# Patient Record
Sex: Female | Born: 1980 | Race: Black or African American | Hispanic: No | Smoking: Never smoker
Health system: Southern US, Community
[De-identification: ages and names within clinical notes are randomized; demographics above are authoritative.]

## PROBLEM LIST (undated history)

## (undated) DIAGNOSIS — Z801 Family history of malignant neoplasm of trachea, bronchus and lung: Secondary | ICD-10-CM

## (undated) DIAGNOSIS — M7989 Other specified soft tissue disorders: Secondary | ICD-10-CM

## (undated) DIAGNOSIS — J45909 Unspecified asthma, uncomplicated: Secondary | ICD-10-CM

## (undated) DIAGNOSIS — T783XXA Angioneurotic edema, initial encounter: Secondary | ICD-10-CM

## (undated) DIAGNOSIS — F419 Anxiety disorder, unspecified: Secondary | ICD-10-CM

## (undated) DIAGNOSIS — G43909 Migraine, unspecified, not intractable, without status migrainosus: Secondary | ICD-10-CM

## (undated) DIAGNOSIS — F329 Major depressive disorder, single episode, unspecified: Secondary | ICD-10-CM

## (undated) DIAGNOSIS — N649 Disorder of breast, unspecified: Secondary | ICD-10-CM

## (undated) DIAGNOSIS — Z803 Family history of malignant neoplasm of breast: Secondary | ICD-10-CM

## (undated) DIAGNOSIS — E739 Lactose intolerance, unspecified: Secondary | ICD-10-CM

## (undated) DIAGNOSIS — Z87448 Personal history of other diseases of urinary system: Secondary | ICD-10-CM

## (undated) DIAGNOSIS — T7840XA Allergy, unspecified, initial encounter: Secondary | ICD-10-CM

## (undated) DIAGNOSIS — M2550 Pain in unspecified joint: Secondary | ICD-10-CM

## (undated) DIAGNOSIS — N19 Unspecified kidney failure: Secondary | ICD-10-CM

## (undated) DIAGNOSIS — L509 Urticaria, unspecified: Secondary | ICD-10-CM

## (undated) DIAGNOSIS — I8393 Asymptomatic varicose veins of bilateral lower extremities: Secondary | ICD-10-CM

## (undated) DIAGNOSIS — K589 Irritable bowel syndrome without diarrhea: Secondary | ICD-10-CM

## (undated) DIAGNOSIS — E8881 Metabolic syndrome: Secondary | ICD-10-CM

## (undated) DIAGNOSIS — K219 Gastro-esophageal reflux disease without esophagitis: Secondary | ICD-10-CM

## (undated) DIAGNOSIS — M170 Bilateral primary osteoarthritis of knee: Secondary | ICD-10-CM

## (undated) DIAGNOSIS — E282 Polycystic ovarian syndrome: Secondary | ICD-10-CM

## (undated) DIAGNOSIS — E88819 Insulin resistance, unspecified: Secondary | ICD-10-CM

## (undated) DIAGNOSIS — Z8051 Family history of malignant neoplasm of kidney: Secondary | ICD-10-CM

## (undated) DIAGNOSIS — F32A Depression, unspecified: Secondary | ICD-10-CM

## (undated) DIAGNOSIS — Z8042 Family history of malignant neoplasm of prostate: Secondary | ICD-10-CM

## (undated) DIAGNOSIS — C50919 Malignant neoplasm of unspecified site of unspecified female breast: Secondary | ICD-10-CM

## (undated) HISTORY — DX: Insulin resistance, unspecified: E88.819

## (undated) HISTORY — DX: Metabolic syndrome: E88.81

## (undated) HISTORY — PX: COSMETIC SURGERY: SHX468

## (undated) HISTORY — DX: Family history of malignant neoplasm of breast: Z80.3

## (undated) HISTORY — DX: Allergy, unspecified, initial encounter: T78.40XA

## (undated) HISTORY — DX: Unspecified asthma, uncomplicated: J45.909

## (undated) HISTORY — DX: Asymptomatic varicose veins of bilateral lower extremities: I83.93

## (undated) HISTORY — PX: ENDOMETRIAL ABLATION: SHX621

## (undated) HISTORY — DX: Polycystic ovarian syndrome: E28.2

## (undated) HISTORY — DX: Family history of malignant neoplasm of kidney: Z80.51

## (undated) HISTORY — DX: Migraine, unspecified, not intractable, without status migrainosus: G43.909

## (undated) HISTORY — DX: Unspecified kidney failure: N19

## (undated) HISTORY — DX: Lactose intolerance, unspecified: E73.9

## (undated) HISTORY — DX: Malignant neoplasm of unspecified site of unspecified female breast: C50.919

## (undated) HISTORY — DX: Bilateral primary osteoarthritis of knee: M17.0

## (undated) HISTORY — DX: Pain in unspecified joint: M25.50

## (undated) HISTORY — PX: PLANTAR FASCIA SURGERY: SHX746

## (undated) HISTORY — DX: Family history of malignant neoplasm of trachea, bronchus and lung: Z80.1

## (undated) HISTORY — DX: Angioneurotic edema, initial encounter: T78.3XXA

## (undated) HISTORY — DX: Urticaria, unspecified: L50.9

## (undated) HISTORY — DX: Family history of malignant neoplasm of prostate: Z80.42

## (undated) HISTORY — DX: Other specified soft tissue disorders: M79.89

## (undated) HISTORY — PX: OTHER SURGICAL HISTORY: SHX169

## (undated) HISTORY — DX: Disorder of breast, unspecified: N64.9

## (undated) HISTORY — DX: Irritable bowel syndrome, unspecified: K58.9

## (undated) HISTORY — PX: REDUCTION MAMMAPLASTY: SUR839

---
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
## 2007-04-24 HISTORY — PX: BREAST LUMPECTOMY: SHX2

## 2007-05-28 ENCOUNTER — Encounter: Admission: RE | Admit: 2007-05-28 | Discharge: 2007-05-28 | Payer: Self-pay | Admitting: Unknown Physician Specialty

## 2007-06-04 ENCOUNTER — Ambulatory Visit: Payer: Self-pay | Admitting: Oncology

## 2007-06-05 LAB — CBC WITH DIFFERENTIAL/PLATELET
BASO%: 0.5 % (ref 0.0–2.0)
EOS%: 3.3 % (ref 0.0–7.0)
HCT: 39 % (ref 34.8–46.6)
MCH: 27.5 pg (ref 26.0–34.0)
MCHC: 33.8 g/dL (ref 32.0–36.0)
NEUT%: 47.2 % (ref 39.6–76.8)
RDW: 12.9 % (ref 11.3–14.5)
lymph#: 2.4 10*3/uL (ref 0.9–3.3)

## 2007-06-05 LAB — COMPREHENSIVE METABOLIC PANEL
ALT: 18 U/L (ref 0–35)
AST: 18 U/L (ref 0–37)
Calcium: 9.6 mg/dL (ref 8.4–10.5)
Chloride: 106 mEq/L (ref 96–112)
Creatinine, Ser: 1.03 mg/dL (ref 0.40–1.20)

## 2007-06-05 LAB — CANCER ANTIGEN 27.29: CA 27.29: 19 U/mL (ref 0–39)

## 2007-06-06 LAB — VITAMIN D 25 HYDROXY (VIT D DEFICIENCY, FRACTURES): Vit D, 25-Hydroxy: 19 ng/mL — ABNORMAL LOW (ref 30–89)

## 2007-06-17 ENCOUNTER — Ambulatory Visit (HOSPITAL_BASED_OUTPATIENT_CLINIC_OR_DEPARTMENT_OTHER): Admission: RE | Admit: 2007-06-17 | Discharge: 2007-06-17 | Payer: Self-pay | Admitting: General Surgery

## 2007-06-17 ENCOUNTER — Encounter (INDEPENDENT_AMBULATORY_CARE_PROVIDER_SITE_OTHER): Payer: Self-pay | Admitting: General Surgery

## 2007-06-22 ENCOUNTER — Ambulatory Visit: Payer: Self-pay | Admitting: Oncology

## 2007-07-25 ENCOUNTER — Ambulatory Visit: Admission: RE | Admit: 2007-07-25 | Discharge: 2007-10-23 | Payer: Self-pay | Admitting: Radiation Oncology

## 2007-09-17 ENCOUNTER — Ambulatory Visit: Payer: Self-pay | Admitting: Oncology

## 2007-10-27 ENCOUNTER — Ambulatory Visit: Payer: Self-pay | Admitting: Oncology

## 2007-10-27 LAB — LACTATE DEHYDROGENASE: LDH: 165 U/L (ref 94–250)

## 2007-10-27 LAB — CBC WITH DIFFERENTIAL/PLATELET
BASO%: 0.7 % (ref 0.0–2.0)
EOS%: 3.3 % (ref 0.0–7.0)
HCT: 38.3 % (ref 34.8–46.6)
MCH: 27.7 pg (ref 26.0–34.0)
MCHC: 34.7 g/dL (ref 32.0–36.0)
MONO#: 0.4 10*3/uL (ref 0.1–0.9)
NEUT%: 53.9 % (ref 39.6–76.8)
RBC: 4.81 10*6/uL (ref 3.70–5.32)
RDW: 12.6 % (ref 11.3–14.5)
WBC: 4 10*3/uL (ref 3.9–10.0)
lymph#: 1.3 10*3/uL (ref 0.9–3.3)

## 2007-10-27 LAB — COMPREHENSIVE METABOLIC PANEL
BUN: 12 mg/dL (ref 6–23)
CO2: 23 mEq/L (ref 19–32)
Calcium: 9 mg/dL (ref 8.4–10.5)
Chloride: 104 mEq/L (ref 96–112)
Creatinine, Ser: 0.97 mg/dL (ref 0.40–1.20)
Glucose, Bld: 91 mg/dL (ref 70–99)

## 2007-11-04 LAB — ESTRADIOL, ULTRA SENS

## 2007-11-25 LAB — LACTATE DEHYDROGENASE: LDH: 148 U/L (ref 94–250)

## 2007-11-25 LAB — CBC WITH DIFFERENTIAL/PLATELET
BASO%: 0.1 % (ref 0.0–2.0)
Eosinophils Absolute: 0.1 10*3/uL (ref 0.0–0.5)
MCHC: 34.4 g/dL (ref 32.0–36.0)
MONO#: 0.4 10*3/uL (ref 0.1–0.9)
NEUT#: 1.9 10*3/uL (ref 1.5–6.5)
Platelets: 336 10*3/uL (ref 145–400)
RBC: 4.64 10*6/uL (ref 3.70–5.32)
WBC: 3.6 10*3/uL — ABNORMAL LOW (ref 3.9–10.0)
lymph#: 1.3 10*3/uL (ref 0.9–3.3)

## 2007-11-25 LAB — FOLLICLE STIMULATING HORMONE: FSH: 5.3 m[IU]/mL

## 2007-11-25 LAB — COMPREHENSIVE METABOLIC PANEL
ALT: 21 U/L (ref 0–35)
BUN: 13 mg/dL (ref 6–23)
CO2: 25 mEq/L (ref 19–32)
Calcium: 9.2 mg/dL (ref 8.4–10.5)
Chloride: 104 mEq/L (ref 96–112)
Creatinine, Ser: 1.01 mg/dL (ref 0.40–1.20)
Total Bilirubin: 1 mg/dL (ref 0.3–1.2)

## 2007-12-06 LAB — ESTRADIOL, ULTRA SENS

## 2007-12-23 ENCOUNTER — Ambulatory Visit: Payer: Self-pay | Admitting: Oncology

## 2008-01-26 LAB — CBC WITH DIFFERENTIAL/PLATELET
BASO%: 0.3 % (ref 0.0–2.0)
Basophils Absolute: 0 10*3/uL (ref 0.0–0.1)
EOS%: 3.4 % (ref 0.0–7.0)
HCT: 36.7 % (ref 34.8–46.6)
HGB: 12.7 g/dL (ref 11.6–15.9)
LYMPH%: 32.6 % (ref 14.0–48.0)
MCH: 27.8 pg (ref 26.0–34.0)
MCHC: 34.7 g/dL (ref 32.0–36.0)
NEUT%: 51.8 % (ref 39.6–76.8)
Platelets: 324 10*3/uL (ref 145–400)

## 2008-01-26 LAB — COMPREHENSIVE METABOLIC PANEL
ALT: 18 U/L (ref 0–35)
AST: 17 U/L (ref 0–37)
BUN: 15 mg/dL (ref 6–23)
CO2: 24 mEq/L (ref 19–32)
Calcium: 9.3 mg/dL (ref 8.4–10.5)
Chloride: 102 mEq/L (ref 96–112)
Creatinine, Ser: 1.03 mg/dL (ref 0.40–1.20)
Total Bilirubin: 0.7 mg/dL (ref 0.3–1.2)

## 2008-01-26 LAB — FOLLICLE STIMULATING HORMONE: FSH: 3 m[IU]/mL

## 2008-01-27 ENCOUNTER — Ambulatory Visit: Payer: Self-pay | Admitting: Vascular Surgery

## 2008-01-27 ENCOUNTER — Encounter: Payer: Self-pay | Admitting: Oncology

## 2008-01-27 ENCOUNTER — Ambulatory Visit: Admission: RE | Admit: 2008-01-27 | Discharge: 2008-01-27 | Payer: Self-pay | Admitting: Oncology

## 2008-02-09 LAB — ESTRADIOL, ULTRA SENS

## 2008-02-19 ENCOUNTER — Ambulatory Visit: Payer: Self-pay | Admitting: Oncology

## 2008-02-24 LAB — CBC WITH DIFFERENTIAL/PLATELET
Basophils Absolute: 0 10*3/uL (ref 0.0–0.1)
Eosinophils Absolute: 0.1 10*3/uL (ref 0.0–0.5)
HCT: 37.9 % (ref 34.8–46.6)
HGB: 12.9 g/dL (ref 11.6–15.9)
LYMPH%: 31 % (ref 14.0–48.0)
MONO#: 0.4 10*3/uL (ref 0.1–0.9)
NEUT#: 2.5 10*3/uL (ref 1.5–6.5)
NEUT%: 57.6 % (ref 39.6–76.8)
Platelets: 339 10*3/uL (ref 145–400)
WBC: 4.4 10*3/uL (ref 3.9–10.0)

## 2008-02-24 LAB — COMPREHENSIVE METABOLIC PANEL
CO2: 25 mEq/L (ref 19–32)
Calcium: 9.2 mg/dL (ref 8.4–10.5)
Creatinine, Ser: 1.01 mg/dL (ref 0.40–1.20)
Glucose, Bld: 88 mg/dL (ref 70–99)
Total Bilirubin: 0.7 mg/dL (ref 0.3–1.2)

## 2008-02-24 LAB — CANCER ANTIGEN 27.29: CA 27.29: 19 U/mL (ref 0–39)

## 2008-03-16 LAB — CBC WITH DIFFERENTIAL/PLATELET
BASO%: 0.4 % (ref 0.0–2.0)
Basophils Absolute: 0 10*3/uL (ref 0.0–0.1)
HCT: 37.7 % (ref 34.8–46.6)
HGB: 13 g/dL (ref 11.6–15.9)
MCHC: 34.5 g/dL (ref 32.0–36.0)
MONO#: 0.5 10*3/uL (ref 0.1–0.9)
NEUT%: 53.6 % (ref 39.6–76.8)
RDW: 13.8 % (ref 11.3–14.5)
WBC: 4.4 10*3/uL (ref 3.9–10.0)
lymph#: 1.4 10*3/uL (ref 0.9–3.3)

## 2008-03-16 LAB — COMPREHENSIVE METABOLIC PANEL
ALT: 18 U/L (ref 0–35)
AST: 18 U/L (ref 0–37)
Albumin: 4.3 g/dL (ref 3.5–5.2)
CO2: 24 mEq/L (ref 19–32)
Calcium: 9.3 mg/dL (ref 8.4–10.5)
Chloride: 103 mEq/L (ref 96–112)
Creatinine, Ser: 0.96 mg/dL (ref 0.40–1.20)
Potassium: 4.4 mEq/L (ref 3.5–5.3)
Total Protein: 7.6 g/dL (ref 6.0–8.3)

## 2008-04-07 ENCOUNTER — Ambulatory Visit: Payer: Self-pay | Admitting: Oncology

## 2008-06-10 ENCOUNTER — Ambulatory Visit: Payer: Self-pay | Admitting: Oncology

## 2008-06-14 ENCOUNTER — Encounter: Admission: RE | Admit: 2008-06-14 | Discharge: 2008-06-14 | Payer: Self-pay | Admitting: Unknown Physician Specialty

## 2008-12-06 ENCOUNTER — Ambulatory Visit: Payer: Self-pay | Admitting: Oncology

## 2008-12-06 LAB — CBC WITH DIFFERENTIAL/PLATELET
BASO%: 0.4 % (ref 0.0–2.0)
Basophils Absolute: 0 10*3/uL (ref 0.0–0.1)
EOS%: 3 % (ref 0.0–7.0)
HCT: 34.2 % — ABNORMAL LOW (ref 34.8–46.6)
HGB: 11.7 g/dL (ref 11.6–15.9)
LYMPH%: 31 % (ref 14.0–49.7)
MCH: 27.5 pg (ref 25.1–34.0)
MCHC: 34.2 g/dL (ref 31.5–36.0)
MCV: 80.5 fL (ref 79.5–101.0)
NEUT%: 52.1 % (ref 38.4–76.8)
Platelets: 345 10*3/uL (ref 145–400)

## 2008-12-07 LAB — CANCER ANTIGEN 27.29: CA 27.29: 20 U/mL (ref 0–39)

## 2008-12-07 LAB — COMPREHENSIVE METABOLIC PANEL
ALT: 17 U/L (ref 0–35)
AST: 24 U/L (ref 0–37)
BUN: 13 mg/dL (ref 6–23)
Calcium: 9.1 mg/dL (ref 8.4–10.5)
Chloride: 106 mEq/L (ref 96–112)
Creatinine, Ser: 0.99 mg/dL (ref 0.40–1.20)
Total Bilirubin: 0.8 mg/dL (ref 0.3–1.2)

## 2008-12-07 LAB — LUTEINIZING HORMONE: LH: 12.8 m[IU]/mL

## 2008-12-07 LAB — VITAMIN D 25 HYDROXY (VIT D DEFICIENCY, FRACTURES): Vit D, 25-Hydroxy: 20 ng/mL — ABNORMAL LOW (ref 30–89)

## 2008-12-15 LAB — ESTRADIOL, ULTRA SENS

## 2009-02-02 ENCOUNTER — Ambulatory Visit: Payer: Self-pay | Admitting: Oncology

## 2009-03-03 ENCOUNTER — Ambulatory Visit: Payer: Self-pay | Admitting: Oncology

## 2009-04-05 ENCOUNTER — Ambulatory Visit: Payer: Self-pay | Admitting: Oncology

## 2009-05-02 ENCOUNTER — Ambulatory Visit: Payer: Self-pay | Admitting: Oncology

## 2009-06-02 ENCOUNTER — Ambulatory Visit: Payer: Self-pay | Admitting: Oncology

## 2009-06-06 LAB — COMPREHENSIVE METABOLIC PANEL
AST: 23 U/L (ref 0–37)
Albumin: 3.6 g/dL (ref 3.5–5.2)
Alkaline Phosphatase: 46 U/L (ref 39–117)
Potassium: 3.5 mEq/L (ref 3.5–5.3)
Sodium: 141 mEq/L (ref 135–145)
Total Protein: 7.5 g/dL (ref 6.0–8.3)

## 2009-06-06 LAB — CBC WITH DIFFERENTIAL/PLATELET
BASO%: 0.4 % (ref 0.0–2.0)
EOS%: 2.5 % (ref 0.0–7.0)
MCH: 26.9 pg (ref 25.1–34.0)
MCHC: 33.4 g/dL (ref 31.5–36.0)
RBC: 4.69 10*6/uL (ref 3.70–5.45)
RDW: 13.6 % (ref 11.2–14.5)
lymph#: 1.8 10*3/uL (ref 0.9–3.3)

## 2009-06-06 LAB — VITAMIN D 25 HYDROXY (VIT D DEFICIENCY, FRACTURES): Vit D, 25-Hydroxy: 23 ng/mL — ABNORMAL LOW (ref 30–89)

## 2009-06-11 LAB — ESTRADIOL, ULTRA SENS

## 2009-06-24 ENCOUNTER — Encounter: Admission: RE | Admit: 2009-06-24 | Discharge: 2009-06-24 | Payer: Self-pay | Admitting: Oncology

## 2009-06-30 ENCOUNTER — Ambulatory Visit: Payer: Self-pay | Admitting: Oncology

## 2009-07-04 ENCOUNTER — Ambulatory Visit (HOSPITAL_COMMUNITY): Admission: RE | Admit: 2009-07-04 | Discharge: 2009-07-04 | Payer: Self-pay | Admitting: Oncology

## 2009-08-02 ENCOUNTER — Ambulatory Visit: Payer: Self-pay | Admitting: Oncology

## 2009-09-01 ENCOUNTER — Ambulatory Visit: Payer: Self-pay | Admitting: Oncology

## 2009-10-11 ENCOUNTER — Ambulatory Visit: Payer: Self-pay | Admitting: Oncology

## 2009-11-28 ENCOUNTER — Ambulatory Visit: Payer: Self-pay | Admitting: Oncology

## 2009-11-30 LAB — COMPREHENSIVE METABOLIC PANEL
AST: 31 U/L (ref 0–37)
BUN: 20 mg/dL (ref 6–23)
CO2: 30 mEq/L (ref 19–32)
Calcium: 9.4 mg/dL (ref 8.4–10.5)
Chloride: 105 mEq/L (ref 96–112)
Creatinine, Ser: 1.21 mg/dL — ABNORMAL HIGH (ref 0.40–1.20)

## 2009-11-30 LAB — LACTATE DEHYDROGENASE: LDH: 148 U/L (ref 94–250)

## 2009-11-30 LAB — CBC WITH DIFFERENTIAL/PLATELET
Basophils Absolute: 0 10*3/uL (ref 0.0–0.1)
EOS%: 1.9 % (ref 0.0–7.0)
HCT: 37.9 % (ref 34.8–46.6)
HGB: 12.8 g/dL (ref 11.6–15.9)
MCH: 27.9 pg (ref 25.1–34.0)
MCV: 82.9 fL (ref 79.5–101.0)
NEUT%: 46.7 % (ref 38.4–76.8)
lymph#: 1.9 10*3/uL (ref 0.9–3.3)

## 2009-12-01 LAB — VITAMIN D 25 HYDROXY (VIT D DEFICIENCY, FRACTURES): Vit D, 25-Hydroxy: 43 ng/mL (ref 30–89)

## 2009-12-10 LAB — ESTRADIOL, ULTRA SENS: Estradiol, Ultra Sensitive: 25 pg/mL

## 2010-01-03 ENCOUNTER — Ambulatory Visit: Payer: Self-pay | Admitting: Oncology

## 2010-02-03 ENCOUNTER — Encounter: Admission: RE | Admit: 2010-02-03 | Discharge: 2010-02-03 | Payer: Self-pay | Admitting: Oncology

## 2010-03-01 ENCOUNTER — Ambulatory Visit: Payer: Self-pay | Admitting: Oncology

## 2010-03-10 LAB — CBC WITH DIFFERENTIAL/PLATELET
Eosinophils Absolute: 0.1 10*3/uL (ref 0.0–0.5)
HCT: 36.4 % (ref 34.8–46.6)
LYMPH%: 42.9 % (ref 14.0–49.7)
MONO#: 0.3 10*3/uL (ref 0.1–0.9)
NEUT#: 1.5 10*3/uL (ref 1.5–6.5)
Platelets: 351 10*3/uL (ref 145–400)
RBC: 4.42 10*6/uL (ref 3.70–5.45)
WBC: 3.4 10*3/uL — ABNORMAL LOW (ref 3.9–10.3)
lymph#: 1.5 10*3/uL (ref 0.9–3.3)

## 2010-03-10 LAB — COMPREHENSIVE METABOLIC PANEL
ALT: 16 U/L (ref 0–35)
AST: 19 U/L (ref 0–37)
Albumin: 3.6 g/dL (ref 3.5–5.2)
CO2: 28 mEq/L (ref 19–32)
Calcium: 9.4 mg/dL (ref 8.4–10.5)
Chloride: 106 mEq/L (ref 96–112)
Creatinine, Ser: 1.06 mg/dL (ref 0.40–1.20)
Potassium: 4.2 mEq/L (ref 3.5–5.3)
Total Protein: 7.4 g/dL (ref 6.0–8.3)

## 2010-03-10 LAB — LACTATE DEHYDROGENASE: LDH: 137 U/L (ref 94–250)

## 2010-03-11 LAB — FOLLICLE STIMULATING HORMONE: FSH: 4.7 m[IU]/mL

## 2010-05-14 ENCOUNTER — Encounter: Payer: Self-pay | Admitting: Unknown Physician Specialty

## 2010-06-28 ENCOUNTER — Other Ambulatory Visit: Payer: Self-pay | Admitting: Oncology

## 2010-06-28 DIAGNOSIS — Z9889 Other specified postprocedural states: Secondary | ICD-10-CM

## 2010-07-12 ENCOUNTER — Ambulatory Visit (INDEPENDENT_AMBULATORY_CARE_PROVIDER_SITE_OTHER): Payer: 59 | Admitting: Psychology

## 2010-07-12 DIAGNOSIS — F988 Other specified behavioral and emotional disorders with onset usually occurring in childhood and adolescence: Secondary | ICD-10-CM

## 2010-08-02 ENCOUNTER — Encounter (HOSPITAL_COMMUNITY): Payer: 59 | Admitting: Psychology

## 2010-09-05 NOTE — Op Note (Signed)
NAME:  Adriana Moon, Adriana Moon          ACCOUNT NO.:  [id number]   MEDICAL RECORD NO.:  [id number]          PATIENT TYPE:  AMB   LOCATION:  DSC                          FACILITY:  [facility]   PHYSICIAN:  Lennie Muckle, MD      DATE OF BIRTH:  [birth year]   DATE OF PROCEDURE:  06/17/2007  DATE OF DISCHARGE:                               OPERATIVE REPORT   PREOPERATIVE DIAGNOSIS:  Right breast cancer.   POSTOPERATIVE DIAGNOSIS:  Right breast cancer.   PROCEDURE:  Right breast lumpectomy with sentinel node dissection.   SURGEON:  Lennie Muckle, M.D.   ASSISTANT:  None.   ANESTHESIA:  General endotracheal anesthesia.   INDICATIONS FOR PROCEDURE:  Adriana Moon is a 30 year old female who  self-detected a right breast mass.  A core biopsy revealed a right  breast cancer.  It was discussed with the patient to perform a  lumpectomy as well as a sentinel lymph node for final pathology, to  coordinate her treatment regimen.  An informed consent was obtained  prior to the procedure.   DESCRIPTION OF PROCEDURE:  Adriana Moon was identified in the  preoperative holding suite.  Her operative site was marked.  She was  also injected with a radio tracer in the holding area.  She was then  taken to the operating room and placed in a supine position.  After the  administration of general endotracheal anesthesia, her right breast and  axilla were prepped and draped in the usual sterile fashion.  A time out  in the procedure indicating the patient and the procedure was performed.  Using 3 mL of methylene blue, I injected just beneath the area outlined,  to the vicinity of the mass.  The area was massaged for approximately  five minutes.  Using the probe in the area of the axilla was marked.  The skin was incised with a #15 blade.  The subcutaneous tissue was  divided with electrocautery.  Several lymph nodes were able to be  identified using the methylene blue and the radioactive material.  Approximately eight nodes were dissected.  The #1 node was noted to be  1977, was blue and hot.  The lymph node #2 was 212 and was also blue.  Lymph node #3 was 58 and lymph node #4 was 1607 and was also hot and  blue.  Lymph node #5 was 317, lymph node #6 was 775, lymph node was  #287, lymph node #8 was 165.  The axilla count was less than 150 on the  endo device.  The axilla was then irrigated.  No evidence of bleeding.  This was closed in an interrupted fashion using #3-0 Vicryl sutures.  The skin was closed with #4-0 Monocryl.  Steri-Strips were placed as a  final dressing.   I then turned my attention to the right breast mass.  This was easily  palpated in the lateral aspect of her breast.  Marking the incision  directly over this vicinity, I placed a lateral incision.  The  subcutaneous tissues were divided with electrocautery.  I then  encompassed the mass in the lumpectomy using  electrocautery to maintain  hemostasis.  I marked the anterior portion with a double short suture.  A single was marked superior and lateral with a long suture.  Once the  specimen was turned loose from the breast, it was passed off the  operative field.  The breast was then irrigated.  The bleeding was  controlled with electrocautery.  After irrigation and further inspecting  the breast tissue, there was no evidence of bleeding.  It was then  closed in an interrupted fashion using #3-0 Vicryl suture.  The skin was  closed with #4-0 Monocryl.  Steri-Strips were placed and a final  dressing.  Approximately 30 mL of 0.25% Marcaine were anesthetized into  the axilla and the breast.   The patient was then extubated and transported to the post-anesthesia  care unit in stable condition.   DISPOSITION:  She will come to see me in approximately two or three  weeks.  I will call with her pathology prior to that.      Lennie Muckle, MD  Electronically Signed     ALA/MEDQ  D:  06/17/2007  T:  06/17/2007   Job:  [id number]

## 2011-01-15 LAB — COMPREHENSIVE METABOLIC PANEL
AST: 27
Albumin: 3.9
Calcium: 9
Creatinine, Ser: 0.96
GFR calc Af Amer: >60

## 2011-01-15 LAB — URINALYSIS, ROUTINE W REFLEX MICROSCOPIC
Bilirubin Urine: NEGATIVE
Glucose, UA: NEGATIVE
Ketones, ur: NEGATIVE
Leukocytes, UA: NEGATIVE
pH: 6

## 2011-01-15 LAB — CBC
Hemoglobin: 13.2
MCHC: 34.1
RBC: 4.71
WBC: 5.5

## 2011-01-15 LAB — DIFFERENTIAL
Eosinophils Relative: 1
Lymphocytes Relative: 29
Lymphs Abs: 1.6
Monocytes Absolute: 0.5
Monocytes Relative: 10

## 2011-01-15 LAB — URINE MICROSCOPIC-ADD ON

## 2011-09-05 ENCOUNTER — Other Ambulatory Visit: Payer: Self-pay | Admitting: Oncology

## 2011-09-05 DIAGNOSIS — Z9889 Other specified postprocedural states: Secondary | ICD-10-CM

## 2011-09-05 DIAGNOSIS — Z853 Personal history of malignant neoplasm of breast: Secondary | ICD-10-CM

## 2011-12-07 ENCOUNTER — Other Ambulatory Visit: Payer: Self-pay | Admitting: Oncology

## 2011-12-07 ENCOUNTER — Ambulatory Visit
Admission: RE | Admit: 2011-12-07 | Discharge: 2011-12-07 | Disposition: A | Discharge status: Home / Self Care | Payer: 59 | Source: Ambulatory Visit | Attending: Oncology | Admitting: Oncology

## 2011-12-07 DIAGNOSIS — Z9889 Other specified postprocedural states: Secondary | ICD-10-CM

## 2011-12-07 DIAGNOSIS — Z853 Personal history of malignant neoplasm of breast: Secondary | ICD-10-CM

## 2012-09-16 ENCOUNTER — Other Ambulatory Visit: Payer: Self-pay | Admitting: Unknown Physician Specialty

## 2012-09-16 DIAGNOSIS — Z853 Personal history of malignant neoplasm of breast: Secondary | ICD-10-CM

## 2012-11-27 ENCOUNTER — Encounter: Payer: Self-pay | Admitting: Gastroenterology

## 2012-12-24 ENCOUNTER — Ambulatory Visit: Payer: 59 | Admitting: Gastroenterology

## 2013-01-14 ENCOUNTER — Ambulatory Visit
Admission: RE | Admit: 2013-01-14 | Discharge: 2013-01-14 | Disposition: A | Discharge status: Home / Self Care | Payer: 59 | Source: Ambulatory Visit | Attending: Unknown Physician Specialty | Admitting: Unknown Physician Specialty

## 2013-01-14 DIAGNOSIS — Z853 Personal history of malignant neoplasm of breast: Secondary | ICD-10-CM

## 2013-02-05 ENCOUNTER — Telehealth: Payer: Self-pay | Admitting: Gastroenterology

## 2013-02-05 ENCOUNTER — Ambulatory Visit: Payer: 59 | Admitting: Gastroenterology

## 2013-02-05 NOTE — Telephone Encounter (Signed)
Pt was a no show

## 2014-06-10 ENCOUNTER — Other Ambulatory Visit: Payer: Self-pay

## 2014-06-10 DIAGNOSIS — Z1231 Encounter for screening mammogram for malignant neoplasm of breast: Secondary | ICD-10-CM

## 2014-06-24 ENCOUNTER — Other Ambulatory Visit: Payer: Self-pay

## 2014-06-24 ENCOUNTER — Ambulatory Visit
Admission: RE | Admit: 2014-06-24 | Discharge: 2014-06-24 | Disposition: A | Discharge status: Home / Self Care | Payer: BLUE CROSS/BLUE SHIELD | Source: Ambulatory Visit

## 2014-06-24 DIAGNOSIS — Z9889 Other specified postprocedural states: Secondary | ICD-10-CM

## 2014-06-24 DIAGNOSIS — Z1231 Encounter for screening mammogram for malignant neoplasm of breast: Secondary | ICD-10-CM

## 2014-09-10 ENCOUNTER — Other Ambulatory Visit: Payer: Self-pay | Admitting: Adult Health

## 2014-09-28 ENCOUNTER — Other Ambulatory Visit: Payer: Self-pay | Admitting: Adult Health

## 2014-10-20 ENCOUNTER — Other Ambulatory Visit: Payer: Self-pay | Admitting: Adult Health

## 2014-10-22 ENCOUNTER — Ambulatory Visit (INDEPENDENT_AMBULATORY_CARE_PROVIDER_SITE_OTHER): Payer: BLUE CROSS/BLUE SHIELD | Admitting: Obstetrics & Gynecology

## 2014-10-22 ENCOUNTER — Other Ambulatory Visit (HOSPITAL_COMMUNITY)
Admission: RE | Admit: 2014-10-22 | Discharge: 2014-10-22 | Disposition: A | Discharge status: Home / Self Care | Payer: BLUE CROSS/BLUE SHIELD | Source: Ambulatory Visit | Attending: Obstetrics & Gynecology | Admitting: Obstetrics & Gynecology

## 2014-10-22 ENCOUNTER — Encounter: Payer: Self-pay | Admitting: Obstetrics & Gynecology

## 2014-10-22 VITALS — BP 108/60 | HR 72 | Ht 60.0 in | Wt 270.4 lb

## 2014-10-22 DIAGNOSIS — Z01419 Encounter for gynecological examination (general) (routine) without abnormal findings: Secondary | ICD-10-CM

## 2014-10-22 DIAGNOSIS — Z1151 Encounter for screening for human papillomavirus (HPV): Secondary | ICD-10-CM | POA: Insufficient documentation

## 2014-10-22 NOTE — Progress Notes (Signed)
Patient ID: Adriana Moon, female   DOB: 15-Jan-1981, 34 y.o.   MRN: [id number] Subjective:     Adriana Moon is a 34 y.o. female here for a routine exam.  Patient's last menstrual period was 09/26/2014. No obstetric history on file. Birth Control Method:  Ablation otherwise negative Menstrual Calendar(currently): regular  Current complaints: none.   Current acute medical issues:  Breast cancer 2011   Recent Gynecologic History Patient's last menstrual period was 09/26/2014. Last Pap: 2015,  normal Last mammogram: 2016,  normal  Past Medical History  Diagnosis Date  . Arthritis of both knees   breast cancer  Past Surgical History  Procedure Laterality Date  . Cesarean section    . Rt lumpectomy      OB History    No data available      History   Social History  . Marital Status: Legally Separated    Spouse Name: N/A  . Number of Children: N/A  . Years of Education: N/A   Social History Main Topics  . 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 Smokeless tobacco: Not on file  . Alcohol Use: Not on file  . Drug Use: Not on file  . Sexual Activity: Not Currently   Other Topics Concern  . None   Social History Narrative  . None    Family History  Problem Relation Age of Onset  . Diabetes Father   . Hypertension Father      Current outpatient prescriptions:  .  cetirizine (ZYRTEC) 10 MG tablet, Take 10 mg by mouth daily., Disp: , Rfl:  .  pantoprazole (PROTONIX) 20 MG tablet, Take 20 mg by mouth daily., Disp: , Rfl:   Review of Systems  Review of Systems  Constitutional: Negative for fever, chills, weight loss, malaise/fatigue and diaphoresis.  HENT: Negative for hearing loss, ear pain, nosebleeds, congestion, sore throat, neck pain, tinnitus and ear discharge.   Eyes: Negative for blurred vision, double vision, photophobia, pain, discharge and redness.  Respiratory: Negative for cough, hemoptysis, sputum production, shortness of breath, wheezing and  stridor.   Cardiovascular: Negative for chest pain, palpitations, orthopnea, claudication, leg swelling and PND.  Gastrointestinal: negative for abdominal pain. Negative for heartburn, nausea, vomiting, diarrhea, constipation, blood in stool and melena.  Genitourinary: Negative for dysuria, urgency, frequency, hematuria and flank pain.  Musculoskeletal: Negative for myalgias, back pain, joint pain and falls.  Skin: Negative for itching and rash.  Neurological: Negative for dizziness, tingling, tremors, sensory change, speech change, focal weakness, seizures, loss of consciousness, weakness and headaches.  Endo/Heme/Allergies: Negative for environmental allergies and polydipsia. Does not bruise/bleed easily.  Psychiatric/Behavioral: Negative for depression, suicidal ideas, hallucinations, memory loss and substance abuse. The patient is not nervous/anxious and does not have insomnia.        Objective:  Blood pressure 108/60, pulse 72, height 5' (1.524 m), weight 270 lb 6.4 oz (122.653 kg), last menstrual period 09/26/2014.   Physical Exam  Vitals reviewed. Constitutional: She is oriented to person, place, and time. She appears well-developed and well-nourished.  HENT:  Head: Normocephalic and atraumatic.        Right Ear: External ear normal.  Left Ear: External ear normal.  Nose: Nose normal.  Mouth/Throat: Oropharynx is clear and moist.  Eyes: Conjunctivae and EOM are normal. Pupils are equal, round, and reactive to light. Right eye exhibits no discharge. Left eye exhibits no discharge. No scleral icterus.  Neck: Normal range of motion. Neck supple. No tracheal deviation present. No  thyromegaly present.  Cardiovascular: Normal rate, regular rhythm, normal heart sounds and intact distal pulses.  Exam reveals no gallop and no friction rub.   No murmur heard. Respiratory: Effort normal and breath sounds normal. No respiratory distress. She has no wheezes. She has no rales. She exhibits no  tenderness.  GI: Soft. Bowel sounds are normal. She exhibits no distension and no mass. There is no tenderness. There is no rebound and no guarding.  Genitourinary:  Breasts no masses skin changes or nipple changes bilaterally      Vulva is normal without lesions Vagina is pink moist without discharge Cervix normal in appearance and pap is done Uterus is normal size shape and contour Adnexa is negative with normal sized ovaries   Musculoskeletal: Normal range of motion. She exhibits no edema and no tenderness.  Neurological: She is alert and oriented to person, place, and time. She has normal reflexes. She displays normal reflexes. No cranial nerve deficit. She exhibits normal muscle tone. Coordination normal.  Skin: Skin is warm and dry. No rash noted. No erythema. No pallor.  Psychiatric: She has a normal mood and affect. Her behavior is normal. Judgment and thought content normal.       Assessment:    Healthy female exam.   history of breast cancer Plan:    Follow up in: 1 year.

## 2014-10-26 LAB — CYTOLOGY - PAP

## 2015-01-11 ENCOUNTER — Ambulatory Visit (INDEPENDENT_AMBULATORY_CARE_PROVIDER_SITE_OTHER): Payer: BLUE CROSS/BLUE SHIELD | Admitting: Advanced Practice Midwife

## 2015-01-11 ENCOUNTER — Encounter: Payer: Self-pay | Admitting: Advanced Practice Midwife

## 2015-01-11 VITALS — BP 112/70 | Ht 60.0 in | Wt 272.0 lb

## 2015-01-11 DIAGNOSIS — N898 Other specified noninflammatory disorders of vagina: Secondary | ICD-10-CM | POA: Diagnosis not present

## 2015-01-12 NOTE — Progress Notes (Signed)
[facility] Visit  Patient name: Adriana Moon MRN [id number]  Date of birth: 1980-08-14  CC & HPI:  Adriana Moon is a 34 y.o. African American female presenting today for c/o increased vaginal discharge for 2 days.  States that in the am "it looks like sour cream if I spread the lips".  No odor, itch, or irritaion.  Has douched a few times using apple cider vinegar.   Pertinent History Reviewed:  Medical & Surgical Hx:   Past Medical History  Diagnosis Date  . Arthritis of both knees   . Migraines   . Breast cancer    Past Surgical History  Procedure Laterality Date  . Cesarean section    . Rt lumpectomy     Family History  Problem Relation Age of Onset  . Diabetes Father   . Hypertension Father   . Gout Father     Current outpatient prescriptions:  .  cetirizine (ZYRTEC) 10 MG tablet, Take 10 mg by mouth daily., Disp: , Rfl:  .  pantoprazole (PROTONIX) 20 MG tablet, Take 20 mg by mouth daily., Disp: , Rfl:  Social History: Reviewed -  reports that she has never smoked. She has never used smokeless tobacco.  Review of Systems:   Constitutional: Negative for fever and chills Eyes: Negative for visual disturbances Respiratory: Negative for shortness of breath, dyspnea Cardiovascular: Negative for chest pain or palpitations  Gastrointestinal: Negative for vomiting, diarrhea and constipation; no abdominal pain Genitourinary: Negative for dysuria and urgency, vaginal irritation or itching Musculoskeletal: Negative for back pain, joint pain, myalgias  Neurological: Negative for dizziness and headaches    Objective Findings:  Vitals: BP 112/70 mmHg  Ht 5' (1.524 m)  Wt 272 lb (123.378 kg)  BMI 53.12 kg/m2  LMP 12/29/2014  Physical Examination:  General appearance - alert, well appearing, and in no distress Mental status - alert, oriented to person, place, and time Pelvic - Vulva normal. SSE: vaginal sidewalls normal, no erythema.  Cervix non  friable.  Scant amount of normal appearing vaginal discharge.  No odor.  Wet prep completely negative. No lactobaclli.  Musculoskeletal - no joint tenderness, deformity or swelling, full range of motion without pain Extremities - no pedal edema noted Heart:  Regular rate and rhythm Chest:  Normal respiratory effort  No results found for this or any previous visit (from the past 24 hour(s)).       Assessment & Plan:  A:   Non infectious discharge P:  RepHresh vaginal gel for 1 week.  Quit douching.   F/U prn problems    CRESENZO-DISHMAN,FRANCES CNM 01/12/2015 11:39 AM

## 2015-01-25 ENCOUNTER — Ambulatory Visit (HOSPITAL_BASED_OUTPATIENT_CLINIC_OR_DEPARTMENT_OTHER): Payer: BLUE CROSS/BLUE SHIELD | Admitting: Anesthesiology

## 2015-01-25 ENCOUNTER — Encounter (HOSPITAL_BASED_OUTPATIENT_CLINIC_OR_DEPARTMENT_OTHER): Payer: Self-pay

## 2015-01-25 ENCOUNTER — Ambulatory Visit (HOSPITAL_BASED_OUTPATIENT_CLINIC_OR_DEPARTMENT_OTHER)
Admission: RE | Admit: 2015-01-25 | Discharge: 2015-01-25 | Disposition: A | Discharge status: Home / Self Care | Payer: BLUE CROSS/BLUE SHIELD | Source: Ambulatory Visit | Attending: Plastic Surgery | Admitting: Plastic Surgery

## 2015-01-25 ENCOUNTER — Encounter (HOSPITAL_BASED_OUTPATIENT_CLINIC_OR_DEPARTMENT_OTHER)
Admission: RE | Disposition: A | Discharge status: Home / Self Care | Payer: Self-pay | Source: Ambulatory Visit | Attending: Plastic Surgery

## 2015-01-25 DIAGNOSIS — Z853 Personal history of malignant neoplasm of breast: Secondary | ICD-10-CM | POA: Diagnosis not present

## 2015-01-25 DIAGNOSIS — N62 Hypertrophy of breast: Secondary | ICD-10-CM | POA: Diagnosis not present

## 2015-01-25 HISTORY — PX: BREAST REDUCTION SURGERY: SHX8

## 2015-01-25 LAB — POCT HEMOGLOBIN-HEMACUE: HEMOGLOBIN: 15.2 g/dL — AB (ref 12.0–15.0)

## 2015-01-25 SURGERY — MAMMOPLASTY, REDUCTION
Anesthesia: General | Site: Breast | Laterality: Bilateral

## 2015-01-25 MED ORDER — ONDANSETRON HCL 4 MG/2ML IJ SOLN
INTRAMUSCULAR | Status: DC | PRN
  Administered 2015-01-25 (×2): 4 mg via INTRAVENOUS

## 2015-01-25 MED ORDER — LIDOCAINE-EPINEPHRINE 1 %-1:100000 IJ SOLN
INTRAMUSCULAR | Status: AC
  Filled 2015-01-25: qty 2

## 2015-01-25 MED ORDER — SUCCINYLCHOLINE CHLORIDE 20 MG/ML IJ SOLN
INTRAMUSCULAR | Status: DC | PRN
  Administered 2015-01-25: 100 mg via INTRAVENOUS

## 2015-01-25 MED ORDER — FENTANYL CITRATE (PF) 100 MCG/2ML IJ SOLN
INTRAMUSCULAR | Status: AC
  Filled 2015-01-25: qty 4

## 2015-01-25 MED ORDER — 0.9 % SODIUM CHLORIDE (POUR BTL) OPTIME
TOPICAL | Status: DC | PRN
  Administered 2015-01-25: 1800 mL

## 2015-01-25 MED ORDER — HYDROMORPHONE HCL 1 MG/ML IJ SOLN
INTRAMUSCULAR | Status: AC
  Filled 2015-01-25: qty 1

## 2015-01-25 MED ORDER — MIDAZOLAM HCL 2 MG/2ML IJ SOLN
1.0000 mg | INTRAMUSCULAR | Status: DC | PRN
Start: 2015-01-25 — End: 2015-01-25
  Administered 2015-01-25: 2 mg via INTRAVENOUS

## 2015-01-25 MED ORDER — PHENYLEPHRINE 40 MCG/ML (10ML) SYRINGE FOR IV PUSH (FOR BLOOD PRESSURE SUPPORT)
PREFILLED_SYRINGE | INTRAVENOUS | Status: AC
  Filled 2015-01-25: qty 10

## 2015-01-25 MED ORDER — DEXAMETHASONE SODIUM PHOSPHATE 10 MG/ML IJ SOLN
INTRAMUSCULAR | Status: AC
  Filled 2015-01-25: qty 1

## 2015-01-25 MED ORDER — BUPIVACAINE LIPOSOME 1.3 % IJ SUSP
INTRAMUSCULAR | Status: AC
Start: 2015-01-25 — End: 2015-01-25
  Filled 2015-01-25: qty 20

## 2015-01-25 MED ORDER — PHENYLEPHRINE HCL 10 MG/ML IJ SOLN
INTRAMUSCULAR | Status: DC | PRN
  Administered 2015-01-25: 40 ug via INTRAVENOUS

## 2015-01-25 MED ORDER — PROMETHAZINE HCL 25 MG/ML IJ SOLN
INTRAMUSCULAR | Status: AC
  Filled 2015-01-25: qty 1

## 2015-01-25 MED ORDER — HYDROMORPHONE HCL 1 MG/ML IJ SOLN
0.2500 mg | INTRAMUSCULAR | Status: DC | PRN
  Administered 2015-01-25 (×3): 0.5 mg via INTRAVENOUS

## 2015-01-25 MED ORDER — ONDANSETRON HCL 4 MG/2ML IJ SOLN
INTRAMUSCULAR | Status: AC
  Filled 2015-01-25: qty 4

## 2015-01-25 MED ORDER — ONDANSETRON HCL 4 MG/2ML IJ SOLN
INTRAMUSCULAR | Status: AC
  Filled 2015-01-25: qty 2

## 2015-01-25 MED ORDER — MIDAZOLAM HCL 2 MG/2ML IJ SOLN
INTRAMUSCULAR | Status: AC
  Filled 2015-01-25: qty 4

## 2015-01-25 MED ORDER — GLYCOPYRROLATE 0.2 MG/ML IJ SOLN
INTRAMUSCULAR | Status: AC
Start: 2015-01-25 — End: 2015-01-25
  Filled 2015-01-25: qty 1

## 2015-01-25 MED ORDER — SUCCINYLCHOLINE CHLORIDE 20 MG/ML IJ SOLN
INTRAMUSCULAR | Status: AC
Start: 2015-01-25 — End: 2015-01-25
  Filled 2015-01-25: qty 1

## 2015-01-25 MED ORDER — CEFAZOLIN SODIUM-DEXTROSE 2-3 GM-% IV SOLR
INTRAVENOUS | Status: AC
  Filled 2015-01-25: qty 50

## 2015-01-25 MED ORDER — PROMETHAZINE HCL 25 MG/ML IJ SOLN
6.2500 mg | INTRAMUSCULAR | Status: DC | PRN
  Administered 2015-01-25: 6.25 mg via INTRAVENOUS

## 2015-01-25 MED ORDER — PROPOFOL 10 MG/ML IV BOLUS
INTRAVENOUS | Status: DC | PRN
  Administered 2015-01-25: 200 mg via INTRAVENOUS

## 2015-01-25 MED ORDER — CEFAZOLIN SODIUM 1-5 GM-% IV SOLN: 1.0000 g | Freq: Once | INTRAVENOUS | Status: DC

## 2015-01-25 MED ORDER — OXYCODONE HCL 5 MG/5ML PO SOLN: 5.0000 mg | Freq: Once | ORAL | Status: DC | PRN

## 2015-01-25 MED ORDER — SODIUM CHLORIDE 0.9 % IV SOLN
INTRAVENOUS | Status: DC | PRN
  Administered 2015-01-25: 100 mL

## 2015-01-25 MED ORDER — LACTATED RINGERS IV SOLN
INTRAVENOUS | Status: DC
  Administered 2015-01-25 (×3): via INTRAVENOUS

## 2015-01-25 MED ORDER — FENTANYL CITRATE (PF) 100 MCG/2ML IJ SOLN
50.0000 ug | INTRAMUSCULAR | Status: AC | PRN
  Administered 2015-01-25 (×12): 25 ug via INTRAVENOUS
  Administered 2015-01-25: 100 ug via INTRAVENOUS

## 2015-01-25 MED ORDER — BACITRACIN ZINC 500 UNIT/GM EX OINT
TOPICAL_OINTMENT | CUTANEOUS | Status: AC
  Filled 2015-01-25: qty 28.35

## 2015-01-25 MED ORDER — DEXAMETHASONE SODIUM PHOSPHATE 4 MG/ML IJ SOLN
INTRAMUSCULAR | Status: DC | PRN
  Administered 2015-01-25: 10 mg via INTRAVENOUS

## 2015-01-25 MED ORDER — BACITRACIN ZINC 500 UNIT/GM EX OINT
TOPICAL_OINTMENT | CUTANEOUS | Status: DC | PRN
  Administered 2015-01-25: 1 via TOPICAL

## 2015-01-25 MED ORDER — LIDOCAINE-EPINEPHRINE 1 %-1:100000 IJ SOLN
INTRAMUSCULAR | Status: DC | PRN
  Administered 2015-01-25: 40 mL

## 2015-01-25 MED ORDER — ARTIFICIAL TEARS OP OINT
TOPICAL_OINTMENT | OPHTHALMIC | Status: AC
  Filled 2015-01-25: qty 3.5

## 2015-01-25 MED ORDER — PROPOFOL 10 MG/ML IV BOLUS
INTRAVENOUS | Status: AC
  Filled 2015-01-25: qty 20

## 2015-01-25 MED ORDER — SCOPOLAMINE 1 MG/3DAYS TD PT72: 1.0000 | MEDICATED_PATCH | Freq: Once | TRANSDERMAL | Status: DC | PRN

## 2015-01-25 MED ORDER — OXYCODONE HCL 5 MG PO TABS: 5.0000 mg | ORAL_TABLET | Freq: Once | ORAL | Status: DC | PRN

## 2015-01-25 MED ORDER — LIDOCAINE HCL (CARDIAC) 20 MG/ML IV SOLN
INTRAVENOUS | Status: AC
Start: 2015-01-25 — End: 2015-01-25
  Filled 2015-01-25: qty 5

## 2015-01-25 MED ORDER — GLYCOPYRROLATE 0.2 MG/ML IJ SOLN
0.2000 mg | Freq: Once | INTRAMUSCULAR | Status: AC | PRN
  Administered 2015-01-25: 0.2 mg via INTRAVENOUS

## 2015-01-25 MED ORDER — LIDOCAINE HCL (CARDIAC) 10 MG/ML IV SOLN
INTRAVENOUS | Status: DC | PRN
  Administered 2015-01-25: 100 mg via INTRAVENOUS

## 2015-01-25 MED ORDER — CEFAZOLIN SODIUM-DEXTROSE 2-3 GM-% IV SOLR
INTRAVENOUS | Status: DC | PRN
  Administered 2015-01-25: 2 g via INTRAVENOUS

## 2015-01-25 SURGICAL SUPPLY — 62 items
BAG DECANTER FOR FLEXI CONT (MISCELLANEOUS) ×2 IMPLANT
BENZOIN TINCTURE PRP APPL 2/3 (GAUZE/BANDAGES/DRESSINGS) ×4 IMPLANT
BLADE KNIFE PERSONA 10 (BLADE) ×8 IMPLANT
BLADE KNIFE PERSONA 15 (BLADE) ×6 IMPLANT
BNDG GAUZE ELAST 4 BULKY (GAUZE/BANDAGES/DRESSINGS) ×4 IMPLANT
CANISTER SUCT 1200ML W/VALVE (MISCELLANEOUS) ×4 IMPLANT
CAP BOUFFANT 24 BLUE NURSES (PROTECTIVE WEAR) ×2 IMPLANT
COVER BACK TABLE 60X90IN (DRAPES) ×2 IMPLANT
COVER MAYO STAND STRL (DRAPES) ×2 IMPLANT
DECANTER SPIKE VIAL GLASS SM (MISCELLANEOUS) ×4 IMPLANT
DRAIN CHANNEL 10F 3/8 F FF (DRAIN) ×4 IMPLANT
DRAPE LAPAROSCOPIC ABDOMINAL (DRAPES) IMPLANT
DRAPE U-SHAPE 76X120 STRL (DRAPES) IMPLANT
DRSG EMULSION OIL 3X3 NADH (GAUZE/BANDAGES/DRESSINGS) ×4 IMPLANT
DRSG PAD ABDOMINAL 8X10 ST (GAUZE/BANDAGES/DRESSINGS) ×4 IMPLANT
ELECT REM PT RETURN 9FT ADLT (ELECTROSURGICAL) ×2
ELECTRODE REM PT RTRN 9FT ADLT (ELECTROSURGICAL) ×1 IMPLANT
EVACUATOR SILICONE 100CC (DRAIN) ×4 IMPLANT
FILTER 7/8 IN (FILTER) ×2 IMPLANT
GAUZE SPONGE 4X4 12PLY STRL (GAUZE/BANDAGES/DRESSINGS) ×4 IMPLANT
GLOVE BIO SURGEON STRL SZ7 (GLOVE) ×2 IMPLANT
GLOVE BIOGEL PI IND STRL 7.0 (GLOVE) ×3 IMPLANT
GLOVE BIOGEL PI INDICATOR 7.0 (GLOVE) ×3
GLOVE ECLIPSE 6.5 STRL STRAW (GLOVE) ×8 IMPLANT
GLOVE EXAM NITRILE LRG STRL (GLOVE) ×2 IMPLANT
GOWN STRL REUS W/ TWL LRG LVL3 (GOWN DISPOSABLE) ×2 IMPLANT
GOWN STRL REUS W/TWL LRG LVL3 (GOWN DISPOSABLE) ×2
IV NS 250ML (IV SOLUTION) ×1
IV NS 250ML BAXH (IV SOLUTION) ×1 IMPLANT
NDL SAFETY ECLIPSE 18X1.5 (NEEDLE) ×1 IMPLANT
NEEDLE HYPO 18GX1.5 SHARP (NEEDLE) ×1
NEEDLE HYPO 25X1 1.5 SAFETY (NEEDLE) ×6 IMPLANT
NEEDLE SPNL 18GX3.5 QUINCKE PK (NEEDLE) ×2 IMPLANT
NS IRRIG 1000ML POUR BTL (IV SOLUTION) ×4 IMPLANT
PACK BASIN DAY SURGERY FS (CUSTOM PROCEDURE TRAY) ×2 IMPLANT
PACK UNIVERSAL I (CUSTOM PROCEDURE TRAY) ×2 IMPLANT
PIN SAFETY STERILE (MISCELLANEOUS) ×2 IMPLANT
SCRUB PCMX 4 OZ (MISCELLANEOUS) ×2 IMPLANT
SLEEVE SCD COMPRESS KNEE MED (MISCELLANEOUS) ×2 IMPLANT
SPECIMEN JAR MEDIUM (MISCELLANEOUS) ×4 IMPLANT
SPECIMEN JAR X LARGE (MISCELLANEOUS) IMPLANT
SPONGE LAP 18X18 X RAY DECT (DISPOSABLE) ×8 IMPLANT
STAPLER VISISTAT 35W (STAPLE) ×4 IMPLANT
STRIP CLOSURE SKIN 1/2X4 (GAUZE/BANDAGES/DRESSINGS) ×8 IMPLANT
SUT ETHILON 3 0 PS 1 (SUTURE) ×2 IMPLANT
SUT MNCRL AB 3-0 PS2 18 (SUTURE) ×12 IMPLANT
SUT MNCRL AB 4-0 PS2 18 (SUTURE) ×4 IMPLANT
SUT MON AB 5-0 PS2 18 (SUTURE) ×4 IMPLANT
SUT PROLENE 2 0 CT2 30 (SUTURE) ×2 IMPLANT
SUT PROLENE 3 0 PS 1 (SUTURE) ×4 IMPLANT
SUT QUILL PDO 2-0 (SUTURE) ×4 IMPLANT
SYR 20CC LL (SYRINGE) ×2 IMPLANT
SYR BULB IRRIGATION 50ML (SYRINGE) ×4 IMPLANT
SYR CONTROL 10ML LL (SYRINGE) ×6 IMPLANT
TOWEL OR 17X24 6PK STRL BLUE (TOWEL DISPOSABLE) ×8 IMPLANT
TOWEL OR NON WOVEN STRL DISP B (DISPOSABLE) IMPLANT
TRAY DSU PREP LF (CUSTOM PROCEDURE TRAY) ×2 IMPLANT
TRAY FOLEY CATH SILVER 16FR (SET/KITS/TRAYS/PACK) ×2 IMPLANT
TUBE CONNECTING 20X1/4 (TUBING) ×2 IMPLANT
UNDERPAD 30X30 (UNDERPADS AND DIAPERS) ×4 IMPLANT
VAC PENCILS W/TUBING CLEAR (MISCELLANEOUS) ×2 IMPLANT
YANKAUER SUCT BULB TIP NO VENT (SUCTIONS) ×2 IMPLANT

## 2015-01-25 NOTE — Discharge Instructions (Signed)
1. No lifting greater than 5 lbs with arms for 4 weeks. 2. Empty, strip, record and reactivate JP drains 3 times a day. 3. Percocet 5/325 mg tabs 1-2 tabs po q 4-6 hours prn pain- prescription given in office. 4. Duricef 1 tab po bid- prescription given in office. 5. Sterapred dose pack as directed- prescription given in office. 6. Follow-up appointment ina few days in office.         JP Drain [employer] this sheet to all of your post-operative appointments while you have your drains.  Please measure your drains by CC's or ML's.  Make sure you drain and measure your JP Drains 2 or 3 times per day.  At the end of each day, add up totals for the left side and add up totals for the right side.    ( 9 am )     ( 3 pm )        ( 9 pm )                Date L  R  L  R  L  R  Total L/R                                                                                                                                                                                           JP Drain Totals  Bring this sheet to all of your post-operative appointments while you have your drains.  Please measure your drains by CC's or ML's.  Make sure you drain and measure your JP Drains 2 or 3 times per day.  At the end of each day, add up totals for the left side and add up totals for the right side.    ( 9 am )     ( 3 pm )        ( 9 pm )                Date L  R  L  R  L  R  Total L/R  Post Anesthesia Home Care Instructions  Activity: Get plenty of rest for the remainder of the day. A responsible adult should stay with you for 24 hours following the procedure.  For the next 24 hours, DO NOT: -Drive a car -Paediatric nurse -Drink alcoholic beverages -Take any medication unless  instructed by your physician -Make any legal decisions or sign important papers.  Meals: Start with liquid foods such as gelatin or soup. Progress to regular foods as tolerated. Avoid greasy, spicy, heavy foods. If nausea and/or vomiting occur, drink only clear liquids until the nausea and/or vomiting subsides. Call your physician if vomiting continues.  Special Instructions/Symptoms: Your throat may feel dry or sore from the anesthesia or the breathing tube placed in your throat during surgery. If this causes discomfort, gargle with warm salt water. The discomfort should disappear within 24 hours.  If you had a scopolamine patch placed behind your ear for the management of post- operative nausea and/or vomiting:  1. The medication in the patch is effective for 72 hours, after which it should be removed.  Wrap patch in a tissue and discard in the trash. Wash hands thoroughly with soap and water. 2. You may remove the patch earlier than 72 hours if you experience unpleasant side effects which may include dry mouth, dizziness or visual disturbances. 3. Avoid touching the patch. Wash your hands with soap and water after contact with the patch.    Information for Discharge Teaching: EXPAREL (bupivacaine liposome injectable suspension)   Your surgeon gave you EXPAREL(bupivacaine) in your surgical incision to help control your pain after surgery.   EXPAREL is a local anesthetic that provides pain relief by numbing the tissue around the surgical site.  EXPAREL is designed to release pain medication over time and can control pain for up to 72 hours.  Depending on how you respond to EXPAREL, you may require less pain medication during your recovery.  Possible side effects:  Temporary loss of sensation or ability to move in the area where bupivacaine was injected.  Nausea, vomiting, constipation  Rarely, numbness and tingling in your mouth or lips, lightheadedness, or anxiety may  occur.  Call your doctor right away if you think you may be experiencing any of these sensations, or if you have other questions regarding possible side effects.  Follow all other discharge instructions given to you by your surgeon or nurse. Eat a healthy diet and drink plenty of water or other fluids.  If you return to the hospital for any reason within 96 hours following the administration of EXPAREL, please inform your health care providers.

## 2015-01-25 NOTE — Brief Op Note (Signed)
01/25/2015  2:49 PM  PATIENT:  Adriana Moon  34 y.o. female  PRE-OPERATIVE DIAGNOSIS:1)  Bilateral Macromastia 2) H/O Right Breast Cancer  POST-OPERATIVE DIAGNOSIS: Bilateral Macromastia 2) H/O Right Breast Cancer  PROCEDURE:  Procedure(s): MAMMARY REDUCTION  (BREAST) (Bilateral)  SURGEON:  Surgeon(s) and Role:    * [name], MD - Primary  ANESTHESIA:   general  EBL:  Total I/O In: 2000 [I.V.:2000] Out: 1125 [Urine:875; Blood:250]  BLOOD ADMINISTERED:none  DRAINS: (63F) Jackson-Pratt drain(s) with closed bulb suction in the Bilateral breasts   LOCAL MEDICATIONS USED:  1.3% Exparel (total 266 mgs.)  SPECIMEN:  Source of Specimen:  Bilatera; Breasts  DISPOSITION OF SPECIMEN:  PATHOLOGY  COUNTS:  YES  DICTATION: 00000  PLAN OF CARE: Discharge to home after PACU  PATIENT DISPOSITION:  PACU - hemodynamically stable.   Delay start of Pharmacological VTE agent (>24hrs) due to surgical blood loss or risk of bleeding: not applicable

## 2015-01-25 NOTE — Anesthesia Postprocedure Evaluation (Signed)
  Anesthesia Post-op Note  Patient: Adriana Moon  Procedure(s) Performed: Procedure(s): MAMMARY REDUCTION  (BREAST) (Bilateral)  Patient Location: PACU  Anesthesia Type: General   Level of Consciousness: awake, alert  and oriented  Airway and Oxygen Therapy: Patient Spontanous Breathing  Post-op Pain: mild  Post-op Assessment: Post-op Vital signs reviewed  Post-op Vital Signs: Reviewed  Last Vitals:  Filed Vitals:   01/25/15 1615  BP: 122/72  Pulse: 68  Temp:   Resp: 23    Complications: No apparent anesthesia complications

## 2015-01-25 NOTE — Anesthesia Procedure Notes (Signed)
Procedure Name: Intubation Date/Time: 01/25/2015 9:48 AM Performed by: Lyndee Leo Pre-anesthesia Checklist: Patient identified, Emergency Drugs available, Suction available and Patient being monitored Patient Re-evaluated:Patient Re-evaluated prior to inductionOxygen Delivery Method: Circle System Utilized Preoxygenation: Pre-oxygenation with 100% oxygen Intubation Type: IV induction Ventilation: Mask ventilation without difficulty Laryngoscope Size: Mac and 3 Grade View: Grade II Tube type: Oral Tube size: 7.0 mm Number of attempts: 1 Airway Equipment and Method: Stylet and Oral airway Placement Confirmation: ETT inserted through vocal cords under direct vision,  positive ETCO2 and breath sounds checked- equal and bilateral Secured at: 22 cm Tube secured with: Tape Dental Injury: Teeth and Oropharynx as per pre-operative assessment

## 2015-01-25 NOTE — H&P (Signed)
  H&P faxed to surgical center.  -History and Physical Reviewed  -Patient has been re-examined  -No change in the plan of care  Adriana Moon    

## 2015-01-25 NOTE — Transfer of Care (Signed)
Immediate Anesthesia Transfer of Care Note  Patient: Adriana Moon  Procedure(s) Performed: Procedure(s): MAMMARY REDUCTION  (BREAST) (Bilateral)  Patient Location: PACU  Anesthesia Type:General  Level of Consciousness: awake, sedated and patient cooperative  Airway & Oxygen Therapy: Patient Spontanous Breathing and Patient connected to face mask oxygen  Post-op Assessment: Report given to RN and Post -op Vital signs reviewed and stable  Post vital signs: Reviewed and stable  Last Vitals:  Filed Vitals:   01/25/15 0805  BP: 114/62  Pulse: 67  Temp: 36.6 C  Resp: 20    Complications: No apparent anesthesia complications

## 2015-01-25 NOTE — Anesthesia Preprocedure Evaluation (Signed)
Anesthesia Evaluation  Patient identified by MRN, date of birth, ID band Patient awake    Reviewed: Allergy & Precautions, H&P , NPO status , Patient's Chart, lab work & pertinent test results  History of Anesthesia Complications Negative for: history of anesthetic complications  Airway Mallampati: II  TM Distance: >3 FB Neck ROM: full    Dental no notable dental hx.    Pulmonary neg pulmonary ROS,    Pulmonary exam normal breath sounds clear to auscultation       Cardiovascular negative cardio ROS Normal cardiovascular exam Rhythm:regular Rate:Normal     Neuro/Psych negative neurological ROS     GI/Hepatic negative GI ROS, Neg liver ROS,   Endo/Other  Morbid obesity  Renal/GU negative Renal ROS     Musculoskeletal   Abdominal   Peds  Hematology negative hematology ROS (+)   Anesthesia Other Findings   Reproductive/Obstetrics negative OB ROS                             Anesthesia Physical Anesthesia Plan  ASA: III  Anesthesia Plan: General   Post-op Pain Management:    Induction: Intravenous  Airway Management Planned: Oral ETT  Additional Equipment:   Intra-op Plan:   Post-operative Plan: Extubation in OR  Informed Consent: I have reviewed the patients History and Physical, chart, labs and discussed the procedure including the risks, benefits and alternatives for the proposed anesthesia with the patient or authorized representative who has indicated his/her understanding and acceptance.   Dental Advisory Given  Plan Discussed with: Anesthesiologist, CRNA and Surgeon  Anesthesia Plan Comments:         Anesthesia Quick Evaluation

## 2015-01-26 ENCOUNTER — Encounter (HOSPITAL_BASED_OUTPATIENT_CLINIC_OR_DEPARTMENT_OTHER): Payer: Self-pay | Admitting: Plastic Surgery

## 2015-01-27 NOTE — Op Note (Signed)
NAMEMARLEAH, BEEVER NO.:  [id number]  MEDICAL RECORD NO.:  [id number]  LOCATION:                               FACILITY:  [facility]  PHYSICIAN:  Hetty Blend, M.D.DATE OF BIRTH:  1981-01-22  DATE OF PROCEDURE:  01/25/2015 DATE OF DISCHARGE:  01/25/2015                              OPERATIVE REPORT   PREOPERATIVE DIAGNOSES: 1. Bilateral macromastia. 2. History of right breast cancer.  POSTOPERATIVE DIAGNOSIS: 1. Bilateral macromastia. 2. History of right breast cancer.  PROCEDURE:  Bilateral reduction mammoplasties.  SURGEON:  Hetty Blend, M.D.  ANESTHESIA:  General.  COMPLICATIONS:  None.  INDICATIONS FOR THE PROCEDURE:  The patient is a 34 year old African American female, who has bilateral macromastia that is clinically symptomatic.  Additionally, she also has been diagnosed with right breast cancer and in the past has undergone a lumpectomy followed by radiation therapy.  There are some changes to that breast due to the radiation therapy and it is smaller compared to the left.  I reviewed very carefully with the patient that postoperatively, the right breast may continue to develop more of a drawn or contracted appearance due to the history of radiation therapy.  All of the risks and benefits in general with the reduction were discussed as well in detail.  She has decided that she would like to proceed with the bilateral reduction mammoplasties.  She understands that her results over time may change in the right breast due to the history of radiation therapy.  She accepts these risks.  Due to the history of breast cancer in the right breast as well and the resulting size discrepancy in her breast, I will not need to takeoff as much tissue in the right breast, compared to the left.  We therefore proceeded with the bilateral reduction mammoplasties.  DESCRIPTION OF PROCEDURE:  The patient was marked in the preop holding area in the pattern  of Wise for the future bilateral reduction mammoplasties as well.  She was then taken back to the OR and laid on the table in supine position.  After adequate general anesthesia was obtained, the patient's chest was prepped with Techni-Care and draped in sterile fashion.  The bases of the breasts had been infiltrated with 1% lidocaine with epinephrine.  After adequate hemostasis and anesthesia taken effect, the procedure was begun.  Both of the breast reductions were performed in the following similar manner.  The nipple-areolar complex was marked with a 45 mm nipple marker.  The skin was then de-epithelialized around the nipple-areolar complex down to the inframammary crease in the inferior pedicle pattern. It should be noted that on the right breast, however, there were some significant radiation changes in the skin between the nipple-areolar complex and the inframammary crease, so this was very carefully done and as much of this change was excised as possible.  Next the medial, superior, and lateral skin flaps were elevated down to the chest wall. Excess fat and glandular tissue removed from the inferior pedicle.  The nipple-areolar complex was examined and found to be pink and viable. The wound was then irrigated with saline irrigation.  Meticulous hemostasis was obtained with the Bovie electrocautery.  The inferior pedicle was centralized using  3-0 Prolene suture.  A #10 JP flat fully fluted drain was placed into the wound.  The skin flaps brought together at the inverted T junction with a 2-0 Prolene suture.  The incisions were stapled for temporary closure.  The breasts were then compared and found to have good shape and symmetry.  The incisions were then closed from the medial aspect of JP drain to the medial aspect of the [facility] incision by first placing a few 3-0 Monocryl sutures in the dermal layer to tack it together and then both the dermal and cuticular layer were closed in a  single layer using a 2-0 Quill PDO Barbed suture.  Lateral to the JP drain, the incision was closed using 3-0 Monocryl in the dermal layer, followed by 3-0 Monocryl running intracuticular stitch on the skin.  The patient was then placed in the upright position.  The future location of the nipple-areolar complexes was marked on both breast mounds using the 45 mm nipple marker.  She was then placed back in the recumbent position.  Both of the nipple-areolar complexes were brought out onto the breast mounds in the following similar manner.  The skin was excised as marked and removed full thickness into the subcutaneous tissues.  The nipple- areolar complex was examined, found to be pink and viable, and then brought out through this aperture, and sewn in place.  Using 4-0 Monocryl in the dermal layer, followed by a 5-0 Monocryl running intracuticular stitch on the skin.  The vertical limb of the Wise pattern had been closed in the dermal layer using 3-0 Monocryl suture. The 5-0 Monocryl sutures were used in continuity with closure of the nipple-areolar complex to close the cuticular layer up the vertical limb as well.  The JP drains were sewn in place using 3-0 nylon suture. Prior to closure, the breasts wound, the pectoralis major muscle and chest wall musculature along with the breast soft tissue had been infiltrated to 1.3% Exparel and now the inframammary crease incision area of the soft tissues were also injected for a total of 266 mg. of Exparel to provide postoperative pain control for the patient.  The incisions were dressed with benzoin and Steri-Strips, and the nipples additionally with bacitracin ointment and Adaptic.  4x4s were placed over the incisions and ABD pads in the axillary areas.  There are no complications.  The patient tolerated the procedure well.  The final needle and sponge counts were reported to be correct at the end of the case.  The patient was then  extubated and taken to recovery room in and stable condition.  She was also recovered without complications.  Both the patient and her mother were given proper postoperative wound care instructions including care of the JP drains.  She was then discharged home in the care of her mother stable condition.  Follow up will be with a few days in the office.          ______________________________ Hetty Blend, M.D.     MC/MEDQ  D:  01/26/2015  T:  01/26/2015  Job:  [id number]

## 2015-02-14 ENCOUNTER — Telehealth: Payer: Self-pay | Admitting: *Deleted

## 2015-02-14 NOTE — Telephone Encounter (Signed)
Pt returned my call and I confirmed 02/25/15 appt w/ her.  Placed a note for an intake form to be given to the pt at time of check in.

## 2015-02-14 NOTE — Telephone Encounter (Signed)
Pt left a message requesting an appt w/ Dr. Lindi Adie.  Called and left a message for her to return my call so I can schedule her.

## 2015-02-22 ENCOUNTER — Telehealth: Payer: Self-pay | Admitting: Obstetrics & Gynecology

## 2015-02-23 ENCOUNTER — Telehealth: Payer: Self-pay | Admitting: Obstetrics & Gynecology

## 2015-02-23 MED ORDER — PANTOPRAZOLE SODIUM 20 MG PO TBEC: 20.0000 mg | DELAYED_RELEASE_TABLET | Freq: Every day | ORAL | Status: DC

## 2015-02-23 NOTE — Telephone Encounter (Signed)
Pt states does not have a PCP right now was requesting for Dr.Eure to give Rx for Protonix.

## 2015-02-25 ENCOUNTER — Telehealth: Payer: Self-pay | Admitting: Hematology and Oncology

## 2015-02-25 ENCOUNTER — Encounter: Payer: Self-pay | Admitting: Hematology and Oncology

## 2015-02-25 ENCOUNTER — Ambulatory Visit (HOSPITAL_BASED_OUTPATIENT_CLINIC_OR_DEPARTMENT_OTHER): Payer: BLUE CROSS/BLUE SHIELD | Admitting: Hematology and Oncology

## 2015-02-25 VITALS — BP 114/76 | HR 70 | Temp 98.7°F | Resp 18 | Ht 60.0 in | Wt 270.7 lb

## 2015-02-25 DIAGNOSIS — Z79810 Long term (current) use of selective estrogen receptor modulators (SERMs): Secondary | ICD-10-CM | POA: Diagnosis not present

## 2015-02-25 DIAGNOSIS — Z170 Estrogen receptor positive status [ER+]: Secondary | ICD-10-CM

## 2015-02-25 DIAGNOSIS — C50511 Malignant neoplasm of lower-outer quadrant of right female breast: Secondary | ICD-10-CM | POA: Diagnosis not present

## 2015-02-25 DIAGNOSIS — C50512 Malignant neoplasm of lower-outer quadrant of left female breast: Secondary | ICD-10-CM

## 2015-02-25 MED ORDER — TAMOXIFEN CITRATE 20 MG PO TABS: 20.0000 mg | ORAL_TABLET | Freq: Every day | ORAL | Status: DC

## 2015-02-25 NOTE — Progress Notes (Signed)
[facility] CONSULT NOTE  Patient Care Team: No Pcp Per Patient as PCP - General (General Practice)  CHIEF COMPLAINTS/PURPOSE OF CONSULTATION:  Newly diagnosed right breast atypical ductal hyperplasia  HISTORY OF PRESENTING ILLNESS:  Adriana Moon 34 y.o. female is here because of recent diagnosis of right breast atypical ductal hyperplasia. 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 This was in 2009. She underwent lumpectomy followed by radiation followed by tamoxifen and Zoladex for 2 years. She felt very foggy in the head and discontinued the treatment. She has not been followed by oncology since then. Recently she went to plastic surgery and underwent bilateral breast reduction procedure. The pathology on the right breast showed atypical ductal hyperplasia. She was sent to [name] for discussion regarding risks reduction treatments. She has recovered very well from surgery and appears to be doing very well.  I reviewed her records extensively and collaborated the history with the patient.  SUMMARY OF ONCOLOGIC HISTORY:   Breast cancer of lower-outer quadrant of right female breast ([place])   06/17/2007 Surgery right lumpectomy: Invasive ductal carcinoma 1.8 cm with abundant extracellular mucin 1.8 cm, grade 2, lymphovascular invasion present, margins negative, 7 lymph nodes negative, T1 cN0 stage IA   07/15/2007 - 08/27/2007 Radiation Therapy adjuvant radiation therapy   09/25/2007 - 09/24/2009 Anti-estrogen oral therapy Tamoxifen with Zoladex for 2 years patient stopped it because she felt foggy in the head   01/25/2015 Surgery breast reduction surgery: Right breast atypical ductal hyperplasia microscopic focus left breast benign   02/25/2015 -  Anti-estrogen oral therapy tamoxifen 20 mg daily restarted    MEDICAL HISTORY:  Past Medical History  Diagnosis Date  . Arthritis of both knees   .  Migraines   . Breast cancer [facility])     SURGICAL HISTORY: Past Surgical History  Procedure Laterality Date  . Cesarean section    . Rt lumpectomy    . Breast reduction surgery Bilateral 01/25/2015    Procedure: MAMMARY REDUCTION  (BREAST);  Surgeon: [name], MD;  Location: [facility];  Service: Plastics;  Laterality: Bilateral;    SOCIAL HISTORY: Social History   Social History  . Marital Status: Legally Separated    Spouse Name: N/A  . Number of Children: N/A  . Years of Education: N/A   Occupational History  . Not on file.   Social History Main Topics  . Smoking status: Never Smoker   . Smokeless tobacco: Never Used  . Alcohol Use: 0.0 oz/week    0 Standard drinks or equivalent per week     Comment: occ.   . Drug Use: No  . Sexual Activity: Yes    Birth Control/ Protection: None   Other Topics Concern  . Not on file   Social History Narrative    FAMILY HISTORY: Family History  Problem Relation Age of Onset  . Diabetes Father   . Hypertension Father   . Gout Father     ALLERGIES:  is allergic to nsaids and shellfish allergy.  MEDICATIONS:  Current Outpatient Prescriptions  Medication Sig Dispense Refill  . cetirizine (ZYRTEC) 10 MG tablet Take 10 mg by mouth daily.    . pantoprazole (PROTONIX) 20 MG tablet Take 1 tablet (20 mg total) by mouth daily. 30 tablet 11  . tamoxifen (NOLVADEX) 20 MG tablet Take 1 tablet (20 mg total) by mouth daily. 90 tablet 3   No  current facility-administered medications for this visit.    REVIEW OF SYSTEMS:   Constitutional: Denies fevers, chills or abnormal night sweats Eyes: Denies blurriness of vision, double vision or watery eyes Ears, nose, mouth, throat, and face: Denies mucositis or sore throat Respiratory: Denies cough, dyspnea or wheezes Cardiovascular: Denies palpitation, chest discomfort or lower extremity swelling Gastrointestinal:  Denies nausea, heartburn or change in bowel  habits Skin: Denies abnormal skin rashes Lymphatics: Denies new lymphadenopathy or easy bruising Neurological:Denies numbness, tingling or new weaknesses Behavioral/Psych: Mood is stable, no new changes  Breast: recovering well from recent surgery. All other systems were reviewed with the patient and are negative.  PHYSICAL EXAMINATION: ECOG PERFORMANCE STATUS: 1 - Symptomatic but completely ambulatory  Filed Vitals:   02/25/15 1119  BP: 114/76  Pulse: 70  Temp: 98.7 F (37.1 C)  Resp: 18   Filed Weights   02/25/15 1119  Weight: 270 lb 11.2 oz (122.789 kg)    GENERAL:alert, no distress and comfortable SKIN: skin color, texture, turgor are normal, no rashes or significant lesions EYES: normal, conjunctiva are pink and non-injected, sclera clear OROPHARYNX:no exudate, no erythema and lips, buccal mucosa, and tongue normal  NECK: supple, thyroid normal size, non-tender, without nodularity LYMPH:  no palpable lymphadenopathy in the cervical, axillary or inguinal LUNGS: clear to auscultation and percussion with normal breathing effort HEART: regular rate & rhythm and no murmurs and no lower extremity edema ABDOMEN:abdomen soft, non-tender and normal bowel sounds Musculoskeletal:no cyanosis of digits and no clubbing  PSYCH: alert & oriented x 3 with fluent speech NEURO: no focal motor/sensory deficits  LABORATORY DATA:  I have reviewed the data as listed Lab Results  Component Value Date   WBC 3.4* 03/10/2010   HGB 15.2* 01/25/2015   HCT 36.4 03/10/2010   MCV 82.5 03/10/2010   PLT 351 03/10/2010   Lab Results  Component Value Date   NA 141 03/10/2010   K 4.2 03/10/2010   CL 106 03/10/2010   CO2 28 03/10/2010   ASSESSMENT AND PLAN:  Breast cancer of lower-outer quadrant of right female breast ([place]) History of left breast cancer diagnosed in 2009 treated with lumpectomy followed by radiation in 2 years of antiestrogen therapy with tamoxifen plus Zoladex, 1.8 cm  invasive ductal carcinoma, 0/8 lymph nodes negative, margins negative, ER/PR positive HER-2 negative, T1 cN0 stage IA  Reduction mammoplasty 01/25/2015 right breast atypical ductal hyperplasia Pathology review: I discussed the pathology report in great detail with her describing that atypical ductal hyperplasia is a proliferation of the lining of the duct and it signifies a risk factor for breast cancer. She understands that it is not a precancerous lesion.  Recommendation: I discussed the pros and cons of this reduction therapy with tamoxifen. Because of her prior history of breast cancer, she elected to restart tamoxifen today. Our plan treatment duration is 5 years.  Tamoxifen counseling:We discussed the risks and benefits of tamoxifen. These include but not limited to insomnia, hot flashes, mood changes, vaginal dryness, and weight gain. Although rare, serious side effects including endometrial cancer, risk of blood clots were also discussed. We strongly believe that the benefits far outweigh the risks. Patient understands these risks and consented to starting treatment. Planned treatment duration is 5 years.  Return to clinic in 3 months for follow-up and toxicity check     All questions were answered. The patient knows to call the clinic with any problems, questions or concerns.    Rulon Eisenmenger, MD 1:05 PM

## 2015-02-25 NOTE — Addendum Note (Signed)
Addended by: Prentiss Bells on: 02/25/2015 06:11 PM   Modules accepted: Medications

## 2015-02-25 NOTE — Assessment & Plan Note (Signed)
History of left breast cancer diagnosed in 2009 treated with lumpectomy followed by radiation in 2 years of antiestrogen therapy with tamoxifen plus Zoladex, 1.8 cm invasive ductal carcinoma, 0/8 lymph nodes negative, margins negative, ER/PR positive HER-2 negative, T1 cN0 stage IA  Reduction mammoplasty 01/25/2015 right breast atypical ductal hyperplasia Pathology review: I discussed the pathology report in great detail with her describing that atypical ductal hyperplasia is a proliferation of the lining of the duct and it signifies a risk factor for breast cancer. She understands that it is not a precancerous lesion.  Recommendation: I discussed the pros and cons of this reduction therapy with tamoxifen. Because of her prior history of breast cancer, she elected to restart tamoxifen today. Our plan treatment duration is 5 years.  Tamoxifen counseling:We discussed the risks and benefits of tamoxifen. These include but not limited to insomnia, hot flashes, mood changes, vaginal dryness, and weight gain. Although rare, serious side effects including endometrial cancer, risk of blood clots were also discussed. We strongly believe that the benefits far outweigh the risks. Patient understands these risks and consented to starting treatment. Planned treatment duration is 5 years.  Return to clinic in 3 months for follow-up and toxicity check

## 2015-02-25 NOTE — Telephone Encounter (Signed)
lvm for pt regarding to  Feb 2017 appt.... °

## 2015-03-03 ENCOUNTER — Other Ambulatory Visit: Payer: Self-pay

## 2015-05-06 ENCOUNTER — Other Ambulatory Visit: Payer: Self-pay | Admitting: Plastic Surgery

## 2015-05-06 DIAGNOSIS — N6310 Unspecified lump in the right breast, unspecified quadrant: Secondary | ICD-10-CM

## 2015-05-06 DIAGNOSIS — Z9889 Other specified postprocedural states: Secondary | ICD-10-CM

## 2015-05-06 DIAGNOSIS — Z853 Personal history of malignant neoplasm of breast: Secondary | ICD-10-CM

## 2015-05-19 ENCOUNTER — Ambulatory Visit (INDEPENDENT_AMBULATORY_CARE_PROVIDER_SITE_OTHER): Payer: BLUE CROSS/BLUE SHIELD | Admitting: Pediatrics

## 2015-05-19 ENCOUNTER — Encounter: Payer: Self-pay | Admitting: Pediatrics

## 2015-05-19 VITALS — BP 102/70 | HR 62 | Temp 97.7°F | Ht 60.0 in | Wt 260.8 lb

## 2015-05-19 DIAGNOSIS — Z6841 Body Mass Index (BMI) 40.0 and over, adult: Secondary | ICD-10-CM

## 2015-05-19 DIAGNOSIS — Z23 Encounter for immunization: Secondary | ICD-10-CM

## 2015-05-19 DIAGNOSIS — N189 Chronic kidney disease, unspecified: Secondary | ICD-10-CM | POA: Diagnosis not present

## 2015-05-19 LAB — POCT URINALYSIS DIPSTICK
Bilirubin, UA: NEGATIVE
Glucose, UA: NEGATIVE
Leukocytes, UA: NEGATIVE
NITRITE UA: NEGATIVE
PH UA: 5
Protein, UA: NEGATIVE
Spec Grav, UA: 1.02
UROBILINOGEN UA: NEGATIVE

## 2015-05-19 NOTE — Progress Notes (Signed)
Subjective:    Patient ID: Adriana Moon, female    DOB: 1981-04-14, 35 y.o.   MRN: [id number]  CC: New Patient (Initial Visit)   HPI: Adriana Moon is a 35 y.o. female presenting for New Patient (Initial Visit)  Says Cr was up to 1.8 recently Checked for work Rest of labs were normal Had work physical doen, will bring in labs for review, doesn't remember any other abnormal labs No h/o kidney problems in pt or family Is interested in losing weight Was seen recently by urology scheduled by pt for concern for rising Cr. Pt says her renal u/s was normal Pt with h/o breast cancer  Depression screen [facility] 2/9 05/19/2015  Decreased Interest 0  Down, Depressed, Hopeless 0  PHQ - 2 Score 0     ROS: All systems negative other than what is in HPI   Past Medical History  Diagnosis Date  . Arthritis of both knees   . Migraines   . Breast cancer [facility])    Social History   Social History  . Marital Status: Legally Separated    Spouse Name: N/A  . Number of Children: N/A  . Years of Education: N/A   Occupational History  . Not on file.   Social History Main Topics  . Smoking status: Never Smoker   . Smokeless tobacco: Never Used  . Alcohol Use: 0.0 oz/week    0 Standard drinks or equivalent per week     Comment: occ.   . Drug Use: No  . Sexual Activity: Yes    Birth Control/ Protection: None   Other Topics Concern  . Not on file   Social History Narrative   Family History  Problem Relation Age of Onset  . Diabetes Father   . Hypertension Father   . Gout Father   . Cancer Paternal Grandmother      Current Outpatient Prescriptions  Medication Sig Dispense Refill  . cetirizine (ZYRTEC) 10 MG tablet Take 10 mg by mouth daily.    . pantoprazole (PROTONIX) 20 MG tablet Take 1 tablet (20 mg total) by mouth daily. 30 tablet 11  . tamoxifen (NOLVADEX) 20 MG tablet Take 1 tablet (20 mg total) by mouth daily. 90 tablet 3   No current  facility-administered medications for this visit.       Objective:    BP 102/70 mmHg  Pulse 62  Temp(Src) 97.7 F (36.5 C) (Oral)  Ht 5' (1.524 m)  Wt 260 lb 12.8 oz (118.298 kg)  BMI 50.93 kg/m2  Wt Readings from Last 3 Encounters:  05/19/15 260 lb 12.8 oz (118.298 kg)  02/25/15 270 lb 11.2 oz (122.789 kg)  01/25/15 278 lb (126.1 kg)     Gen: NAD, alert, cooperative with exam, NCAT EYES: EOMI, no scleral injection or icterus ENT:  TMs pearly gray b/l, OP without erythema LYMPH: no cervical LAD CV: NRRR, normal S1/S2, no murmur, distal pulses 2+ b/l Resp: CTABL, no wheezes, normal WOB Abd: +BS, soft, NTND. no guarding or organomegaly Ext: No edema, warm Neuro: Alert and oriented, strength equal b/l UE and LE, coordination grossly normal MSK: normal muscle bulk     Assessment & Plan:    Treana was seen today for multiple problem f/u.  Diagnoses and all orders for this visit:  Chronic kidney disease, unspecified stage Will review labs when pt brings them back in. Will repeat in 4 weeks. UA today to look for protein. -     POCT  urinalysis dipstick  Encounter for immunization -     Flu Vaccine QUAD 36+ mos IM  BMI 50.0-59.9, adult (HCC) Discussed lifestyle changes, diet changes, increased physical acitvities.  H/o breast cancer Gets mammograms regularly, on tamoxifen   Follow up plan: 4 weeks  [name], MD [facility] 05/19/2015, 4:39 PM

## 2015-05-22 DIAGNOSIS — N189 Chronic kidney disease, unspecified: Secondary | ICD-10-CM | POA: Insufficient documentation

## 2015-05-22 DIAGNOSIS — Z6841 Body Mass Index (BMI) 40.0 and over, adult: Secondary | ICD-10-CM | POA: Insufficient documentation

## 2015-05-26 NOTE — Assessment & Plan Note (Deleted)
History of left breast cancer diagnosed in 2009 treated with lumpectomy followed by radiation in 2 years of antiestrogen therapy with tamoxifen plus Zoladex, 1.8 cm invasive ductal carcinoma, 0/8 lymph nodes negative, margins negative, ER/PR positive HER-2 negative, T1 cN0 stage IA  Reduction mammoplasty 01/25/2015 right breast atypical ductal hyperplasia Current treatment: tamoxifen 20 mg daily 02/25/15  Tamoxifen Toxicities:   RTC in 6 months

## 2015-05-27 ENCOUNTER — Other Ambulatory Visit: Payer: Self-pay

## 2015-05-27 ENCOUNTER — Ambulatory Visit: Payer: BLUE CROSS/BLUE SHIELD | Admitting: Hematology and Oncology

## 2015-05-30 ENCOUNTER — Ambulatory Visit
Admission: RE | Admit: 2015-05-30 | Discharge: 2015-05-30 | Disposition: A | Discharge status: Home / Self Care | Payer: BLUE CROSS/BLUE SHIELD | Source: Ambulatory Visit | Attending: Plastic Surgery | Admitting: Plastic Surgery

## 2015-05-30 ENCOUNTER — Ambulatory Visit: Payer: Self-pay | Admitting: Pharmacist

## 2015-05-30 DIAGNOSIS — N6310 Unspecified lump in the right breast, unspecified quadrant: Secondary | ICD-10-CM

## 2015-05-30 DIAGNOSIS — Z853 Personal history of malignant neoplasm of breast: Secondary | ICD-10-CM

## 2015-05-30 DIAGNOSIS — Z9889 Other specified postprocedural states: Secondary | ICD-10-CM

## 2015-05-31 ENCOUNTER — Telehealth: Payer: Self-pay | Admitting: Hematology and Oncology

## 2015-05-31 NOTE — Telephone Encounter (Signed)
Left message for patient and mailed schedule.

## 2015-06-08 NOTE — Assessment & Plan Note (Deleted)
History of left breast cancer diagnosed in 2009 treated with lumpectomy followed by radiation in 2 years of antiestrogen therapy with tamoxifen plus Zoladex, 1.8 cm invasive ductal carcinoma, 0/8 lymph nodes negative, margins negative, ER/PR positive HER-2 negative, T1 cN0 stage IA  Reduction mammoplasty 01/25/2015 right breast atypical ductal hyperplasia Current treatment: tamoxifen 20 mg daily 02/25/15  Tamoxifen Toxicities:   RTC in 6 months

## 2015-06-09 ENCOUNTER — Ambulatory Visit: Payer: BLUE CROSS/BLUE SHIELD | Admitting: Hematology and Oncology

## 2015-08-10 ENCOUNTER — Ambulatory Visit: Payer: BLUE CROSS/BLUE SHIELD | Admitting: Obstetrics and Gynecology

## 2015-08-11 ENCOUNTER — Encounter: Payer: Self-pay | Admitting: Obstetrics and Gynecology

## 2015-08-11 ENCOUNTER — Ambulatory Visit (INDEPENDENT_AMBULATORY_CARE_PROVIDER_SITE_OTHER): Payer: BLUE CROSS/BLUE SHIELD | Admitting: Obstetrics and Gynecology

## 2015-08-11 VITALS — BP 124/70 | Ht 60.0 in | Wt 242.0 lb

## 2015-08-11 DIAGNOSIS — N898 Other specified noninflammatory disorders of vagina: Secondary | ICD-10-CM | POA: Diagnosis not present

## 2015-08-11 NOTE — Progress Notes (Signed)
[facility] Visit  _0 @            Patient name: Adriana Moon MRN [id number]  Date of birth: 1980/12/13  CC & HPI:  Adriana Moon is a 35 y.o. female presenting today for vaginal discharge. She states she has not had sexual activity since November 2016, so she doubts any STD's. She reports she has been losing weight by undergoing a ketogenic diet several months ago, causing her to lose 30 lbs.  Patient reports she is taking tamoxifen for breast cancer. She was tested for BRCA-1 but was negative. She notes a history of PCOS but does not complain of any associated symptoms at this time.   ROS:  Review of Systems  Genitourinary:       Positive for vaginal discharge.   All other systems reviewed and are negative.  Pertinent History Reviewed:   Reviewed: Significant for Cesarean section, endometrial ablation, breast cancer Medical         Past Medical History  Diagnosis Date  . Arthritis of both knees   . Migraines   . Breast cancer [facility])                               Surgical Hx:    Past Surgical History  Procedure Laterality Date  . Cesarean section    . Rt lumpectomy    . Breast reduction surgery Bilateral 01/25/2015    Procedure: MAMMARY REDUCTION  (BREAST);  Surgeon: [name], MD;  Location: [facility];  Service: Plastics;  Laterality: Bilateral;  . Endometrial ablation     Medications: Reviewed & Updated - see associated section                       Current outpatient prescriptions:  .  cetirizine (ZYRTEC) 10 MG tablet, Take 10 mg by mouth daily., Disp: , Rfl:  .  pantoprazole (PROTONIX) 20 MG tablet, Take 1 tablet (20 mg total) by mouth daily., Disp: 30 tablet, Rfl: 11 .  tamoxifen (NOLVADEX) 20 MG tablet, Take 1 tablet (20 mg total) by mouth daily., Disp: 90 tablet, Rfl: 3   Social History: Reviewed -  reports that she has never smoked. She has never used smokeless tobacco.  Objective Findings:  Vitals: Blood  pressure 124/70, height 5' (1.524 m), weight 242 lb (109.77 kg).  Physical Examination: General appearance - alert, well appearing, and in no distress, oriented to person, place, and time and overweight Mental status - alert, oriented to person, place, and time, normal mood, behavior, speech, dress, motor activity, and thought processes, affect appropriate to mood Abdomen - exam limited by body habitus Pelvic -  VULVA: normal appearing vulva with no masses, tenderness or lesions,  VAGINA: normal appearing vagina with normal color and discharge, no lesions,  CERVIX: normal appearing cervix without discharge or lesions,  UTERUS: uterus is normal size, shape, consistency and nontender,  ADNEXA: normal adnexa in size, nontender and no masses  KOH Wet Prep Normal epithelial; negative for trichomonas, yeast, clue cells, or white cells  Assessment & Plan:   A:  1. Normal vaginal secretions. Wet prep negative for yeast.  P:  1. Follow up prn. Consider using RePHresh.   By signing my name below, I, Stephania Fragmin, attest that this documentation has been prepared under the direction and in the presence of Jonnie Kind, MD. Electronically Signed: Vinnie Level  Marin Comment, ED Scribe. 08/11/2015. 4:30 PM.  I personally performed the services described in this documentation, which was SCRIBED in my presence. The recorded information has been reviewed and considered accurate. It has been edited as necessary during review. Jonnie Kind, MD

## 2015-08-11 NOTE — Addendum Note (Signed)
Addended by: Farley Ly on: 08/11/2015 04:36 PM   Modules accepted: Orders

## 2015-08-13 LAB — GC/CHLAMYDIA PROBE AMP
Chlamydia trachomatis, NAA: NEGATIVE
NEISSERIA GONORRHOEAE BY PCR: NEGATIVE

## 2015-09-14 ENCOUNTER — Ambulatory Visit: Payer: BLUE CROSS/BLUE SHIELD | Admitting: Family Medicine

## 2015-09-15 DIAGNOSIS — I83893 Varicose veins of bilateral lower extremities with other complications: Secondary | ICD-10-CM | POA: Diagnosis not present

## 2015-09-15 DIAGNOSIS — I83813 Varicose veins of bilateral lower extremities with pain: Secondary | ICD-10-CM | POA: Diagnosis not present

## 2015-09-20 DIAGNOSIS — J3501 Chronic tonsillitis: Secondary | ICD-10-CM | POA: Diagnosis not present

## 2015-09-20 DIAGNOSIS — R0982 Postnasal drip: Secondary | ICD-10-CM | POA: Diagnosis not present

## 2015-09-20 DIAGNOSIS — J342 Deviated nasal septum: Secondary | ICD-10-CM | POA: Diagnosis not present

## 2015-09-20 DIAGNOSIS — J310 Chronic rhinitis: Secondary | ICD-10-CM | POA: Diagnosis not present

## 2015-10-14 ENCOUNTER — Encounter: Payer: Self-pay | Admitting: Genetic Counselor

## 2015-12-16 ENCOUNTER — Other Ambulatory Visit: Payer: Self-pay | Admitting: Obstetrics & Gynecology

## 2015-12-16 DIAGNOSIS — Z1231 Encounter for screening mammogram for malignant neoplasm of breast: Secondary | ICD-10-CM

## 2015-12-19 ENCOUNTER — Ambulatory Visit (INDEPENDENT_AMBULATORY_CARE_PROVIDER_SITE_OTHER): Payer: BLUE CROSS/BLUE SHIELD | Admitting: Family Medicine

## 2015-12-19 VITALS — BP 117/78 | HR 81 | Temp 97.1°F | Ht 60.0 in | Wt 243.8 lb

## 2015-12-19 DIAGNOSIS — R1032 Left lower quadrant pain: Secondary | ICD-10-CM

## 2015-12-19 DIAGNOSIS — R3915 Urgency of urination: Secondary | ICD-10-CM | POA: Diagnosis not present

## 2015-12-19 LAB — URINALYSIS, COMPLETE
Bilirubin, UA: NEGATIVE
GLUCOSE, UA: NEGATIVE
Ketones, UA: NEGATIVE
LEUKOCYTES UA: NEGATIVE
Nitrite, UA: NEGATIVE
PH UA: 5.5 (ref 5.0–7.5)
PROTEIN UA: NEGATIVE
Specific Gravity, UA: 1.02 (ref 1.005–1.030)
Urobilinogen, Ur: 0.2 mg/dL (ref 0.2–1.0)

## 2015-12-19 LAB — MICROSCOPIC EXAMINATION

## 2015-12-19 MED ORDER — CIPROFLOXACIN HCL 500 MG PO TABS: 500.0000 mg | ORAL_TABLET | Freq: Two times a day (BID) | ORAL | 0 refills | Status: DC

## 2015-12-19 MED ORDER — FLUCONAZOLE 150 MG PO TABS: 150.0000 mg | ORAL_TABLET | Freq: Once | ORAL | 0 refills | Status: AC

## 2015-12-19 NOTE — Progress Notes (Addendum)
Subjective:  Patient ID: Adriana Moon, female    DOB: [birth year]  Age: 35 y.o. MRN: [id number]  CC: Urinary Urgency (urgency and pressure, some nausea) and Gastroesophageal Reflux   HPI Adriana Moon presents for suprapubic pressure just left of midline without burning with urination or frequency for one day. Worsening. Denies fever . No flank pain. No nausea, vomiting. Has hx of PCOS    History Adriana Moon has a past medical history of Arthritis of both knees; Breast cancer ([place]); and Migraines.   She has a past surgical history that includes Cesarean section; RT lumpectomy; Breast reduction surgery (Bilateral, 01/25/2015); and Endometrial ablation.   Her family history includes Cancer in her paternal grandmother; Diabetes in her father; Gout in her father; Hypertension in her father.She reports that she has never smoked. She has never used smokeless tobacco. She reports that she drinks alcohol. She reports that she does not use drugs.    ROS Review of Systems  Constitutional: Negative for chills, diaphoresis and fever.  HENT: Negative for congestion.   Eyes: Negative for visual disturbance.  Respiratory: Negative for cough and shortness of breath.   Cardiovascular: Negative for chest pain and palpitations.  Gastrointestinal: Negative for constipation, diarrhea and nausea.  Genitourinary: Positive for dysuria, frequency and urgency. Negative for decreased urine volume, flank pain, hematuria, menstrual problem and pelvic pain.  Musculoskeletal: Negative for arthralgias and joint swelling.  Skin: Negative for rash.  Neurological: Negative for dizziness and numbness.    Objective:  BP 117/78 (BP Location: Left Arm, Patient Position: Sitting, Cuff Size: Normal)   Pulse 81   Temp 97.1 F (36.2 C) (Oral)   Ht 5' (1.524 m)   Wt 243 lb 12.8 oz (110.6 kg)   SpO2 99%   BMI 47.61 kg/m   BP Readings from Last 3 Encounters:  12/19/15 117/78  08/11/15 124/70  05/19/15  102/70    Wt Readings from Last 3 Encounters:  12/19/15 243 lb 12.8 oz (110.6 kg)  08/11/15 242 lb (109.8 kg)  05/19/15 260 lb 12.8 oz (118.3 kg)     Physical Exam  Constitutional: She is oriented to person, place, and time. She appears well-developed and well-nourished.  HENT:  Head: Normocephalic and atraumatic.  Cardiovascular: Normal rate and regular rhythm.   No murmur heard. Pulmonary/Chest: Effort normal and breath sounds normal.  Abdominal: Soft. Bowel sounds are normal. She exhibits no mass. There is no tenderness. There is no rebound and no guarding.  Musculoskeletal: She exhibits no tenderness.  Neurological: She is alert and oriented to person, place, and time.  Skin: Skin is warm and dry.  Psychiatric: She has a normal mood and affect. Her behavior is normal.     Lab Results  Component Value Date   WBC 3.4 (L) 03/10/2010   HGB 15.2 (H) 01/25/2015   HCT 36.4 03/10/2010   PLT 351 03/10/2010   GLUCOSE 61 (L) 03/10/2010   ALT 16 03/10/2010   AST 19 03/10/2010   NA 141 03/10/2010   K 4.2 03/10/2010   CL 106 03/10/2010   CREATININE 1.06 03/10/2010   BUN 11 03/10/2010   CO2 28 03/10/2010       Assessment & Plan:   Zeriah was seen today for urinary urgency and gastroesophageal reflux.  Diagnoses and all orders for this visit:  Urinary urgency -     Urinalysis, Complete  Suprapubic pain, acute, left -     US Pelvis Complete; Future  Other orders -  ciprofloxacin (CIPRO) 500 MG tablet; Take 1 tablet (500 mg total) by mouth 2 (two) times daily. -     fluconazole (DIFLUCAN) 150 MG tablet; Take 1 tablet (150 mg total) by mouth once. At onset of symptoms. Repeat at end of treatment     I am having Ms. Palau start on ciprofloxacin and fluconazole. I am also having her maintain her cetirizine, pantoprazole, and tamoxifen.  Meds ordered this encounter  Medications  . ciprofloxacin (CIPRO) 500 MG tablet    Sig: Take 1 tablet (500 mg total) by  mouth 2 (two) times daily.    Dispense:  14 tablet    Refill:  0  . fluconazole (DIFLUCAN) 150 MG tablet    Sig: Take 1 tablet (150 mg total) by mouth once. At onset of symptoms. Repeat at end of treatment    Dispense:  2 tablet    Refill:  0     Follow-up: Return if symptoms worsen or fail to improve.  Claretta Fraise, M.D.

## 2015-12-19 NOTE — Addendum Note (Signed)
Addended by: Claretta Fraise on: 12/19/2015 04:47 PM   Modules accepted: Orders

## 2015-12-23 ENCOUNTER — Other Ambulatory Visit: Payer: Self-pay | Admitting: Family Medicine

## 2015-12-23 ENCOUNTER — Telehealth: Payer: Self-pay | Admitting: Obstetrics and Gynecology

## 2015-12-23 ENCOUNTER — Telehealth: Payer: Self-pay | Admitting: Family Medicine

## 2015-12-23 DIAGNOSIS — N6320 Unspecified lump in the left breast, unspecified quadrant: Secondary | ICD-10-CM

## 2015-12-23 DIAGNOSIS — R1030 Lower abdominal pain, unspecified: Secondary | ICD-10-CM

## 2015-12-23 MED ORDER — PANTOPRAZOLE SODIUM 40 MG PO TBEC: 40.0000 mg | DELAYED_RELEASE_TABLET | Freq: Every day | ORAL | 5 refills | Status: DC

## 2015-12-23 NOTE — Telephone Encounter (Signed)
Order placed for mm diagnostic and [name] of breast per Dr. Glo Herring. Pt informed can contact GI imaging and schedule appt.

## 2015-12-23 NOTE — Telephone Encounter (Signed)
Please advise and route to Pool B 

## 2015-12-23 NOTE — Telephone Encounter (Signed)
Patient aware.

## 2015-12-23 NOTE — Telephone Encounter (Signed)
The requested med has been sent to the pharmacy. I doubled the dose of the pantoprazole for her Please let the patient know. Thanks, WS

## 2015-12-29 ENCOUNTER — Ambulatory Visit (HOSPITAL_COMMUNITY): Admission: RE | Admit: 2015-12-29 | Payer: BLUE CROSS/BLUE SHIELD | Source: Ambulatory Visit

## 2016-01-20 ENCOUNTER — Other Ambulatory Visit: Payer: BLUE CROSS/BLUE SHIELD | Admitting: Obstetrics and Gynecology

## 2016-02-10 ENCOUNTER — Other Ambulatory Visit: Payer: BLUE CROSS/BLUE SHIELD

## 2016-02-17 ENCOUNTER — Other Ambulatory Visit: Payer: BLUE CROSS/BLUE SHIELD

## 2016-03-01 ENCOUNTER — Other Ambulatory Visit: Payer: Self-pay | Admitting: Obstetrics & Gynecology

## 2016-03-09 ENCOUNTER — Ambulatory Visit
Admission: RE | Admit: 2016-03-09 | Discharge: 2016-03-09 | Disposition: A | Discharge status: Home / Self Care | Payer: BLUE CROSS/BLUE SHIELD | Source: Ambulatory Visit | Attending: Obstetrics and Gynecology | Admitting: Obstetrics and Gynecology

## 2016-03-09 DIAGNOSIS — N6320 Unspecified lump in the left breast, unspecified quadrant: Secondary | ICD-10-CM

## 2016-04-04 ENCOUNTER — Ambulatory Visit (INDEPENDENT_AMBULATORY_CARE_PROVIDER_SITE_OTHER): Payer: BLUE CROSS/BLUE SHIELD | Admitting: Obstetrics and Gynecology

## 2016-04-04 ENCOUNTER — Encounter: Payer: Self-pay | Admitting: Obstetrics and Gynecology

## 2016-04-04 ENCOUNTER — Other Ambulatory Visit (HOSPITAL_COMMUNITY)
Admission: RE | Admit: 2016-04-04 | Discharge: 2016-04-04 | Disposition: A | Discharge status: Home / Self Care | Payer: BLUE CROSS/BLUE SHIELD | Source: Ambulatory Visit | Attending: Obstetrics and Gynecology | Admitting: Obstetrics and Gynecology

## 2016-04-04 VITALS — BP 110/80 | HR 76 | Ht 60.0 in | Wt 246.0 lb

## 2016-04-04 DIAGNOSIS — Z01419 Encounter for gynecological examination (general) (routine) without abnormal findings: Secondary | ICD-10-CM | POA: Diagnosis not present

## 2016-04-04 DIAGNOSIS — Z1151 Encounter for screening for human papillomavirus (HPV): Secondary | ICD-10-CM | POA: Insufficient documentation

## 2016-04-04 NOTE — Progress Notes (Signed)
  Assessment:  Annual Gyn Exam S/p breast CA, lumpectomy, breast reduction  S/p endometrial ablation  Plan:  1. pap smear done, next pap due in 3 years 2. return annually or prn 3    Annual mammogram due to history of breast CA s/p lumpectomy  Subjective:  Adriana Moon is a 35 y.o. female No obstetric history on file. who presents for annual exam. No LMP recorded. The patient has no complaints today.   The following portions of the patient's history were reviewed and updated as appropriate: allergies, current medications, past family history, past medical history, past social history, past surgical history and problem list. Past Medical History:  Diagnosis Date  . Arthritis of both knees   . Breast cancer ([place])   . Migraines     Past Surgical History:  Procedure Laterality Date  . BREAST REDUCTION SURGERY Bilateral 01/25/2015   Procedure: MAMMARY REDUCTION  (BREAST);  Surgeon: [name], MD;  Location: [facility];  Service: Plastics;  Laterality: Bilateral;  . CESAREAN SECTION    . ENDOMETRIAL ABLATION    . RT lumpectomy       Current Outpatient Prescriptions:  .  cetirizine (ZYRTEC) 10 MG tablet, Take 10 mg by mouth daily., Disp: , Rfl:  .  tamoxifen (NOLVADEX) 20 MG tablet, Take 1 tablet (20 mg total) by mouth daily., Disp: 90 tablet, Rfl: 3  Review of Systems Constitutional: negative Gastrointestinal: negative Genitourinary: negative   Objective:  BP 110/80   Pulse 76   Ht 5' (1.524 m)   Wt 246 lb (111.6 kg)   BMI 48.04 kg/m    BMI: Body mass index is 48.04 kg/m.  General Appearance: Alert, appropriate appearance for age. No acute distress HEENT: Grossly normal Neck / Thyroid:  Cardiovascular: RRR; normal S1, S2, no murmur Lungs: CTA bilaterally Back: No CVAT Breast Exam: s/p reduction. Left breast well healed surgical scars. Mobile tissues, size C. 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 No discernable masses except for generalized firmness from radiation changes. Gastrointestinal: Soft, non-tender, no masses or organomegaly Pelvic Exam: Vulva appears normal. Elongate vagina. Small cervix difficult to see. Bimanual exam reveals normal uterus and adnexa. Rectovaginal: not indicated Lymphatic Exam: Non-palpable nodes in neck, clavicular, axillary, or inguinal regions  Skin: no rash or abnormalities Neurologic: Normal gait and speech, no tremor  Psychiatric: Alert and oriented, appropriate affect.  Urinalysis:Not done  Mallory Shirk. MD Pgr [phone] 2:06 PM    By signing my name below, I, Sonum Patel, attest that this documentation has been prepared under the direction and in the presence of Jonnie Kind, MD. Electronically Signed: Sonum Patel, Education administrator. 04/04/16. 2:06 PM.  I personally performed the services described in this documentation, which was SCRIBED in my presence. The recorded information has been reviewed and considered accurate. It has been edited as necessary during review. Jonnie Kind, MD

## 2016-04-05 DIAGNOSIS — M7741 Metatarsalgia, right foot: Secondary | ICD-10-CM | POA: Diagnosis not present

## 2016-04-05 DIAGNOSIS — B351 Tinea unguium: Secondary | ICD-10-CM | POA: Diagnosis not present

## 2016-04-06 ENCOUNTER — Telehealth: Payer: Self-pay | Admitting: Pediatrics

## 2016-04-06 LAB — CYTOLOGY - PAP
DIAGNOSIS: NEGATIVE
HPV (WINDOPATH): NOT DETECTED

## 2016-04-06 NOTE — Telephone Encounter (Signed)
Scheduled pt for CPE and pt states she may get one at that apt

## 2016-04-20 ENCOUNTER — Encounter: Payer: Self-pay | Admitting: Pediatrics

## 2016-04-20 ENCOUNTER — Ambulatory Visit (INDEPENDENT_AMBULATORY_CARE_PROVIDER_SITE_OTHER): Payer: BLUE CROSS/BLUE SHIELD | Admitting: Pediatrics

## 2016-04-20 VITALS — BP 112/74 | HR 80 | Temp 98.2°F | Ht 60.0 in | Wt 248.6 lb

## 2016-04-20 DIAGNOSIS — K219 Gastro-esophageal reflux disease without esophagitis: Secondary | ICD-10-CM

## 2016-04-20 DIAGNOSIS — B379 Candidiasis, unspecified: Secondary | ICD-10-CM

## 2016-04-20 DIAGNOSIS — Z6841 Body Mass Index (BMI) 40.0 and over, adult: Secondary | ICD-10-CM

## 2016-04-20 DIAGNOSIS — H65111 Acute and subacute allergic otitis media (mucoid) (sanguinous) (serous), right ear: Secondary | ICD-10-CM | POA: Diagnosis not present

## 2016-04-20 DIAGNOSIS — Z5181 Encounter for therapeutic drug level monitoring: Secondary | ICD-10-CM

## 2016-04-20 DIAGNOSIS — Z23 Encounter for immunization: Secondary | ICD-10-CM

## 2016-04-20 DIAGNOSIS — R1310 Dysphagia, unspecified: Secondary | ICD-10-CM | POA: Diagnosis not present

## 2016-04-20 DIAGNOSIS — R319 Hematuria, unspecified: Secondary | ICD-10-CM | POA: Diagnosis not present

## 2016-04-20 DIAGNOSIS — Z0000 Encounter for general adult medical examination without abnormal findings: Secondary | ICD-10-CM | POA: Diagnosis not present

## 2016-04-20 LAB — URINALYSIS, COMPLETE
Bilirubin, UA: NEGATIVE
Glucose, UA: NEGATIVE
Ketones, UA: NEGATIVE
LEUKOCYTES UA: NEGATIVE
Nitrite, UA: NEGATIVE
PH UA: 6 (ref 5.0–7.5)
PROTEIN UA: NEGATIVE
SPEC GRAV UA: 1.01 (ref 1.005–1.030)
Urobilinogen, Ur: 0.2 mg/dL (ref 0.2–1.0)

## 2016-04-20 LAB — MICROSCOPIC EXAMINATION: Renal Epithel, UA: NONE SEEN /hpf

## 2016-04-20 LAB — BAYER DCA HB A1C WAIVED: HB A1C (BAYER DCA - WAIVED): 5.5 % (ref <7.0)

## 2016-04-20 LAB — PREGNANCY, URINE: Preg Test, Ur: NEGATIVE

## 2016-04-20 MED ORDER — AMOXICILLIN 500 MG PO CAPS: 500.0000 mg | ORAL_CAPSULE | Freq: Two times a day (BID) | ORAL | 0 refills | Status: DC

## 2016-04-20 MED ORDER — FLUCONAZOLE 150 MG PO TABS: 150.0000 mg | ORAL_TABLET | ORAL | 0 refills | Status: DC | PRN

## 2016-04-20 MED ORDER — PHENTERMINE HCL 37.5 MG PO TABS: 37.5000 mg | ORAL_TABLET | Freq: Every day | ORAL | 0 refills | Status: DC

## 2016-04-20 MED ORDER — FAMOTIDINE 20 MG PO TABS: 20.0000 mg | ORAL_TABLET | Freq: Two times a day (BID) | ORAL | 1 refills | Status: DC

## 2016-04-20 NOTE — Progress Notes (Signed)
Subjective:   Patient ID: Adriana Moon, female    DOB: 13-May-1980, 35 y.o.   MRN: [id number] CC: Annual Exam  HPI: [name] is a 35 y.o. female presenting for Annual Exam  Screening mammo done last month Pap smear done earlier this month, normal  Has intermittent episodes of peri-umbilical pain, will last for 2-3 days, stuck in bed Walking makes pain much worse Feels nauseous, no vomiting, loss of appetite Ongoing for past two years, has gotten more intense last times Pelvic u/s ordered with last episode, pain improved by the time [name] scheduled so she didn't go H/o PCOS, has never had regular periods  Ongoing trouble with swallowing, ongoing several months Feels like food gets caught in esophagus, doesn't get to stomach, sometimes she regurgitates it Doesn't matter if liquids alone or food Doesn't happen all the time No purposeful weight loss  Aunt with kidney problems, also has DM2  No burning with urination No change in color of urination, has darker urine in the morning, still yellow, never tea colored or pink  On phentermine for about 6 mo several years ago Not drinking any sodas now Avoiding carbohydrates Wants to restart phentermine for a few months to jump start weight loss, feels like she has plateaued Not currently sexually active  Relevant past medical, surgical, family and social history reviewed. Allergies and medications reviewed and updated. History  Smoking Status  . Never Smoker  Smokeless Tobacco  . Never Used   ROS: All systems negative other than in HPI  Objective:    BP 112/74   Pulse 80   Temp 98.2 F (36.8 C) (Oral)   Ht 5' (1.524 m)   Wt 248 lb 9.6 oz (112.8 kg)   BMI 48.55 kg/m   Wt Readings from Last 3 Encounters:  04/20/16 248 lb 9.6 oz (112.8 kg)  04/04/16 246 lb (111.6 kg)  12/19/15 243 lb 12.8 oz (110.6 kg)    Gen: NAD, alert, cooperative with exam, NCAT EYES: EOMI, no conjunctival injection, or no  icterus ENT:  R TM erythematous, L TM nl, OP without erythema LYMPH: no cervical LAD CV: NRRR, normal S1/S2, no murmur, distal pulses 2+ b/l Resp: CTABL, no wheezes, normal WOB Abd: +BS, soft, obese, NTND. no guarding or organomegaly Ext: No edema, warm Neuro: Alert and oriented, strength equal b/l UE and LE, coordination grossly normal MSK: normal muscle bulk  Assessment & Plan:  Camey was seen today for annual exam and follow up and evaluation of multiple med problems  Diagnoses and all orders for this visit:  Encounter for preventive health examination -     CMP14+EGFR -     CBC with Differential/Platelet -     Lipid panel  Acute mucoid otitis media of right ear -     amoxicillin (AMOXIL) 500 MG capsule; Take 1 capsule (500 mg total) by mouth 2 (two) times daily.  Severe obesity (BMI >= 40) (HCC) Discussed lifestyle changes Will do short trial of phentermine, start with 1 Rx fo #30 tabs, start half a tab daily, if weight plateaus can increase to full tab RTC for refills 4 weeks Decrease sugar intake Eat three meals daily -     Bayer DCA Hb A1c Waived -     phentermine (ADIPEX-P) 37.5 MG tablet; Take 1 tablet (37.5 mg total) by mouth daily before breakfast.  Dysphagia, unspecified type No weight loss Food getting stuck regularly with swallowing -     Ambulatory referral to Gastroenterology  Hematuria, unspecified type Trace on last UA, 0-2 RBCs today -     Urinalysis, Complete -     Urine culture -     Microscopic Examination  Encounter for medication monitoring negative -     Pregnancy, urine  Encounter for immunization Flu shot today  Yeast infection Gets infn with abx, starting amox for AOM -     fluconazole (DIFLUCAN) 150 MG tablet; Take 1 tablet (150 mg total) by mouth every three (3) days as needed.  Gastroesophageal reflux disease, esophagitis presence not specified Taking tums at home as neede,d not helping enough  Trial below Was on pantoprazole  and stopped it because she was worried it was affecting kidneys -     famotidine (PEPCID) 20 MG tablet; Take 1 tablet (20 mg total) by mouth 2 (two) times daily.   Follow up plan: Return in about 4 weeks (around 05/18/2016). [name], MD [place]

## 2016-04-20 NOTE — Patient Instructions (Signed)
Start half tab of phentermine daily Can increase to full tab if weight plateaus

## 2016-04-21 LAB — CMP14+EGFR
ALBUMIN: 3.9 g/dL (ref 3.5–5.5)
ALT: 19 IU/L (ref 0–32)
AST: 25 IU/L (ref 0–40)
Albumin/Globulin Ratio: 1.1 — ABNORMAL LOW (ref 1.2–2.2)
Alkaline Phosphatase: 36 IU/L — ABNORMAL LOW (ref 39–117)
BUN / CREAT RATIO: 21 (ref 9–23)
BUN: 22 mg/dL — AB (ref 6–20)
Bilirubin Total: 0.3 mg/dL (ref 0.0–1.2)
CALCIUM: 9 mg/dL (ref 8.7–10.2)
CO2: 21 mmol/L (ref 18–29)
CREATININE: 1.04 mg/dL — AB (ref 0.57–1.00)
Chloride: 102 mmol/L (ref 96–106)
GFR calc Af Amer: 81 mL/min/{1.73_m2} (ref >59)
GFR, EST NON AFRICAN AMERICAN: 70 mL/min/{1.73_m2} (ref >59)
Globulin, Total: 3.4 g/dL (ref 1.5–4.5)
Glucose: 79 mg/dL (ref 65–99)
Potassium: 4.2 mmol/L (ref 3.5–5.2)
SODIUM: 139 mmol/L (ref 134–144)
TOTAL PROTEIN: 7.3 g/dL (ref 6.0–8.5)

## 2016-04-21 LAB — URINE CULTURE

## 2016-04-21 LAB — LIPID PANEL
CHOL/HDL RATIO: 2 ratio (ref 0.0–4.4)
Cholesterol, Total: 131 mg/dL (ref 100–199)
HDL: 66 mg/dL (ref >39)
LDL CALC: 53 mg/dL (ref 0–99)
Triglycerides: 62 mg/dL (ref 0–149)
VLDL Cholesterol Cal: 12 mg/dL (ref 5–40)

## 2016-04-21 LAB — CBC WITH DIFFERENTIAL/PLATELET
BASOS: 0 %
Basophils Absolute: 0 10*3/uL (ref 0.0–0.2)
EOS (ABSOLUTE): 0.1 10*3/uL (ref 0.0–0.4)
EOS: 2 %
HEMATOCRIT: 37.8 % (ref 34.0–46.6)
HEMOGLOBIN: 12.1 g/dL (ref 11.1–15.9)
IMMATURE GRANULOCYTES: 0 %
Immature Grans (Abs): 0 10*3/uL (ref 0.0–0.1)
LYMPHS ABS: 2.8 10*3/uL (ref 0.7–3.1)
Lymphs: 41 %
MCH: 27.6 pg (ref 26.6–33.0)
MCHC: 32 g/dL (ref 31.5–35.7)
MCV: 86 fL (ref 79–97)
MONOCYTES: 8 %
MONOS ABS: 0.5 10*3/uL (ref 0.1–0.9)
Neutrophils Absolute: 3.3 10*3/uL (ref 1.4–7.0)
Neutrophils: 49 %
Platelets: 307 10*3/uL (ref 150–379)
RBC: 4.39 x10E6/uL (ref 3.77–5.28)
RDW: 13.5 % (ref 12.3–15.4)
WBC: 6.8 10*3/uL (ref 3.4–10.8)

## 2016-04-26 ENCOUNTER — Telehealth: Payer: Self-pay | Admitting: Pediatrics

## 2016-04-26 NOTE — Progress Notes (Signed)
Patient aware.

## 2016-04-30 ENCOUNTER — Encounter: Payer: Self-pay | Admitting: Internal Medicine

## 2016-05-18 ENCOUNTER — Telehealth: Payer: Self-pay | Admitting: Gastroenterology

## 2016-05-18 ENCOUNTER — Encounter: Payer: Self-pay | Admitting: Gastroenterology

## 2016-05-18 ENCOUNTER — Ambulatory Visit: Payer: BLUE CROSS/BLUE SHIELD | Admitting: Gastroenterology

## 2016-05-18 NOTE — Telephone Encounter (Signed)
PATIENT WAS A NO SHOW AND LETTER SENT  °

## 2016-05-21 ENCOUNTER — Encounter: Payer: Self-pay | Admitting: Obstetrics and Gynecology

## 2016-05-21 ENCOUNTER — Ambulatory Visit (INDEPENDENT_AMBULATORY_CARE_PROVIDER_SITE_OTHER): Payer: BLUE CROSS/BLUE SHIELD | Admitting: Obstetrics and Gynecology

## 2016-05-21 ENCOUNTER — Encounter (INDEPENDENT_AMBULATORY_CARE_PROVIDER_SITE_OTHER): Payer: Self-pay

## 2016-05-21 VITALS — BP 124/82 | HR 76 | Wt 236.6 lb

## 2016-05-21 DIAGNOSIS — B373 Candidiasis of vulva and vagina: Secondary | ICD-10-CM

## 2016-05-21 DIAGNOSIS — B3731 Acute candidiasis of vulva and vagina: Secondary | ICD-10-CM

## 2016-05-21 NOTE — Progress Notes (Signed)
   [facility] Visit  05/21/16          Patient name: Adriana Moon MRN [id number]  Date of birth: 01-May-1980  CC & HPI:  Adriana Moon is a 36 y.o. female presenting today for vulvar irritation that began 24 hours ago. She was recently taking antibiotics and has a prescription for Diflucan but wanted to be evaluated before taking that medication.   ROS:  ROS +vulvar irritation  Pertinent History Reviewed:   Reviewed: Significant for  Medical         Past Medical History:  Diagnosis Date  . Arthritis of both knees   . Breast cancer ([place])   . Migraines                               Surgical Hx:    Past Surgical History:  Procedure Laterality Date  . BREAST REDUCTION SURGERY Bilateral 01/25/2015   Procedure: MAMMARY REDUCTION  (BREAST);  Surgeon: [name], MD;  Location: [facility];  Service: Plastics;  Laterality: Bilateral;  . CESAREAN SECTION    . ENDOMETRIAL ABLATION    . RT lumpectomy     Medications: Reviewed & Updated - see associated section                       Current Outpatient Prescriptions:  .  cetirizine (ZYRTEC) 10 MG tablet, Take 10 mg by mouth daily., Disp: , Rfl:  .  phentermine (ADIPEX-P) 37.5 MG tablet, Take 1 tablet (37.5 mg total) by mouth daily before breakfast., Disp: 30 tablet, Rfl: 0 .  tamoxifen (NOLVADEX) 20 MG tablet, Take 1 tablet (20 mg total) by mouth daily., Disp: 90 tablet, Rfl: 3 .  amoxicillin (AMOXIL) 500 MG capsule, Take 1 capsule (500 mg total) by mouth 2 (two) times daily. (Patient not taking: Reported on 05/21/2016), Disp: 14 capsule, Rfl: 0 .  famotidine (PEPCID) 20 MG tablet, Take 1 tablet (20 mg total) by mouth 2 (two) times daily. (Patient not taking: Reported on 05/21/2016), Disp: 60 tablet, Rfl: 1 .  fluconazole (DIFLUCAN) 150 MG tablet, Take 1 tablet (150 mg total) by mouth every three (3) days as needed. (Patient not taking: Reported on 05/21/2016), Disp: 3 tablet, Rfl: 0   Social  History: Reviewed -  reports that she has never smoked. She has never used smokeless tobacco.  Objective Findings:  Vitals: Blood pressure 124/82, pulse 76, weight 236 lb 9.6 oz (107.3 kg), last menstrual period 04/13/2016.  Physical Examination: Pelvic -  VULVA: normal appearing vulva with no masses, tenderness or lesions,  VAGINA: normal appearing vagina with normal color, no lesions, moderate white discharge CERVIX: normal appearing cervix without discharge or lesions   KOH and wet prep negative   Assessment & Plan:   A:  1. Vulvar irriatiaton  P:  1. Take prescribed Diflucan

## 2016-05-21 NOTE — Patient Instructions (Signed)
moni Vaginal Yeast infection, Adult Vaginal yeast infection is a condition that causes soreness, swelling, and redness (inflammation) of the vagina. It also causes vaginal discharge. This is a common condition. Some women get this infection frequently. What are the causes? This condition is caused by a change in the normal balance of the yeast (candida) and bacteria that live in the vagina. This change causes an overgrowth of yeast, which causes the inflammation. What increases the risk? This condition is more likely to develop in:  Women who take antibiotic medicines.  Women who have diabetes.  Women who take birth control pills.  Women who are pregnant.  Women who douche often.  Women who have a weak defense (immune) system.  Women who have been taking steroid medicines for a long time.  Women who frequently wear tight clothing. What are the signs or symptoms? Symptoms of this condition include:  White, thick vaginal discharge.  Swelling, itching, redness, and irritation of the vagina. The lips of the vagina (vulva) may be affected as well.  Pain or a burning feeling while urinating.  Pain during sex. How is this diagnosed? This condition is diagnosed with a medical history and physical exam. This will include a pelvic exam. Your health care provider will examine a sample of your vaginal discharge under a microscope. Your health care provider may send this sample for testing to confirm the diagnosis. How is this treated? This condition is treated with medicine. Medicines may be over-the-counter or prescription. You may be told to use one or more of the following:  Medicine that is taken orally.  Medicine that is applied as a cream.  Medicine that is inserted directly into the vagina (suppository). Follow these instructions at home:  Take or apply over-the-counter and prescription medicines only as told by your health care provider.  Do not have sex until your health  care provider has approved. Tell your sex partner that you have a yeast infection. That person should go to his or her health care provider if he or she develops symptoms.  Do not wear tight clothes, such as pantyhose or tight pants.  Avoid using tampons until your health care provider approves.  Eat more yogurt. This may help to keep your yeast infection from returning.  Try taking a sitz bath to help with discomfort. This is a warm water bath that is taken while you are sitting down. The water should only come up to your hips and should cover your buttocks. Do this 3-4 times per day or as told by your health care provider.  Do not douche.  Wear breathable, cotton underwear.  If you have diabetes, keep your blood sugar levels under control. Contact a health care provider if:  You have a fever.  Your symptoms go away and then return.  Your symptoms do not get better with treatment.  Your symptoms get worse.  You have new symptoms.  You develop blisters in or around your vagina.  You have blood coming from your vagina and it is not your menstrual period.  You develop pain in your abdomen. This information is not intended to replace advice given to you by your health care provider. Make sure you discuss any questions you have with your health care provider. Document Released: 01/17/2005 Document Revised: 09/21/2015 Document Reviewed: 10/11/2014 Elsevier Interactive Patient Education  2017 [employer].

## 2016-06-14 ENCOUNTER — Encounter: Payer: Self-pay | Admitting: Pediatrics

## 2016-06-14 ENCOUNTER — Ambulatory Visit (INDEPENDENT_AMBULATORY_CARE_PROVIDER_SITE_OTHER): Payer: BLUE CROSS/BLUE SHIELD | Admitting: Pediatrics

## 2016-06-14 ENCOUNTER — Other Ambulatory Visit: Payer: Self-pay | Admitting: Hematology and Oncology

## 2016-06-14 VITALS — BP 106/69 | HR 88 | Temp 97.9°F | Ht 60.0 in | Wt 240.8 lb

## 2016-06-14 DIAGNOSIS — C50512 Malignant neoplasm of lower-outer quadrant of left female breast: Secondary | ICD-10-CM

## 2016-06-14 DIAGNOSIS — K219 Gastro-esophageal reflux disease without esophagitis: Secondary | ICD-10-CM | POA: Diagnosis not present

## 2016-06-14 DIAGNOSIS — Z6841 Body Mass Index (BMI) 40.0 and over, adult: Secondary | ICD-10-CM | POA: Diagnosis not present

## 2016-06-14 DIAGNOSIS — M79606 Pain in leg, unspecified: Secondary | ICD-10-CM | POA: Diagnosis not present

## 2016-06-14 MED ORDER — CYCLOBENZAPRINE HCL 10 MG PO TABS: 10.0000 mg | ORAL_TABLET | Freq: Every evening | ORAL | 2 refills | Status: DC | PRN

## 2016-06-14 MED ORDER — OMEPRAZOLE 40 MG PO CPDR: 40.0000 mg | DELAYED_RELEASE_CAPSULE | Freq: Every day | ORAL | 3 refills | Status: DC

## 2016-06-14 MED ORDER — PHENTERMINE HCL 37.5 MG PO TABS: 37.5000 mg | ORAL_TABLET | Freq: Every day | ORAL | 1 refills | Status: DC

## 2016-06-14 NOTE — Progress Notes (Signed)
  Subjective:   Patient ID: Adriana Moon, female    DOB: 09-04-1980, 36 y.o.   MRN: [id number] CC: Weight Check and Blood Pressure Check  HPI: Adriana Moon is a 36 y.o. female presenting for Weight Check and Blood Pressure Check  Elevated BMI: On phentermine, started two mo ago Thinks it has been helping with appetite suppression Has been pleased with weightloss so far  Says she has had uterine ablation Cannot get pregnent  Reflux: Famotidine helped some, but ongoing symptoms  Has ongoing pains in her legs No back injuries, has been on flexeril in the past Doesn't take regularly but helps a lot when pain comes on Tends to be worse at the end of the day  Relevant past medical, surgical, family and social history reviewed. Allergies and medications reviewed and updated. History  Smoking Status  . Never Smoker  Smokeless Tobacco  . Never Used   ROS: Per HPI   Objective:    BP 106/69   Pulse 88   Temp 97.9 F (36.6 C) (Oral)   Ht 5' (1.524 m)   Wt 240 lb 12.8 oz (109.2 kg)   BMI 47.03 kg/m   Wt Readings from Last 3 Encounters:  06/14/16 240 lb 12.8 oz (109.2 kg)  05/21/16 236 lb 9.6 oz (107.3 kg)  04/20/16 248 lb 9.6 oz (112.8 kg)    Gen: NAD, alert, cooperative with exam, NCAT EYES: EOMI, no conjunctival injection, or no icterus ENT:  TMs pearly gray b/l, OP without erythema LYMPH: no cervical LAD CV: NRRR, normal S1/S2, no murmur, distal pulses 2+ b/l Resp: CTABL, no wheezes, normal WOB Abd: +BS, soft, NTND. no guarding or organomegaly Ext: No edema, warm Neuro: Alert and oriented, strength equal b/l UE and LE, coordination grossly normal  Assessment & Plan:  Adriana Moon was seen today for weight check and blood pressure check.  Diagnoses and all orders for this visit:  Pain of lower extremity, unspecified laterality Not clear etiology, will cont to address Flexeril helps when pain severe, has been  On it for a while Will continue for now -      cyclobenzaprine (FLEXERIL) 10 MG tablet; Take 1 tablet (10 mg total) by mouth at bedtime as needed for muscle spasms.  Gastroesophageal reflux disease, esophagitis presence not specified Not controlled, stop famotidine, start PPI -     omeprazole (PRILOSEC) 40 MG capsule; Take 1 capsule (40 mg total) by mouth daily.  Severe obesity (BMI >= 40) (HCC) Weight down 8 lbs from last visit improving Making like style changes, avoiding carbohydrates, exercising regularly Has been very pleased with changes she is making No palpitations, other s/e from medication Has been on it two months, continue RTC 2 months for recheck S/p uterine ablation -     phentermine (ADIPEX-P) 37.5 MG tablet; Take 1 tablet (37.5 mg total) by mouth daily before breakfast.  BMI 45.0-49.9, adult (HCC) -     phentermine (ADIPEX-P) 37.5 MG tablet; Take 1 tablet (37.5 mg total) by mouth daily before breakfast.   Follow up plan: Return in about 8 weeks (around 08/09/2016). [name], MD [place]

## 2016-06-21 ENCOUNTER — Ambulatory Visit: Payer: BLUE CROSS/BLUE SHIELD | Admitting: Gastroenterology

## 2016-06-25 DIAGNOSIS — K219 Gastro-esophageal reflux disease without esophagitis: Secondary | ICD-10-CM | POA: Diagnosis not present

## 2016-06-25 DIAGNOSIS — R1013 Epigastric pain: Secondary | ICD-10-CM | POA: Diagnosis not present

## 2016-06-25 DIAGNOSIS — R1310 Dysphagia, unspecified: Secondary | ICD-10-CM | POA: Diagnosis not present

## 2016-07-16 NOTE — Telephone Encounter (Signed)
Noted  

## 2016-07-26 ENCOUNTER — Ambulatory Visit (INDEPENDENT_AMBULATORY_CARE_PROVIDER_SITE_OTHER): Payer: BLUE CROSS/BLUE SHIELD | Admitting: Pediatrics

## 2016-07-26 VITALS — BP 110/73 | HR 92 | Temp 98.1°F | Ht 60.0 in | Wt 244.2 lb

## 2016-07-26 DIAGNOSIS — J309 Allergic rhinitis, unspecified: Secondary | ICD-10-CM

## 2016-07-26 DIAGNOSIS — R599 Enlarged lymph nodes, unspecified: Secondary | ICD-10-CM | POA: Diagnosis not present

## 2016-07-26 MED ORDER — FLUTICASONE PROPIONATE 50 MCG/ACT NA SUSP: 2.0000 | Freq: Every day | NASAL | 6 refills | Status: AC

## 2016-07-26 NOTE — Progress Notes (Signed)
  Subjective:   Patient ID: Adriana Moon, female    DOB: Oct 28, 1980, 36 y.o.   MRN: [id number] CC: knot behind in ear (Left side) and knot on neck (right side)  HPI: Adriana Moon is a 36 y.o. female presenting for knot behind in ear (Left side) and knot on neck (right side)  Ongoing for past week Thinks getting smaller now Has h/o breast cancer no lumps anywhere else that she has noticed  Ongoing allergy symptoms, taking anithistamines regularly Still with congestion No fevers No sinus pain Has allergy symptoms all year round  Relevant past medical, surgical, family and social history reviewed. Allergies and medications reviewed and updated. History  Smoking Status  . Never Smoker  Smokeless Tobacco  . Never Used   ROS: Per HPI   Objective:    BP 110/73   Pulse 92   Temp 98.1 F (36.7 C) (Oral)   Ht 5' (1.524 m)   Wt 244 lb 3.2 oz (110.8 kg)   BMI 47.69 kg/m   Wt Readings from Last 3 Encounters:  07/26/16 244 lb 3.2 oz (110.8 kg)  06/14/16 240 lb 12.8 oz (109.2 kg)  05/21/16 236 lb 9.6 oz (107.3 kg)    Gen: NAD, alert, cooperative with exam, NCAT EYES: EOMI, no conjunctival injection, or no icterus ENT:  TMs pearly gray b/l, OP without erythema LYMPH: R post-auricular LN apprx 0.5cm, posterior L cervical LN apprx 1 cm, no other cervical LAD CV: NRRR, normal S1/S2, no murmur, distal pulses 2+ b/l Resp: CTABL, no wheezes, normal WOB Abd: +BS, soft, NTND. no guarding or organomegaly Ext: No edema, warm Neuro: Alert and oriented  Assessment & Plan:  Adriana Moon was seen today for knot behind in ear and knot on neck.  Diagnoses and all orders for this visit:  Allergic rhinitis, unspecified chronicity, unspecified seasonality, unspecified trigger Cont anithistamine, start flonase -     fluticasone (FLONASE) 50 MCG/ACT nasal spray; Place 2 sprays into both nostrils daily. -     Ambulatory referral to Allergy  Lymph node enlargement Present for about  a week Not tender, not red Expect will improve as upper resp symptoms improve Any new LN let me know  Follow up plan: 2 mo [name], MD [place]

## 2016-08-28 ENCOUNTER — Other Ambulatory Visit: Payer: Self-pay | Admitting: Pediatrics

## 2016-08-28 DIAGNOSIS — Z91013 Allergy to seafood: Secondary | ICD-10-CM

## 2016-08-28 MED ORDER — EPINEPHRINE 0.3 MG/0.3ML IJ SOAJ: 0.3000 mg | Freq: Once | INTRAMUSCULAR | 0 refills | Status: AC

## 2016-08-28 NOTE — Telephone Encounter (Signed)
Left message stating that refill has been sent to pharmacy and if patient uses epi penn she must go to ED.

## 2016-08-28 NOTE — Telephone Encounter (Signed)
What is the name of the medication? Epi Pen  Have you contacted your pharmacy to request a refill? NO  Which pharmacy would you like this sent to? [facility] in [place]   Patient notified that their request is being sent to the clinical staff for review and that they should receive a call once it is complete. If they do not receive a call within 24 hours they can check with their pharmacy or our office.

## 2016-08-28 NOTE — Telephone Encounter (Signed)
Refill sent in. If she uses epi pen MUST go to ED. Epipen may not last long enough to treat reaction.

## 2016-09-21 ENCOUNTER — Encounter: Payer: Self-pay | Admitting: Pediatrics

## 2016-09-21 ENCOUNTER — Ambulatory Visit (INDEPENDENT_AMBULATORY_CARE_PROVIDER_SITE_OTHER): Payer: BLUE CROSS/BLUE SHIELD | Admitting: Pediatrics

## 2016-09-21 VITALS — BP 106/74 | HR 87 | Temp 98.3°F | Ht 60.0 in | Wt 247.6 lb

## 2016-09-21 DIAGNOSIS — F419 Anxiety disorder, unspecified: Secondary | ICD-10-CM

## 2016-09-21 DIAGNOSIS — Z6841 Body Mass Index (BMI) 40.0 and over, adult: Secondary | ICD-10-CM | POA: Diagnosis not present

## 2016-09-21 MED ORDER — ESCITALOPRAM OXALATE 10 MG PO TABS: 10.0000 mg | ORAL_TABLET | Freq: Every day | ORAL | 3 refills | Status: DC

## 2016-09-21 NOTE — Patient Instructions (Addendum)
[email or web address]  Your provider wants you to schedule an appointment with a Psychologist/Psychiatrist. The following list of offices requires the patient to call and make their own appointment, as there is information they need that only you can provide. Please feel free to choose form the following providers:  [facility]   [phone] Crisis Recovery in [place] [phone]  [facility]  [phone]   [address] [place], [place]  (Scheduled through [facility]) Must call and do an interview for appointment. Sees Children / Accepts Medicaid  [facility]    [phone]  [address], [address]    [place], [place]       [facility]  [phone] [address] [place], [place]  Evaluates for Autism but does not treat it Sees Children / Accepts Medicaid  [facility]    [phone] [address], [address]   [place], [place] Medication management, substance abuse, bipolar, grief, family, marriage, OCD, anxiety, PTSD Sees children / Accepts Medicaid  [facility]    [phone] [address] [place], [place] Sees children / Accepts Medicaid  [facility]  [phone] [address] [place], [place]   Dr Akinlayo     [phone] [address] [place], [place]  Sees ADD & ADHD for treatment Accepts Medicaid  [facility]  [phone] [address] [place], [place] Evaluates for Autism Accepts Medicaid  [facility]  [phone] [address] [place], [place]  Does Adult ADD evaluations Does not accept Medicaid  [facility]   [phone] [address]   , [place] Uses animal therapy  Sees children as young as 3 years old Accepts Medicaid  [facility]     [phone]    [address]  [place],  [address] Sees children Accepts Medicaid   

## 2016-09-21 NOTE — Progress Notes (Signed)
  Subjective:   Patient ID: Adriana Moon, female    DOB: [birth year], 36 y.o.   MRN: [id number] CC: Dizziness and Anxiety  HPI: Adriana Moon is a 36 y.o. female presenting for Dizziness and Anxiety  Says she has had anxiety going on for years Never taken anything for it Driving, going outside, if she gets far from home feels "like she is going to die" Not any worse since starting phentermine Has 35yo and 15yo at home Recently went on car trip to visit friend 5 hrs away, had panic attack while driving, sudden anxious feeling, felt heart racing, SOB, dizzy at the time, all symptoms went away within minutes but had to pull over and stop the car Doesn't want to be on any kind of medication that causes addiction  Depression screen [facility] 2/9 09/21/2016 09/21/2016 07/26/2016 06/14/2016 04/20/2016  Decreased Interest 0 0 0 0 0  Down, Depressed, Hopeless 0 0 0 0 0  PHQ - 2 Score 0 0 0 0 0  Altered sleeping 0 0 0 0 0  Tired, decreased energy 0 0 0 0 0  Change in appetite 0 0 0 0 0  Feeling bad or failure about yourself  0 0 0 0 0  Trouble concentrating 3 0 0 0 0  Moving slowly or fidgety/restless 0 0 0 0 0  Suicidal thoughts 0 0 0 0 0  PHQ-9 Score 3 0 0 0 0   GAD 7 : Generalized Anxiety Score 09/21/2016  Nervous, Anxious, on Edge 3  Control/stop worrying 3  Worry too much - different things 3  Trouble relaxing 2  Restless 2  Easily annoyed or irritable 2  Afraid - awful might happen 3  Total GAD 7 Score 18  Anxiety Difficulty Very difficult    Relevant past medical, surgical, family and social history reviewed. Allergies and medications reviewed and updated. History  Smoking Status  . Never Smoker  Smokeless Tobacco  . Never Used   ROS: Per HPI   Objective:    BP 106/74   Pulse 87   Temp 98.3 F (36.8 C) (Oral)   Ht 5' (1.524 m)   Wt 247 lb 9.6 oz (112.3 kg)   BMI 48.36 kg/m   Wt Readings from Last 3 Encounters:  09/21/16 247 lb 9.6 oz (112.3 kg)  07/26/16 244  lb 3.2 oz (110.8 kg)  06/14/16 240 lb 12.8 oz (109.2 kg)    Gen: NAD, alert, cooperative with exam, NCAT EYES: EOMI, no conjunctival injection, or no icterus ENT:   OP without erythema LYMPH: no cervical LAD CV: NRRR, normal S1/S2, no murmur Resp: CTABL, no wheezes, normal WOB Abd: +BS, soft, NTND.  Ext: No edema, warm Neuro: Alert and oriented Psych: tearful at times, full affect, no thoughts of self harm  Assessment & Plan:  Casmira was seen today for dizziness and anxiety.   Diagnoses and all orders for this visit:  Anxiety New problem Associated with leaving the house Feels safe at home Will start below Gave list of counselors in area, pt says she feels comfortable calling to set up appt -     escitalopram (LEXAPRO) 10 MG tablet; Take 1 tablet (10 mg total) by mouth daily.  BMI 45.0-49.9, adult (HCC) Cont lifestyle changes. Stop phenteramine for a week to see if helps decrease anxiety symptoms  Follow up plan: Return in about 4 weeks (around 10/19/2016). [name], MD [place]

## 2016-10-01 ENCOUNTER — Ambulatory Visit: Payer: BLUE CROSS/BLUE SHIELD | Admitting: Allergy & Immunology

## 2016-12-31 ENCOUNTER — Other Ambulatory Visit: Payer: Self-pay | Admitting: Pediatrics

## 2016-12-31 DIAGNOSIS — K219 Gastro-esophageal reflux disease without esophagitis: Secondary | ICD-10-CM

## 2017-02-11 ENCOUNTER — Other Ambulatory Visit: Payer: Self-pay | Admitting: Pediatrics

## 2017-02-11 DIAGNOSIS — F419 Anxiety disorder, unspecified: Secondary | ICD-10-CM

## 2017-02-12 DIAGNOSIS — Z6841 Body Mass Index (BMI) 40.0 and over, adult: Secondary | ICD-10-CM | POA: Diagnosis not present

## 2017-02-12 DIAGNOSIS — J069 Acute upper respiratory infection, unspecified: Secondary | ICD-10-CM | POA: Diagnosis not present

## 2017-02-12 DIAGNOSIS — H1033 Unspecified acute conjunctivitis, bilateral: Secondary | ICD-10-CM | POA: Diagnosis not present

## 2017-02-12 NOTE — Telephone Encounter (Signed)
Last seen 09/21/16  Dr Evette Doffing

## 2017-02-21 ENCOUNTER — Ambulatory Visit (INDEPENDENT_AMBULATORY_CARE_PROVIDER_SITE_OTHER): Payer: BLUE CROSS/BLUE SHIELD | Admitting: Pediatrics

## 2017-02-21 ENCOUNTER — Encounter: Payer: Self-pay | Admitting: Pediatrics

## 2017-02-21 VITALS — BP 109/80 | HR 65 | Temp 97.6°F | Resp 20 | Ht 60.0 in | Wt 271.8 lb

## 2017-02-21 DIAGNOSIS — Z6841 Body Mass Index (BMI) 40.0 and over, adult: Secondary | ICD-10-CM | POA: Diagnosis not present

## 2017-02-21 DIAGNOSIS — J4520 Mild intermittent asthma, uncomplicated: Secondary | ICD-10-CM

## 2017-02-21 MED ORDER — PHENTERMINE-TOPIRAMATE ER 3.75-23 MG PO CP24: ORAL_CAPSULE | ORAL | 0 refills | Status: DC

## 2017-02-21 MED ORDER — ALBUTEROL SULFATE HFA 108 (90 BASE) MCG/ACT IN AERS: 2.0000 | INHALATION_SPRAY | Freq: Four times a day (QID) | RESPIRATORY_TRACT | 2 refills | Status: DC | PRN

## 2017-02-21 MED ORDER — SPACER/AERO CHAMBER MOUTHPIECE MISC: 1.0000 | Freq: Four times a day (QID) | 0 refills | Status: AC | PRN

## 2017-02-21 NOTE — Progress Notes (Signed)
  Subjective:   Patient ID: Adriana Moon, female    DOB: 1980-08-16, 36 y.o.   MRN: [id number] CC: Cough; Shortness of Breath; and Chest Congestion  HPI: Adriana Moon is a 36 y.o. female presenting for Cough; Shortness of Breath; and Chest Congestion   has had some cough, runny nose, for past week Feels like she is wheezing at times with exertion No fevers Non-productive cough  Weight has been up Lots of stress at home and at work Eating when feeling stressed, lots of unhealthy snacks at work, says hard to get away from them  Relevant past medical, surgical, family and social history reviewed. Allergies and medications reviewed and updated. History  Smoking Status  . Never Smoker  Smokeless Tobacco  . Never Used   ROS: Per HPI   Objective:    BP 109/80   Pulse 65   Temp 97.6 F (36.4 C) (Oral)   Resp 20   Ht 5' (1.524 m)   Wt 271 lb 12.8 oz (123.3 kg)   SpO2 100%   BMI 53.08 kg/m   Wt Readings from Last 3 Encounters:  02/21/17 271 lb 12.8 oz (123.3 kg)  09/21/16 247 lb 9.6 oz (112.3 kg)  07/26/16 244 lb 3.2 oz (110.8 kg)    Gen: NAD, alert, cooperative with exam, NCAT EYES: EOMI, no conjunctival injection, or no icterus ENT:  TMs pearly gray b/l, OP without erythema LYMPH: no cervical LAD CV: NRRR, normal S1/S2, no murmur, distal pulses 2+ b/l Resp: slight wheeze with forced exhalation, no crackles, normal WOB Abd: +BS, soft, NTND. no guarding or organomegaly Ext: No edema, warm Neuro: Alert and oriented, strength equal b/l UE and LE, coordination grossly normal MSK: normal muscle bulk  Assessment & Plan:  Adriana Moon was seen today for cough, shortness of breath and chest congestion.  Diagnoses and all orders for this visit:  Mild intermittent asthma without complication Slight wheezing today, recent URI symptoms Start below -     albuterol (PROVENTIL HFA;VENTOLIN HFA) 108 (90 Base) MCG/ACT inhaler; Inhale 2 puffs into the lungs every 6 (six)  hours as needed for wheezing or shortness of breath. -     Spacer/Aero Chamber Mouthpiece MISC; 1 each by Does not apply route every 6 (six) hours as needed.  BMI 50.0-59.9, adult (HCC) Weight loss strategies discussed Cont to stay active Avoid snacking Stress relief discussed Trial of below, rtc 4 weeks -     Phentermine-Topiramate 3.75-23 MG CP24; Take one tab in the morning for two weeks, then take two tabs in the morning -     Amb Ref to Medical Weight Management   Follow up plan: Return in about 4 weeks (around 03/21/2017). [name], MD [place]

## 2017-02-28 ENCOUNTER — Telehealth: Payer: Self-pay | Admitting: Pediatrics

## 2017-02-28 MED ORDER — AZITHROMYCIN 250 MG PO TABS: ORAL_TABLET | ORAL | 0 refills | Status: DC

## 2017-02-28 NOTE — Telephone Encounter (Signed)
Patient aware that antibiotic has been sent to pharmacy

## 2017-03-15 ENCOUNTER — Ambulatory Visit (INDEPENDENT_AMBULATORY_CARE_PROVIDER_SITE_OTHER): Payer: BLUE CROSS/BLUE SHIELD

## 2017-03-15 ENCOUNTER — Encounter: Payer: Self-pay | Admitting: Pediatrics

## 2017-03-15 ENCOUNTER — Ambulatory Visit: Payer: BLUE CROSS/BLUE SHIELD | Admitting: Pediatrics

## 2017-03-15 VITALS — BP 114/72 | HR 85 | Temp 98.6°F | Resp 20 | Ht 60.0 in | Wt 277.6 lb

## 2017-03-15 DIAGNOSIS — R059 Cough, unspecified: Secondary | ICD-10-CM

## 2017-03-15 DIAGNOSIS — J4520 Mild intermittent asthma, uncomplicated: Secondary | ICD-10-CM | POA: Diagnosis not present

## 2017-03-15 DIAGNOSIS — R05 Cough: Secondary | ICD-10-CM

## 2017-03-15 MED ORDER — MONTELUKAST SODIUM 10 MG PO TABS: 10.0000 mg | ORAL_TABLET | Freq: Every day | ORAL | 3 refills | Status: DC

## 2017-03-15 NOTE — Progress Notes (Signed)
  Subjective:   Patient ID: Adriana Moon, female    DOB: [birth year], 36 y.o.   MRN: [id number] CC: Cough; Nasal Congestion; and Generalized Body Aches  HPI: Adriana Moon is a 36 y.o. female presenting for Cough; Nasal Congestion; and Generalized Body Aches  Symptoms ongoing for about a month Wheezing initially, treated with albuterol No improvement, treated with azithromycin Not able to take PO steroids due to side effects  Now continues coughing all the time, never goes away  Has taken DM OTC for coughing, improves cough for a couple of hours then returns Using albuterol every six hours, also improves cough for a few minutes but doesn't go away completely Last albuterol this morning  No fevers Appetite has been ok  Relevant past medical, surgical, family and social history reviewed. Allergies and medications reviewed and updated. Social History   Tobacco Use  Smoking Status Never Smoker  Smokeless Tobacco Never Used   ROS: Per HPI   Objective:    BP 114/72   Pulse 85   Temp 98.6 F (37 C) (Oral)   Resp 20   Ht 5' (1.524 m)   Wt 277 lb 9.6 oz (125.9 kg)   SpO2 100%   BMI 54.22 kg/m   Wt Readings from Last 3 Encounters:  03/15/17 277 lb 9.6 oz (125.9 kg)  02/21/17 271 lb 12.8 oz (123.3 kg)  09/21/16 247 lb 9.6 oz (112.3 kg)    Gen: NAD, alert, cooperative with exam, NCAT, dry cough EYES: EOMI, no conjunctival injection, or no icterus ENT:  TMs pearly gray b/l, OP without erythema CV: NRRR, normal S1/S2, no murmur, distal pulses 2+ b/l Resp: no wheezes or crackles, normal WOB Abd: +BS, soft, NTND.  Ext: No edema, warm Neuro: Alert and oriented  Assessment & Plan:  [name] was seen today for cough, nasal congestion and generalized body aches.  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  Cough Given cough for 4 weeks , got cxr No acute process on xr -     DG Chest 2 View; Future   Follow up plan: As scheduled [name], MD [place]

## 2017-03-17 ENCOUNTER — Encounter: Payer: Self-pay | Admitting: Pediatrics

## 2017-03-22 ENCOUNTER — Ambulatory Visit: Payer: BLUE CROSS/BLUE SHIELD | Admitting: Pediatrics

## 2017-03-22 ENCOUNTER — Telehealth: Payer: Self-pay | Admitting: Pediatrics

## 2017-03-22 ENCOUNTER — Other Ambulatory Visit: Payer: Self-pay | Admitting: Pediatrics

## 2017-03-22 DIAGNOSIS — Z6841 Body Mass Index (BMI) 40.0 and over, adult: Secondary | ICD-10-CM

## 2017-03-22 MED ORDER — PHENTERMINE-TOPIRAMATE ER 7.5-46 MG PO CP24: ORAL_CAPSULE | ORAL | 2 refills | Status: DC

## 2017-03-22 MED ORDER — PHENTERMINE-TOPIRAMATE ER 7.5-46 MG PO CP24: ORAL_CAPSULE | ORAL | 0 refills | Status: DC

## 2017-03-22 NOTE — Telephone Encounter (Signed)
dont seen on med list

## 2017-03-22 NOTE — Telephone Encounter (Signed)
Attempted to contact patient to let her know that Rx is ready for pick up.  She will also need to be seen in 4 weeks for next refill. NO Answer

## 2017-03-22 NOTE — Telephone Encounter (Signed)
What is the name of the medication? QYSMIA  Have you contacted your pharmacy to request a refill? yes  Which pharmacy would you like this sent to? [facility]   Patient notified that their request is being sent to the clinical staff for review and that they should receive a call once it is complete. If they do not receive a call within 24 hours they can check with their pharmacy or our office.

## 2017-03-25 ENCOUNTER — Encounter: Payer: Self-pay | Admitting: Pediatrics

## 2017-03-28 ENCOUNTER — Other Ambulatory Visit: Payer: Self-pay | Admitting: Pediatrics

## 2017-03-29 NOTE — Telephone Encounter (Signed)
Printed out #30 days for her, did she pick it up? Needs follow up appt for more.

## 2017-03-29 NOTE — Telephone Encounter (Signed)
Last filled 02/28/17

## 2017-04-05 ENCOUNTER — Other Ambulatory Visit: Payer: Self-pay | Admitting: Obstetrics and Gynecology

## 2017-04-05 DIAGNOSIS — Z1231 Encounter for screening mammogram for malignant neoplasm of breast: Secondary | ICD-10-CM

## 2017-04-12 ENCOUNTER — Ambulatory Visit
Admission: RE | Admit: 2017-04-12 | Discharge: 2017-04-12 | Disposition: A | Discharge status: Home / Self Care | Payer: BLUE CROSS/BLUE SHIELD | Source: Ambulatory Visit | Attending: Obstetrics and Gynecology | Admitting: Obstetrics and Gynecology

## 2017-04-12 DIAGNOSIS — Z1231 Encounter for screening mammogram for malignant neoplasm of breast: Secondary | ICD-10-CM | POA: Diagnosis not present

## 2017-04-19 ENCOUNTER — Ambulatory Visit: Payer: BLUE CROSS/BLUE SHIELD

## 2017-05-08 ENCOUNTER — Ambulatory Visit: Payer: BLUE CROSS/BLUE SHIELD | Admitting: Pediatrics

## 2017-05-08 ENCOUNTER — Encounter: Payer: Self-pay | Admitting: Pediatrics

## 2017-05-08 VITALS — BP 105/74 | HR 70 | Temp 97.0°F | Ht 60.0 in | Wt 278.0 lb

## 2017-05-08 DIAGNOSIS — Z0000 Encounter for general adult medical examination without abnormal findings: Secondary | ICD-10-CM

## 2017-05-08 DIAGNOSIS — J4530 Mild persistent asthma, uncomplicated: Secondary | ICD-10-CM

## 2017-05-08 MED ORDER — FLUTICASONE FUROATE-VILANTEROL 100-25 MCG/INH IN AEPB: 1.0000 | INHALATION_SPRAY | Freq: Every day | RESPIRATORY_TRACT | 2 refills | Status: DC

## 2017-05-08 NOTE — Patient Instructions (Addendum)
[phone] Dr Dennard Nip for weight management

## 2017-05-08 NOTE — Progress Notes (Signed)
  Subjective:   Patient ID: Adriana Moon, female    DOB: 02-Oct-1980, 37 y.o.   MRN: [id number] CC: Well exam HPI: Adriana Moon is a 37 y.o. 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  Otherwise has been feeling well.  Remains active at work.  Relevant past medical, surgical, family and social history reviewed. Allergies and medications reviewed and updated. Social History   Tobacco Use  Smoking Status Never Smoker  Smokeless Tobacco Never Used   ROS: Per HPI   Objective:    BP 105/74   Pulse 70   Temp (!) 97 F (36.1 C) (Oral)   Ht 5' (1.524 m)   Wt 278 lb (126.1 kg)   BMI 54.29 kg/m   Wt Readings from Last 3 Encounters:  05/08/17 278 lb (126.1 kg)  03/15/17 277 lb 9.6 oz (125.9 kg)  02/21/17 271 lb 12.8 oz (123.3 kg)    Gen: NAD, alert, cooperative with exam, NCAT EYES: EOMI, no conjunctival injection, or no icterus ENT:  TMs pearly gray b/l, OP without erythema LYMPH: no cervical LAD CV: NRRR, normal S1/S2, no murmur, distal pulses 2+ b/l Resp: CTABL, no wheezes, normal WOB Abd: +BS, soft, NTND. no guarding or organomegaly Ext: No edema, warm Neuro: Alert and oriented, strength equal b/l UE and LE, coordination grossly normal MSK: normal muscle bulk  Assessment & Plan:  Adriana Moon was seen today for annual.  Diagnoses and all orders for this visit:  Encounter for preventive care Patient plans to follow-up with weight management -     CMP14+EGFR -     Lipid panel -     TSH  Mild  persistent asthma without complication Restart Brio, will have patient follow-up with allergy  -     Ambulatory referral to Allergy -     fluticasone furoate-vilanterol (BREO ELLIPTA) 100-25 MCG/INH AEPB; Inhale 1 puff into the lungs daily.   Follow up plan: Return in about 3 months (around 08/06/2017). Adriana Found, MD [place]

## 2017-05-09 ENCOUNTER — Other Ambulatory Visit: Payer: Self-pay | Admitting: Pediatrics

## 2017-05-09 ENCOUNTER — Encounter: Payer: Self-pay | Admitting: Pediatrics

## 2017-05-09 DIAGNOSIS — K219 Gastro-esophageal reflux disease without esophagitis: Secondary | ICD-10-CM

## 2017-05-09 LAB — CMP14+EGFR
ALBUMIN: 4.1 g/dL (ref 3.5–5.5)
ALK PHOS: 52 IU/L (ref 39–117)
ALT: 13 IU/L (ref 0–32)
AST: 17 IU/L (ref 0–40)
Albumin/Globulin Ratio: 1.3 (ref 1.2–2.2)
BILIRUBIN TOTAL: 0.5 mg/dL (ref 0.0–1.2)
BUN / CREAT RATIO: 13 (ref 9–23)
BUN: 13 mg/dL (ref 6–20)
CO2: 24 mmol/L (ref 20–29)
CREATININE: 0.99 mg/dL (ref 0.57–1.00)
Calcium: 9 mg/dL (ref 8.7–10.2)
Chloride: 101 mmol/L (ref 96–106)
GFR calc Af Amer: 85 mL/min/{1.73_m2} (ref >59)
GFR calc non Af Amer: 74 mL/min/{1.73_m2} (ref >59)
GLUCOSE: 80 mg/dL (ref 65–99)
Globulin, Total: 3.1 g/dL (ref 1.5–4.5)
Potassium: 4 mmol/L (ref 3.5–5.2)
Sodium: 139 mmol/L (ref 134–144)
Total Protein: 7.2 g/dL (ref 6.0–8.5)

## 2017-05-09 LAB — LIPID PANEL
CHOLESTEROL TOTAL: 148 mg/dL (ref 100–199)
Chol/HDL Ratio: 2.3 ratio (ref 0.0–4.4)
HDL: 63 mg/dL (ref >39)
LDL CALC: 76 mg/dL (ref 0–99)
TRIGLYCERIDES: 44 mg/dL (ref 0–149)
VLDL CHOLESTEROL CAL: 9 mg/dL (ref 5–40)

## 2017-05-09 LAB — TSH: TSH: 2.64 u[IU]/mL (ref 0.450–4.500)

## 2017-05-21 DIAGNOSIS — M79672 Pain in left foot: Secondary | ICD-10-CM | POA: Diagnosis not present

## 2017-05-21 DIAGNOSIS — M7661 Achilles tendinitis, right leg: Secondary | ICD-10-CM | POA: Diagnosis not present

## 2017-06-14 ENCOUNTER — Encounter: Payer: Self-pay | Admitting: Physician Assistant

## 2017-06-14 ENCOUNTER — Ambulatory Visit: Payer: BLUE CROSS/BLUE SHIELD | Admitting: Physician Assistant

## 2017-06-14 VITALS — BP 103/73 | HR 76 | Temp 99.2°F | Ht 60.0 in | Wt 280.6 lb

## 2017-06-14 DIAGNOSIS — L03211 Cellulitis of face: Secondary | ICD-10-CM | POA: Diagnosis not present

## 2017-06-14 DIAGNOSIS — R21 Rash and other nonspecific skin eruption: Secondary | ICD-10-CM | POA: Diagnosis not present

## 2017-06-14 DIAGNOSIS — T7840XA Allergy, unspecified, initial encounter: Secondary | ICD-10-CM

## 2017-06-14 MED ORDER — CEPHALEXIN 500 MG PO CAPS: 500.0000 mg | ORAL_CAPSULE | Freq: Three times a day (TID) | ORAL | 0 refills | Status: DC

## 2017-06-14 MED ORDER — FLUCONAZOLE 150 MG PO TABS: 150.0000 mg | ORAL_TABLET | Freq: Once | ORAL | 0 refills | Status: AC

## 2017-06-14 NOTE — Patient Instructions (Signed)

## 2017-06-17 NOTE — Progress Notes (Signed)
BP 103/73   Pulse 76   Temp 99.2 F (37.3 C) (Oral)   Ht 5' (1.524 m)   Wt 280 lb 9.6 oz (127.3 kg)   BMI 54.80 kg/m    Subjective:    Patient ID: Adriana Moon, female    DOB: [birth year], 37 y.o.   MRN: [id number]  HPI: Adriana Moon is a 37 y.o. female presenting on 06/14/2017 for Oral Swelling    Past Medical History:  Diagnosis Date  . Arthritis of both knees   . Breast cancer ([place])   . Migraines    Relevant past medical, surgical, family and social history reviewed and updated as indicated. Interim medical history since our last visit reviewed. Allergies and medications reviewed and updated. DATA REVIEWED: CHART IN EPIC  Family History reviewed for pertinent findings.  Review of Systems  Constitutional: Negative.  Negative for diaphoresis and fatigue.  HENT: Negative.   Eyes: Negative.   Respiratory: Negative for shortness of breath, wheezing and stridor.   Gastrointestinal: Negative.   Genitourinary: Negative.   Skin: Positive for color change and rash.    Allergies as of 06/14/2017      Reactions   Prednisone Swelling   Lost taste in her mouth   Tolmetin Hives, Swelling   Nsaids Hives, Swelling   Shellfish Allergy Hives, Swelling      Medication List        Accurate as of 06/14/17 11:59 PM. Always use your most recent med list.          albuterol 108 (90 Base) MCG/ACT inhaler Commonly known as:  PROVENTIL HFA;VENTOLIN HFA Inhale 2 puffs into the lungs every 6 (six) hours as needed for wheezing or shortness of breath.   cephALEXin 500 MG capsule Commonly known as:  KEFLEX Take 1 capsule (500 mg total) by mouth 3 (three) times daily.   cetirizine 10 MG tablet Commonly known as:  ZYRTEC Take 10 mg by mouth daily.   cyclobenzaprine 10 MG tablet Commonly known as:  FLEXERIL Take 1 tablet (10 mg total) by mouth at bedtime as needed for muscle spasms.   EPINEPHrine 0.3 mg/0.3 mL Soaj injection Commonly known as:  EPI-PEN     escitalopram 10 MG tablet Commonly known as:  LEXAPRO TAKE 1 TABLET BY MOUTH ONCE DAILY   fluconazole 150 MG tablet Commonly known as:  DIFLUCAN Take 1 tablet (150 mg total) by mouth once for 1 dose.   fluticasone 50 MCG/ACT nasal spray Commonly known as:  FLONASE Place 2 sprays into both nostrils daily.   fluticasone furoate-vilanterol 100-25 MCG/INH Aepb Commonly known as:  BREO ELLIPTA Inhale 1 puff into the lungs daily.   montelukast 10 MG tablet Commonly known as:  SINGULAIR Take 1 tablet (10 mg total) by mouth at bedtime.   omeprazole 40 MG capsule Commonly known as:  PRILOSEC TAKE 1 CAPSULE BY MOUTH ONCE DAILY   Spacer/Aero Chamber Mouthpiece Misc 1 each by Does not apply route every 6 (six) hours as needed.          Objective:    BP 103/73   Pulse 76   Temp 99.2 F (37.3 C) (Oral)   Ht 5' (1.524 m)   Wt 280 lb 9.6 oz (127.3 kg)   BMI 54.80 kg/m   Allergies  Allergen Reactions  . Prednisone Swelling    Lost taste in her mouth  . Tolmetin Hives and Swelling  . Nsaids Hives and Swelling  . Shellfish Allergy Hives and Swelling  Wt Readings from Last 3 Encounters:  06/14/17 280 lb 9.6 oz (127.3 kg)  05/08/17 278 lb (126.1 kg)  03/15/17 277 lb 9.6 oz (125.9 kg)    Physical Exam  Constitutional: She is oriented to person, place, and time. She appears well-developed and well-nourished.  HENT:  Head: Normocephalic and atraumatic.  Eyes: Conjunctivae and EOM are normal. Pupils are equal, round, and reactive to light.  Cardiovascular: Normal rate, regular rhythm, normal heart sounds and intact distal pulses.  Pulmonary/Chest: Effort normal and breath sounds normal.  Abdominal: Soft. Bowel sounds are normal.  Neurological: She is alert and oriented to person, place, and time. She has normal reflexes.  Skin: Skin is warm and dry. Rash noted. Rash is urticarial. There is erythema.     Psychiatric: She has a normal mood and affect. Her behavior is  normal. Judgment and thought content normal.        Assessment & Plan:   1. Allergic reaction, initial encounter Continue zyrtec and benadryl  2. Cellulitis of face - cephALEXin (KEFLEX) 500 MG capsule; Take 1 capsule (500 mg total) by mouth 3 (three) times daily.  Dispense: 30 capsule; Refill: 0 - fluconazole (DIFLUCAN) 150 MG tablet; Take 1 tablet (150 mg total) by mouth once for 1 dose.  Dispense: 1 tablet; Refill: 0   Continue all other maintenance medications as listed above.  Follow up plan: No Follow-up on file.  Educational handout given for [place] PA-C [facility] [address]  [place], [place] [address] [phone]   06/17/2017, 10:46 PM

## 2017-06-18 ENCOUNTER — Encounter: Payer: Self-pay | Admitting: *Deleted

## 2017-06-25 ENCOUNTER — Ambulatory Visit: Payer: BLUE CROSS/BLUE SHIELD | Admitting: Allergy and Immunology

## 2017-06-25 ENCOUNTER — Encounter: Payer: Self-pay | Admitting: Allergy and Immunology

## 2017-06-25 VITALS — BP 118/76 | HR 76 | Temp 98.7°F | Resp 20 | Ht 60.0 in | Wt 283.0 lb

## 2017-06-25 DIAGNOSIS — K219 Gastro-esophageal reflux disease without esophagitis: Secondary | ICD-10-CM

## 2017-06-25 DIAGNOSIS — L500 Allergic urticaria: Secondary | ICD-10-CM | POA: Diagnosis not present

## 2017-06-25 DIAGNOSIS — Z23 Encounter for immunization: Secondary | ICD-10-CM

## 2017-06-25 DIAGNOSIS — J3089 Other allergic rhinitis: Secondary | ICD-10-CM | POA: Diagnosis not present

## 2017-06-25 DIAGNOSIS — H1010 Acute atopic conjunctivitis, unspecified eye: Secondary | ICD-10-CM

## 2017-06-25 DIAGNOSIS — T7800XA Anaphylactic reaction due to unspecified food, initial encounter: Secondary | ICD-10-CM

## 2017-06-25 DIAGNOSIS — Z886 Allergy status to analgesic agent status: Secondary | ICD-10-CM | POA: Diagnosis not present

## 2017-06-25 DIAGNOSIS — J4530 Mild persistent asthma, uncomplicated: Secondary | ICD-10-CM

## 2017-06-25 MED ORDER — CETIRIZINE HCL 10 MG PO TABS: ORAL_TABLET | ORAL | 3 refills | Status: AC

## 2017-06-25 MED ORDER — RANITIDINE HCL 300 MG PO CAPS: 300.0000 mg | ORAL_CAPSULE | Freq: Every evening | ORAL | 3 refills | Status: DC

## 2017-06-25 MED ORDER — FLUTICASONE FUROATE 200 MCG/ACT IN AEPB: 1.0000 | INHALATION_SPRAY | Freq: Every day | RESPIRATORY_TRACT | 3 refills | Status: DC

## 2017-06-25 MED ORDER — AUVI-Q 0.3 MG/0.3ML IJ SOAJ: 0.3000 mg | Freq: Once | INTRAMUSCULAR | 3 refills | Status: AC

## 2017-06-25 NOTE — Patient Instructions (Addendum)
  1.  Allergen avoidance measures  2.  Treat and prevent inflammation:   A.  Flonase - 2 sprays each nostril once a day  B.  Arnuity 200 - 1 inhalation once a day  C.  Montelukast 10 mg - one tablet once a day  3.  Treat and prevent reflux:   A.  Slowly taper off all forms of caffeine  B.  Omeprazole 40 mg in a.m.  C.  Ranitidine 300 mg in p.m.  4.  If needed:   A.  Cetirizine 10 mg - 1-2 tablets 1-2 times per day (maximum 40mg )  B.  Pro Air HFA or similar 2 puffs every 4-6 hours  C.  Auvi-Q 0.3, Benadryl, MD/ER evaluation for allergic reaction  5.  Blood -alpha gal panel, shellfish panel, TSH, T4, TP, CBC w/diff  6.  Return to clinic in 3 weeks or earlier if problem  7. Flu vaccine delivered in clinic today

## 2017-06-25 NOTE — Progress Notes (Signed)
Dear Dr. Evette Doffing,  Thank you for referring Adriana Moon to the [facility] on 06/25/2017.   Below is a summation of this patient's evaluation and recommendations.  Thank you for your referral. I will keep you informed about this patient's response to treatment.   If you have any questions please do not hesitate to contact me.   Sincerely,  Jiles Prows, MD Allergy / Immunology [facility]   ______________________________________________________________________    NEW PATIENT NOTE  Referring Provider: Eustaquio Maize, MD Primary Provider: Eustaquio Maize, MD Date of office visit: 06/25/2017    Subjective:   Chief Complaint:  Adriana Moon (DOB: 1981-03-20) is a 37 y.o. female who presents to the clinic on 06/25/2017 with a chief complaint of Asthma (wheezing, shortness of breath); Urticaria (lip swelling); food allergy; and Sinus Problem (congestion, sneezing) .     HPI: [name] presents to this clinic in evaluation of several different issues.  First, she has developed red raised itchy lesions on her body over the course of the past 3 months that usually last several hours and heal without any hyperpigmentation or scar without any obvious trigger.  She will get large patches usually localized to one area of her body like her chest or her legs.  She will take some Benadryl which she thinks does help this issue.  She will occasionally get runny nose and the sensation that she gets fullness in her head when she has 1 of these outbreak.  There is no obvious provoking factor giving rise to this issue.  Second, she has nasal congestion and sneezing and itchy red watery eyes especially following exposure to dust and locating to the outdoors of long-standing nature.  She does get pressure behind her eyes on a regular basis but no throbbing headache.  She can smell and taste and  does not have a history of ugly nasal discharge.  Third, she "coughs all the time".  She has coughing spells associated with micturation but no posttussive emesis.  Provoking factors for the symptoms include exposure to dust and locating to the outdoors and whenever she develops a head cold.  She has been given a Breo and a short acting bronchodilator for this issue which she does not think has helped her very much.  Fourth, she has seen an ENT doctor for throat clearing and itchy throat and scratchy throat.  She has had rhinoscopy performed on the right side only because of the deviated septum on the left and apparently her throat was okay.  She does have heartburn with regurgitation for which she takes omeprazole.  She will will have breakthrough issues with reflux if she eats spicy food.  She does have the head of the bed elevated.  She drinks 32 ounces of coffee per day and minimal amounts of chocolate and ethanol.  Fifth, when eating crab and lobster and shrimp she has developed hives and tongue swelling and vomiting.  She can eat scallops without a problem.  When taking nonsteroidal anti-inflammatory drugs she developed tongue swelling and lip swelling and global urticaria.  Past Medical History:  Diagnosis Date  . Angio-edema   . Arthritis of both knees   . Asthma   . Breast cancer ([place])   . Migraines   . Urticaria     Past Surgical History:  Procedure Laterality Date  . BREAST LUMPECTOMY Right 2009  . BREAST  REDUCTION SURGERY Bilateral 01/25/2015   Procedure: MAMMARY REDUCTION  (BREAST);  Surgeon: [name], MD;  Location: [facility];  Service: Plastics;  Laterality: Bilateral;  . CESAREAN SECTION    . ENDOMETRIAL ABLATION    . REDUCTION MAMMAPLASTY Left   . RT lumpectomy      Allergies as of 06/25/2017      Reactions   Prednisone Swelling   Lost taste in her mouth   Tolmetin Hives, Swelling   Nsaids Hives, Swelling   Shellfish Allergy Hives,  Swelling      Medication List      albuterol 108 (90 Base) MCG/ACT inhaler Commonly known as:  PROVENTIL HFA;VENTOLIN HFA Inhale 2 puffs into the lungs every 6 (six) hours as needed for wheezing or shortness of breath.   cephALEXin 500 MG capsule Commonly known as:  KEFLEX Take 1 capsule (500 mg total) by mouth 3 (three) times daily.   cetirizine 10 MG tablet Commonly known as:  ZYRTEC Take 10 mg by mouth daily.   cyclobenzaprine 10 MG tablet Commonly known as:  FLEXERIL Take 1 tablet (10 mg total) by mouth at bedtime as needed for muscle spasms.   EPINEPHrine 0.3 mg/0.3 mL Soaj injection Commonly known as:  EPI-PEN   escitalopram 10 MG tablet Commonly known as:  LEXAPRO TAKE 1 TABLET BY MOUTH ONCE DAILY   fluticasone 50 MCG/ACT nasal spray Commonly known as:  FLONASE Place 2 sprays into both nostrils daily.   fluticasone furoate-vilanterol 100-25 MCG/INH Aepb Commonly known as:  BREO ELLIPTA Inhale 1 puff into the lungs daily.   montelukast 10 MG tablet Commonly known as:  SINGULAIR Take 1 tablet (10 mg total) by mouth at bedtime.   omeprazole 40 MG capsule Commonly known as:  PRILOSEC TAKE 1 CAPSULE BY MOUTH ONCE DAILY   Spacer/Aero Chamber Mouthpiece Misc 1 each by Does not apply route every 6 (six) hours as needed.       Review of systems negative except as noted in HPI / PMHx or noted below:  Review of Systems  Constitutional: Negative.   HENT: Negative.   Eyes: Negative.   Respiratory: Negative.   Cardiovascular: Negative.   Gastrointestinal: Negative.   Genitourinary: Negative.   Musculoskeletal: Negative.   Skin: Negative.   Neurological: Negative.   Endo/Heme/Allergies: Negative.   Psychiatric/Behavioral: Negative.     Family History  Problem Relation Age of Onset  . Diabetes Father   . Hypertension Father   . Gout Father   . Cancer Paternal Grandmother   . Emphysema Maternal Grandfather     Social History   Socioeconomic History   . Marital status: Legally Separated    Spouse name: Not on file  . Number of children: Not on file  . Years of education: Not on file  . Highest education level: Not on file  Social Needs  . Financial resource strain: Not on file  . Food insecurity - worry: Not on file  . Food insecurity - inability: Not on file  . Transportation needs - medical: Not on file  . Transportation needs - non-medical: Not on file  Occupational History  . Not on file  Tobacco Use  . Smoking status: Never Smoker  . Smokeless tobacco: Never Used  Substance and Sexual Activity  . Alcohol use: Yes    Alcohol/week: 0.0 oz    Comment: occ.   . Drug use: No  . Sexual activity: Yes    Birth control/protection: None  Other Topics  Concern  . Not on file  Social History Narrative  . Not on file    Environmental and Social history  Lives in a house with a dry environment, no animals located inside the household, hardwood in the bedroom, plastic on the bed, plastic on the pillow, and no smokers located inside the household.  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  Objective:   Vitals:   06/25/17 1421  BP: 118/76  Pulse: 76  Resp: 20  Temp: 98.7 F (37.1 C)   Height: 5' (152.4 cm) Weight: 283 lb (128.4 kg)  Physical Exam  Constitutional: She is well-developed, well-nourished, and in no distress.  HENT:  Head: Normocephalic. Head is without right periorbital erythema and without left periorbital erythema.  Right Ear: Tympanic membrane, external ear and ear canal normal.  Left Ear: Tympanic membrane, external ear and ear canal normal.  Nose: Mucosal edema present. No rhinorrhea.  Mouth/Throat: Uvula is midline, oropharynx is clear and moist and mucous membranes are normal. No oropharyngeal exudate.  Eyes: Lids are normal. Pupils are equal, round, and reactive to light. Right conjunctiva is injected. Left conjunctiva is injected.  Neck: Trachea normal. No tracheal tenderness present. No tracheal  deviation present. No thyromegaly present.  Cardiovascular: Normal rate, regular rhythm, S1 normal, S2 normal and normal heart sounds.  No murmur heard. Pulmonary/Chest: Effort normal and breath sounds normal. No stridor. No tachypnea. No respiratory distress. She has no wheezes. She has no rales. She exhibits no tenderness.  Abdominal: Soft. She exhibits no distension and no mass. There is no hepatosplenomegaly. There is no tenderness. There is no rebound and no guarding.  Musculoskeletal: She exhibits no edema or tenderness.  Lymphadenopathy:       Head (right side): No tonsillar adenopathy present.       Head (left side): No tonsillar adenopathy present.    She has no cervical adenopathy.    She has no axillary adenopathy.  Neurological: She is alert. Gait normal.  Skin: No rash noted. She is not diaphoretic. No erythema. No pallor. Nails show no clubbing.  Psychiatric: Mood and affect normal.    Diagnostics: Allergy skin tests were performed.  She demonstrated hypersensitivity to house dust mite, cat, dog, cockroach, weeds, and trees.  She also demonstrated hypersensitivity to shrimp.  Spirometry was performed and demonstrated an FEV1 of 2.06 @ 85 % of predicted. FEV1/FVC = 0.78.  Following administration of nebulized albuterol her FEV1 rose to 2.15 which was an increase in the FEV1 of 4%.   Results of chest x-ray obtained 15 March 2017 identified the following:  The heart size and mediastinal contours are within normal limits. Both lungs are clear. The visualized skeletal structures are unremarkable.   Assessment and Plan:    1. Other allergic rhinitis   2. Seasonal allergic conjunctivitis   3. Not well controlled mild persistent asthma   4. Allergic urticaria   5. Anaphylactic shock due to food, initial encounter   6. History of allergy to NSAID   7. LPRD (laryngopharyngeal reflux disease)   8. Need for immunization against influenza     1.  Allergen avoidance  measures  2.  Treat and prevent inflammation:   A.  Flonase - 2 sprays each nostril once a day  B.  Arnuity 200 - 1 inhalation once a day  C.  Montelukast 10 mg - one tablet once a day  3.  Treat and prevent reflux:   A.  Slowly taper off all forms of  caffeine  B.  Omeprazole 40 mg in a.m.  C.  Ranitidine 300 mg in p.m.  4.  If needed:   A.  Cetirizine 10 mg - 1-2 tablets 1-2 times per day (maximum 40mg )  B.  Pro Air HFA or similar 2 puffs every 4-6 hours  C.  Auvi-Q 0.3, Benadryl, MD/ER evaluation for allergic reaction  5.  Blood -alpha gal panel, shellfish panel, TSH, T4, TP, CBC w/diff  6.  Return to clinic in 3 weeks or earlier if problem  7. Flu vaccine delivered in clinic today  [name] appears to have a significantly active immune system with an atopic phenotype giving rise to inflammation of her respiratory tract and urticaria.  We will get her to perform allergen avoidance measures as best as possible and she will use a collection of anti-inflammatory agents as noted above.  We will further investigate her atopic immune system with the blood tests noted above looking for other forms of immunological hyperreactivity contributing to her problem.  As well, she appears to have significant reflux and I made some suggestions about how to handle that issue.  If we cannot get her reflux under good control then we need to consider the possibility that she may have eosinophilic esophagitis especially given her very atopic immune system.  I will see her back in this clinic in 3 weeks or earlier if there is a problem.  Should she fail medical therapy she would definitely be a candidate for immunotherapy.  Jiles Prows, MD Allergy / Immunology [facility]

## 2017-06-26 ENCOUNTER — Encounter: Payer: Self-pay | Admitting: Allergy and Immunology

## 2017-06-27 ENCOUNTER — Other Ambulatory Visit: Payer: Self-pay

## 2017-06-27 MED ORDER — AUVI-Q 0.3 MG/0.3ML IJ SOAJ: 0.3000 mg | Freq: Once | INTRAMUSCULAR | 1 refills | Status: DC

## 2017-06-27 MED ORDER — AUVI-Q 0.3 MG/0.3ML IJ SOAJ: 0.3000 mg | Freq: Once | INTRAMUSCULAR | 1 refills | Status: AC

## 2017-06-27 NOTE — Telephone Encounter (Signed)
Auvi-Q prescription was sent to [facility] and they are requesting a PA. [facility] am resending to [facility].

## 2017-06-27 NOTE — Addendum Note (Signed)
Addended by: Lucrezia Starch I on: 06/27/2017 01:55 PM   Modules accepted: Orders

## 2017-06-29 LAB — CBC WITH DIFFERENTIAL/PLATELET
BASOS ABS: 0 10*3/uL (ref 0.0–0.2)
BASOS: 0 %
EOS (ABSOLUTE): 0.2 10*3/uL (ref 0.0–0.4)
Eos: 4 %
Hematocrit: 37.2 % (ref 34.0–46.6)
Hemoglobin: 12.4 g/dL (ref 11.1–15.9)
Immature Grans (Abs): 0 10*3/uL (ref 0.0–0.1)
Immature Granulocytes: 0 %
Lymphocytes Absolute: 2.3 10*3/uL (ref 0.7–3.1)
Lymphs: 47 %
MCH: 27.6 pg (ref 26.6–33.0)
MCHC: 33.3 g/dL (ref 31.5–35.7)
MCV: 83 fL (ref 79–97)
MONOS ABS: 0.4 10*3/uL (ref 0.1–0.9)
Monocytes: 8 %
NEUTROS ABS: 2 10*3/uL (ref 1.4–7.0)
Neutrophils: 41 %
PLATELETS: 343 10*3/uL (ref 150–379)
RBC: 4.5 x10E6/uL (ref 3.77–5.28)
RDW: 13.3 % (ref 12.3–15.4)
WBC: 4.8 10*3/uL (ref 3.4–10.8)

## 2017-06-29 LAB — ALLERGEN PROFILE, SHELLFISH
Clam IgE: <0.1 kU/L
F023-IgE Crab: 0.85 kU/L — AB
F080-IGE LOBSTER: 0.65 kU/L — AB
F290-IgE Oyster: <0.1 kU/L
Scallop IgE: <0.1 kU/L
Shrimp IgE: 4.56 kU/L — AB

## 2017-06-29 LAB — TSH+FREE T4
FREE T4: 1.36 ng/dL (ref 0.82–1.77)
TSH: 1.8 u[IU]/mL (ref 0.450–4.500)

## 2017-06-29 LAB — ALPHA-GAL PANEL
Alpha Gal IgE*: <0.1 kU/L (ref <0.10)
Class Interpretation: 0
Class Interpretation: 0
Lamb/Mutton (Ovis spp) IgE: <0.1 kU/L (ref <0.35)
PORK CLASS INTERPRETATION: 0

## 2017-06-29 LAB — THYROID PEROXIDASE ANTIBODY: THYROID PEROXIDASE ANTIBODY: 10 [IU]/mL (ref 0–34)

## 2017-07-31 ENCOUNTER — Other Ambulatory Visit: Payer: Self-pay | Admitting: Pediatrics

## 2017-07-31 DIAGNOSIS — F419 Anxiety disorder, unspecified: Secondary | ICD-10-CM

## 2017-08-07 DIAGNOSIS — M170 Bilateral primary osteoarthritis of knee: Secondary | ICD-10-CM | POA: Diagnosis not present

## 2017-08-12 ENCOUNTER — Other Ambulatory Visit: Payer: BLUE CROSS/BLUE SHIELD | Admitting: Obstetrics and Gynecology

## 2017-08-22 ENCOUNTER — Ambulatory Visit: Payer: BLUE CROSS/BLUE SHIELD | Admitting: Pediatrics

## 2017-08-22 ENCOUNTER — Encounter: Payer: Self-pay | Admitting: Pediatrics

## 2017-08-22 VITALS — BP 111/71 | HR 76 | Temp 97.9°F | Ht 60.0 in | Wt 290.6 lb

## 2017-08-22 DIAGNOSIS — M79672 Pain in left foot: Secondary | ICD-10-CM

## 2017-08-22 DIAGNOSIS — Z6841 Body Mass Index (BMI) 40.0 and over, adult: Secondary | ICD-10-CM

## 2017-08-22 DIAGNOSIS — M79671 Pain in right foot: Secondary | ICD-10-CM | POA: Diagnosis not present

## 2017-08-22 MED ORDER — PHENTERMINE HCL 37.5 MG PO CAPS: 37.5000 mg | ORAL_CAPSULE | ORAL | 2 refills | Status: DC

## 2017-08-22 NOTE — Progress Notes (Signed)
  Subjective:   Patient ID: Adriana Moon, female    DOB: 1980/08/17, 37 y.o.   MRN: [id number] CC: Foot Pain (Bilateral) and Edema (Bilateral)  HPI: Adriana Moon is a 37 y.o. female   Foot pain: Recently got orthotics from [facility].  Helped to the foot pain some, caused a blister in the fifth toe of her left foot.  She stopped wearing them but has since cut them slightly and gone them back in her shoes.  Foot pain in general improved some with using the orthotics, got worse when she stopped, is now started getting better again she thinks.  Has a hard time exercising after work because she is on her feet all day.  She has collapsing arches.  Elevated BMI: Not drinking any sugary drinks.  Likes sweets, ice cream around knees.  Has a 36 year old and 36 year old at home as well.  Open to weight loss referral.  Says insurance will not pay for weight loss surgery when she is investigated in the past.  Relevant past medical, surgical, family and social history reviewed. Allergies and medications reviewed and updated. Social History   Tobacco Use  Smoking Status Never Smoker  Smokeless Tobacco Never Used   ROS: Per HPI   Objective:    BP 111/71   Pulse 76   Temp 97.9 F (36.6 C) (Oral)   Ht 5' (1.524 m)   Wt 290 lb 9.6 oz (131.8 kg)   BMI 56.75 kg/m   Wt Readings from Last 3 Encounters:  08/22/17 290 lb 9.6 oz (131.8 kg)  06/25/17 283 lb (128.4 kg)  06/14/17 280 lb 9.6 oz (127.3 kg)    Gen: NAD, alert, cooperative with exam, NCAT EYES: EOMI, no conjunctival injection, or no icterus CV: NRRR, normal S1/S2, no murmur, distal pulses 2+ b/l Resp: CTABL, no wheezes, normal WOB Abd: +BS, soft, NTND. no guarding or organomegaly Ext: No edema, warm Neuro: Alert and oriented, strength equal b/l UE and LE, coordination grossly normal MSK: normal muscle bulk  Assessment & Plan:  Adriana Moon was seen today for foot pain and edema.  Diagnoses and all orders for this  visit:  Pain in both feet Continue orthotics.  Patient with tongue swelling with NSAIDs, has been using Tylenol regularly.  Hopefully some improvement with foot pain she is getting back to using orthotics recently.  BMI 50.0-59.9, adult [facility]) [facility], [facility] or at [facility].  Goal 20 to 30 minutes of exercise every day.  Discussed lifestyle changes, decreasing overall sugar intake.  Increasing fruits and vegetable intake. -     phentermine 37.5 MG capsule; Take 1 capsule (37.5 mg total) by mouth every morning.   I spent 25 minutes with the patient with over 50% of the encounter time dedicated to counseling on the above problems.  Follow up plan: Return in about 2 months (around 10/22/2017). [name], MD [facility]

## 2017-08-22 NOTE — Patient Instructions (Addendum)
[place] Dr. Dennard Nip

## 2017-08-27 DIAGNOSIS — M7742 Metatarsalgia, left foot: Secondary | ICD-10-CM | POA: Diagnosis not present

## 2017-08-27 DIAGNOSIS — M79671 Pain in right foot: Secondary | ICD-10-CM | POA: Diagnosis not present

## 2017-08-27 DIAGNOSIS — M7741 Metatarsalgia, right foot: Secondary | ICD-10-CM | POA: Diagnosis not present

## 2017-08-27 DIAGNOSIS — M79672 Pain in left foot: Secondary | ICD-10-CM | POA: Diagnosis not present

## 2017-09-06 DIAGNOSIS — M19071 Primary osteoarthritis, right ankle and foot: Secondary | ICD-10-CM | POA: Diagnosis not present

## 2017-09-06 DIAGNOSIS — R600 Localized edema: Secondary | ICD-10-CM | POA: Diagnosis not present

## 2017-09-06 DIAGNOSIS — M79671 Pain in right foot: Secondary | ICD-10-CM | POA: Diagnosis not present

## 2017-09-12 DIAGNOSIS — M84374A Stress fracture, right foot, initial encounter for fracture: Secondary | ICD-10-CM | POA: Diagnosis not present

## 2017-09-12 DIAGNOSIS — M79671 Pain in right foot: Secondary | ICD-10-CM | POA: Diagnosis not present

## 2017-09-13 ENCOUNTER — Other Ambulatory Visit: Payer: Self-pay

## 2017-09-13 ENCOUNTER — Ambulatory Visit (INDEPENDENT_AMBULATORY_CARE_PROVIDER_SITE_OTHER): Payer: BLUE CROSS/BLUE SHIELD | Admitting: Adult Health

## 2017-09-13 ENCOUNTER — Encounter: Payer: Self-pay | Admitting: Adult Health

## 2017-09-13 VITALS — BP 108/76 | HR 82 | Ht 60.0 in | Wt 281.0 lb

## 2017-09-13 DIAGNOSIS — N898 Other specified noninflammatory disorders of vagina: Secondary | ICD-10-CM

## 2017-09-13 NOTE — Patient Instructions (Signed)
Stress Fracture Stress fracture is a small break or crack in a bone. A stress fracture can be fully broken (complete) or partially broken (incomplete). The most common sites for stress fractures are the bones in the front of your feet (metatarsals), your heels (calcaneus), and the long bone of your lower leg (tibia). What are the causes? A stress fracture is caused by overuse or repetitive exercise, such as running. It happens when a bone cannot absorb any more shock because the muscles around it are weak. Stress fractures happen most commonly when:  You rapidly increase or start a new physical activity.  You use shoes that are worn out or do not fit you properly.  You exercise on a new surface.  What increases the risk? You may be at higher risk for this type of fracture if:  You have a condition that causes weak bones (osteoporosis).  You are female. Stress fractures are more likely to occur in women.  What are the signs or symptoms? The most common symptom of a stress fracture is feeling pain when you are using the affected part of your body. The pain usually goes away when you are resting. Other symptoms may include:  Swelling of the affected area.  Pain in the area when it is touched.  Decreased pain while resting.  Stress fracture pain usually develops over time. How is this diagnosed? Diagnosis may include:  Medical history and physical exam.  X-rays.  Bone scan.  MRI.  How is this treated? Treatment depends on the severity of your stress fracture. Treatment usually involves resting, icing, compression, and elevation (RICE) of the affected part of your body. Treatment may also include:  Medicines to reduce inflammation.  A cast or a walking shoe.  Crutches.  Surgery.  Follow these instructions at home: If you have a cast:  Do not stick anything inside the cast to scratch your skin. Doing that increases your risk of infection.  Check the skin around the  cast every day. Report any concerns to your health care provider. You may put lotion on dry skin around the edges of the cast. Do not apply lotion to the skin underneath the cast.  Keep the cast clean and dry.  Cover the cast with a watertight plastic bag to protect it from water while you take a bath or a shower. Do not let the cast get wet.  Do not put pressure on any part of the cast until it is fully hardened. This may take several hours. If You Have a Walking Shoe:   Wear it as directed by your health care provider. Managing pain, stiffness, and swelling  If directed, apply ice to the injured area: ? Put ice in a plastic bag. ? Place a towel between your skin and the bag. ? Leave the ice on for 20 minutes, 2-3 times per day.  Move your fingers or toes often to avoid stiffness and to lessen swelling.  Raise the injured area above the level of your heart while you are sitting or lying down. Activity  Rest as directed by your health care provider. Ask your health care provider if you may do alternative exercises, such as swimming or biking, while you are healing.  Return to your normal activities as directed by your health care provider. Ask your health care provider what activities are safe for you.  Perform range-of-motion exercises only as directed by your health care provider. Safety  Do not use the injured limb to support   yourbody weight until your health care provider says that you can. Use crutches if your health care provider tells you to do so. General instructions  Do not use any tobacco products, including cigarettes, chewing tobacco, or electronic cigarettes. Tobacco can delay bone healing. If you need help quitting, ask your health care provider.  Take medicines only as directed by your health care provider.  Keep all follow-up visits as directed by your health care provider. This is important. How is this prevented?  Only wear shoes that: ? Fit well. ? Are  not worn out.  Eat a healthy diet that contains vitamin D and calcium. This helps keeps your bones strong.  Be careful when you start a new physical activity. Give your body time to adjust.  Avoid doing only one kind of activity. Do different exercises, such as swimming and running, so that no single part of your body gets overused.  Do strength-training exercises. Contact a health care provider if:  Your pain gets worse.  You have new symptoms.  You have increased swelling. Get help right away if:  You lose feeling in the affected area. This information is not intended to replace advice given to you by your health care provider. Make sure you discuss any questions you have with your health care provider. Document Released: 06/30/2002 Document Revised: 12/07/2015 Document Reviewed: 11/12/2013 Elsevier Interactive Patient Education  2018 [employer]. Plantar Fasciitis Plantar fasciitis is a painful foot condition that affects the heel. It occurs when the band of tissue that connects the toes to the heel bone (plantar fascia) becomes irritated. This can happen after exercising too much or doing other repetitive activities (overuse injury). The pain from plantar fasciitis can range from mild irritation to severe pain that makes it difficult for you to walk or move. The pain is usually worse in the morning or after you have been sitting or lying down for a while. What are the causes? This condition may be caused by:  Standing for long periods of time.  Wearing shoes that do not fit.  Doing high-impact activities, including running, aerobics, and ballet.  Being overweight.  Having an abnormal way of walking (gait).  Having tight calf muscles.  Having high arches in your feet.  Starting a new athletic activity.  What are the signs or symptoms? The main symptom of this condition is heel pain. Other symptoms include:  Pain that gets worse after activity or exercise.  Pain  that is worse in the morning or after resting.  Pain that goes away after you walk for a few minutes.  How is this diagnosed? This condition may be diagnosed based on your signs and symptoms. Your health care provider will also do a physical exam to check for:  A tender area on the bottom of your foot.  A high arch in your foot.  Pain when you move your foot.  Difficulty moving your foot.  You may also need to have imaging studies to confirm the diagnosis. These can include:  X-rays.  Ultrasound.  MRI.  How is this treated? Treatment for plantar fasciitis depends on the severity of the condition. Your treatment may include:  Rest, ice, and over-the-counter pain medicines to manage your pain.  Exercises to stretch your calves and your plantar fascia.  A splint that holds your foot in a stretched, upward position while you sleep (night splint).  Physical therapy to relieve symptoms and prevent problems in the future.  Cortisone injections to relieve severe  pain.  Extracorporeal shock wave therapy (ESWT) to stimulate damaged plantar fascia with electrical impulses. It is often used as a last resort before surgery.  Surgery, if other treatments have not worked after 12 months.  Follow these instructions at home:  Take medicines only as directed by your health care provider.  Avoid activities that cause pain.  Roll the bottom of your foot over a bag of ice or a bottle of cold water. Do this for 20 minutes, 3-4 times a day.  Perform simple stretches as directed by your health care provider.  Try wearing athletic shoes with air-sole or gel-sole cushions or soft shoe inserts.  Wear a night splint while sleeping, if directed by your health care provider.  Keep all follow-up appointments with your health care provider. How is this prevented?  Do not perform exercises or activities that cause heel pain.  Consider finding low-impact activities if you continue to have  problems.  Lose weight if you need to. The best way to prevent plantar fasciitis is to avoid the activities that aggravate your plantar fascia. Contact a health care provider if:  Your symptoms do not go away after treatment with home care measures.  Your pain gets worse.  Your pain affects your ability to move or do your daily activities. This information is not intended to replace advice given to you by your health care provider. Make sure you discuss any questions you have with your health care provider. Document Released: 01/02/2001 Document Revised: 09/12/2015 Document Reviewed: 02/17/2014 Elsevier Interactive Patient Education  [employer].

## 2017-09-13 NOTE — Progress Notes (Signed)
  Subjective:     Patient ID: Adriana Moon, female   DOB: 09-08-1980, 37 y.o.   MRN: [id number]  HPI Brynleigh is a 37 year old black female in complaining of vaginal discharge and odor after last period about 2 weeks ago.  Review of Systems Vaginal discharge, white with odor after last period Denies any itching or burning Reviewed past medical,surgical, social and family history. Reviewed medications and allergies.     Objective:   Physical Exam BP 108/76 (BP Location: Left Arm, Patient Position: Sitting, Cuff Size: Large)   Pulse 82   Ht 5' (1.524 m)   Wt 281 lb (127.5 kg)   LMP 08/25/2017   BMI 54.88 kg/m   Skin warm and dry.Pelvic: external genitalia is normal in appearance no lesions, vagina: white discharge without odor,urethra has no lesions or masses noted, cervix:smooth and bulbous, uterus: normal size, shape and contour, non tender, no masses felt, adnexa: no masses or tenderness noted. Bladder is non tender and no masses felt. Has boot right foot, For stress fracture and ?tenditis left foot. Nuswab obtained     Assessment:     1. Vaginal discharge   2. Vaginal odor       Plan:     Nuswab sent F/U prn Review handouts on stress fracture and plantar fascitis

## 2017-09-16 LAB — NUSWAB VAGINITIS PLUS (VG+)
ATOPOBIUM VAGINAE: HIGH {score} — AB
BVAB 2: HIGH {score} — AB
CANDIDA ALBICANS, NAA: NEGATIVE
CHLAMYDIA TRACHOMATIS, NAA: NEGATIVE
Candida glabrata, NAA: NEGATIVE
MEGASPHAERA 1: HIGH {score} — AB
Neisseria gonorrhoeae, NAA: NEGATIVE
Trich vag by NAA: NEGATIVE

## 2017-09-18 ENCOUNTER — Telehealth: Payer: Self-pay | Admitting: Adult Health

## 2017-09-18 MED ORDER — METRONIDAZOLE 500 MG PO TABS: 500.0000 mg | ORAL_TABLET | Freq: Two times a day (BID) | ORAL | 0 refills | Status: DC

## 2017-09-18 NOTE — Telephone Encounter (Signed)
Left message that Nuswab +BV, negative for trich,yeast and GC/CHL, and rx sent for flagyl to [facility] in [place],no sex or alcohol during treatment

## 2017-09-27 DIAGNOSIS — M722 Plantar fascial fibromatosis: Secondary | ICD-10-CM | POA: Diagnosis not present

## 2017-09-27 DIAGNOSIS — M7732 Calcaneal spur, left foot: Secondary | ICD-10-CM | POA: Diagnosis not present

## 2017-09-27 DIAGNOSIS — M7731 Calcaneal spur, right foot: Secondary | ICD-10-CM | POA: Diagnosis not present

## 2017-09-27 DIAGNOSIS — G5761 Lesion of plantar nerve, right lower limb: Secondary | ICD-10-CM | POA: Diagnosis not present

## 2017-09-27 DIAGNOSIS — M67371 Transient synovitis, right ankle and foot: Secondary | ICD-10-CM | POA: Diagnosis not present

## 2017-09-27 DIAGNOSIS — M76822 Posterior tibial tendinitis, left leg: Secondary | ICD-10-CM | POA: Diagnosis not present

## 2017-09-27 DIAGNOSIS — M67372 Transient synovitis, left ankle and foot: Secondary | ICD-10-CM | POA: Diagnosis not present

## 2017-09-27 DIAGNOSIS — M19072 Primary osteoarthritis, left ankle and foot: Secondary | ICD-10-CM | POA: Diagnosis not present

## 2017-09-27 DIAGNOSIS — M76821 Posterior tibial tendinitis, right leg: Secondary | ICD-10-CM | POA: Diagnosis not present

## 2017-09-27 DIAGNOSIS — M19071 Primary osteoarthritis, right ankle and foot: Secondary | ICD-10-CM | POA: Diagnosis not present

## 2017-09-27 DIAGNOSIS — T148XXA Other injury of unspecified body region, initial encounter: Secondary | ICD-10-CM | POA: Diagnosis not present

## 2017-10-02 ENCOUNTER — Telehealth: Payer: Self-pay | Admitting: *Deleted

## 2017-10-02 MED ORDER — FLUCONAZOLE 150 MG PO TABS: ORAL_TABLET | ORAL | 1 refills | Status: DC

## 2017-10-02 NOTE — Telephone Encounter (Signed)
Pt states that she always gets a yeast infection after taking antibiotics. She states that she usually asks for medication for yeast at the time the antibiotic is prescribed but she forgot this time. She is asking if something can be sent in to her pharmacy. She states that she is having some vaginal itching. She is unable to determine if she has d/c as she is on her period. Advised that I would send her request to [facility] and she could check with her pharmacy later today. Pt verbalized understanding.

## 2017-10-02 NOTE — Telephone Encounter (Signed)
Pt finished flagyl, will rx diflcuan

## 2017-10-17 DIAGNOSIS — M67372 Transient synovitis, left ankle and foot: Secondary | ICD-10-CM | POA: Diagnosis not present

## 2017-10-17 DIAGNOSIS — M67472 Ganglion, left ankle and foot: Secondary | ICD-10-CM | POA: Diagnosis not present

## 2017-10-17 DIAGNOSIS — M67371 Transient synovitis, right ankle and foot: Secondary | ICD-10-CM | POA: Diagnosis not present

## 2017-10-17 DIAGNOSIS — M67471 Ganglion, right ankle and foot: Secondary | ICD-10-CM | POA: Diagnosis not present

## 2017-10-28 ENCOUNTER — Telehealth: Payer: Self-pay | Admitting: Pediatrics

## 2017-10-28 NOTE — Telephone Encounter (Signed)
Needs appt to be seen. 

## 2017-10-29 NOTE — Telephone Encounter (Signed)
Patient aware, appointment scheduled for 10/30/2017 at 8:15 am

## 2017-10-30 ENCOUNTER — Ambulatory Visit: Payer: BLUE CROSS/BLUE SHIELD | Admitting: Pediatrics

## 2017-10-30 ENCOUNTER — Encounter: Payer: Self-pay | Admitting: Pediatrics

## 2017-10-30 VITALS — BP 110/74 | HR 82 | Temp 98.8°F | Ht 60.0 in | Wt 288.0 lb

## 2017-10-30 DIAGNOSIS — M722 Plantar fascial fibromatosis: Secondary | ICD-10-CM | POA: Diagnosis not present

## 2017-10-30 DIAGNOSIS — Z6841 Body Mass Index (BMI) 40.0 and over, adult: Secondary | ICD-10-CM | POA: Diagnosis not present

## 2017-10-30 DIAGNOSIS — M1990 Unspecified osteoarthritis, unspecified site: Secondary | ICD-10-CM

## 2017-10-30 MED ORDER — PHENTERMINE HCL 37.5 MG PO CAPS: 37.5000 mg | ORAL_CAPSULE | ORAL | 2 refills | Status: DC

## 2017-10-30 NOTE — Progress Notes (Signed)
  Subjective:   Patient ID: Adriana Moon, female    DOB: [birth year], 37 y.o.   MRN: [id number] CC: Obesity (discuss bariatric surgery)  HPI: Adriana Moon is a 37 y.o. female   Seeing podiatry for foot pain.  Was then referred to another foot specialist.  She is wearing braces for her feet.  She is been told that she has arthritis in her feet and plantar fasciitis, total of 8 diagnoses she says.  She may need surgery.  She is very interested in losing weight.  She has been struggling with her weight and she was 37 years old.  She has tried phentermine in the past with some success, has been on it for last 3 months, minimal weight loss this time.  Has had success with weight loss while on the medicine in the past, weight comes back after it is stopped.   She is mostly drinking water.  Making healthy choices with other meals as well.  She is interested in bariatric surgery, needs approval from insurance.  Relevant past medical, surgical, family and social history reviewed. Allergies and medications reviewed and updated. Social History   Tobacco Use  Smoking Status Never Smoker  Smokeless Tobacco Never Used   ROS: Per HPI   Objective:    BP 110/74 (BP Location: Left Wrist, Patient Position: Sitting, Cuff Size: Small)   Pulse 82   Temp 98.8 F (37.1 C) (Oral)   Ht 5' (1.524 m)   Wt 288 lb (130.6 kg)   BMI 56.25 kg/m   Wt Readings from Last 3 Encounters:  10/30/17 288 lb (130.6 kg)  09/13/17 281 lb (127.5 kg)  08/22/17 290 lb 9.6 oz (131.8 kg)    Gen: NAD, alert, cooperative with exam, NCAT EYES: EOMI, no conjunctival injection, or no icterus ENT:  OP without erythema CV: NRRR, normal S1/S2, no murmur, distal pulses 2+ b/l Resp: CTABL, no wheezes, normal WOB Abd: +BS, soft, NTND.  Ext: No edema, warm Neuro: Alert and oriented MSK: Feet bilaterally in ankle braces.  Assessment & Plan:  Lynnel was seen today for obesity.  Diagnoses and all orders for this  visit:  BMI 50.0-59.9, adult ([place]) Arthritis and foot pain related to weight.  I support patient going through bariatric surgery.  Offered referral to weight loss clinic in [place], patient wants to continue phentermine and healthy lifestyle changes for now with hope of getting approved for surgery in the near future. -     phentermine 37.5 MG capsule; Take 1 capsule (37.5 mg total) by mouth every morning.  Plantar fasciitis Cont plan with specialist, may be getting surgery per pt  Arthritis   Follow up plan: 3 mo [name], MD [place]

## 2017-11-15 DIAGNOSIS — M722 Plantar fascial fibromatosis: Secondary | ICD-10-CM | POA: Diagnosis not present

## 2017-12-12 DIAGNOSIS — M7732 Calcaneal spur, left foot: Secondary | ICD-10-CM | POA: Diagnosis not present

## 2017-12-12 DIAGNOSIS — M19071 Primary osteoarthritis, right ankle and foot: Secondary | ICD-10-CM | POA: Diagnosis not present

## 2017-12-12 DIAGNOSIS — M659 Synovitis and tenosynovitis, unspecified: Secondary | ICD-10-CM | POA: Diagnosis not present

## 2017-12-12 DIAGNOSIS — M722 Plantar fascial fibromatosis: Secondary | ICD-10-CM | POA: Diagnosis not present

## 2017-12-12 DIAGNOSIS — M19072 Primary osteoarthritis, left ankle and foot: Secondary | ICD-10-CM | POA: Diagnosis not present

## 2018-01-01 ENCOUNTER — Ambulatory Visit: Payer: BLUE CROSS/BLUE SHIELD | Admitting: Adult Health

## 2018-01-01 ENCOUNTER — Encounter (INDEPENDENT_AMBULATORY_CARE_PROVIDER_SITE_OTHER): Payer: Self-pay

## 2018-01-01 ENCOUNTER — Encounter: Payer: Self-pay | Admitting: Adult Health

## 2018-01-01 ENCOUNTER — Other Ambulatory Visit: Payer: Self-pay

## 2018-01-01 VITALS — BP 113/75 | HR 89 | Ht 60.0 in | Wt 293.0 lb

## 2018-01-01 DIAGNOSIS — R300 Dysuria: Secondary | ICD-10-CM | POA: Diagnosis not present

## 2018-01-01 DIAGNOSIS — N898 Other specified noninflammatory disorders of vagina: Secondary | ICD-10-CM | POA: Diagnosis not present

## 2018-01-01 DIAGNOSIS — Z113 Encounter for screening for infections with a predominantly sexual mode of transmission: Secondary | ICD-10-CM | POA: Diagnosis not present

## 2018-01-01 DIAGNOSIS — B379 Candidiasis, unspecified: Secondary | ICD-10-CM

## 2018-01-01 LAB — POCT WET PREP (WET MOUNT): WBC, Wet Prep HPF POC: POSITIVE

## 2018-01-01 LAB — POCT URINALYSIS DIPSTICK OB
Blood, UA: NEGATIVE
GLUCOSE, UA: NEGATIVE
LEUKOCYTES UA: NEGATIVE
Nitrite, UA: NEGATIVE

## 2018-01-01 MED ORDER — FLUCONAZOLE 150 MG PO TABS: ORAL_TABLET | ORAL | 1 refills | Status: DC

## 2018-01-01 NOTE — Progress Notes (Signed)
  Subjective:     Patient ID: Adriana Moon, female   DOB: 10/07/1980, 37 y.o.   MRN: [id number]  HPI Adriana Moon is a 37 year old black female in complaining of vaginal discharge and itching, had unprotected sex last week.Took 1 diflucan with out relief yet.   Review of Systems +vaginal discharge with itching Had unprotected sex last week Burns when pees  Reviewed past medical,surgical, social and family history. Reviewed medications and allergies.     Objective:   Physical Exam BP 113/75 (BP Location: Right Arm, Patient Position: Sitting, Cuff Size: Normal)   Pulse 89   Ht 5' (1.524 m)   Wt 293 lb (132.9 kg)   LMP 12/10/2017   BMI 57.22 kg/m urine dipstick negative. Skin warm and dry.Pelvic: external genitalia is normal in appearance no lesions, vagina: white discharge without odor,urethra has no lesions or masses noted, cervix:smooth and bulbous, uterus: normal size, shape and contour, non tender, no masses felt, adnexa: no masses or tenderness noted. Bladder is non tender and no masses felt. Wet prep: + yeast and +WBCs. Nuswab obtained.     Assessment:     1. Vaginal discharge   2. Yeast infection   3. Burning with urination   4. Vaginal itching   5. Screening examination for STD (sexually transmitted disease)       Plan:    Nuswab sent Use condoms  Meds ordered this encounter  Medications  . fluconazole (DIFLUCAN) 150 MG tablet    Sig: Take 1 now and repeat 1 in 3 days if needed    Dispense:  2 tablet    Refill:  1    Order Specific Question:   Supervising Provider    Answer:   Tania Ade H [[id number]]  F/U prn

## 2018-01-04 LAB — NUSWAB VAGINITIS PLUS (VG+)
Candida albicans, NAA: POSITIVE — AB
Candida glabrata, NAA: NEGATIVE
Chlamydia trachomatis, NAA: NEGATIVE
Neisseria gonorrhoeae, NAA: NEGATIVE
TRICH VAG BY NAA: NEGATIVE

## 2018-01-07 ENCOUNTER — Telehealth: Payer: Self-pay | Admitting: Adult Health

## 2018-01-07 NOTE — Telephone Encounter (Signed)
Pt aware nuswab just +yeast, already treated with diflucan

## 2018-02-17 ENCOUNTER — Ambulatory Visit (INDEPENDENT_AMBULATORY_CARE_PROVIDER_SITE_OTHER): Payer: BLUE CROSS/BLUE SHIELD | Admitting: Adult Health

## 2018-02-17 ENCOUNTER — Encounter: Payer: Self-pay | Admitting: Adult Health

## 2018-02-17 VITALS — BP 113/58 | HR 82 | Ht 60.0 in | Wt 274.0 lb

## 2018-02-17 DIAGNOSIS — N898 Other specified noninflammatory disorders of vagina: Secondary | ICD-10-CM | POA: Diagnosis not present

## 2018-02-17 LAB — POCT WET PREP (WET MOUNT)
Clue Cells Wet Prep Whiff POC: NEGATIVE
Trichomonas Wet Prep HPF POC: ABSENT
WBC WET PREP: POSITIVE

## 2018-02-17 NOTE — Progress Notes (Signed)
  Subjective:     Patient ID: Adriana Moon, female   DOB: [birth year], 37 y.o.   MRN: [id number]  HPI Adriana Moon is a 37 year old black female in complaining of vaginal discharge with odor, has taken diflucan. She had used boric acid, but that made it worse.   Review of Systems  +vaginal discharge, took a diflucan  +vaginal odor Reviewed past medical,surgical, social and family history. Reviewed medications and allergies.     Objective:   Physical Exam BP (!) 113/58 (BP Location: Left Arm, Patient Position: Sitting, Cuff Size: Normal)   Pulse 82   Ht 5' (1.524 m)   Wt 274 lb (124.3 kg)   LMP 02/12/2018   BMI 53.51 kg/m  Skin warm and dry.Pelvic: external genitalia is normal in appearance no lesions, vagina: scant white discharge without odor,urethra has no lesions or masses noted, cervix:smooth and bulbous, uterus: normal size, shape and contour, non tender, no masses felt, adnexa: no masses or tenderness noted. Bladder is non tender and no masses felt. Wet prep:  +WBCs. Examination chaperoned by Levy Pupa LPN. Don't use soaps or body wash or sprays, just plain water.    Assessment:     1. Vaginal discharge   2. Vaginal odor       Plan:     F/U prn

## 2018-02-19 ENCOUNTER — Telehealth: Payer: Self-pay | Admitting: *Deleted

## 2018-02-19 MED ORDER — METRONIDAZOLE 0.75 % VA GEL: 1.0000 | Freq: Two times a day (BID) | VAGINAL | 1 refills | Status: DC

## 2018-02-19 NOTE — Telephone Encounter (Signed)
Will rx metrogel  

## 2018-03-03 ENCOUNTER — Telehealth: Payer: Self-pay | Admitting: Adult Health

## 2018-03-03 NOTE — Telephone Encounter (Signed)
Patient called staying that Adriana Moon has prescribed her metro gel for bacteria and now she has a yeast infection. Pt would like to know if Adriana Moon could call her in diflucan. Spoke with patient and let her know that Adriana Moon is not in the office this week and wont be back until next week. Pt would like another provider to call her in something. Pt uses the [facility] in [place]. Please contact ptr

## 2018-03-03 NOTE — Telephone Encounter (Signed)
Spoke with pt. Pt has a yeast infection. Has been on Metrogel recently. Pt is requesting Diflucan with a refill. Please advise. Thanks!! [name]

## 2018-03-04 ENCOUNTER — Telehealth: Payer: Self-pay | Admitting: Obstetrics & Gynecology

## 2018-03-04 MED ORDER — FLUCONAZOLE 150 MG PO TABS: 150.0000 mg | ORAL_TABLET | Freq: Once | ORAL | 1 refills | Status: AC

## 2018-03-04 NOTE — Telephone Encounter (Signed)
done

## 2018-03-04 NOTE — Telephone Encounter (Signed)
Meds ordered this encounter  Medications  . fluconazole (DIFLUCAN) 150 MG tablet    Sig: Take 1 tablet (150 mg total) by mouth once for 1 dose. Take the second tablet 3 days after the first one.    Dispense:  2 tablet    Refill:  1    

## 2018-05-09 ENCOUNTER — Encounter: Payer: Self-pay | Admitting: Adult Health

## 2018-05-09 ENCOUNTER — Ambulatory Visit: Payer: BLUE CROSS/BLUE SHIELD | Admitting: Adult Health

## 2018-05-09 VITALS — BP 115/85 | HR 67 | Ht 60.0 in | Wt 256.4 lb

## 2018-05-09 DIAGNOSIS — N898 Other specified noninflammatory disorders of vagina: Secondary | ICD-10-CM | POA: Insufficient documentation

## 2018-05-09 DIAGNOSIS — B379 Candidiasis, unspecified: Secondary | ICD-10-CM | POA: Insufficient documentation

## 2018-05-09 LAB — POCT WET PREP (WET MOUNT)
Clue Cells Wet Prep Whiff POC: NEGATIVE
Trichomonas Wet Prep HPF POC: ABSENT

## 2018-05-09 MED ORDER — FLUCONAZOLE 150 MG PO TABS: ORAL_TABLET | ORAL | 2 refills | Status: DC

## 2018-05-09 NOTE — Progress Notes (Signed)
Patient ID: Adriana Moon, female   DOB: May 20, 1980, 38 y.o.   MRN: [id number] History of Present Illness: Adriana Moon is a 38 year old black female, in complaining of itching and had odor, but douched. PCP is Dr Evette Doffing.   Current Medications, Allergies, Past Medical History, Past Surgical History, Family History and Social History were reviewed in [employer] record.     Review of Systems: +vaginal itching +vaginal odor, did douche No pain with sex    Physical Exam:BP 115/85 (BP Location: Left Arm, Patient Position: Sitting, Cuff Size: Normal)   Pulse 67   Ht 5' (1.524 m)   Wt 256 lb 6.4 oz (116.3 kg)   LMP 04/23/2018 (Exact Date)   BMI 50.07 kg/m  General:  Well developed, well nourished, no acute distress Skin:  Warm and dry Pelvic:  External genitalia is normal in appearance, no lesions.  The vagina is normal in appearance.Scant white discharge, no odor. Urethra has no lesions or masses. The cervix is bulbous.  Uterus is felt to be normal size, shape, and contour.  No adnexal masses or tenderness noted.Bladder is non tender, no masses felt.Nuswab obtained.Wet prep: few WBCs and few yeast buds.  Psych:  No mood changes, alert and cooperative,seems happy PHQ 2 score 0. Fall risk is low. Examination chaperoned by Estill Bamberg Rash LPN.  Impression:  1. Yeast infection   2. Vaginal itching   3. Vaginal discharge      Plan: Nuswab sent Meds ordered this encounter  Medications  . fluconazole (DIFLUCAN) 150 MG tablet    Sig: Take 1 now and repeat 1 in 3 days then 1 every month before period    Dispense:  6 tablet    Refill:  2    Order Specific Question:   Supervising Provider    Answer:   Tania Ade H [[id number]]  Do not douche  F/U prn

## 2018-05-12 ENCOUNTER — Telehealth: Payer: Self-pay | Admitting: Adult Health

## 2018-05-12 NOTE — Telephone Encounter (Signed)
Please call pt with results from Friday test

## 2018-05-12 NOTE — Telephone Encounter (Signed)
Pt requests lab results. DOB verified. Informed that gc/chl, and trich were negative. Advised that the remaining results are not back yet. Advised that if they were positive we would call her. Pt verbalized understanding.

## 2018-05-13 LAB — NUSWAB VAGINITIS PLUS (VG+)
Atopobium vaginae: HIGH Score — AB
BVAB 2: HIGH Score — AB
Candida albicans, NAA: POSITIVE — AB
Candida glabrata, NAA: NEGATIVE
Chlamydia trachomatis, NAA: NEGATIVE
Megasphaera 1: HIGH Score — AB
Neisseria gonorrhoeae, NAA: NEGATIVE
Trich vag by NAA: NEGATIVE

## 2018-05-14 ENCOUNTER — Telehealth: Payer: Self-pay | Admitting: Adult Health

## 2018-05-14 ENCOUNTER — Other Ambulatory Visit: Payer: Self-pay | Admitting: Adult Health

## 2018-05-14 MED ORDER — METRONIDAZOLE 0.75 % VA GEL: 1.0000 | Freq: Every day | VAGINAL | 1 refills | Status: DC

## 2018-05-14 MED ORDER — METRONIDAZOLE 500 MG PO TABS: 500.0000 mg | ORAL_TABLET | Freq: Two times a day (BID) | ORAL | 0 refills | Status: DC

## 2018-05-14 NOTE — Progress Notes (Signed)
Pt requests flagyl

## 2018-05-14 NOTE — Telephone Encounter (Signed)
Left message that Nuswab +Yeast and BV, so finish diflucan and I refilled metrogel, so use one applicator in vagina at HS for 5 nights

## 2018-05-15 DIAGNOSIS — Z6841 Body Mass Index (BMI) 40.0 and over, adult: Secondary | ICD-10-CM | POA: Diagnosis not present

## 2018-05-15 DIAGNOSIS — R21 Rash and other nonspecific skin eruption: Secondary | ICD-10-CM | POA: Diagnosis not present

## 2018-05-15 DIAGNOSIS — T783XXA Angioneurotic edema, initial encounter: Secondary | ICD-10-CM | POA: Diagnosis not present

## 2018-05-15 DIAGNOSIS — T7840XA Allergy, unspecified, initial encounter: Secondary | ICD-10-CM | POA: Diagnosis not present

## 2018-06-12 ENCOUNTER — Other Ambulatory Visit: Payer: Self-pay | Admitting: Pediatrics

## 2018-06-12 DIAGNOSIS — K219 Gastro-esophageal reflux disease without esophagitis: Secondary | ICD-10-CM

## 2018-06-13 NOTE — Telephone Encounter (Signed)
Last seen 10/30/17  Dr Evette Doffing

## 2018-06-18 ENCOUNTER — Other Ambulatory Visit: Payer: Self-pay | Admitting: Pediatrics

## 2018-06-18 DIAGNOSIS — J4520 Mild intermittent asthma, uncomplicated: Secondary | ICD-10-CM

## 2018-06-19 NOTE — Telephone Encounter (Signed)
Last seen 10/30/17

## 2018-06-22 HISTORY — PX: ECTOPIC PREGNANCY SURGERY: SHX613

## 2018-07-07 DIAGNOSIS — N939 Abnormal uterine and vaginal bleeding, unspecified: Secondary | ICD-10-CM | POA: Diagnosis not present

## 2018-07-07 DIAGNOSIS — O209 Hemorrhage in early pregnancy, unspecified: Secondary | ICD-10-CM | POA: Diagnosis not present

## 2018-07-07 DIAGNOSIS — B9689 Other specified bacterial agents as the cause of diseases classified elsewhere: Secondary | ICD-10-CM | POA: Diagnosis not present

## 2018-07-07 DIAGNOSIS — K219 Gastro-esophageal reflux disease without esophagitis: Secondary | ICD-10-CM | POA: Diagnosis not present

## 2018-07-07 DIAGNOSIS — O23591 Infection of other part of genital tract in pregnancy, first trimester: Secondary | ICD-10-CM | POA: Diagnosis not present

## 2018-07-07 DIAGNOSIS — Z3A00 Weeks of gestation of pregnancy not specified: Secondary | ICD-10-CM | POA: Diagnosis not present

## 2018-07-07 DIAGNOSIS — Z79899 Other long term (current) drug therapy: Secondary | ICD-10-CM | POA: Diagnosis not present

## 2018-07-07 DIAGNOSIS — O26891 Other specified pregnancy related conditions, first trimester: Secondary | ICD-10-CM | POA: Diagnosis not present

## 2018-07-08 ENCOUNTER — Telehealth: Payer: Self-pay | Admitting: Adult Health

## 2018-07-08 NOTE — Telephone Encounter (Signed)
Patient called stating that she would like a call back from the nurse, pt states that she went to the ER and would like to go over what they said. Please contact pt

## 2018-07-08 NOTE — Telephone Encounter (Signed)
Pt went ER last night and hcg level was 1696. She was advised by [facility] ED to repeat it with [name] in 48 hours. Advised patient to keep appt tomorrow.

## 2018-07-09 ENCOUNTER — Other Ambulatory Visit: Payer: Self-pay

## 2018-07-09 ENCOUNTER — Encounter: Payer: Self-pay | Admitting: Adult Health

## 2018-07-09 ENCOUNTER — Ambulatory Visit: Payer: BLUE CROSS/BLUE SHIELD | Admitting: Adult Health

## 2018-07-09 VITALS — BP 121/87 | HR 68 | Ht 60.0 in | Wt 261.0 lb

## 2018-07-09 DIAGNOSIS — Z853 Personal history of malignant neoplasm of breast: Secondary | ICD-10-CM

## 2018-07-09 DIAGNOSIS — N939 Abnormal uterine and vaginal bleeding, unspecified: Secondary | ICD-10-CM | POA: Insufficient documentation

## 2018-07-09 DIAGNOSIS — Z3490 Encounter for supervision of normal pregnancy, unspecified, unspecified trimester: Secondary | ICD-10-CM | POA: Diagnosis not present

## 2018-07-09 NOTE — Progress Notes (Signed)
Patient ID: Adriana Moon, female   DOB: 10/30/1980, 38 y.o.   MRN: [id number] History of Present Illness:  Adriana Moon is a 38 year old black female, in for follow up on going to ER at [facility] on 07/07/2018, she had vaginal bleeding and pain/cramping in right side.. still bleeding brown, but no pain.Her QHCG was 1696 in ER, and she had [name] but did not see anything she says. And she was treated for BV, has GC/CHL pending, per papers given to her. She has history of breast cancer. PCP is [facility]  Current Medications, Allergies, Past Medical History, Past Surgical History, Family History and Social History were reviewed in [employer] record.     Review of Systems: +bleding brown No pain    Physical Exam:BP 121/87 (BP Location: Left Arm, Patient Position: Sitting, Cuff Size: Large)   Pulse 68   Ht 5' (1.524 m)   Wt 261 lb (118.4 kg)   LMP 06/22/2018   BMI 50.97 kg/m  General:  Well developed, well nourished, no acute distress Skin:  Warm and dry Lungs; Clear to auscultation bilaterally Cardiovascular: Regular rate and rhythm Pelvic: Deferred Psych:  No mood changes, alert and cooperative,seems happy   Impression: 1. Pregnancy, unspecified gestational age   50. Vaginal bleeding   3. History of breast cancer       Plan: Check QHCG  Follow up in 2 days Will get blood type from [facility] records

## 2018-07-10 ENCOUNTER — Telehealth: Payer: Self-pay | Admitting: Adult Health

## 2018-07-10 LAB — BETA HCG QUANT (REF LAB): hCG Quant: 2005 m[IU]/mL

## 2018-07-10 NOTE — Telephone Encounter (Signed)
Pt aware that [facility] is rising, has appt tomorrow, will recheck [facility] then and get F/U US scheduled for next week, she has no pain, some spotting, and blood type is A+from [facility]. She is sp ablation and is aware that not sure if going to be IUP or ectopic at this time, if has severe pain go to ER.

## 2018-07-11 ENCOUNTER — Ambulatory Visit: Payer: BLUE CROSS/BLUE SHIELD | Admitting: Adult Health

## 2018-07-11 ENCOUNTER — Encounter: Payer: Self-pay | Admitting: Adult Health

## 2018-07-11 ENCOUNTER — Other Ambulatory Visit: Payer: Self-pay

## 2018-07-11 VITALS — BP 131/86 | HR 69 | Ht 60.0 in | Wt 258.0 lb

## 2018-07-11 DIAGNOSIS — N939 Abnormal uterine and vaginal bleeding, unspecified: Secondary | ICD-10-CM

## 2018-07-11 DIAGNOSIS — Z3490 Encounter for supervision of normal pregnancy, unspecified, unspecified trimester: Secondary | ICD-10-CM | POA: Diagnosis not present

## 2018-07-11 DIAGNOSIS — O3680X0 Pregnancy with inconclusive fetal viability, not applicable or unspecified: Secondary | ICD-10-CM | POA: Diagnosis not present

## 2018-07-11 NOTE — Progress Notes (Signed)
Patient ID: Adriana Moon, female   DOB: 07-Jun-1980, 38 y.o.   MRN: [id number] History of Present Illness: Adriana Moon is a 38 year old black female, sp breast cancer, and sp ablation having had +QHCG and numbers rising, back in follow up.No pain spotting brown.    Current Medications, Allergies, Past Medical History, Past Surgical History, Family History and Social History were reviewed in [employer] record.     Review of Systems: Spotting brown No pain    Physical Exam:BP 131/86 (BP Location: Left Arm, Patient Position: Sitting, Cuff Size: Large)   Pulse 69   Ht 5' (1.524 m)   Wt 258 lb (117 kg)   LMP 06/22/2018   BMI 50.39 kg/m  General:  Well developed, well nourished, no acute distress Skin:  Warm and dry Psych:  No mood changes, alert and cooperative,seems happy Will check labs and get in on 3/26 for [name] which is 10 days from last [name].   Impression: 1. Pregnancy, unspecified gestational age   38. Vaginal bleeding   3. Encounter to determine fetal viability of pregnancy, single or unspecified fetus       Plan: Check progesterone and QHCG  Return in 6 days for [name] and see me If any increased pain go to ER

## 2018-07-12 DIAGNOSIS — Z833 Family history of diabetes mellitus: Secondary | ICD-10-CM | POA: Diagnosis not present

## 2018-07-12 DIAGNOSIS — O0991 Supervision of high risk pregnancy, unspecified, first trimester: Secondary | ICD-10-CM | POA: Diagnosis not present

## 2018-07-12 DIAGNOSIS — Z888 Allergy status to other drugs, medicaments and biological substances status: Secondary | ICD-10-CM | POA: Diagnosis not present

## 2018-07-12 DIAGNOSIS — O009 Unspecified ectopic pregnancy without intrauterine pregnancy: Secondary | ICD-10-CM | POA: Diagnosis not present

## 2018-07-12 DIAGNOSIS — R109 Unspecified abdominal pain: Secondary | ICD-10-CM | POA: Diagnosis not present

## 2018-07-12 DIAGNOSIS — O00101 Right tubal pregnancy without intrauterine pregnancy: Secondary | ICD-10-CM | POA: Diagnosis not present

## 2018-07-12 DIAGNOSIS — Z853 Personal history of malignant neoplasm of breast: Secondary | ICD-10-CM | POA: Diagnosis not present

## 2018-07-12 DIAGNOSIS — Z8249 Family history of ischemic heart disease and other diseases of the circulatory system: Secondary | ICD-10-CM | POA: Diagnosis not present

## 2018-07-12 DIAGNOSIS — K219 Gastro-esophageal reflux disease without esophagitis: Secondary | ICD-10-CM | POA: Diagnosis not present

## 2018-07-12 DIAGNOSIS — O081 Delayed or excessive hemorrhage following ectopic and molar pregnancy: Secondary | ICD-10-CM | POA: Diagnosis not present

## 2018-07-12 LAB — PROGESTERONE: Progesterone: 2.9 ng/mL

## 2018-07-12 LAB — BETA HCG QUANT (REF LAB): hCG Quant: 2527 m[IU]/mL

## 2018-07-14 ENCOUNTER — Telehealth: Payer: Self-pay | Admitting: Adult Health

## 2018-07-14 NOTE — Telephone Encounter (Signed)
Patient called stating that she had to have emergency surgery do to ectopic pregnancy. Pt would like to speak with Anderson Malta. Please contact pt

## 2018-07-14 NOTE — Telephone Encounter (Signed)
Had pain Saturday morning, and went to [facility]  and had left ectopic surgery and left tube removal by Dr Adah Perl.Wil lcancel appt Thursday

## 2018-07-17 ENCOUNTER — Encounter: Payer: BLUE CROSS/BLUE SHIELD | Admitting: Adult Health

## 2018-07-17 ENCOUNTER — Other Ambulatory Visit: Payer: BLUE CROSS/BLUE SHIELD

## 2018-07-25 DIAGNOSIS — Z09 Encounter for follow-up examination after completed treatment for conditions other than malignant neoplasm: Secondary | ICD-10-CM | POA: Diagnosis not present

## 2018-09-09 ENCOUNTER — Other Ambulatory Visit: Payer: Self-pay

## 2018-09-09 ENCOUNTER — Ambulatory Visit (INDEPENDENT_AMBULATORY_CARE_PROVIDER_SITE_OTHER): Payer: BLUE CROSS/BLUE SHIELD | Admitting: Family Medicine

## 2018-09-09 ENCOUNTER — Encounter: Payer: Self-pay | Admitting: Family Medicine

## 2018-09-09 DIAGNOSIS — N3090 Cystitis, unspecified without hematuria: Secondary | ICD-10-CM | POA: Diagnosis not present

## 2018-09-09 MED ORDER — NITROFURANTOIN MONOHYD MACRO 100 MG PO CAPS: 100.0000 mg | ORAL_CAPSULE | Freq: Two times a day (BID) | ORAL | 0 refills | Status: AC

## 2018-09-09 MED ORDER — PHENAZOPYRIDINE HCL 100 MG PO TABS: 100.0000 mg | ORAL_TABLET | Freq: Three times a day (TID) | ORAL | 0 refills | Status: AC | PRN

## 2018-09-09 NOTE — Progress Notes (Signed)
Virtual Visit via telephone Note Due to COVID-19, visit is conducted virtually and was requested by patient. This visit type was conducted due to national recommendations for restrictions regarding the COVID-19 Pandemic (e.g. social distancing) in an effort to limit this patient's exposure and mitigate transmission in our community. All issues noted in this document were discussed and addressed.  A physical exam was not performed with this format.   I connected with Adriana Moon on 09/09/18 at 1325 by telephone and verified that I am speaking with the correct person using two identifiers. Adriana Moon is currently located at home and family is currently with them during visit. The provider, Monia Pouch, FNP is located in their office at time of visit.  I discussed the limitations, risks, security and privacy concerns of performing an evaluation and management service by telephone and the availability of in person appointments. I also discussed with the patient that there may be a patient responsible charge related to this service. The patient expressed understanding and agreed to proceed.  Subjective:  Patient ID: Adriana Moon, female    DOB: 05-Apr-1981, 38 y.o.   MRN: [id number]  Chief Complaint:  Dysuria   HPI: Adriana Moon is a 38 y.o. female presenting on 09/09/2018 for Dysuria   Pt reports dysuria that started 2 days ago. States she has urinary frequency, burning at the end of voiding, and urgency. She denies fever, chills, back pain, abdominal pain, or vaginal symptoms.   Dysuria   This is a new problem. The current episode started in the past 7 days. The problem occurs every urination. The problem has been gradually worsening. The quality of the pain is described as burning and aching. The pain is at a severity of 4/10. The pain is mild. There has been no fever. Associated symptoms include frequency and urgency. Pertinent negatives include no chills,  discharge, flank pain, hematuria, hesitancy, nausea, possible pregnancy, sweats or vomiting. She has tried nothing for the symptoms.     Relevant past medical, surgical, family, and social history reviewed and updated as indicated.  Allergies and medications reviewed and updated.   Past Medical History:  Diagnosis Date  . Angio-edema   . Arthritis of both knees   . Asthma   . Breast cancer ([place])   . Breast disorder    cancer  . Migraines   . Urticaria     Past Surgical History:  Procedure Laterality Date  . BREAST LUMPECTOMY Right 2009  . BREAST REDUCTION SURGERY Bilateral 01/25/2015   Procedure: MAMMARY REDUCTION  (BREAST);  Surgeon: [name], MD;  Location: [facility];  Service: Plastics;  Laterality: Bilateral;  . CESAREAN SECTION    . ENDOMETRIAL ABLATION    . REDUCTION MAMMAPLASTY Left   . RT lumpectomy      Social History   Socioeconomic History  . Marital status: Legally Separated    Spouse name: Not on file  . Number of children: 2  . Years of education: Not on file  . Highest education level: Not on file  Occupational History  . Not on file  Social Needs  . Financial resource strain: Not on file  . Food insecurity:    Worry: Not on file    Inability: Not on file  . Transportation needs:    Medical: Not on file    Non-medical: Not on file  Tobacco Use  . Smoking status: Never Smoker  . Smokeless tobacco: Never Used  Substance  and Sexual Activity  . Alcohol use: Yes    Alcohol/week: 0.0 standard drinks    Comment: occ.   . Drug use: No  . Sexual activity: Yes    Birth control/protection: None, Surgical  Lifestyle  . Physical activity:    Days per week: Not on file    Minutes per session: Not on file  . Stress: Not on file  Relationships  . Social connections:    Talks on phone: Not on file    Gets together: Not on file    Attends religious service: Not on file    Active member of club or organization: Not on file     Attends meetings of clubs or organizations: Not on file    Relationship status: Not on file  . Intimate partner violence:    Fear of current or ex partner: Not on file    Emotionally abused: Not on file    Physically abused: Not on file    Forced sexual activity: Not on file  Other Topics Concern  . Not on file  Social History Narrative  . Not on file    Outpatient Encounter Medications as of 09/09/2018  Medication Sig  . albuterol (PROVENTIL HFA;VENTOLIN HFA) 108 (90 Base) MCG/ACT inhaler Inhale 2 puffs into the lungs every 6 (six) hours as needed for wheezing or shortness of breath.  . cetirizine (ZYRTEC) 10 MG tablet Take 1-2 tablets 1-2 times a day as needed  . cyclobenzaprine (FLEXERIL) 10 MG tablet Take 1 tablet (10 mg total) by mouth at bedtime as needed for muscle spasms.  . fluconazole (DIFLUCAN) 150 MG tablet Take 1 now and repeat 1 in 3 days then 1 every month before period  . fluticasone (FLONASE) 50 MCG/ACT nasal spray Place 2 sprays into both nostrils daily.  . fluticasone furoate-vilanterol (BREO ELLIPTA) 100-25 MCG/INH AEPB Inhale 1 puff into the lungs daily.  . montelukast (SINGULAIR) 10 MG tablet TAKE 1 TABLET BY MOUTH AT BEDTIME  . nitrofurantoin, macrocrystal-monohydrate, (MACROBID) 100 MG capsule Take 1 capsule (100 mg total) by mouth 2 (two) times daily for 5 days. 1 po BId  . omeprazole (PRILOSEC) 40 MG capsule TAKE 1 CAPSULE BY MOUTH ONCE DAILY  . phenazopyridine (PYRIDIUM) 100 MG tablet Take 1 tablet (100 mg total) by mouth 3 (three) times daily as needed for up to 3 days for pain.  Marland Kitchen Spacer/Aero Chamber Mouthpiece MISC 1 each by Does not apply route every 6 (six) hours as needed.   No facility-administered encounter medications on file as of 09/09/2018.     Allergies  Allergen Reactions  . Prednisone Swelling    Lost taste in her mouth  . Tolmetin Hives and Swelling  . Other Cough  . Nsaids Hives and Swelling  . Shellfish Allergy Hives and Swelling     Review of Systems  Constitutional: Negative for chills, fatigue and fever.  Gastrointestinal: Negative for abdominal pain, nausea and vomiting.  Genitourinary: Positive for dysuria, frequency and urgency. Negative for decreased urine volume, difficulty urinating, dyspareunia, enuresis, flank pain, genital sores, hematuria, hesitancy, menstrual problem, pelvic pain, vaginal bleeding, vaginal discharge and vaginal pain.  Musculoskeletal: Negative for back pain and myalgias.  Neurological: Negative for weakness and headaches.  Psychiatric/Behavioral: Negative for confusion.  All other systems reviewed and are negative.        Observations/Objective: No vital signs or physical exam, this was a telephone or virtual health encounter.  Pt alert and oriented, answers all questions appropriately, and able to  speak in full sentences.    Assessment and Plan: [place] was seen today for dysuria.  Diagnoses and all orders for this visit:  Cystitis Reported symptoms consistent with cystitis. Pt denies pregnancy. Symptomatic care discussed. Increase water intake. Avoid bladder irritants. Will empirically treat with nitrofurantoin. Medications as prescribed. Report any new or worsening symptoms.  -     phenazopyridine (PYRIDIUM) 100 MG tablet; Take 1 tablet (100 mg total) by mouth 3 (three) times daily as needed for up to 3 days for pain. -     nitrofurantoin, macrocrystal-monohydrate, (MACROBID) 100 MG capsule; Take 1 capsule (100 mg total) by mouth 2 (two) times daily for 5 days. 1 po BId     Follow Up Instructions: Return if symptoms worsen or fail to improve.    I discussed the assessment and treatment plan with the patient. The patient was provided an opportunity to ask questions and all were answered. The patient agreed with the plan and demonstrated an understanding of the instructions.   The patient was advised to call back or seek an in-person evaluation if the symptoms worsen or if  the condition fails to improve as anticipated.  The above assessment and management plan was discussed with the patient. The patient verbalized understanding of and has agreed to the management plan. Patient is aware to call the clinic if symptoms persist or worsen. Patient is aware when to return to the clinic for a follow-up visit. Patient educated on when it is appropriate to go to the emergency department.    I provided 15 minutes of non-face-to-face time during this encounter. The call started at 1325. The call ended at 1340. The other time was used for coordination of care.    Monia Pouch, FNP-C [facility] [address] [place], [place] [address] [phone]

## 2018-10-22 ENCOUNTER — Encounter: Payer: Self-pay | Admitting: Adult Health

## 2018-10-22 ENCOUNTER — Other Ambulatory Visit: Payer: Self-pay | Admitting: Obstetrics and Gynecology

## 2018-10-22 ENCOUNTER — Telehealth: Payer: Self-pay | Admitting: Adult Health

## 2018-10-22 ENCOUNTER — Other Ambulatory Visit: Payer: Self-pay

## 2018-10-22 ENCOUNTER — Ambulatory Visit (INDEPENDENT_AMBULATORY_CARE_PROVIDER_SITE_OTHER): Payer: Self-pay | Admitting: Adult Health

## 2018-10-22 VITALS — BP 115/79 | HR 72 | Ht 60.0 in | Wt 236.8 lb

## 2018-10-22 DIAGNOSIS — Z853 Personal history of malignant neoplasm of breast: Secondary | ICD-10-CM

## 2018-10-22 DIAGNOSIS — N6312 Unspecified lump in the right breast, upper inner quadrant: Secondary | ICD-10-CM | POA: Insufficient documentation

## 2018-10-22 DIAGNOSIS — Z1231 Encounter for screening mammogram for malignant neoplasm of breast: Secondary | ICD-10-CM

## 2018-10-22 NOTE — Telephone Encounter (Signed)
Patient called, she is requesting an order for a diagnostic mammogram to be sent to the [facility].  Patient stated that she has felt a knot in her right breast.  Patient stated that she has had cancer before.  [phone]

## 2018-10-22 NOTE — Progress Notes (Signed)
Patient ID: Adriana Moon, female   DOB: December 11, 1980, 38 y.o.   MRN: [id number] History of Present Illness: Adriana Moon is a 38 year old black female, G3P0012 in complaining of right breast mass.    Current Medications, Allergies, Past Medical History, Past Surgical History, Family History and Social History were reviewed in [employer] record.     Review of Systems: Right breast mass     Physical Exam:BP 115/79 (BP Location: Right Arm, Patient Position: Sitting, Cuff Size: Normal)   Pulse 72   Ht 5' (1.524 m)   Wt 236 lb 12.8 oz (107.4 kg)   LMP 10/09/2018   BMI 46.25 kg/m  General:  Well developed, well nourished, no acute distress Skin:  Warm and dry Breast:  No dominant palpable mass, retraction, or nipple discharge on the left, on th right has 1 cm tender mass at 11-12 o'clock, and has irregularities through out, she is spt lumpectomy on right got breast cancer and has had bilateral breast reduction. Psych:  No mood changes, alert and cooperative,seems happy Fall risk is low She has lost 60 lbs in last year, has been trying.   Impression: 1. Mass of upper inner quadrant of right breast   2. History of breast cancer       Plan: Will scheduled diagnostic mammogram at the [facility] in [place], for 7/8 at 11 am  Orders Placed This Encounter  Procedures  . US BREAST LTD UNI RIGHT INC AXILLA  . MM DIAG BREAST TOMO BILATERAL  Follow up prn

## 2018-10-22 NOTE — Telephone Encounter (Signed)
Spoke with pt letting her know she will need an appt here first before a diagnostic mammogram can be ordered. Attempted to transfer call to front desk but advised if we got disconnected, call office back to schedule. Pt voiced understanding. [place]

## 2018-10-29 ENCOUNTER — Other Ambulatory Visit: Payer: Self-pay

## 2018-10-29 ENCOUNTER — Other Ambulatory Visit: Payer: Self-pay | Admitting: Adult Health

## 2018-10-29 ENCOUNTER — Ambulatory Visit (INDEPENDENT_AMBULATORY_CARE_PROVIDER_SITE_OTHER): Payer: Self-pay | Admitting: Physician Assistant

## 2018-10-29 ENCOUNTER — Ambulatory Visit
Admission: RE | Admit: 2018-10-29 | Discharge: 2018-10-29 | Disposition: A | Discharge status: Home / Self Care | Payer: BC Managed Care – PPO | Source: Ambulatory Visit | Attending: Adult Health | Admitting: Adult Health

## 2018-10-29 DIAGNOSIS — N6312 Unspecified lump in the right breast, upper inner quadrant: Secondary | ICD-10-CM

## 2018-10-29 DIAGNOSIS — Z853 Personal history of malignant neoplasm of breast: Secondary | ICD-10-CM

## 2018-10-29 DIAGNOSIS — N6311 Unspecified lump in the right breast, upper outer quadrant: Secondary | ICD-10-CM | POA: Diagnosis not present

## 2018-10-29 DIAGNOSIS — N6310 Unspecified lump in the right breast, unspecified quadrant: Secondary | ICD-10-CM

## 2018-10-29 DIAGNOSIS — R928 Other abnormal and inconclusive findings on diagnostic imaging of breast: Secondary | ICD-10-CM | POA: Diagnosis not present

## 2018-10-29 DIAGNOSIS — N3090 Cystitis, unspecified without hematuria: Secondary | ICD-10-CM

## 2018-10-29 MED ORDER — NITROFURANTOIN MONOHYD MACRO 100 MG PO CAPS: 100.0000 mg | ORAL_CAPSULE | Freq: Two times a day (BID) | ORAL | 0 refills | Status: DC

## 2018-10-29 MED ORDER — PHENAZOPYRIDINE HCL 100 MG PO TABS: 100.0000 mg | ORAL_TABLET | Freq: Three times a day (TID) | ORAL | 0 refills | Status: DC | PRN

## 2018-10-30 ENCOUNTER — Telehealth: Payer: Self-pay | Admitting: *Deleted

## 2018-10-30 MED ORDER — METRONIDAZOLE 500 MG PO TABS: 500.0000 mg | ORAL_TABLET | Freq: Two times a day (BID) | ORAL | 0 refills | Status: DC

## 2018-10-30 NOTE — Telephone Encounter (Signed)
Patient has questions regarding a antibiotic she is taking. Please advise

## 2018-10-30 NOTE — Telephone Encounter (Addendum)
Pt has a UTI and BV. Pt was prescribed Macrobid for UTI from PCP and pt is requesting something for BV. Please advise. Thanks!! [place]

## 2018-10-30 NOTE — Telephone Encounter (Signed)
Pt aware that Rx sent for flagyl

## 2018-10-30 NOTE — Addendum Note (Signed)
Addended by: Derrek Monaco A on: 10/30/2018 05:04 PM   Modules accepted: Orders

## 2018-11-03 ENCOUNTER — Ambulatory Visit
Admission: RE | Admit: 2018-11-03 | Discharge: 2018-11-03 | Disposition: A | Discharge status: Home / Self Care | Payer: BC Managed Care – PPO | Source: Ambulatory Visit | Attending: Adult Health | Admitting: Adult Health

## 2018-11-03 ENCOUNTER — Other Ambulatory Visit: Payer: Self-pay | Admitting: Adult Health

## 2018-11-03 ENCOUNTER — Encounter: Payer: Self-pay | Admitting: Physician Assistant

## 2018-11-03 DIAGNOSIS — N6310 Unspecified lump in the right breast, unspecified quadrant: Secondary | ICD-10-CM

## 2018-11-03 DIAGNOSIS — N6311 Unspecified lump in the right breast, upper outer quadrant: Secondary | ICD-10-CM | POA: Diagnosis not present

## 2018-11-03 DIAGNOSIS — N6312 Unspecified lump in the right breast, upper inner quadrant: Secondary | ICD-10-CM | POA: Diagnosis not present

## 2018-11-03 DIAGNOSIS — N641 Fat necrosis of breast: Secondary | ICD-10-CM | POA: Diagnosis not present

## 2018-11-03 DIAGNOSIS — C50411 Malignant neoplasm of upper-outer quadrant of right female breast: Secondary | ICD-10-CM | POA: Diagnosis not present

## 2018-11-03 NOTE — Progress Notes (Signed)
Telephone visit  Subjective: CC: UTI  PCP: Baruch Gouty, FNP MCN:[id number] Adriana Moon is a 38 y.o. female calls for telephone consult today. Patient provides verbal consent for consult held via phone.  Patient is identified with 2 separate identifiers.  At this time the entire area is on COVID-19 social distancing and stay home orders are in place.  Patient is of higher risk and therefore we are performing this by a virtual method.  Location of patient: Home Location of provider: [facility] Others present for call: No   This patient has had several days of dysuria, frequency and nocturia. There is also pain over the bladder in the suprapubic region, no back pain. Denies leakage or hematuria.  Denies fever or chills. No pain in flank area.    ROS: Per HPI  Allergies  Allergen Reactions  . Prednisone Swelling    Lost taste in her mouth  . Tolmetin Hives and Swelling  . Other Cough  . Nsaids Hives and Swelling  . Shellfish Allergy Hives and Swelling   Past Medical History:  Diagnosis Date  . Angio-edema   . Arthritis of both knees   . Asthma   . Breast cancer ([place])   . Breast disorder    cancer  . Migraines   . Urticaria     Current Outpatient Medications:  .  albuterol (PROVENTIL HFA;VENTOLIN HFA) 108 (90 Base) MCG/ACT inhaler, Inhale 2 puffs into the lungs every 6 (six) hours as needed for wheezing or shortness of breath., Disp: 1 Inhaler, Rfl: 2 .  cetirizine (ZYRTEC) 10 MG tablet, Take 1-2 tablets 1-2 times a day as needed, Disp: 180 tablet, Rfl: 3 .  cyclobenzaprine (FLEXERIL) 10 MG tablet, Take 1 tablet (10 mg total) by mouth at bedtime as needed for muscle spasms., Disp: 30 tablet, Rfl: 2 .  fluconazole (DIFLUCAN) 150 MG tablet, Take 1 now and repeat 1 in 3 days then 1 every month before period, Disp: 6 tablet, Rfl: 2 .  fluticasone (FLONASE) 50 MCG/ACT nasal spray, Place 2 sprays into both nostrils daily., Disp: 16 g, Rfl: 6 .  fluticasone  furoate-vilanterol (BREO ELLIPTA) 100-25 MCG/INH AEPB, Inhale 1 puff into the lungs daily., Disp: 1 each, Rfl: 2 .  metroNIDAZOLE (FLAGYL) 500 MG tablet, Take 1 tablet (500 mg total) by mouth 2 (two) times daily., Disp: 14 tablet, Rfl: 0 .  montelukast (SINGULAIR) 10 MG tablet, TAKE 1 TABLET BY MOUTH AT BEDTIME (Patient not taking: Reported on 10/22/2018), Disp: 30 tablet, Rfl: 0 .  nitrofurantoin, macrocrystal-monohydrate, (MACROBID) 100 MG capsule, Take 1 capsule (100 mg total) by mouth 2 (two) times daily. 1 po BId, Disp: 20 capsule, Rfl: 0 .  omeprazole (PRILOSEC) 40 MG capsule, TAKE 1 CAPSULE BY MOUTH ONCE DAILY (Patient not taking: Reported on 10/22/2018), Disp: 30 capsule, Rfl: 0 .  phenazopyridine (PYRIDIUM) 100 MG tablet, Take 1 tablet (100 mg total) by mouth 3 (three) times daily as needed for pain., Disp: 10 tablet, Rfl: 0 .  Spacer/Aero Chamber Mouthpiece MISC, 1 each by Does not apply route every 6 (six) hours as needed., Disp: 1 each, Rfl: 0  Assessment/ Plan: 38 y.o. female   1. Cystitis - nitrofurantoin, macrocrystal-monohydrate, (MACROBID) 100 MG capsule; Take 1 capsule (100 mg total) by mouth 2 (two) times daily. 1 po BId  Dispense: 20 capsule; Refill: 0 - phenazopyridine (PYRIDIUM) 100 MG tablet; Take 1 tablet (100 mg total) by mouth 3 (three) times daily as needed for pain.  Dispense:  10 tablet; Refill: 0   No follow-ups on file.  Continue all other maintenance medications as listed above.  Start time: 4:49 PM End time: 5:02 PM  Meds ordered this encounter  Medications  . nitrofurantoin, macrocrystal-monohydrate, (MACROBID) 100 MG capsule    Sig: Take 1 capsule (100 mg total) by mouth 2 (two) times daily. 1 po BId    Dispense:  20 capsule    Refill:  0    Order Specific Question:   Supervising Provider    Answer:   Janora Norlander [[id number]]  . phenazopyridine (PYRIDIUM) 100 MG tablet    Sig: Take 1 tablet (100 mg total) by mouth 3 (three) times daily as needed for  pain.    Dispense:  10 tablet    Refill:  0    Order Specific Question:   Supervising Provider    Answer:   Janora Norlander [[id number]]    Particia Nearing PA-C [place] [phone]

## 2018-11-06 ENCOUNTER — Telehealth: Payer: Self-pay | Admitting: Adult Health

## 2018-11-06 MED ORDER — ESCITALOPRAM OXALATE 10 MG PO TABS: 10.0000 mg | ORAL_TABLET | Freq: Every day | ORAL | 6 refills | Status: DC

## 2018-11-06 MED ORDER — HYDROXYZINE HCL 10 MG PO TABS: 10.0000 mg | ORAL_TABLET | Freq: Three times a day (TID) | ORAL | 1 refills | Status: DC | PRN

## 2018-11-06 NOTE — Telephone Encounter (Signed)
Pt has appt 11/10/18 with surgeon, has recurrent right breast cancer, she is teary will rx lexapro and vistaril for her

## 2018-11-10 ENCOUNTER — Other Ambulatory Visit: Payer: Self-pay | Admitting: Surgery

## 2018-11-10 DIAGNOSIS — C50911 Malignant neoplasm of unspecified site of right female breast: Secondary | ICD-10-CM | POA: Diagnosis not present

## 2018-11-11 ENCOUNTER — Other Ambulatory Visit: Payer: Self-pay | Admitting: Surgery

## 2018-11-11 ENCOUNTER — Telehealth: Payer: Self-pay | Admitting: Hematology and Oncology

## 2018-11-11 DIAGNOSIS — C50911 Malignant neoplasm of unspecified site of right female breast: Secondary | ICD-10-CM

## 2018-11-11 DIAGNOSIS — Z170 Estrogen receptor positive status [ER+]: Secondary | ICD-10-CM

## 2018-11-11 NOTE — Telephone Encounter (Signed)
Scheduled appt per 7/20 sch message - unable to reach pt . Left message with appt date and time

## 2018-11-11 NOTE — Progress Notes (Signed)
[facility] CONSULT NOTE  Patient Care Team: Rakes, Connye Burkitt, FNP as PCP - General (Family Medicine) Gala Romney, Cristopher Estimable, MD as Consulting Physician (Gastroenterology)  CHIEF COMPLAINTS/PURPOSE OF CONSULTATION:  Newly diagnosed breast cancer  HISTORY OF PRESENTING ILLNESS:  Adriana Moon 38 y.o. female is here because of recent diagnosis of invasive ductal carcinoma of the right breast. The cancer was detected on a diagnostic mammogram on 10/29/18 after the patient palpated right breast changes. It showed four masses in the right breast measuring 1.0cm at 2 o'clock, 0.8cm at 10 o'clock, and 0.7cm and 0.5cm at 9:30, with no right axillary adenopathy. Biopsy on 11/03/18 showed invasive ductal carcinoma, grade 2, HER-2 negative (1+), ER 100%, PR 100%, Ki67 30%. She has a personal history of right breast cancer in 2009 treated with lumpectomy, radiation and tamoxifen with Zoladex. She also has a history of right breast atypical ductal hyperplasia in 2016 following a bilateral breast reduction, for which tamoxifen was restarted. I last saw her in 02/2015. She presents to the clinic today for evaluation and discussion of treatment options.   I reviewed her records extensively and collaborated the history with the patient.  SUMMARY OF ONCOLOGIC HISTORY: Oncology History  Breast cancer of lower-outer quadrant of right female breast ([place])  06/17/2007 Surgery   right lumpectomy: Invasive ductal carcinoma 1.8 cm with abundant extracellular mucin 1.8 cm, grade 2, lymphovascular invasion present, margins negative, 7 lymph nodes negative, T1 cN0 stage IA   07/15/2007 - 08/27/2007 Radiation Therapy   adjuvant radiation therapy   09/25/2007 - 09/24/2009 Anti-estrogen oral therapy   Tamoxifen with Zoladex for 2 years patient stopped it because she felt foggy in the head   01/25/2015 Surgery   breast reduction surgery: Right breast atypical ductal hyperplasia microscopic focus left breast  benign   02/25/2015 -  Anti-estrogen oral therapy   tamoxifen 20 mg daily restarted   11/03/2018 Relapse/Recurrence   Patient palpated right breast changes. Mammogram showed 4 masses in the right breast measuring 1.0cm at 2 o'clock, 0.8cm at 10 o'clock, and 0.7cm and 0.5cm at 9:30, with no right axillary adenopathy. Biopsy confirmed IDC, grade 2, HER-2 - (1+), ER +100%, PR+ 100%, Ki67 30%.    Malignant neoplasm of upper-outer quadrant of right breast in female, estrogen receptor positive ([place])  11/03/2018 Cancer Staging   Staging form: Breast, AJCC 8th Edition - Clinical stage from 11/03/2018: Stage IA (cT1b, cN0, cM0, G2, ER+, PR+, HER2-) - Signed by Gardenia Phlegm, NP on 11/12/2018   11/03/2018 Initial Diagnosis   Palpable abnormality in the right breast status post right lower breast cancer in 2009 and bilateral reduction mammoplasty in 2016, ultrasound revealed 1 cm lobulated hypoechoic mass, additional masses 8 mm, 7 mm and 5 mm were noted.  Right breast biopsy 10 o'clock position: IDC grade 2, ER 100%, PR 5%, Ki-67 30%, HER-2 -1+: Biopsy 2 o'clock position: Fat necrosis      MEDICAL HISTORY:  Past Medical History:  Diagnosis Date  . Angio-edema   . Arthritis of both knees   . Asthma   . Breast cancer ([place])   . Breast disorder    cancer  . Migraines   . Urticaria     SURGICAL HISTORY: Past Surgical History:  Procedure Laterality Date  . BREAST LUMPECTOMY Right 2009  . BREAST REDUCTION SURGERY Bilateral 01/25/2015   Procedure: MAMMARY REDUCTION  (BREAST);  Surgeon: [name], MD;  Location: [facility];  Service: Plastics;  Laterality:  Bilateral;  . CESAREAN SECTION    . ENDOMETRIAL ABLATION    . REDUCTION MAMMAPLASTY Left   . RT lumpectomy      SOCIAL HISTORY: Social History   Socioeconomic History  . Marital status: Legally Separated    Spouse name: Not on file  . Number of children: 2  . Years of education: Not on file  . Highest  education level: Not on file  Occupational History  . Not on file  Social Needs  . Financial resource strain: Not on file  . Food insecurity    Worry: Not on file    Inability: Not on file  . Transportation needs    Medical: Not on file    Non-medical: Not on file  Tobacco Use  . Smoking status: Never Smoker  . Smokeless tobacco: Never Used  Substance and Sexual Activity  . Alcohol use: Yes    Alcohol/week: 0.0 standard drinks    Comment: occ.   . Drug use: No  . Sexual activity: Not Currently    Birth control/protection: None, Surgical  Lifestyle  . Physical activity    Days per week: Not on file    Minutes per session: Not on file  . Stress: Not on file  Relationships  . Social Herbalist on phone: Not on file    Gets together: Not on file    Attends religious service: Not on file    Active member of club or organization: Not on file    Attends meetings of clubs or organizations: Not on file    Relationship status: Not on file  . Intimate partner violence    Fear of current or ex partner: Not on file    Emotionally abused: Not on file    Physically abused: Not on file    Forced sexual activity: Not on file  Other Topics Concern  . Not on file  Social History Narrative  . Not on file    FAMILY HISTORY: Family History  Problem Relation Age of Onset  . Diabetes Father   . Hypertension Father   . Gout Father   . Cancer Paternal Grandmother   . Breast cancer Paternal Grandmother   . Emphysema Maternal Grandfather     ALLERGIES:  is allergic to prednisone; tolmetin; other; nsaids; and shellfish allergy.  MEDICATIONS:  Current Outpatient Medications  Medication Sig Dispense Refill  . albuterol (PROVENTIL HFA;VENTOLIN HFA) 108 (90 Base) MCG/ACT inhaler Inhale 2 puffs into the lungs every 6 (six) hours as needed for wheezing or shortness of breath. 1 Inhaler 2  . cetirizine (ZYRTEC) 10 MG tablet Take 1-2 tablets 1-2 times a day as needed 180 tablet 3   . cyclobenzaprine (FLEXERIL) 10 MG tablet Take 1 tablet (10 mg total) by mouth at bedtime as needed for muscle spasms. 30 tablet 2  . escitalopram (LEXAPRO) 10 MG tablet Take 1 tablet (10 mg total) by mouth daily. (Patient not taking: Reported on 11/12/2018) 30 tablet 6  . fluconazole (DIFLUCAN) 150 MG tablet Take 1 now and repeat 1 in 3 days then 1 every month before period 6 tablet 2  . fluticasone (FLONASE) 50 MCG/ACT nasal spray Place 2 sprays into both nostrils daily. 16 g 6  . fluticasone furoate-vilanterol (BREO ELLIPTA) 100-25 MCG/INH AEPB Inhale 1 puff into the lungs daily. 1 each 2  . hydrOXYzine (ATARAX/VISTARIL) 10 MG tablet Take 1 tablet (10 mg total) by mouth 3 (three) times daily as needed. Take 1 10 mg  tablet tid prn anxiety, can take 2 at HS if needed 30 tablet 1  . metroNIDAZOLE (FLAGYL) 500 MG tablet Take 1 tablet (500 mg total) by mouth 2 (two) times daily. (Patient not taking: Reported on 11/12/2018) 14 tablet 0  . montelukast (SINGULAIR) 10 MG tablet TAKE 1 TABLET BY MOUTH AT BEDTIME (Patient not taking: Reported on 10/22/2018) 30 tablet 0  . nitrofurantoin, macrocrystal-monohydrate, (MACROBID) 100 MG capsule Take 1 capsule (100 mg total) by mouth 2 (two) times daily. 1 po BId (Patient not taking: Reported on 11/12/2018) 20 capsule 0  . omeprazole (PRILOSEC) 40 MG capsule TAKE 1 CAPSULE BY MOUTH ONCE DAILY (Patient not taking: Reported on 10/22/2018) 30 capsule 0  . phenazopyridine (PYRIDIUM) 100 MG tablet Take 1 tablet (100 mg total) by mouth 3 (three) times daily as needed for pain. (Patient not taking: Reported on 11/12/2018) 10 tablet 0  . Spacer/Aero Chamber Mouthpiece MISC 1 each by Does not apply route every 6 (six) hours as needed. 1 each 0   No current facility-administered medications for this visit.     REVIEW OF SYSTEMS:   Constitutional: Denies fevers, chills or abnormal night sweats Eyes: Denies blurriness of vision, double vision or watery eyes Ears, nose, mouth,  throat, and face: Denies mucositis or sore throat Respiratory: Denies cough, dyspnea or wheezes Cardiovascular: Denies palpitation, chest discomfort or lower extremity swelling Gastrointestinal:  Denies nausea, heartburn or change in bowel habits Skin: Denies abnormal skin rashes Lymphatics: Denies new lymphadenopathy or easy bruising Neurological:Denies numbness, tingling or new weaknesses Behavioral/Psych: Mood is stable, no new changes  Breast: Palpable right breast lumps All other systems were reviewed with the patient and are negative.  PHYSICAL EXAMINATION: ECOG PERFORMANCE STATUS: 0 - Asymptomatic  Vitals:   11/12/18 1429  BP: 117/73  Pulse: 79  Resp: 20  Temp: 98.9 F (37.2 C)  SpO2: 99%   Filed Weights   11/12/18 1429  Weight: 239 lb 12.8 oz (108.8 kg)    GENERAL:alert, no distress and comfortable SKIN: skin color, texture, turgor are normal, no rashes or significant lesions EYES: normal, conjunctiva are pink and non-injected, sclera clear OROPHARYNX:no exudate, no erythema and lips, buccal mucosa, and tongue normal  NECK: supple, thyroid normal size, non-tender, without nodularity LYMPH:  no palpable lymphadenopathy in the cervical, axillary or inguinal LUNGS: clear to auscultation and percussion with normal breathing effort HEART: regular rate & rhythm and no murmurs and no lower extremity edema ABDOMEN:abdomen soft, non-tender and normal bowel sounds Musculoskeletal:no cyanosis of digits and no clubbing  PSYCH: alert & oriented x 3 with fluent speech NEURO: no focal motor/sensory deficits BREAST: No palpable nodules in breast. No palpable axillary or supraclavicular lymphadenopathy (exam performed in the presence of a chaperone)   LABORATORY DATA:  I have reviewed the data as listed Lab Results  Component Value Date   WBC 4.8 06/25/2017   HGB 12.4 06/25/2017   HCT 37.2 06/25/2017   MCV 83 06/25/2017   PLT 343 06/25/2017   Lab Results  Component Value  Date   NA 139 05/08/2017   K 4.0 05/08/2017   CL 101 05/08/2017   CO2 24 05/08/2017    RADIOGRAPHIC STUDIES: I have personally reviewed the radiological reports and agreed with the findings in the report.  ASSESSMENT AND PLAN:  Malignant neoplasm of upper-outer quadrant of right breast in female, estrogen receptor positive ([place]) 11/03/2018:Palpable abnormality in the right breast status post right lower breast cancer in 2009 and bilateral reduction mammoplasty in  2016, ultrasound revealed 1 cm lobulated hypoechoic mass, additional masses 8 mm, 7 mm and 5 mm were noted.  Right breast biopsy 10 o'clock position: IDC grade 2, ER 100%, PR 5%, Ki-67 30%, HER-2 -1+: Biopsy 2 o'clock position: Fat necrosis  T1BN0 stage Ia  Pathology and radiology counseling:Discussed with the patient, the details of pathology including the type of breast cancer,the clinical staging, the significance of ER, PR and HER-2/neu receptors and the implications for treatment. After reviewing the pathology in detail, we proceeded to discuss the different treatment options between surgery, radiation, chemotherapy, antiestrogen therapies.  Recommendations: Breast MRI 1. Bil Mastectomies followed by 2. Oncotype DX testing to determine if chemotherapy would be of any benefit followed by 3. Adjuvant antiestrogen therapy  Oncotype counseling: I discussed Oncotype DX test. I explained to the patient that this is a 21 gene panel to evaluate patient tumors DNA to calculate recurrence score. This would help determine whether patient has high risk or intermediate risk or low risk breast cancer. She understands that if her tumor was found to be high risk, she would benefit from systemic chemotherapy. If low risk, no need of chemotherapy. If she was found to be intermediate risk, we would need to evaluate the score as well as other risk factors and determine if an abbreviated chemotherapy may be of benefit.  Return to clinic after  surgery to discuss final pathology report and then determine if Oncotype DX testing will need to be sent.     All questions were answered. The patient knows to call the clinic with any problems, questions or concerns.   Rulon Eisenmenger, MD 11/12/2018    I, Molly Dorshimer, am acting as scribe for Nicholas Lose, MD.  I have reviewed the above documentation for accuracy and completeness, and I agree with the above.

## 2018-11-11 NOTE — Progress Notes (Signed)
Location of Breast Cancer: upper inner quadrant of right breast   Histology per Pathology Report: 11/03/18:  Diagnosis 1. Breast, right, needle core biopsy, 2 o'clock - FAT NECROSIS WITH CALCIFICATIONS. - THERE IS NO EVIDENCE OF MALIGNANCY. 2. Breast, right, needle core biopsy, 10 o'clock - INVASIVE DUCTAL CARCINOMA. - SEE COMMENT. Microscopic Comment 2. The carcinoma appears grade II.  Receptor Status: ER(100%), PR (100%), Her2-neu (negative, 1+), Ki-(30%)  Did patient present with symptoms (if so, please note symptoms) or was this found on screening mammography?:  38 year old female complaining of palpable abnormalities in the right breast. Status post lumpectomy for right breast cancer in 2009 and bilateral reduction mammoplasty in 2016. 10/29/18 mammography: IMPRESSION: Four indeterminate masses in the right breast in a patient with history of breast cancer at the age of 45.  RECOMMENDATION: I recommend ultrasound-guided core biopsies of the masses in the right breast at 2 o'clock 7 cm from the nipple and at 10 o'clock 7 cm from the nipple. If these masses are benign I would recommend short-term interval follow-up of the additional 2 masses in the right breast at 9:30 7 cm from the nipple as well as evaluation with MRI given the patient's history of breast cancer at the age of 82.   Breast:  No dominant palpable mass, retraction, or nipple discharge on the left, on th right has 1 cm tender mass at 11-12 o'clock, and has irregularities through out, she is spt lumpectomy on right got breast cancer and has had bilateral breast reduction.  Past/Anticipated interventions by surgeon, if any: Pt had 11/10/18 appt with surgeon.  Past/Anticipated interventions by medical oncology, if any: Chemotherapy Appt [facility] 11/12/18                                                           Lymphedema issues, if any:  Pt has not had surgery at this time.    Pain issues, if any:  Pt denies c/o pain.    SAFETY ISSUES:  Prior radiation? Yes, RIGHT breast 3/24-08/27/2007  Pacemaker/ICD? No  Possible current pregnancy? No, LMP 11/06/18  Is the patient on methotrexate? No  Current Complaints / other details:  Pt presents today for reconsult with Dr. Sondra Come for Radiation Oncology. Pt has seen Dr. Sondra Come in 2009 for previous Radiation treatment.   BP 114/82 (BP Location: Left Arm, Patient Position: Sitting)   Pulse 63   Temp 98.7 F (37.1 C) (Temporal)   Resp (!) 22   Ht 5' (1.524 m)   Wt 237 lb 4 oz (107.6 kg)   SpO2 100%   BMI 46.33 kg/m   Wt Readings from Last 3 Encounters:  11/12/18 237 lb 4 oz (107.6 kg)  10/22/18 236 lb 12.8 oz (107.4 kg)  07/11/18 258 lb (117 kg)       Adriana Sousa, RN 11/12/2018,9:11 AM   Per Dr. Lindi Adie 02/25/15:  SUMMARY OF ONCOLOGIC HISTORY:        Breast cancer of lower-outer quadrant of right female breast ([name])   06/17/2007 Surgery right lumpectomy: Invasive ductal carcinoma 1.8 cm with abundant extracellular mucin 1.8 cm, grade 2, lymphovascular invasion present, margins negative, 7 lymph nodes negative, T1 cN0 stage IA   07/15/2007 - 08/27/2007 Radiation Therapy adjuvant radiation therapy   09/25/2007 - 09/24/2009 Anti-estrogen oral therapy Tamoxifen  with Zoladex for 2 years patient stopped it because she felt foggy in the head   01/25/2015 Surgery breast reduction surgery: Right breast atypical ductal hyperplasia microscopic focus left breast benign   02/25/2015 -  Anti-estrogen oral therapy tamoxifen 20 mg daily restarted

## 2018-11-12 ENCOUNTER — Ambulatory Visit
Admission: RE | Admit: 2018-11-12 | Discharge: 2018-11-12 | Disposition: A | Discharge status: Home / Self Care | Payer: BC Managed Care – PPO | Source: Ambulatory Visit | Attending: Radiation Oncology | Admitting: Radiation Oncology

## 2018-11-12 ENCOUNTER — Other Ambulatory Visit: Payer: Self-pay | Admitting: Genetic Counselor

## 2018-11-12 ENCOUNTER — Other Ambulatory Visit: Payer: Self-pay

## 2018-11-12 ENCOUNTER — Encounter: Payer: Self-pay | Admitting: *Deleted

## 2018-11-12 ENCOUNTER — Other Ambulatory Visit: Payer: BC Managed Care – PPO

## 2018-11-12 ENCOUNTER — Encounter: Payer: Self-pay | Admitting: Genetic Counselor

## 2018-11-12 ENCOUNTER — Inpatient Hospital Stay: Payer: BC Managed Care – PPO | Attending: Hematology and Oncology | Admitting: Hematology and Oncology

## 2018-11-12 ENCOUNTER — Ambulatory Visit (HOSPITAL_BASED_OUTPATIENT_CLINIC_OR_DEPARTMENT_OTHER): Payer: BC Managed Care – PPO | Admitting: Genetic Counselor

## 2018-11-12 ENCOUNTER — Encounter: Payer: Self-pay | Admitting: Adult Health

## 2018-11-12 ENCOUNTER — Encounter: Payer: Self-pay | Admitting: Radiation Oncology

## 2018-11-12 VITALS — BP 114/82 | HR 63 | Temp 98.7°F | Resp 22 | Ht 60.0 in | Wt 237.2 lb

## 2018-11-12 DIAGNOSIS — Z803 Family history of malignant neoplasm of breast: Secondary | ICD-10-CM | POA: Diagnosis not present

## 2018-11-12 DIAGNOSIS — Z8051 Family history of malignant neoplasm of kidney: Secondary | ICD-10-CM | POA: Diagnosis not present

## 2018-11-12 DIAGNOSIS — Z801 Family history of malignant neoplasm of trachea, bronchus and lung: Secondary | ICD-10-CM | POA: Insufficient documentation

## 2018-11-12 DIAGNOSIS — Z170 Estrogen receptor positive status [ER+]: Secondary | ICD-10-CM

## 2018-11-12 DIAGNOSIS — Z853 Personal history of malignant neoplasm of breast: Secondary | ICD-10-CM | POA: Insufficient documentation

## 2018-11-12 DIAGNOSIS — Z7951 Long term (current) use of inhaled steroids: Secondary | ICD-10-CM | POA: Insufficient documentation

## 2018-11-12 DIAGNOSIS — Z8042 Family history of malignant neoplasm of prostate: Secondary | ICD-10-CM | POA: Diagnosis not present

## 2018-11-12 DIAGNOSIS — Z79899 Other long term (current) drug therapy: Secondary | ICD-10-CM | POA: Insufficient documentation

## 2018-11-12 DIAGNOSIS — Z923 Personal history of irradiation: Secondary | ICD-10-CM | POA: Insufficient documentation

## 2018-11-12 DIAGNOSIS — M129 Arthropathy, unspecified: Secondary | ICD-10-CM | POA: Diagnosis not present

## 2018-11-12 DIAGNOSIS — C50411 Malignant neoplasm of upper-outer quadrant of right female breast: Secondary | ICD-10-CM | POA: Insufficient documentation

## 2018-11-12 DIAGNOSIS — J45909 Unspecified asthma, uncomplicated: Secondary | ICD-10-CM

## 2018-11-12 DIAGNOSIS — Z79810 Long term (current) use of selective estrogen receptor modulators (SERMs): Secondary | ICD-10-CM | POA: Insufficient documentation

## 2018-11-12 NOTE — Progress Notes (Signed)
Radiation Oncology         [phone] ________________________________  Initial Outpatient Consultation  Name: Adriana Moon MRN: [id number]  Date: 11/12/2018  DOB: [birth year]  WC:BJSEG, Connye Burkitt, FNP  Coralie Keens, MD   REFERRING PHYSICIAN: Coralie Keens, MD  DIAGNOSIS: The encounter diagnosis was Malignant neoplasm of upper-outer quadrant of right breast in female, estrogen receptor positive ([place]).  HISTORY OF PRESENT ILLNESS::Adriana Moon is a 38 y.o. female who has a history of right breast cancer, s/p lumpectomy in 2009, and bilateral reduction mammoplasty in 2016. Patient received radiation therapy at [facility] over approximately 6.5 weeks under my guidance.   She presented with palpable abnormalities in the right breast. She underwent bilateral diagnostic mammography with tomography and right breast ultrasonography at [facility] on 10/29/2018 showing: 4 indeterminate masses in the right breast, located at 2 o'clock, 10 o'clock, and two at 9:30; recommended biopsies of the 2 o'clock and 10 o'clock masses; no enlarged adenopathy.  Biopsy on 11/03/2018 showed: fat necrosis with calcifications at 2 o'clock; invasive ductal carcinoma at 10 o'clock. Prognostic indicators significant for: estrogen receptor, 100% positive and progesterone receptor, 100% positive, both with strong staining intensity. Proliferation marker Ki67 at 30%. HER2 negative.  She is scheduled to meet with Dr. Lindi Adie later this afternoon. She is also scheduled for bilateral breast MRI on 11/19/2018.   PREVIOUS RADIATION THERAPY: Yes  07/15/07 - 08/27/07: Right Breast, [facility], radiation details pending,  6.5 weeks  PAST MEDICAL HISTORY:  Past Medical History:  Diagnosis Date   Angio-edema    Arthritis of both knees    Asthma    Breast cancer ([place])    Breast disorder    cancer   Family history of breast cancer    Family history of kidney cancer      Family history of lung cancer    Family history of prostate cancer    Migraines    Urticaria     PAST SURGICAL HISTORY: Past Surgical History:  Procedure Laterality Date   BREAST LUMPECTOMY Right 2009   BREAST REDUCTION SURGERY Bilateral 01/25/2015   Procedure: MAMMARY REDUCTION  (BREAST);  Surgeon: [name], MD;  Location: [place];  Service: Plastics;  Laterality: Bilateral;   CESAREAN SECTION     ENDOMETRIAL ABLATION     REDUCTION MAMMAPLASTY Left    RT lumpectomy      FAMILY HISTORY:  Family History  Problem Relation Age of Onset   Diabetes Father    Hypertension Father    Gout Father    Cancer Paternal Grandmother    Breast cancer Paternal Grandmother        late 9s   Emphysema Maternal Grandfather    Lung cancer Maternal Grandfather        47s   Prostate cancer Maternal Uncle        late 15s   Kidney cancer Other 64       4th degree paternal relative    SOCIAL HISTORY:  Social History   Tobacco Use   Smoking status: Never Smoker   Smokeless tobacco: Never Used  Substance Use Topics   Alcohol use: Yes    Alcohol/week: 0.0 standard drinks    Comment: occ.    Drug use: No    ALLERGIES:  Allergies  Allergen Reactions   Prednisone Swelling    Lost taste in her mouth   Tolmetin Hives and Swelling   Other Cough   Nsaids Hives and Swelling  Shellfish Allergy Hives and Swelling    MEDICATIONS:  Current Outpatient Medications  Medication Sig Dispense Refill   albuterol (PROVENTIL HFA;VENTOLIN HFA) 108 (90 Base) MCG/ACT inhaler Inhale 2 puffs into the lungs every 6 (six) hours as needed for wheezing or shortness of breath. 1 Inhaler 2   cetirizine (ZYRTEC) 10 MG tablet Take 1-2 tablets 1-2 times a day as needed 180 tablet 3   cyclobenzaprine (FLEXERIL) 10 MG tablet Take 1 tablet (10 mg total) by mouth at bedtime as needed for muscle spasms. 30 tablet 2   fluconazole (DIFLUCAN) 150 MG tablet  Take 1 now and repeat 1 in 3 days then 1 every month before period 6 tablet 2   fluticasone (FLONASE) 50 MCG/ACT nasal spray Place 2 sprays into both nostrils daily. 16 g 6   fluticasone furoate-vilanterol (BREO ELLIPTA) 100-25 MCG/INH AEPB Inhale 1 puff into the lungs daily. 1 each 2   hydrOXYzine (ATARAX/VISTARIL) 10 MG tablet Take 1 tablet (10 mg total) by mouth 3 (three) times daily as needed. Take 1 10 mg tablet tid prn anxiety, can take 2 at HS if needed 30 tablet 1   Spacer/Aero Chamber Mouthpiece MISC 1 each by Does not apply route every 6 (six) hours as needed. 1 each 0   escitalopram (LEXAPRO) 10 MG tablet Take 1 tablet (10 mg total) by mouth daily. (Patient not taking: Reported on 11/12/2018) 30 tablet 6   metroNIDAZOLE (FLAGYL) 500 MG tablet Take 1 tablet (500 mg total) by mouth 2 (two) times daily. (Patient not taking: Reported on 11/12/2018) 14 tablet 0   montelukast (SINGULAIR) 10 MG tablet TAKE 1 TABLET BY MOUTH AT BEDTIME (Patient not taking: Reported on 10/22/2018) 30 tablet 0   nitrofurantoin, macrocrystal-monohydrate, (MACROBID) 100 MG capsule Take 1 capsule (100 mg total) by mouth 2 (two) times daily. 1 po BId (Patient not taking: Reported on 11/12/2018) 20 capsule 0   omeprazole (PRILOSEC) 40 MG capsule TAKE 1 CAPSULE BY MOUTH ONCE DAILY (Patient not taking: Reported on 10/22/2018) 30 capsule 0   phenazopyridine (PYRIDIUM) 100 MG tablet Take 1 tablet (100 mg total) by mouth 3 (three) times daily as needed for pain. (Patient not taking: Reported on 11/12/2018) 10 tablet 0   No current facility-administered medications for this encounter.     REVIEW OF SYSTEMS:  A 10+ POINT REVIEW OF SYSTEMS WAS OBTAINED including neurology, dermatology, psychiatry, cardiac, respiratory, lymph, extremities, GI, GU, musculoskeletal, constitutional, reproductive, HEENT. She reports tenderness to the 11-12 o'clock area of her right breast. She denies any pain and any other symptoms.   PHYSICAL  EXAM:  height is 5' (1.524 m) and weight is 237 lb 4 oz (107.6 kg). Her temporal temperature is 98.7 F (37.1 C). Her blood pressure is 114/82 and her pulse is 63. Her respiration is 22 (abnormal) and oxygen saturation is 100%.   Lungs are clear to auscultation bilaterally. Heart has regular rate and rhythm. No palpable cervical, supraclavicular, or axillary adenopathy. Abdomen soft, non-tender, normal bowel sounds.' Left Breast: no palpable mass, nipple discharge or bleeding. She has scars on the anterior breast from her reduction mammoplasty. Right Breast: patient has scars in the inferior aspect from her previous reduction mammoplasty. She also has some telangiectases in the intramammary fold from her prior radiation treatments. She has some biopsy sites in the upper aspect of the breast.   ECOG = 1  0 - Asymptomatic (Fully active, able to carry on all predisease activities without restriction)  1 - Symptomatic but  completely ambulatory (Restricted in physically strenuous activity but ambulatory and able to carry out work of a light or sedentary nature. For example, light housework, office work)  2 - Symptomatic, <50% in bed during the day (Ambulatory and capable of all self care but unable to carry out any work activities. Up and about more than 50% of waking hours)  3 - Symptomatic, >50% in bed, but not bedbound (Capable of only limited self-care, confined to bed or chair 50% or more of waking hours)  4 - Bedbound (Completely disabled. Cannot carry on any self-care. Totally confined to bed or chair)  5 - Death   Eustace Pen MM, Creech RH, Tormey DC, et al. [phone]). "Toxicity and response criteria of the [facility]". [place] Oncol. 5 (6): 649-55  LABORATORY DATA:  Lab Results  Component Value Date   WBC 4.8 06/25/2017   HGB 12.4 06/25/2017   HCT 37.2 06/25/2017   MCV 83 06/25/2017   PLT 343 06/25/2017   NEUTROABS 2.0 06/25/2017   Lab Results  Component Value  Date   NA 139 05/08/2017   K 4.0 05/08/2017   CL 101 05/08/2017   CO2 24 05/08/2017   GLUCOSE 80 05/08/2017   CREATININE 0.99 05/08/2017   CALCIUM 9.0 05/08/2017      RADIOGRAPHY: US Breast Ltd Uni Right Inc Axilla  Result Date: 10/29/2018 CLINICAL DATA:  38 year old female complaining of palpable abnormalities in the right breast. Status post lumpectomy for right breast cancer in 2009 and bilateral reduction mammoplasty in 2016. EXAM: DIGITAL DIAGNOSTIC BILATERAL MAMMOGRAM WITH CAD AND TOMO ULTRASOUND RIGHT BREAST COMPARISON:  Previous exam(s). ACR Breast Density Category b: There are scattered areas of fibroglandular density. FINDINGS: Changes of reduction mammoplasty is seen bilaterally. No suspicious mass or malignant type microcalcifications identified in the left breast. There is nodularity and dystrophic appearing calcifications in the upper-outer quadrant of the right breast where the patient complains of a palpable abnormality. No malignant type microcalcifications identified in the right breast. Mammographic images were processed with CAD. On physical exam, I palpate minimal thickening in the right breast at 2 o'clock 7 cm from the nipple. I palpate a discrete nodule in the right breast at 10 o'clock 7 cm from the nipple. Targeted ultrasound is performed, showing a hypoechoic slightly irregular mass in the right breast at 2 o'clock 7 cm from the nipple measuring 9 x 5 x 10 mm. There is a lobulated hypoechoic mass in the right breast at 10 o'clock 7 cm from the nipple measuring 8 x 5 x 8 mm. There are 2 hypoechoic masses in the right breast at 9:30 7 cm from the nipple measuring 6 x 3 x 7 mm and 5 x 2 x 4 mm. Sonographic evaluation the right axilla does not show any enlarged adenopathy. IMPRESSION: Four indeterminate masses in the right breast in a patient with history of breast cancer at the age of 74. RECOMMENDATION: I recommend ultrasound-guided core biopsies of the masses in the right breast  at 2 o'clock 7 cm from the nipple and at 10 o'clock 7 cm from the nipple. If these masses are benign I would recommend short-term interval follow-up of the additional 2 masses in the right breast at 9:30 7 cm from the nipple as well as evaluation with MRI given the patient's history of breast cancer at the age of 55. I have discussed the findings and recommendations with the patient. Results were also provided in writing at the conclusion of the  visit. If applicable, a reminder letter will be sent to the patient regarding the next appointment. BI-RADS CATEGORY  4: Suspicious. Electronically Signed   By: Lillia Mountain M.D.   On: 10/29/2018 12:32   Mm Diag Breast Tomo Bilateral  Result Date: 10/29/2018 CLINICAL DATA:  38 year old female complaining of palpable abnormalities in the right breast. Status post lumpectomy for right breast cancer in 2009 and bilateral reduction mammoplasty in 2016. EXAM: DIGITAL DIAGNOSTIC BILATERAL MAMMOGRAM WITH CAD AND TOMO ULTRASOUND RIGHT BREAST COMPARISON:  Previous exam(s). ACR Breast Density Category b: There are scattered areas of fibroglandular density. FINDINGS: Changes of reduction mammoplasty is seen bilaterally. No suspicious mass or malignant type microcalcifications identified in the left breast. There is nodularity and dystrophic appearing calcifications in the upper-outer quadrant of the right breast where the patient complains of a palpable abnormality. No malignant type microcalcifications identified in the right breast. Mammographic images were processed with CAD. On physical exam, I palpate minimal thickening in the right breast at 2 o'clock 7 cm from the nipple. I palpate a discrete nodule in the right breast at 10 o'clock 7 cm from the nipple. Targeted ultrasound is performed, showing a hypoechoic slightly irregular mass in the right breast at 2 o'clock 7 cm from the nipple measuring 9 x 5 x 10 mm. There is a lobulated hypoechoic mass in the right breast at 10  o'clock 7 cm from the nipple measuring 8 x 5 x 8 mm. There are 2 hypoechoic masses in the right breast at 9:30 7 cm from the nipple measuring 6 x 3 x 7 mm and 5 x 2 x 4 mm. Sonographic evaluation the right axilla does not show any enlarged adenopathy. IMPRESSION: Four indeterminate masses in the right breast in a patient with history of breast cancer at the age of 13. RECOMMENDATION: I recommend ultrasound-guided core biopsies of the masses in the right breast at 2 o'clock 7 cm from the nipple and at 10 o'clock 7 cm from the nipple. If these masses are benign I would recommend short-term interval follow-up of the additional 2 masses in the right breast at 9:30 7 cm from the nipple as well as evaluation with MRI given the patient's history of breast cancer at the age of 55. I have discussed the findings and recommendations with the patient. Results were also provided in writing at the conclusion of the visit. If applicable, a reminder letter will be sent to the patient regarding the next appointment. BI-RADS CATEGORY  4: Suspicious. Electronically Signed   By: Lillia Mountain M.D.   On: 10/29/2018 12:32   Mm Clip Placement Right  Result Date: 11/03/2018 CLINICAL DATA:  Post biopsy mammogram of the right breast for clip placement. EXAM: DIAGNOSTIC RIGHT MAMMOGRAM POST ULTRASOUND BIOPSY COMPARISON:  Previous exam(s). FINDINGS: Mammographic images were obtained following ultrasound guided biopsy of 2 masses in the right breast. The ribbon shaped biopsy marking clip is well positioned in the upper inner right breast at the site of biopsy. The clip correlates with what appears to be an area of calcifying fat necrosis. The coil shaped biopsy marking clip is well positioned in the upper-outer quadrant of the right breast. IMPRESSION: 1. Appropriate positioning of the ribbon shaped biopsy marking clip at the site of the right breast mass at 2 o'clock. Appropriate positioning of the coil shaped biopsy marking clip in the  upper-outer quadrant of the right breast at the site of the mass at 10 o'clock. Final Assessment: Post Procedure Mammograms for  Marker Placement Electronically Signed   By: Ammie Ferrier M.D.   On: 11/03/2018 15:14   [name] Rt Breast Bx W Loc Dev 1st Lesion Img Bx Spec US Guide  Addendum Date: 11/04/2018   ADDENDUM REPORT: 11/04/2018 13:19 ADDENDUM: Pathology revealed 1- FAT NECROSIS WITH CALCIFICATIONS. THERE IS NO EVIDENCE OF MALIGNANCY of the RIGHT breast, 2 o'clock. This was found to be concordant by Dr. Ammie Ferrier. Pathology revealed 2- GRADE II INVASIVE DUCTAL CARCINOMA of the RIGHT breast, 10 o'clock. This was found to be concordant by Dr. Ammie Ferrier. Pathology results were discussed with the patient by telephone. The patient reported tenderness at the biopsy sites. Post biopsy instructions and care were reviewed and questions were answered. The patient was encouraged to call [facility] for any additional concerns. Patient has history of RIGHT breast cancer at age 77. Recommend bilateral breast MRI, which can determine extent of disease given the additional 2 masses identified on the diagnostic workup, as well as screen the contralateral breast given her initial diagnosis of cancer at the age of 26. The other 2 masses should be biopsied if breast conservation is an option and under consideration. Surgical consultation has been arranged with Dr. Coralie Keens at [facility] on November 10, 2018. Electronically Signed   By: Ammie Ferrier M.D.   On: 11/04/2018 13:19   Result Date: 11/04/2018 CLINICAL DATA:  38 year old female presenting for ultrasound-guided biopsy of 2 right breast masses. The patient has personal history of breast cancer diagnosed at the age of 15. EXAM: ULTRASOUND GUIDED RIGHT BREAST CORE NEEDLE BIOPSY COMPARISON:  Previous exam(s). FINDINGS: I met with the patient and we discussed the procedure of ultrasound-guided biopsy,  including benefits and alternatives. We discussed the high likelihood of a successful procedure. We discussed the risks of the procedure, including infection, bleeding, tissue injury, clip migration, and inadequate sampling. Informed written consent was given. The usual time-out protocol was performed immediately prior to the procedure. #1 Lesion quadrant: Upper inner quadrant Using sterile technique and 1% Lidocaine as local anesthetic, under direct ultrasound visualization, a 14 gauge spring-loaded device was used to perform biopsy of a mass in the right breast at 2 o'clock using an inferior approach. At the conclusion of the procedure a ribbon shaped tissue marker clip was deployed into the biopsy cavity. -------------------------------------------------------------------------------------------------------------------------------------------- #2 Lesion quadrant: Upper-outer quadrant Using sterile technique and 1% Lidocaine as local anesthetic, under direct ultrasound visualization, a 14 gauge spring-loaded device was used to perform biopsy of a mass in the right breast at 10 o'clock using an inferior approach. At the conclusion of the procedure a coil shaped tissue marker clip was deployed into the biopsy cavity. Follow up 2 view mammogram was performed and dictated separately. IMPRESSION: 1. Ultrasound guided biopsy of a right breast mass at 2 o'clock. No apparent complications. 2. Ultrasound guided biopsy of a right breast mass at 10 o'clock. No apparent complications. Electronically Signed: By: Ammie Ferrier M.D. On: 11/03/2018 14:37   [name] Rt Breast Bx W Loc Dev Ea Add Lesion Img Bx Spec US Guide  Addendum Date: 11/04/2018   ADDENDUM REPORT: 11/04/2018 13:19 ADDENDUM: Pathology revealed 1- FAT NECROSIS WITH CALCIFICATIONS. THERE IS NO EVIDENCE OF MALIGNANCY of the RIGHT breast, 2 o'clock. This was found to be concordant by Dr. Ammie Ferrier. Pathology revealed 2- GRADE II INVASIVE DUCTAL CARCINOMA  of the RIGHT breast, 10 o'clock. This was found to be concordant by Dr. Ammie Ferrier. Pathology results were discussed  with the patient by telephone. The patient reported tenderness at the biopsy sites. Post biopsy instructions and care were reviewed and questions were answered. The patient was encouraged to call [facility] for any additional concerns. Patient has history of RIGHT breast cancer at age 8. Recommend bilateral breast MRI, which can determine extent of disease given the additional 2 masses identified on the diagnostic workup, as well as screen the contralateral breast given her initial diagnosis of cancer at the age of 16. The other 2 masses should be biopsied if breast conservation is an option and under consideration. Surgical consultation has been arranged with Dr. Coralie Keens at [facility] on November 10, 2018. Electronically Signed   By: Ammie Ferrier M.D.   On: 11/04/2018 13:19   Result Date: 11/04/2018 CLINICAL DATA:  38 year old female presenting for ultrasound-guided biopsy of 2 right breast masses. The patient has personal history of breast cancer diagnosed at the age of 60. EXAM: ULTRASOUND GUIDED RIGHT BREAST CORE NEEDLE BIOPSY COMPARISON:  Previous exam(s). FINDINGS: I met with the patient and we discussed the procedure of ultrasound-guided biopsy, including benefits and alternatives. We discussed the high likelihood of a successful procedure. We discussed the risks of the procedure, including infection, bleeding, tissue injury, clip migration, and inadequate sampling. Informed written consent was given. The usual time-out protocol was performed immediately prior to the procedure. #1 Lesion quadrant: Upper inner quadrant Using sterile technique and 1% Lidocaine as local anesthetic, under direct ultrasound visualization, a 14 gauge spring-loaded device was used to perform biopsy of a mass in the right breast at 2 o'clock using an  inferior approach. At the conclusion of the procedure a ribbon shaped tissue marker clip was deployed into the biopsy cavity. -------------------------------------------------------------------------------------------------------------------------------------------- #2 Lesion quadrant: Upper-outer quadrant Using sterile technique and 1% Lidocaine as local anesthetic, under direct ultrasound visualization, a 14 gauge spring-loaded device was used to perform biopsy of a mass in the right breast at 10 o'clock using an inferior approach. At the conclusion of the procedure a coil shaped tissue marker clip was deployed into the biopsy cavity. Follow up 2 view mammogram was performed and dictated separately. IMPRESSION: 1. Ultrasound guided biopsy of a right breast mass at 2 o'clock. No apparent complications. 2. Ultrasound guided biopsy of a right breast mass at 10 o'clock. No apparent complications. Electronically Signed: By: Ammie Ferrier M.D. On: 11/03/2018 14:37      IMPRESSION: Stage IA (xT1b, cN0, cM0) Right Breast UOQ, Invasive Ductal Carcinoma, ER+ /PR+ /Her2-, Grade 2  The patient has received prior radiation therapy to the right breast and would therefore not be a candidate for breast conserving surgery followed by radiation therapy. I discussed this in detail with the patient. She understands the recommendation for mastectomy and is agreeable to this surgical approach.   PLAN:  1. Right mastectomy, possibly bilateral mastectomies, given her diagnoses of two breast cancers by age 70.  2. Oncotype DX testing to determine if chemotherapy would be of any benefit. 3. Adjuvant antiestrogen therapy, although the patient reports not tolerating tamoxifen well at all with her previous breast cancer.    ------------------------------------------------  Blair Promise, PhD, MD  This document serves as a record of services personally performed by Gery Pray, MD. It was created on his behalf by Wilburn Mylar, a trained medical scribe. The creation of this record is based on the scribe's personal observations and the provider's statements to them. This document has been checked and  approved by the attending provider.

## 2018-11-12 NOTE — Assessment & Plan Note (Signed)
11/03/2018:Palpable abnormality in the right breast status post right lower breast cancer in 2009 and bilateral reduction mammoplasty in 2016, ultrasound revealed 1 cm lobulated hypoechoic mass, additional masses 8 mm, 7 mm and 5 mm were noted.  Right breast biopsy 10 o'clock position: IDC grade 2, ER 100%, PR 5%, Ki-67 30%, HER-2 -1+: Biopsy 2 o'clock position: Fat necrosis  T1BN0 stage Ia  Pathology and radiology counseling:Discussed with the patient, the details of pathology including the type of breast cancer,the clinical staging, the significance of ER, PR and HER-2/neu receptors and the implications for treatment. After reviewing the pathology in detail, we proceeded to discuss the different treatment options between surgery, radiation, chemotherapy, antiestrogen therapies.  Recommendations: Breast MRI 1.  Mastectomy followed by 2. Oncotype DX testing to determine if chemotherapy would be of any benefit followed by 3. Adjuvant antiestrogen therapy  Oncotype counseling: I discussed Oncotype DX test. I explained to the patient that this is a 21 gene panel to evaluate patient tumors DNA to calculate recurrence score. This would help determine whether patient has high risk or intermediate risk or low risk breast cancer. She understands that if her tumor was found to be high risk, she would benefit from systemic chemotherapy. If low risk, no need of chemotherapy. If she was found to be intermediate risk, we would need to evaluate the score as well as other risk factors and determine if an abbreviated chemotherapy may be of benefit.  Return to clinic after surgery to discuss final pathology report and then determine if Oncotype DX testing will need to be sent.

## 2018-11-12 NOTE — Progress Notes (Signed)
REFERRING PROVIDER: Nicholas Lose, MD [address] [place],  [place] [address]  PRIMARY PROVIDER:  Baruch Gouty, FNP  PRIMARY REASON FOR VISIT:  1. Malignant neoplasm of upper-outer quadrant of right breast in female, estrogen receptor positive ([place])   2. Family history of breast cancer   3. Family history of prostate cancer   4. Family history of lung cancer   5. Family history of kidney cancer   6. History of breast cancer    I connected with Ms. Gilliam on 11/12/2018 at 3:00 pm EDT by Webex video conference and verified that I am speaking with the correct person using two identifiers.   Patient location: clinic Provider location: clinic   HISTORY OF PRESENT ILLNESS:   Ms. Delisi, a 38 y.o. female, was seen for a [place] cancer genetics consultation at the request of Dr. Lindi Adie due to a personal history of breast cancer and a family history of breast, prostate, lung, and kidney cancer.  Ms. Cerullo presents to clinic today to discuss the possibility of a hereditary predisposition to cancer, genetic testing, and to further clarify her future cancer risks, as well as potential cancer risks for family members.   In 2009, at the age of 48, Ms. Ruhe was diagnosed with IDC, ER+/PR+/Her2-, of the right breast. The treatment plan included surgery, radiation, and anti-estrogen therapy.   In 2020, at the age of 22, Ms. Khun was diagnosed with IDC, ER+/PR+/Her2-, of the right breast. The treatment plan includes surgery and antiestrogen therapy.  CANCER HISTORY:  Oncology History  Breast cancer of lower-outer quadrant of right female breast ([place])  06/17/2007 Surgery   right lumpectomy: Invasive ductal carcinoma 1.8 cm with abundant extracellular mucin 1.8 cm, grade 2, lymphovascular invasion present, margins negative, 7 lymph nodes negative, T1 cN0 stage IA   07/15/2007 - 08/27/2007 Radiation Therapy   adjuvant radiation therapy   09/25/2007 - 09/24/2009  Anti-estrogen oral therapy   Tamoxifen with Zoladex for 2 years patient stopped it because she felt foggy in the head   01/25/2015 Surgery   breast reduction surgery: Right breast atypical ductal hyperplasia microscopic focus left breast benign   02/25/2015 -  Anti-estrogen oral therapy   tamoxifen 20 mg daily restarted   11/03/2018 Relapse/Recurrence   Patient palpated right breast changes. Mammogram showed 4 masses in the right breast measuring 1.0cm at 2 o'clock, 0.8cm at 10 o'clock, and 0.7cm and 0.5cm at 9:30, with no right axillary adenopathy. Biopsy confirmed IDC, grade 2, HER-2 - (1+), ER +100%, PR+ 100%, Ki67 30%.    Malignant neoplasm of upper-outer quadrant of right breast in female, estrogen receptor positive ([place])  11/03/2018 Cancer Staging   Staging form: Breast, AJCC 8th Edition - Clinical stage from 11/03/2018: Stage IA (cT1b, cN0, cM0, G2, ER+, PR+, HER2-) - Signed by Gardenia Phlegm, NP on 11/12/2018   11/03/2018 Initial Diagnosis   Palpable abnormality in the right breast status post right lower breast cancer in 2009 and bilateral reduction mammoplasty in 2016, ultrasound revealed 1 cm lobulated hypoechoic mass, additional masses 8 mm, 7 mm and 5 mm were noted.  Right breast biopsy 10 o'clock position: IDC grade 2, ER 100%, PR 5%, Ki-67 30%, HER-2 -1+: Biopsy 2 o'clock position: Fat necrosis     Past Medical History:  Diagnosis Date  . Angio-edema   . Arthritis of both knees   . Asthma   . Breast cancer ([place])   . Breast disorder    cancer  . Family  history of breast cancer   . Family history of kidney cancer   . Family history of lung cancer   . Family history of prostate cancer   . Migraines   . Urticaria     Past Surgical History:  Procedure Laterality Date  . BREAST LUMPECTOMY Right 2009  . BREAST REDUCTION SURGERY Bilateral 01/25/2015   Procedure: MAMMARY REDUCTION  (BREAST);  Surgeon: [name], MD;  Location: [facility];   Service: Plastics;  Laterality: Bilateral;  . CESAREAN SECTION    . ENDOMETRIAL ABLATION    . REDUCTION MAMMAPLASTY Left   . RT lumpectomy      Social History   Socioeconomic History  . Marital status: Legally Separated    Spouse name: Not on file  . Number of children: 2  . Years of education: Not on file  . Highest education level: Not on file  Occupational History  . Not on file  Social Needs  . Financial resource strain: Not on file  . Food insecurity    Worry: Not on file    Inability: Not on file  . Transportation needs    Medical: Not on file    Non-medical: Not on file  Tobacco Use  . Smoking status: Never Smoker  . Smokeless tobacco: Never Used  Substance and Sexual Activity  . Alcohol use: Yes    Alcohol/week: 0.0 standard drinks    Comment: occ.   . Drug use: No  . Sexual activity: Not Currently    Birth control/protection: None, Surgical  Lifestyle  . Physical activity    Days per week: Not on file    Minutes per session: Not on file  . Stress: Not on file  Relationships  . Social Herbalist on phone: Not on file    Gets together: Not on file    Attends religious service: Not on file    Active member of club or organization: Not on file    Attends meetings of clubs or organizations: Not on file    Relationship status: Not on file  Other Topics Concern  . Not on file  Social History Narrative  . Not on file     FAMILY HISTORY:  We obtained a detailed, 4-generation family history.  Significant diagnoses are listed below: Family History  Problem Relation Age of Onset  . Diabetes Father   . Hypertension Father   . Gout Father   . Cancer Paternal Grandmother   . Breast cancer Paternal Grandmother   . Emphysema Maternal Grandfather      Ms. Rundell has two sons, Demetrius and [place]. She has one sister and a niece and a nephew.  She has one maternal uncle who had prostate cancer in has late 60s. Ms. Soman maternal  grandfather was diagnosed with lung cancer in his 87s and had a history of smoking. Among multiple first cousins there have been no known cancer diagnoses.  Ms. Goin paternal grandmother had breast cancer diagnosed in her late 63s. She also has a 4th degree relative on her paternal side who had kidney cancer when he was in his late 62s. Among multiple paternal first cousins there have been no known cancer diagnoses.  Ms. Mickelson is unaware of previous family history of genetic testing for hereditary cancer risks. Patient's maternal ancestors are of African American descent, and paternal ancestors are of African American descent. There is no reported Ashkenazi Jewish ancestry. There is no known consanguinity.  GENETIC COUNSELING  ASSESSMENT: Ms. Pilling is a 37 y.o. female with a personal and family history of breast cancer and a family history of prostate, lung, and kidney cancer, which is somewhat suggestive of a hereditary cancer syndrome and predisposition to cancer. We, therefore, discussed and recommended the following at today's visit.   DISCUSSION: We discussed that 5 - 10% of breast cancer is hereditary, with most cases associated with BRCA1/2.  There are other genes that can be associated with hereditary breast cancer cancer syndromes.  These include CDH1, PALB2, TP53, etc.  We discussed that testing is beneficial for several reasons including knowing about other cancer risks, identifying potential screening and risk-reduction options that may be appropriate, and to understand if other family members could be at risk for cancer and allow them to undergo genetic testing  We reviewed the characteristics, features and inheritance patterns of hereditary cancer syndromes. We discussed that it is possible for a pathogenic variant in a gene to start new in someone.  If that happened, we would not expect them to have a family history that is suspicious of a hereditary cancer syndrome, but that  individual could still be at a significantly increased risk for cancer.  We also discussed genetic testing, including the appropriate family members to test, the process of testing, insurance coverage and turn-around-time for results. We discussed the implications of a negative, positive and/or variant of uncertain significant result. In order to get genetic test results in a timely manner so that Ms. Person can use these genetic test results for surgical decisions, we recommended Ms. Lapre pursue genetic testing for the [facility] Breast Cancer STAT panel. Once complete, we recommend Ms. Rydberg pursue reflex genetic testing to the Common Hereditary Cancers gene panel.   The STAT Breast cancer panel offered by [facility] includes sequencing and rearrangement analysis for the following 9 genes:  ATM, BRCA1, BRCA2, CDH1, CHEK2, PALB2, PTEN, STK11 and TP53.    The Common Hereditary Gene Panel offered by [facility] includes sequencing and/or deletion duplication testing of the following 48 genes: APC, ATM, AXIN2, BARD1, BMPR1A, BRCA1, BRCA2, BRIP1, CDH1, CDK4, CDKN2A (p14ARF), CDKN2A (p16INK4a), CHEK2, CTNNA1, DICER1, EPCAM (Deletion/duplication testing only), GREM1 (promoter region deletion/duplication testing only), KIT, MEN1, MLH1, MSH2, MSH3, MSH6, MUTYH, NBN, NF1, NHTL1, PALB2, PDGFRA, PMS2, POLD1, POLE, PTEN, RAD50, RAD51C, RAD51D, RNF43, SDHB, SDHC, SDHD, SMAD4, SMARCA4. STK11, TP53, TSC1, TSC2, and VHL.  The following genes were evaluated for sequence changes only: SDHA and HOXB13 c.251G>A variant only.   Based on Ms. Oboyle's personal history of cancer, she meets medical criteria for genetic testing. Despite that she meets criteria, she may still have an out of pocket cost.   PLAN: After considering the risks, benefits, and limitations, Ms. Fukushima provided informed consent to pursue genetic testing and the blood sample was sent to [facility] for analysis of the Breast Cancer STAT panel +  Common Hereditary Cancers panel. Results should be available within approximately one weeks' time, at which point they will be disclosed by telephone to Ms. Hilscher, as will any additional recommendations warranted by these results. Ms. Dorough will receive a summary of her genetic counseling visit and a copy of her results once available. This information will also be available in Epic.   Ms. Desanto questions were answered to her satisfaction today. Our contact information was provided should additional questions or concerns arise. Thank you for the referral and allowing [name] to share in the care of your patient.   Clint Guy, MS Genetic Counselor [email or web address]  Phone: [phone]   The patient was seen for a total of 15 minutes in face-to-face genetic counseling.  This patient was discussed with Drs. Magrinat, Lindi Adie and/or Burr Medico who agrees with the above.    _______________________________________________________________________ For Office Staff:  Number of people involved in session: 1 Was an Intern/ student involved with case: no

## 2018-11-12 NOTE — Patient Instructions (Signed)
Coronavirus (COVID-19) Are you at risk?  Are you at risk for the Coronavirus (COVID-19)?  To be considered HIGH RISK for Coronavirus (COVID-19), you have to meet the following criteria:  . Traveled to [place], [place], [name], [place] or [place]; or in the [place] to [place], [place], [place], or [place]; and have fever, cough, and shortness of breath within the last 2 weeks of travel OR . Been in close contact with a person diagnosed with COVID-19 within the last 2 weeks and have fever, cough, and shortness of breath . IF YOU DO NOT MEET THESE CRITERIA, YOU ARE CONSIDERED LOW RISK FOR COVID-19.  What to do if you are HIGH RISK for COVID-19?  . If you are having a medical emergency, call 911. . Seek medical care right away. Before you go to a doctor's office, urgent care or emergency department, call ahead and tell them about your recent travel, contact with someone diagnosed with COVID-19, and your symptoms. You should receive instructions from your physician's office regarding next steps of care.  . When you arrive at healthcare provider, tell the healthcare staff immediately you have returned from visiting [place], [place], [place], [place] or [name]; or traveled in the [place] to [place], [place], [place], or [place]; in the last two weeks or you have been in close contact with a person diagnosed with COVID-19 in the last 2 weeks.   . Tell the health care staff about your symptoms: fever, cough and shortness of breath. . After you have been seen by a medical provider, you will be either: o Tested for (COVID-19) and discharged home on quarantine except to seek medical care if symptoms worsen, and asked to  - Stay home and avoid contact with others until you get your results (4-5 days)  - Avoid travel on public transportation if possible (such as bus, train, or airplane) or o Sent to the Emergency Department by EMS for evaluation, COVID-19 testing, and possible  admission depending on your condition and test results.  What to do if you are LOW RISK for COVID-19?  Reduce your risk of any infection by using the same precautions used for avoiding the common cold or flu:  . Wash your hands often with soap and warm water for at least 20 seconds.  If soap and water are not readily available, use an alcohol-based hand sanitizer with at least 60% alcohol.  . If coughing or sneezing, cover your mouth and nose by coughing or sneezing into the elbow areas of your shirt or coat, into a tissue or into your sleeve (not your hands). . Avoid shaking hands with others and consider head nods or verbal greetings only. . Avoid touching your eyes, nose, or mouth with unwashed hands.  . Avoid close contact with people who are sick. . Avoid places or events with large numbers of people in one location, like concerts or sporting events. . Carefully consider travel plans you have or are making. . If you are planning any travel outside or inside the [place], visit the CDC's Travelers' Health webpage for the latest health notices. . If you have some symptoms but not all symptoms, continue to monitor at home and seek medical attention if your symptoms worsen. . If you are having a medical emergency, call 911.   ADDITIONAL HEALTHCARE OPTIONS FOR PATIENTS  [facility] / e-Visit: [email or web address]         [facility]: [phone]  [facility]: [phone]                   [facility]: [phone]   

## 2018-11-13 ENCOUNTER — Ambulatory Visit: Payer: BC Managed Care – PPO | Admitting: Hematology and Oncology

## 2018-11-19 ENCOUNTER — Other Ambulatory Visit: Payer: BC Managed Care – PPO

## 2018-11-19 ENCOUNTER — Other Ambulatory Visit: Payer: Self-pay

## 2018-11-19 ENCOUNTER — Ambulatory Visit
Admission: RE | Admit: 2018-11-19 | Discharge: 2018-11-19 | Disposition: A | Discharge status: Home / Self Care | Payer: BC Managed Care – PPO | Source: Ambulatory Visit | Attending: Surgery | Admitting: Surgery

## 2018-11-19 ENCOUNTER — Inpatient Hospital Stay: Payer: BC Managed Care – PPO

## 2018-11-19 DIAGNOSIS — Z170 Estrogen receptor positive status [ER+]: Secondary | ICD-10-CM

## 2018-11-19 DIAGNOSIS — C50911 Malignant neoplasm of unspecified site of right female breast: Secondary | ICD-10-CM

## 2018-11-19 DIAGNOSIS — C50411 Malignant neoplasm of upper-outer quadrant of right female breast: Secondary | ICD-10-CM

## 2018-11-19 DIAGNOSIS — N641 Fat necrosis of breast: Secondary | ICD-10-CM | POA: Diagnosis not present

## 2018-11-19 MED ORDER — GADOBUTROL 1 MMOL/ML IV SOLN
10.0000 mL | Freq: Once | INTRAVENOUS | Status: AC | PRN
  Administered 2018-11-19: 10 mL via INTRAVENOUS

## 2018-11-20 ENCOUNTER — Other Ambulatory Visit: Payer: Self-pay | Admitting: Surgery

## 2018-11-20 DIAGNOSIS — N6320 Unspecified lump in the left breast, unspecified quadrant: Secondary | ICD-10-CM

## 2018-11-25 ENCOUNTER — Other Ambulatory Visit: Payer: Self-pay | Admitting: Surgery

## 2018-11-25 ENCOUNTER — Ambulatory Visit
Admission: RE | Admit: 2018-11-25 | Discharge: 2018-11-25 | Disposition: A | Discharge status: Home / Self Care | Payer: BC Managed Care – PPO | Source: Ambulatory Visit | Attending: Surgery | Admitting: Surgery

## 2018-11-25 ENCOUNTER — Other Ambulatory Visit: Payer: Self-pay

## 2018-11-25 DIAGNOSIS — N6324 Unspecified lump in the left breast, lower inner quadrant: Secondary | ICD-10-CM | POA: Diagnosis not present

## 2018-11-25 DIAGNOSIS — N6320 Unspecified lump in the left breast, unspecified quadrant: Secondary | ICD-10-CM

## 2018-11-25 DIAGNOSIS — N6322 Unspecified lump in the left breast, upper inner quadrant: Secondary | ICD-10-CM | POA: Diagnosis not present

## 2018-11-25 DIAGNOSIS — N641 Fat necrosis of breast: Secondary | ICD-10-CM | POA: Diagnosis not present

## 2018-11-25 DIAGNOSIS — R928 Other abnormal and inconclusive findings on diagnostic imaging of breast: Secondary | ICD-10-CM | POA: Diagnosis not present

## 2018-11-27 ENCOUNTER — Telehealth: Payer: Self-pay

## 2018-11-27 ENCOUNTER — Telehealth: Payer: Self-pay | Admitting: Adult Health

## 2018-11-27 NOTE — Telephone Encounter (Signed)
Has appt tomorrow, with plastic surgery office tomorrow, he will decide what she needs to do.

## 2018-11-27 NOTE — Telephone Encounter (Signed)

## 2018-11-27 NOTE — Telephone Encounter (Signed)
Patient really wants to speak to you.  There have been 5 cases of Covid at her work.  She has an upcoming breast cancer surgery and stated that she can not get Covid or they'll keep pushing her surgery out.  She is wanting to discuss with you.  She stated that 4 out of 5 have been within 2 weeks.  [phone]

## 2018-11-28 ENCOUNTER — Ambulatory Visit: Payer: BC Managed Care – PPO | Admitting: Plastic Surgery

## 2018-11-28 ENCOUNTER — Other Ambulatory Visit: Payer: Self-pay

## 2018-11-28 VITALS — BP 120/83 | HR 64 | Temp 98.7°F | Ht 60.0 in | Wt 240.0 lb

## 2018-11-28 DIAGNOSIS — Z6841 Body Mass Index (BMI) 40.0 and over, adult: Secondary | ICD-10-CM | POA: Diagnosis not present

## 2018-11-28 DIAGNOSIS — Z170 Estrogen receptor positive status [ER+]: Secondary | ICD-10-CM

## 2018-11-28 DIAGNOSIS — N189 Chronic kidney disease, unspecified: Secondary | ICD-10-CM

## 2018-11-28 DIAGNOSIS — C50411 Malignant neoplasm of upper-outer quadrant of right female breast: Secondary | ICD-10-CM | POA: Diagnosis not present

## 2018-11-28 DIAGNOSIS — C50511 Malignant neoplasm of lower-outer quadrant of right female breast: Secondary | ICD-10-CM

## 2018-11-29 ENCOUNTER — Encounter: Payer: Self-pay | Admitting: Plastic Surgery

## 2018-11-29 NOTE — Progress Notes (Addendum)
Patient ID: Adriana Moon, female    DOB: 29-Jul-1980, 38 y.o.   MRN: [id number]   Chief Complaint  Patient presents with  . Breast Cancer    The patient is a 38 year old female here for a breast reconstruction consultation.  She has been diagnosed with right-sided invasive ductal carcinoma.  In July she felt lesion of the right breast and went for a mammogram.  This was biopsied and found to be an invasive ductal carcinoma, grade 2, HER-2 negative, estrogen and progesterone positive and Ki-67 30%.  She has a history of right breast cancer from 2009 for which she underwent a lumpectomy and radiation.  She was then treated with tamoxifen and Zoladex.  In 2016 she had ductal hyperplasia and underwent bilateral breast reduction.  At that time the tamoxifen was restarted.  She is 5 feet tall and weighs 240 pounds.  Her current bra size is a 42C.  She would like to be around about the same size.  She is wanting a bilateral mastectomy.  The pictures show her scar pattern.  She healed the incisions nicely from her previous surgeries.  She is not a smoker.  She has gastroesophageal reflux and uses an inhaler.   Review of Systems  Constitutional: Negative.  Negative for activity change and appetite change.  HENT: Negative.   Eyes: Negative.  Negative for visual disturbance.  Respiratory: Negative for chest tightness and shortness of breath.   Cardiovascular: Negative for leg swelling.  Gastrointestinal: Negative.  Negative for abdominal distention and abdominal pain.  Endocrine: Negative.   Genitourinary: Negative.   Musculoskeletal: Negative.   Skin: Negative for color change and wound.  Hematological: Negative.   Psychiatric/Behavioral: Negative.     Past Medical History:  Diagnosis Date  . Angio-edema   . Arthritis of both knees   . Asthma   . Breast cancer ([place])   . Breast disorder    cancer  . Family history of breast cancer   . Family history of kidney cancer   .  Family history of lung cancer   . Family history of prostate cancer   . Migraines   . Urticaria     Past Surgical History:  Procedure Laterality Date  . BREAST LUMPECTOMY Right 2009  . BREAST REDUCTION SURGERY Bilateral 01/25/2015   Procedure: MAMMARY REDUCTION  (BREAST);  Surgeon: [name], MD;  Location: [facility];  Service: Plastics;  Laterality: Bilateral;  . CESAREAN SECTION    . ENDOMETRIAL ABLATION    . REDUCTION MAMMAPLASTY Left   . RT lumpectomy        Current Outpatient Medications:  .  albuterol (PROVENTIL HFA;VENTOLIN HFA) 108 (90 Base) MCG/ACT inhaler, Inhale 2 puffs into the lungs every 6 (six) hours as needed for wheezing or shortness of breath., Disp: 1 Inhaler, Rfl: 2 .  cetirizine (ZYRTEC) 10 MG tablet, Take 1-2 tablets 1-2 times a day as needed, Disp: 180 tablet, Rfl: 3 .  cyclobenzaprine (FLEXERIL) 10 MG tablet, Take 1 tablet (10 mg total) by mouth at bedtime as needed for muscle spasms., Disp: 30 tablet, Rfl: 2 .  escitalopram (LEXAPRO) 10 MG tablet, Take 1 tablet (10 mg total) by mouth daily. (Patient not taking: Reported on 11/12/2018), Disp: 30 tablet, Rfl: 6 .  fluconazole (DIFLUCAN) 150 MG tablet, Take 1 now and repeat 1 in 3 days then 1 every month before period, Disp: 6 tablet, Rfl: 2 .  fluticasone (FLONASE) 50 MCG/ACT nasal spray, Place  2 sprays into both nostrils daily., Disp: 16 g, Rfl: 6 .  fluticasone furoate-vilanterol (BREO ELLIPTA) 100-25 MCG/INH AEPB, Inhale 1 puff into the lungs daily., Disp: 1 each, Rfl: 2 .  hydrOXYzine (ATARAX/VISTARIL) 10 MG tablet, Take 1 tablet (10 mg total) by mouth 3 (three) times daily as needed. Take 1 10 mg tablet tid prn anxiety, can take 2 at HS if needed, Disp: 30 tablet, Rfl: 1 .  metroNIDAZOLE (FLAGYL) 500 MG tablet, Take 1 tablet (500 mg total) by mouth 2 (two) times daily. (Patient not taking: Reported on 11/12/2018), Disp: 14 tablet, Rfl: 0 .  montelukast (SINGULAIR) 10 MG tablet, TAKE 1  TABLET BY MOUTH AT BEDTIME (Patient not taking: Reported on 10/22/2018), Disp: 30 tablet, Rfl: 0 .  nitrofurantoin, macrocrystal-monohydrate, (MACROBID) 100 MG capsule, Take 1 capsule (100 mg total) by mouth 2 (two) times daily. 1 po BId (Patient not taking: Reported on 11/12/2018), Disp: 20 capsule, Rfl: 0 .  omeprazole (PRILOSEC) 40 MG capsule, TAKE 1 CAPSULE BY MOUTH ONCE DAILY (Patient not taking: Reported on 10/22/2018), Disp: 30 capsule, Rfl: 0 .  phenazopyridine (PYRIDIUM) 100 MG tablet, Take 1 tablet (100 mg total) by mouth 3 (three) times daily as needed for pain. (Patient not taking: Reported on 11/12/2018), Disp: 10 tablet, Rfl: 0 .  Spacer/Aero Chamber Mouthpiece MISC, 1 each by Does not apply route every 6 (six) hours as needed., Disp: 1 each, Rfl: 0   Objective:   Vitals:   11/28/18 1053  BP: 120/83  Pulse: 64  Temp: 98.7 F (37.1 C)  SpO2: 100%    Physical Exam Vitals signs and nursing note reviewed.  Constitutional:      Appearance: Normal appearance.  HENT:     Head: Normocephalic and atraumatic.  Eyes:     Extraocular Movements: Extraocular movements intact.     Pupils: Pupils are equal, round, and reactive to light.  Neck:     Musculoskeletal: Normal range of motion.  Cardiovascular:     Rate and Rhythm: Normal rate.     Pulses: Normal pulses.  Pulmonary:     Effort: Pulmonary effort is normal.  Abdominal:     General: Abdomen is flat. There is no distension.     Tenderness: There is no abdominal tenderness.  Musculoskeletal: Normal range of motion.  Skin:    General: Skin is warm.  Neurological:     General: No focal deficit present.     Mental Status: She is alert.  Psychiatric:        Mood and Affect: Mood normal.        Thought Content: Thought content normal.     Assessment & Plan:     ICD-10-CM   1. Malignant neoplasm of upper-outer quadrant of right breast in female, estrogen receptor positive ([place])  C50.411    Z17.0   2. BMI 45.0-49.9, adult  ([place])  Z68.42   3. Chronic kidney disease, unspecified CKD stage  N18.9   4. Malignant neoplasm of lower-outer quadrant of right breast of female, estrogen receptor positive ([place])  C50.511    Z17.0     Assessment and Plan:  A long, detailed conversation was had regarding the patient's options for breast reconstruction. Five main points, which are explained to all breast reconstruction patients, were discussed.  1. Breast reconstruction is an optional process.  2. Breast reconstruction is a multi-stage process which involves multiple surgeries spaced several months apart. The entire process can take over one year.  3. The major  goal of breast reconstruction is to have the patient look normal in clothing. When naked, there will always be scars.  4. Asymmetries are often present during the reconstruction process. Several operations may be needed, including surgery to the non-cancerous breast, to achieve satisfactory results.  5. No matter the reconstructive method, there are ways that the reconstruction can fail and a secondary reconstructive plan would need to be created.   A general discussion regarding all available methods of breast reconstruction were discussed. The types of reconstructions described included.  1. Tissue expander and implant based reconstruction, both single and multi-stage approaches.  2. Autologous only reconstructions, including free abdominal-tissue based reconstructions.  3. Combination procedures, particularly latissismus dorsi flaps combined with either expanders or implants.  For each of the reconstruction methods mentioned above, the risks, benefits, alternatives, scarring, and recovery time were discussed in great detail. Specific risks detailed included bleeding, infection, hematoma, seroma, scarring, pain, wound healing complications, flap loss, fat necrosis, capsular contracture, need for implant removal, donor site complications, bulge, hernia, umbilical necrosis,  need for urgent reoperation, and need for dressing changes were discussed.   Assessment  Once all reconstruction options were presented, a focused discussion was had regarding the patient's suitability for each of these procedures.  A total of 50 minutes of face-to-face time was spent in this encounter, of which >50% was spent in counseling.  Due to her history of right-sided breast radiation and bilateral breast reduction she is going to be a challenge for reconstruction.  After discussing all of the options.  The patient is leaning towards autologous reconstruction.  She may be a good candidate for a DIEP flap.  Pictures were obtained of the patient and placed in the chart with the patient's or guardian's permission. I have agreed to call the patient on Monday to discuss this further as she wanted some time to think things over.  I think that is a good idea.  And I will plan on calling her next week. [phone]  I [facility] Kirt [facility] at [facility].  Due to her elevated BMI of 40 she will not be a candidate for autologous reconstruction until she has gotten a lower.  I called and told the patient.  Her option is for bilateral tissue expander placement immediately postoperatively.  This would give her left breast volume while she is working on lowering her BMI.  This sounds like the plan we will follow.  I have also spoken with Dr. Ninfa Linden and they are in agreement of the plan. [place], DO

## 2018-12-01 ENCOUNTER — Encounter: Payer: Self-pay | Admitting: Genetic Counselor

## 2018-12-01 ENCOUNTER — Ambulatory Visit: Payer: Self-pay | Admitting: Genetic Counselor

## 2018-12-01 ENCOUNTER — Telehealth: Payer: Self-pay | Admitting: Genetic Counselor

## 2018-12-01 ENCOUNTER — Other Ambulatory Visit: Payer: Self-pay | Admitting: Surgery

## 2018-12-01 DIAGNOSIS — Z1379 Encounter for other screening for genetic and chromosomal anomalies: Secondary | ICD-10-CM

## 2018-12-01 NOTE — Telephone Encounter (Signed)
Revealed negative genetic testing.  Discussed that we do not know why she has had breast cancer at such a young age. It could be due to a different gene that we are not testing, or maybe our current technology may not be able to pick something up.  It will be important for her to keep in contact with genetics to keep up with whether additional testing may be needed. 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 Of note, her result is still considered normal and medical management should not change due to this VUS.

## 2018-12-01 NOTE — Progress Notes (Signed)
HPI:  Adriana Moon was previously seen in the [place] clinic due to a personal history of breast cancer and a family history of kidney, prostate, breast, and lung cancer, and concerns regarding a hereditary predisposition to cancer. Please refer to our prior cancer genetics clinic note for more information regarding our discussion, assessment and recommendations, at the time. Adriana Moon recent genetic test results were disclosed to her, as were recommendations warranted by these results. These results and recommendations are discussed in more detail below.  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 Mammogram showed 4 masses in the right breast measuring 1.0cm at 2 o'clock, 0.8cm at 10 o'clock, and 0.7cm and 0.5cm at 9:30, with no right axillary adenopathy. Biopsy confirmed IDC, grade 2, HER-2 - (1+), ER +100%, PR+ 100%, Ki67 30%.    11/28/2018 Genetic Testing   Negative genetic testing results on the [facility] Common Hereditary Cancers panel. 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  The Common Hereditary Gene Panel offered by [facility] includes sequencing and/or deletion duplication  testing of the following 48 genes: APC, ATM, AXIN2, BARD1, BMPR1A, BRCA1, BRCA2, BRIP1, CDH1, CDK4, CDKN2A (p14ARF), CDKN2A (p16INK4a), CHEK2, CTNNA1, DICER1, EPCAM (Deletion/duplication testing only), GREM1 (promoter region deletion/duplication testing only), KIT, MEN1, MLH1, MSH2, MSH3, MSH6, MUTYH, NBN, NF1, NHTL1, PALB2, PDGFRA, PMS2, POLD1, POLE, PTEN, RAD50, RAD51C, RAD51D, RNF43, SDHB, SDHC, SDHD, SMAD4, SMARCA4. STK11, TP53, TSC1, TSC2, and VHL.  The following genes were evaluated for sequence changes only: SDHA and HOXB13 c.251G>A variant only.    Malignant neoplasm of upper-outer quadrant of right breast in female, estrogen receptor positive ([place])  11/03/2018 Cancer Staging   Staging form: Breast, AJCC 8th Edition - Clinical stage from 11/03/2018: Stage IA (cT1b, cN0, cM0, G2, ER+, PR+, HER2-) - Signed by Gardenia Phlegm, NP on 11/12/2018   11/03/2018 Initial Diagnosis   Palpable abnormality in the right breast status post right lower breast cancer in 2009 and bilateral reduction mammoplasty in 2016, ultrasound revealed 1 cm lobulated hypoechoic mass, additional masses 8 mm, 7 mm and 5 mm were noted.  Right breast biopsy 10 o'clock position: IDC grade 2, ER 100%, PR 5%, Ki-67 30%, HER-2 -1+: Biopsy 2 o'clock position: Fat necrosis   11/28/2018 Genetic Testing   Negative genetic testing results on the [facility] Common Hereditary Cancers panel. 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  The Common Hereditary Gene Panel offered by [facility] includes sequencing and/or deletion duplication testing of the following 48 genes: APC, ATM, AXIN2, BARD1, BMPR1A, BRCA1, BRCA2, BRIP1, CDH1, CDK4, CDKN2A (p14ARF), CDKN2A (p16INK4a), CHEK2, CTNNA1, DICER1, EPCAM (Deletion/duplication testing only), GREM1 (promoter region deletion/duplication testing only), KIT, MEN1, MLH1, MSH2, MSH3, MSH6, MUTYH, NBN, NF1, NHTL1,  PALB2, PDGFRA, PMS2, POLD1, POLE, PTEN, RAD50,  RAD51C, RAD51D, RNF43, SDHB, SDHC, SDHD, SMAD4, SMARCA4. STK11, TP53, TSC1, TSC2, and VHL.  The following genes were evaluated for sequence changes only: SDHA and HOXB13 c.251G>A variant only.      FAMILY HISTORY:  We obtained a detailed, 4-generation family history.  Significant diagnoses are listed below: Family History  Problem Relation Age of Onset  . Diabetes Father   . Hypertension Father   . Gout Father   . Cancer Paternal Grandmother   . Breast cancer Paternal Grandmother        late 25s  . Emphysema Maternal Grandfather   . Lung cancer Maternal Grandfather        51s  . Prostate cancer Maternal Uncle        late 74s  . Kidney cancer Other 61       4th degree paternal relative      Adriana Moon has two sons, Adriana Moon and Adriana Moon. She has one sister and a niece and a nephew.  She has one maternal uncle who had prostate cancer in has late 64s. Adriana Moon maternal grandfather was diagnosed with lung cancer in his 4s and had a history of smoking. Among multiple first cousins there have been no known cancer diagnoses.  Adriana Moon paternal grandmother had breast cancer diagnosed in her late 5s. She also has a 4th degree relative on her paternal side who had kidney cancer when he was in his late 14s. Among multiple paternal first cousins there have been no known cancer diagnoses.  Adriana Moon is unaware of previous family history of genetic testing for hereditary cancer risks. Patient's maternal ancestors are of African American descent, and paternal ancestors are of African American descent. There is no reported Ashkenazi Jewish ancestry. There is no known consanguinity.  GENETIC TEST RESULTS: Genetic testing reported out on 11/28/2018 through the [facility] Common Hereditary Cancers panel found no pathogenic variants. The Common Hereditary Gene Panel offered by [facility] includes sequencing and/or deletion duplication testing of the following 48 genes: APC, ATM,  AXIN2, BARD1, BMPR1A, BRCA1, BRCA2, BRIP1, CDH1, CDK4, CDKN2A (p14ARF), CDKN2A (p16INK4a), CHEK2, CTNNA1, DICER1, EPCAM (Deletion/duplication testing only), GREM1 (promoter region deletion/duplication testing only), KIT, MEN1, MLH1, MSH2, MSH3, MSH6, MUTYH, NBN, NF1, NHTL1, PALB2, PDGFRA, PMS2, POLD1, POLE, PTEN, RAD50, RAD51C, RAD51D, RNF43, SDHB, SDHC, SDHD, SMAD4, SMARCA4. STK11, TP53, TSC1, TSC2, and VHL.  The following genes were evaluated for sequence changes only: SDHA and HOXB13 c.251G>A variant only. The test report will be scanned into EPIC and located under the Molecular Pathology section of the Results Review tab.  A portion of the result report is included below for reference.     We discussed with Adriana Moon that because current genetic testing is not perfect, it is possible there may be a gene mutation in one of these genes that current testing cannot detect, but that chance is small.  We also discussed, that there could be another gene that has not yet been discovered, or that we have not yet tested, that is responsible for the cancer diagnoses in the family. Therefore, it is important to remain in touch with cancer genetics in the future so that we can continue to offer Adriana Moon the most up to date genetic testing.   Genetic testing did identify a variant of uncertain significance (VUS) was identified in the NBN gene called c.2198A>G.  At this time, it is unknown if this variant is associated with increased cancer risk or if  this is a normal finding, but most variants such as this get reclassified to being inconsequential. It should not be used to make medical management decisions. With time, we suspect the lab will determine the significance of this variant, if any. If we do learn more about it, we will try to contact Adriana Moon to discuss it further. However, it is important to stay in touch with [name] periodically and keep the address and phone number up to date.  ADDITIONAL  GENETIC TESTING: We discussed with Adriana Moon that her genetic testing was fairly extensive.  If there are genes identified to increase cancer risk that can be analyzed in the future, we would be happy to discuss and coordinate this testing at that time.    CANCER SCREENING RECOMMENDATIONS: Adriana Moon test result is considered negative (normal).  This means that we have not identified a hereditary cause for her personal and family history of cancer at this time. Most cancers happen by chance and this negative test suggests that her cancer may fall into this category.    Given Adriana Moon's personal history, we must interpret these negative results with some caution.  Families with features suggestive of hereditary risk for cancer tend to have multiple family members with cancer, diagnoses in multiple generations and diagnoses before the age of 54. Adriana Moon personal and family history exhibits some of these features. Thus, this result may simply reflect our current inability to detect all mutations within these genes or there may be a different gene that has not yet been discovered or tested.   An individual's cancer risk and medical management are not determined by genetic test results alone. Overall cancer risk assessment incorporates additional factors, including personal medical history, family history, and any available genetic information that may result in a personalized plan for cancer prevention and surveillance.  RECOMMENDATIONS FOR FAMILY MEMBERS:  Individuals in this family might be at some increased risk of developing cancer, over the general population risk, simply due to the family history of cancer.  We recommended women in this family have a yearly mammogram beginning at age 32, or 55 years younger than the earliest onset of cancer, an annual clinical breast exam, and perform monthly breast self-exams. Women in this family should also have a gynecological exam as recommended  by their primary provider. All family members should have a colonoscopy by age 54.  FOLLOW-UP: Lastly, we discussed with Adriana Moon that cancer genetics is a rapidly advancing field and it is possible that new genetic tests will be appropriate for her and/or her family members in the future. We encouraged her to remain in contact with cancer genetics on an annual basis so we can update her personal and family histories and let her know of advances in cancer genetics that may benefit this family.   Our contact number was provided. Ms. Sianez questions were answered to her satisfaction, and she knows she is welcome to call us at anytime with additional questions or concerns.   Adriana Guy, MS Genetic Counselor [email or web address] Phone: [phone]

## 2018-12-02 DIAGNOSIS — M722 Plantar fascial fibromatosis: Secondary | ICD-10-CM | POA: Diagnosis not present

## 2018-12-02 DIAGNOSIS — M71571 Other bursitis, not elsewhere classified, right ankle and foot: Secondary | ICD-10-CM | POA: Diagnosis not present

## 2018-12-02 DIAGNOSIS — M71572 Other bursitis, not elsewhere classified, left ankle and foot: Secondary | ICD-10-CM | POA: Diagnosis not present

## 2018-12-03 ENCOUNTER — Other Ambulatory Visit: Payer: Self-pay | Admitting: Surgery

## 2018-12-03 DIAGNOSIS — Z853 Personal history of malignant neoplasm of breast: Secondary | ICD-10-CM

## 2018-12-09 ENCOUNTER — Telehealth: Payer: Self-pay | Admitting: Hematology and Oncology

## 2018-12-09 NOTE — Telephone Encounter (Signed)
Scheduled appt per 8/17 sch message - pt aware of appt date and time   

## 2018-12-15 ENCOUNTER — Encounter: Payer: Self-pay | Admitting: *Deleted

## 2018-12-15 ENCOUNTER — Telehealth: Payer: Self-pay

## 2018-12-15 NOTE — Progress Notes (Signed)
[facility] [address] [place] [address] Phone: [phone] Fax: [place], [place] [address] Phone: [phone] Fax: [phone]      Your procedure is scheduled on Monday 12/22/2018.  Report to [facility] Main Entrance "A" at 08:30 A.M., and check in at the Admitting office.  Call this number if you have problems the morning of surgery:  [phone]  Call [phone] if you have any questions prior to your surgery date Monday-Friday 8am-4pm    Remember:  Do not eat after midnight the night before your surgery  You may drink clear liquids until 07:30am the morning of your surgery.   Clear liquids allowed are: Water, Non-Citrus Juices (without pulp), Carbonated Beverages, Clear Tea, Black Coffee Only, and Gatorade    Take these medicines the morning of surgery with A SIP OF WATER: Albuterol inhaler (Proventil HFA; Ventolin HFA) - if needed Famotidine (Pepcid) - if needed Hydroxyzine (Atarax/Vistaril) - if needed  7 days prior to surgery STOP taking any Aspirin (unless otherwise instructed by your surgeon), Aleve, Naproxen, Ibuprofen, Motrin, Advil, Goody's, BC's, all herbal medications, fish oil, and all vitamins and supplements.    The Morning of Surgery  Do not wear jewelry, make-up or nail polish.  Do not wear lotions, powders, perfumes, or deodorant  Do not shave 48 hours prior to surgery.   Do not bring valuables to the hospital.  [facility] is not responsible for any belongings or valuables.  IF you are a smoker, DO NOT Smoke 24 hours prior to surgery  IF you wear a CPAP at night please bring your mask, tubing, and machine the morning of surgery   Remember that you must have someone to transport you home after your surgery, and remain with you for 24 hours if you are discharged the same day.   Contacts, eyeglasses, hearing  aids, dentures or bridgework may not be worn into surgery.    Leave your suitcase in the car.  After surgery it may be brought to your room.  For patients admitted to the hospital, discharge time will be determined by your treatment team.  Patients discharged the day of surgery will not be allowed to drive home.    Special instructions:   [facility]- Preparing For Surgery  Before surgery, you can play an important role. Because skin is not sterile, your skin needs to be as free of germs as possible. You can reduce the number of germs on your skin by washing with CHG (chlorahexidine gluconate) Soap before surgery.  CHG is an antiseptic cleaner which kills germs and bonds with the skin to continue killing germs even after washing.    Oral Hygiene is also important to reduce your risk of infection.  Remember - BRUSH YOUR TEETH THE MORNING OF SURGERY WITH YOUR REGULAR TOOTHPASTE  Please do not use if you have an allergy to CHG or antibacterial soaps. If your skin becomes reddened/irritated stop using the CHG.  Do not shave (including legs and underarms) for at least 48 hours prior to first CHG shower. It is OK to shave your face.  Please follow these instructions carefully.   1. Shower the NIGHT BEFORE SURGERY and the MORNING OF SURGERY with CHG Soap.   2. If you chose to wash your hair, wash your hair first as usual with your normal shampoo.  3. After you shampoo, rinse your hair and body thoroughly to remove the shampoo.  4. Use CHG as you would any other liquid soap. You can apply CHG directly to the skin and wash gently with a scrungie or a clean washcloth.   5. Apply the CHG Soap to your body ONLY FROM THE NECK DOWN.  Do not use on open wounds or open sores. Avoid contact with your eyes, ears, mouth and genitals (private parts). Wash Face and genitals (private parts)  with your normal soap.   6. Wash thoroughly, paying special attention to the area where your surgery will be  performed.  7. Thoroughly rinse your body with warm water from the neck down.  8. DO NOT shower/wash with your normal soap after using and rinsing off the CHG Soap.  9. Pat yourself dry with a CLEAN TOWEL.  10. Wear CLEAN PAJAMAS to bed the night before surgery, wear comfortable clothes the morning of surgery  11. Place CLEAN SHEETS on your bed the night of your first shower and DO NOT SLEEP WITH PETS.    Day of Surgery:  Please shower the morning of surgery with the CHG soap  Do not apply any deodorants/lotions. Please wear clean clothes to the hospital/surgery center.   Remember to brush your teeth WITH YOUR REGULAR TOOTHPASTE.   Please read over the following fact sheets that you were given.

## 2018-12-15 NOTE — Telephone Encounter (Signed)

## 2018-12-16 ENCOUNTER — Encounter (HOSPITAL_COMMUNITY)
Admission: RE | Admit: 2018-12-16 | Discharge: 2018-12-16 | Disposition: A | Discharge status: Home / Self Care | Payer: BC Managed Care – PPO | Source: Ambulatory Visit | Attending: Surgery | Admitting: Surgery

## 2018-12-16 ENCOUNTER — Other Ambulatory Visit: Payer: Self-pay

## 2018-12-16 ENCOUNTER — Encounter: Payer: Self-pay | Admitting: Surgical

## 2018-12-16 ENCOUNTER — Ambulatory Visit (INDEPENDENT_AMBULATORY_CARE_PROVIDER_SITE_OTHER): Payer: BC Managed Care – PPO | Admitting: Surgical

## 2018-12-16 ENCOUNTER — Encounter (HOSPITAL_COMMUNITY): Payer: Self-pay

## 2018-12-16 VITALS — BP 118/86 | HR 68 | Temp 97.3°F | Ht 60.0 in | Wt 259.8 lb

## 2018-12-16 DIAGNOSIS — M71571 Other bursitis, not elsewhere classified, right ankle and foot: Secondary | ICD-10-CM | POA: Diagnosis not present

## 2018-12-16 DIAGNOSIS — C50411 Malignant neoplasm of upper-outer quadrant of right female breast: Secondary | ICD-10-CM

## 2018-12-16 DIAGNOSIS — M722 Plantar fascial fibromatosis: Secondary | ICD-10-CM | POA: Diagnosis not present

## 2018-12-16 DIAGNOSIS — Z170 Estrogen receptor positive status [ER+]: Secondary | ICD-10-CM

## 2018-12-16 DIAGNOSIS — Z01812 Encounter for preprocedural laboratory examination: Secondary | ICD-10-CM | POA: Insufficient documentation

## 2018-12-16 DIAGNOSIS — C50511 Malignant neoplasm of lower-outer quadrant of right female breast: Secondary | ICD-10-CM

## 2018-12-16 LAB — BASIC METABOLIC PANEL
Anion gap: 8 (ref 5–15)
BUN: 16 mg/dL (ref 6–20)
CO2: 25 mmol/L (ref 22–32)
Calcium: 8.9 mg/dL (ref 8.9–10.3)
Chloride: 104 mmol/L (ref 98–111)
Creatinine, Ser: 1.08 mg/dL — ABNORMAL HIGH (ref 0.44–1.00)
GFR calc Af Amer: >60 mL/min (ref >60)
GFR calc non Af Amer: >60 mL/min (ref >60)
Glucose, Bld: 80 mg/dL (ref 70–99)
Potassium: 3.9 mmol/L (ref 3.5–5.1)
Sodium: 137 mmol/L (ref 135–145)

## 2018-12-16 LAB — CBC
HCT: 38.9 % (ref 36.0–46.0)
Hemoglobin: 12.8 g/dL (ref 12.0–15.0)
MCH: 28.6 pg (ref 26.0–34.0)
MCHC: 32.9 g/dL (ref 30.0–36.0)
MCV: 87 fL (ref 80.0–100.0)
Platelets: 344 10*3/uL (ref 150–400)
RBC: 4.47 MIL/uL (ref 3.87–5.11)
RDW: 14.4 % (ref 11.5–15.5)
WBC: 6.8 10*3/uL (ref 4.0–10.5)
nRBC: 0 % (ref 0.0–0.2)

## 2018-12-16 MED ORDER — HYDROCODONE-ACETAMINOPHEN 5-325 MG PO TABS: 1.0000 | ORAL_TABLET | Freq: Four times a day (QID) | ORAL | 0 refills | Status: DC | PRN

## 2018-12-16 MED ORDER — DIAZEPAM 2 MG PO TABS: 2.0000 mg | ORAL_TABLET | Freq: Two times a day (BID) | ORAL | 0 refills | Status: DC | PRN

## 2018-12-16 MED ORDER — ONDANSETRON HCL 4 MG PO TABS: 4.0000 mg | ORAL_TABLET | Freq: Three times a day (TID) | ORAL | 0 refills | Status: DC | PRN

## 2018-12-16 MED ORDER — CEPHALEXIN 500 MG PO CAPS: 500.0000 mg | ORAL_CAPSULE | Freq: Two times a day (BID) | ORAL | 0 refills | Status: AC

## 2018-12-16 NOTE — Progress Notes (Signed)
   Patient ID: Adriana Moon, female    DOB: 08/10/1980, 37 y.o.   MRN: [id number]  Chief Complaint  Patient presents with  . Pre-op Exam    for (B) immediate breast reconstruction w/ expander      ICD-10-CM   1. Malignant neoplasm of upper-outer quadrant of right breast in female, estrogen receptor positive (HCC)  C50.411    Z17.0   2. Malignant neoplasm of lower-outer quadrant of right breast of female, estrogen receptor positive (HCC)  C50.511    Z17.0      History of Present Illness: Adriana Moon is a 37 y.o.  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  She presents for preoperative evaluation for upcoming procedure, bilateral immediate breast reconstruction with placement of expanders and flex HD by Dr. Dillingham on 12/22/2018 after bilateral mastectomies with sentinel lymph node biopsies by Dr. Doug Blackman.   She does not smoke.  She has a history of GERD, CKD The patient has not had problems with anesthesia.   She has a history of radiation on the right side in 2009 after a lumpectomy. No history of chemotherapy.  Past Medical History: Allergies: Allergies  Allergen Reactions  . Prednisone Swelling    Lost taste in her mouth  . Tolmetin Hives and Swelling  . Nsaids Hives and Swelling  . Shellfish Allergy Hives and Swelling    Current Medications:  Current Outpatient Medications:  .  albuterol (PROVENTIL HFA;VENTOLIN HFA) 108 (90 Base) MCG/ACT inhaler, Inhale 2 puffs into the lungs every 6 (six) hours as needed for wheezing or shortness of breath., Disp: 1 Inhaler, Rfl: 2 .  cetirizine (ZYRTEC) 10 MG tablet, Take 1-2 tablets 1-2 times a day as needed, Disp: 180 tablet, Rfl: 3 .  Cranberry-Vit C-Probiotic-Ca (CRANBERRY PLUS PROBIOTIC PO), Take 1 capsule by mouth daily., Disp: , Rfl:  .  cyclobenzaprine (FLEXERIL) 10 MG tablet, Take 1 tablet (10 mg total) by mouth at bedtime  as needed for muscle spasms., Disp: 30 tablet, Rfl: 2 .  famotidine (PEPCID) 20 MG tablet, Take 20 mg by mouth daily as needed for heartburn or indigestion., Disp: , Rfl:  .  fluconazole (DIFLUCAN) 150 MG tablet, Take 1 now and repeat 1 in 3 days then 1 every month before period, Disp: 6 tablet, Rfl: 2 .  fluticasone (FLONASE) 50 MCG/ACT nasal spray, Place 2 sprays into both nostrils daily., Disp: 16 g, Rfl: 6 .  fluticasone furoate-vilanterol (BREO ELLIPTA) 100-25 MCG/INH AEPB, Inhale 1 puff into the lungs daily. (Patient taking differently: Inhale 1 puff into the lungs daily as needed (shortness of breath or wheezing). ), Disp: 1 each, Rfl: 2 .  hydrOXYzine (ATARAX/VISTARIL) 10 MG tablet, Take 1 tablet (10 mg total) by mouth 3 (three) times daily as needed. Take 1 10 mg tablet tid prn anxiety, can take 2 at HS if needed, Disp: 30 tablet, Rfl: 1 .  hydrOXYzine (ATARAX/VISTARIL) 25 MG tablet, Take 50 mg by mouth every 6 (six) hours as needed for anxiety or itching., Disp: , Rfl:  .  montelukast (SINGULAIR) 10 MG tablet, TAKE 1 TABLET BY MOUTH AT BEDTIME, Disp: 30 tablet, Rfl: 0 .  omeprazole (PRILOSEC) 40 MG capsule, TAKE 1 CAPSULE BY MOUTH ONCE DAILY, Disp: 30 capsule, Rfl: 0 .  Spacer/Aero Chamber Mouthpiece MISC, 1 each by Does not apply route every 6 (six) hours as needed., Disp: 1 each, Rfl: 0  Past Medical   Problems: Past Medical History:  Diagnosis Date  . Angio-edema   . Arthritis of both knees   . Asthma   . Breast cancer (HCC)   . Breast disorder    cancer  . Family history of breast cancer   . Family history of kidney cancer   . Family history of lung cancer   . Family history of prostate cancer   . Migraines   . Urticaria     Past Surgical History: Past Surgical History:  Procedure Laterality Date  . BREAST LUMPECTOMY Right 2009  . BREAST REDUCTION SURGERY Bilateral 01/25/2015   Procedure: MAMMARY REDUCTION  (BREAST);  Surgeon: Mary Ann Contogiannis, MD;  Location: [facility];  Service: Plastics;  Laterality: Bilateral;  . CESAREAN SECTION    . ENDOMETRIAL ABLATION    . REDUCTION MAMMAPLASTY Left   . RT lumpectomy      Social History: Social History   Socioeconomic History  . Marital status: Legally Separated    Spouse name: Not on file  . Number of children: 2  . Years of education: Not on file  . Highest education level: Not on file  Occupational History  . Not on file  Social Needs  . Financial resource strain: Not on file  . Food insecurity    Worry: Not on file    Inability: Not on file  . Transportation needs    Medical: Not on file    Non-medical: Not on file  Tobacco Use  . Smoking status: Never Smoker  . Smokeless tobacco: Never Used  Substance and Sexual Activity  . Alcohol use: Yes    Alcohol/week: 0.0 standard drinks    Comment: occ.   . Drug use: No  . Sexual activity: Not Currently    Birth control/protection: None, Surgical  Lifestyle  . Physical activity    Days per week: Not on file    Minutes per session: Not on file  . Stress: Not on file  Relationships  . Social connections    Talks on phone: Not on file    Gets together: Not on file    Attends religious service: Not on file    Active member of club or organization: Not on file    Attends meetings of clubs or organizations: Not on file    Relationship status: Not on file  . Intimate partner violence    Fear of current or ex partner: Not on file    Emotionally abused: Not on file    Physically abused: Not on file    Forced sexual activity: Not on file  Other Topics Concern  . Not on file  Social History Narrative  . Not on file    Family History: Family History  Problem Relation Age of Onset  . Diabetes Father   . Hypertension Father   . Gout Father   . Cancer Paternal Grandmother   . Breast cancer Paternal Grandmother        late 50s  . Emphysema Maternal Grandfather   . Lung cancer Maternal Grandfather        70s  . Prostate  cancer Maternal Uncle        late 50s  . Kidney cancer Other 48       4th degree paternal relative    Review of Systems: Review of Systems  Constitutional: Negative.   HENT: Negative.   Respiratory: Negative.   Cardiovascular: Negative.   Gastrointestinal: Negative.   Genitourinary: Negative.   Musculoskeletal:   Negative.   Skin: Negative.   Neurological: Negative.     Physical Exam: Vital Signs BP 118/86 (BP Location: Left Arm, Patient Position: Sitting, Cuff Size: Large)   Pulse 68   Temp (!) 97.3 F (36.3 C) (Temporal)   Ht 5' (1.524 m)   Wt 259 lb 12.8 oz (117.8 kg)   LMP 12/09/2018 (Exact Date)   SpO2 99%   BMI 50.74 kg/m  Physical Exam Exam conducted with a chaperone present.  Constitutional:      General: She is not in acute distress.    Appearance: Normal appearance. She is not ill-appearing.  HENT:     Head: Normocephalic and atraumatic.  Eyes:     Pupils: Pupils are equal, round Neck:     Musculoskeletal: Normal range of motion.  Cardiovascular:     Rate and Rhythm: Normal rate and regular rhythm.     Pulses: Normal pulses.     Heart sounds: Normal heart sounds. No murmur.  Pulmonary:     Effort: Pulmonary effort is normal. No respiratory distress.     Breath sounds: Normal breath sounds. No wheezing.  Abdominal:     General: Abdomen is flat. There is no distension.     Palpations: Abdomen is soft.     Tenderness: There is no abdominal tenderness.  Musculoskeletal: Normal range of motion.  Skin:    General: Skin is warm and dry.     Findings: No erythema or rash.  Neurological:     General: No focal deficit present.     Mental Status: She is alert and oriented to person, place, and time. Mental status is at baseline.     Motor: No weakness.  Psychiatric:        Mood and Affect: Mood normal.        Behavior: Behavior normal.    Assessment/Plan: Mrs. Gearin is scheduled for bilateral immediate breast reconstruction with placement of  expanders and flex HD by Dr. Dillingham on 12/22/2018 after bilateral mastectomies with sentinel lymph node biopsies by Dr. Doug Blackman.  Risks, benefits, and alternatives of procedure discussed, questions answered and consent obtained.    Patient going for pre-op labs today. Scheduled covid test. She had questions about if her nipple were to be removed or not, I was unable to inform her of the answer as I have not spoke with Dr. Blackman, but will try to find out. She is unsure if she has a pre-op scheduled with him.   The risks that can be encountered with and after placement of a breast expander placement were discussed and include the following but not limited to these: bleeding, infection, delayed healing, anesthesia risks, skin sensation changes, injury to structures including nerves, blood vessels, and muscles which may be temporary or permanent, allergies to tape, suture materials and glues, blood products, topical preparations or injected agents, skin contour irregularities, skin discoloration and swelling, deep vein thrombosis, cardiac and pulmonary complications, pain, which may persist, fluid accumulation, wrinkling of the skin over the expander, changes in nipple or breast sensation, expander leakage or rupture, faulty position of the expander, persistent pain, formation of tight scar tissue around the expander (capsular contracture), possible need for revisional surgery or staged procedures.    Electronically signed by: Matthew J Scheeler, PA-C 12/16/2018 9:51 AM  

## 2018-12-16 NOTE — H&P (View-Only) (Signed)
   Patient ID: Adriana Moon, female    DOB: 03/17/1981, 38 y.o.   MRN: [id number]  Chief Complaint  Patient presents with  . Pre-op Exam    for (B) immediate breast reconstruction w/ expander      ICD-10-CM   1. Malignant neoplasm of upper-outer quadrant of right breast in female, estrogen receptor positive (HCC)  C50.411    Z17.0   2. Malignant neoplasm of lower-outer quadrant of right breast of female, estrogen receptor positive (HCC)  C50.511    Z17.0      History of Present Illness: Adriana Moon is a 37 y.o.  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  She presents for preoperative evaluation for upcoming procedure, bilateral immediate breast reconstruction with placement of expanders and flex HD by Dr. Dillingham on 12/22/2018 after bilateral mastectomies with sentinel lymph node biopsies by Dr. Doug Blackman.   She does not smoke.  She has a history of GERD, CKD The patient has not had problems with anesthesia.   She has a history of radiation on the right side in 2009 after a lumpectomy. No history of chemotherapy.  Past Medical History: Allergies: Allergies  Allergen Reactions  . Prednisone Swelling    Lost taste in her mouth  . Tolmetin Hives and Swelling  . Nsaids Hives and Swelling  . Shellfish Allergy Hives and Swelling    Current Medications:  Current Outpatient Medications:  .  albuterol (PROVENTIL HFA;VENTOLIN HFA) 108 (90 Base) MCG/ACT inhaler, Inhale 2 puffs into the lungs every 6 (six) hours as needed for wheezing or shortness of breath., Disp: 1 Inhaler, Rfl: 2 .  cetirizine (ZYRTEC) 10 MG tablet, Take 1-2 tablets 1-2 times a day as needed, Disp: 180 tablet, Rfl: 3 .  Cranberry-Vit C-Probiotic-Ca (CRANBERRY PLUS PROBIOTIC PO), Take 1 capsule by mouth daily., Disp: , Rfl:  .  cyclobenzaprine (FLEXERIL) 10 MG tablet, Take 1 tablet (10 mg total) by mouth at bedtime  as needed for muscle spasms., Disp: 30 tablet, Rfl: 2 .  famotidine (PEPCID) 20 MG tablet, Take 20 mg by mouth daily as needed for heartburn or indigestion., Disp: , Rfl:  .  fluconazole (DIFLUCAN) 150 MG tablet, Take 1 now and repeat 1 in 3 days then 1 every month before period, Disp: 6 tablet, Rfl: 2 .  fluticasone (FLONASE) 50 MCG/ACT nasal spray, Place 2 sprays into both nostrils daily., Disp: 16 g, Rfl: 6 .  fluticasone furoate-vilanterol (BREO ELLIPTA) 100-25 MCG/INH AEPB, Inhale 1 puff into the lungs daily. (Patient taking differently: Inhale 1 puff into the lungs daily as needed (shortness of breath or wheezing). ), Disp: 1 each, Rfl: 2 .  hydrOXYzine (ATARAX/VISTARIL) 10 MG tablet, Take 1 tablet (10 mg total) by mouth 3 (three) times daily as needed. Take 1 10 mg tablet tid prn anxiety, can take 2 at HS if needed, Disp: 30 tablet, Rfl: 1 .  hydrOXYzine (ATARAX/VISTARIL) 25 MG tablet, Take 50 mg by mouth every 6 (six) hours as needed for anxiety or itching., Disp: , Rfl:  .  montelukast (SINGULAIR) 10 MG tablet, TAKE 1 TABLET BY MOUTH AT BEDTIME, Disp: 30 tablet, Rfl: 0 .  omeprazole (PRILOSEC) 40 MG capsule, TAKE 1 CAPSULE BY MOUTH ONCE DAILY, Disp: 30 capsule, Rfl: 0 .  Spacer/Aero Chamber Mouthpiece MISC, 1 each by Does not apply route every 6 (six) hours as needed., Disp: 1 each, Rfl: 0  Past Medical   Problems: Past Medical History:  Diagnosis Date  . Angio-edema   . Arthritis of both knees   . Asthma   . Breast cancer (HCC)   . Breast disorder    cancer  . Family history of breast cancer   . Family history of kidney cancer   . Family history of lung cancer   . Family history of prostate cancer   . Migraines   . Urticaria     Past Surgical History: Past Surgical History:  Procedure Laterality Date  . BREAST LUMPECTOMY Right 2009  . BREAST REDUCTION SURGERY Bilateral 01/25/2015   Procedure: MAMMARY REDUCTION  (BREAST);  Surgeon: Mary Ann Contogiannis, MD;  Location: [facility];  Service: Plastics;  Laterality: Bilateral;  . CESAREAN SECTION    . ENDOMETRIAL ABLATION    . REDUCTION MAMMAPLASTY Left   . RT lumpectomy      Social History: Social History   Socioeconomic History  . Marital status: Legally Separated    Spouse name: Not on file  . Number of children: 2  . Years of education: Not on file  . Highest education level: Not on file  Occupational History  . Not on file  Social Needs  . Financial resource strain: Not on file  . Food insecurity    Worry: Not on file    Inability: Not on file  . Transportation needs    Medical: Not on file    Non-medical: Not on file  Tobacco Use  . Smoking status: Never Smoker  . Smokeless tobacco: Never Used  Substance and Sexual Activity  . Alcohol use: Yes    Alcohol/week: 0.0 standard drinks    Comment: occ.   . Drug use: No  . Sexual activity: Not Currently    Birth control/protection: None, Surgical  Lifestyle  . Physical activity    Days per week: Not on file    Minutes per session: Not on file  . Stress: Not on file  Relationships  . Social connections    Talks on phone: Not on file    Gets together: Not on file    Attends religious service: Not on file    Active member of club or organization: Not on file    Attends meetings of clubs or organizations: Not on file    Relationship status: Not on file  . Intimate partner violence    Fear of current or ex partner: Not on file    Emotionally abused: Not on file    Physically abused: Not on file    Forced sexual activity: Not on file  Other Topics Concern  . Not on file  Social History Narrative  . Not on file    Family History: Family History  Problem Relation Age of Onset  . Diabetes Father   . Hypertension Father   . Gout Father   . Cancer Paternal Grandmother   . Breast cancer Paternal Grandmother        late 50s  . Emphysema Maternal Grandfather   . Lung cancer Maternal Grandfather        70s  . Prostate  cancer Maternal Uncle        late 50s  . Kidney cancer Other 48       4th degree paternal relative    Review of Systems: Review of Systems  Constitutional: Negative.   HENT: Negative.   Respiratory: Negative.   Cardiovascular: Negative.   Gastrointestinal: Negative.   Genitourinary: Negative.   Musculoskeletal:   Negative.   Skin: Negative.   Neurological: Negative.     Physical Exam: Vital Signs BP 118/86 (BP Location: Left Arm, Patient Position: Sitting, Cuff Size: Large)   Pulse 68   Temp (!) 97.3 F (36.3 C) (Temporal)   Ht 5' (1.524 m)   Wt 259 lb 12.8 oz (117.8 kg)   LMP 12/09/2018 (Exact Date)   SpO2 99%   BMI 50.74 kg/m  Physical Exam Exam conducted with a chaperone present.  Constitutional:      General: She is not in acute distress.    Appearance: Normal appearance. She is not ill-appearing.  HENT:     Head: Normocephalic and atraumatic.  Eyes:     Pupils: Pupils are equal, round Neck:     Musculoskeletal: Normal range of motion.  Cardiovascular:     Rate and Rhythm: Normal rate and regular rhythm.     Pulses: Normal pulses.     Heart sounds: Normal heart sounds. No murmur.  Pulmonary:     Effort: Pulmonary effort is normal. No respiratory distress.     Breath sounds: Normal breath sounds. No wheezing.  Abdominal:     General: Abdomen is flat. There is no distension.     Palpations: Abdomen is soft.     Tenderness: There is no abdominal tenderness.  Musculoskeletal: Normal range of motion.  Skin:    General: Skin is warm and dry.     Findings: No erythema or rash.  Neurological:     General: No focal deficit present.     Mental Status: She is alert and oriented to person, place, and time. Mental status is at baseline.     Motor: No weakness.  Psychiatric:        Mood and Affect: Mood normal.        Behavior: Behavior normal.    Assessment/Plan: Mrs. Turnbaugh is scheduled for bilateral immediate breast reconstruction with placement of  expanders and flex HD by Dr. Dillingham on 12/22/2018 after bilateral mastectomies with sentinel lymph node biopsies by Dr. Doug Blackman.  Risks, benefits, and alternatives of procedure discussed, questions answered and consent obtained.    Patient going for pre-op labs today. Scheduled covid test. She had questions about if her nipple were to be removed or not, I was unable to inform her of the answer as I have not spoke with Dr. Blackman, but will try to find out. She is unsure if she has a pre-op scheduled with him.   The risks that can be encountered with and after placement of a breast expander placement were discussed and include the following but not limited to these: bleeding, infection, delayed healing, anesthesia risks, skin sensation changes, injury to structures including nerves, blood vessels, and muscles which may be temporary or permanent, allergies to tape, suture materials and glues, blood products, topical preparations or injected agents, skin contour irregularities, skin discoloration and swelling, deep vein thrombosis, cardiac and pulmonary complications, pain, which may persist, fluid accumulation, wrinkling of the skin over the expander, changes in nipple or breast sensation, expander leakage or rupture, faulty position of the expander, persistent pain, formation of tight scar tissue around the expander (capsular contracture), possible need for revisional surgery or staged procedures.    Electronically signed by: Celese Banner J Monaca Wadas, PA-C 12/16/2018 9:51 AM  

## 2018-12-16 NOTE — Progress Notes (Signed)
PCP - [facility], also [facility] Cardiologist - none  Chest x-ray - n/a EKG - n/a Stress Test - n/a ECHO - n/a Cardiac Cath - n/a  Sleep Study - n/a CPAP - n/a Fasting Blood Sugar - n/a Checks Blood Sugar _____ times a day  Blood Thinner Instructions:n/a Aspirin Instructions:n/a Anesthesia review:   Patient denies shortness of breath, fever, cough and chest pain at PAT appointment   Patient verbalized understanding of instructions that were given to them at the PAT appointment. Patient was also instructed that they will need to review over the PAT instructions again at home before surgery.

## 2018-12-17 ENCOUNTER — Encounter: Payer: Self-pay | Admitting: Student

## 2018-12-17 ENCOUNTER — Other Ambulatory Visit (HOSPITAL_COMMUNITY)
Admission: RE | Admit: 2018-12-17 | Discharge: 2018-12-17 | Disposition: A | Discharge status: Home / Self Care | Payer: BC Managed Care – PPO | Source: Ambulatory Visit | Attending: Student | Admitting: Student

## 2018-12-17 ENCOUNTER — Ambulatory Visit: Payer: BC Managed Care – PPO | Admitting: Student

## 2018-12-17 VITALS — BP 122/78 | HR 113 | Wt 258.0 lb

## 2018-12-17 DIAGNOSIS — N898 Other specified noninflammatory disorders of vagina: Secondary | ICD-10-CM

## 2018-12-17 NOTE — Progress Notes (Signed)
History:  [name] is a 38 y.o. [id number] who presents to clinic today for concerns about vaginal spotting and concern about her vaginal discharge. The spotting lasts only a few days.   She says that she has infrequent periods that are spotting that is dark brown; no clots. Light cramping. The spotting is irregular.   She says that she has frequent white discharge, no odor, no itching, no irritation. She thinks that these are frequent yeast and BV infections. Derrek Monaco prescribed that she take Diflucan after her period monthly, which she has been doing.   Patient has also had breast cancer twice; is scheduled for mastectomy on 12-22-2018.   The following portions of the patient's history were reviewed and updated as appropriate: allergies, current medications, family history, past medical history, social history, past surgical history and problem list.  Review of Systems:  Review of Systems  Constitutional: Negative.   HENT: Negative.   Respiratory: Negative.   Cardiovascular: Negative.   Gastrointestinal: Negative.   Skin: Negative.   Neurological: Negative.       Objective:  Physical Exam BP 122/78 (BP Location: Left Arm, Patient Position: Sitting, Cuff Size: Large)   Pulse (!) 113   Wt 258 lb (117 kg)   LMP 12/09/2018 (Exact Date)   BMI 50.39 kg/m  Physical Exam  Constitutional: She is oriented to person, place, and time. She appears well-developed.  HENT:  Head: Normocephalic.  Respiratory: Effort normal.  GI: Soft.  Genitourinary:    Vagina normal.     Genitourinary Comments: NEFG; no discharge or blood in the vagina. No CMT, suprapubic or adnexal tenderness. Cervix is pink with no lesions.    Musculoskeletal: Normal range of motion.  Neurological: She is alert and oriented to person, place, and time.      Labs and Imaging No results found for this or any previous visit (from the past 24 hour(s)).  No results found.   Assessment &  Plan:  1. Vaginal discharge -explained to patient that her findings sound normal, and that she does not need to worry about discharge that is odorless and non-itching/painful. Reassured her that discharge changes throughout her cycle.  -Patient cannot take hormonal methods of birth control, is unsure what method to use but does not want any methods today. I suggested paraguard IUD; she will think about it.  - Cytology - PAP - NuSwab Vaginitis Plus (VG+) -She would like to return in 2 months to see MD after her surgery to talk about her abnormal uterine bleeding and possible IUD placement.   Starr Lake, CNM 12/18/2018 9:15 AM

## 2018-12-17 NOTE — Patient Instructions (Signed)
If you have any of these symptoms, call the clinic for a self swab: burning or pain with urination, bad odor after intercourse, itching, clumpy white or yellow discharge.  Keep taking the Diflucan monthly as prescribed by Anderson Malta   Follow these instructions at home: Lifestyle  Keep your genital area clean and dry. Avoid soap, and only rinse the area with water.  Do not douche or use tampons until your health care provider says it is okay to do so. Use sanitary pads, if needed.  Do not have sex until your health care provider approves. When you can return to sex, practice safe sex and use condoms.  Wipe from front to back. This avoids the spread of bacteria from the rectum to the vagina. General instructions  Take over-the-counter and prescription medicines only as told by your health care provider.  If you were prescribed an antibiotic medicine, take or use it as told by your health care provider. Do not stop taking or using the antibiotic even if you start to feel better.  Keep all follow-up visits as told by your health care provider. This is important. How is this prevented?  Use mild, non-scented products. Do not use things that can irritate the vagina, such as fabric softeners. Avoid the following products if they are scented: ? Feminine sprays. ? Detergents. ? Tampons. ? Feminine hygiene products. ? Soaps or bubble baths.  Let air reach your genital area. ? Wear cotton underwear to reduce moisture buildup. ? Avoid wearing underwear while you sleep. ? Avoid wearing tight pants and underwear or nylons without a cotton panel. ? Avoid wearing thong underwear.  Take off any wet clothing, such as bathing suits, as soon as possible.  Practice safe sex and use condoms. Contact a health care provider if:  You have abdominal pain.  You have a fever.  You have symptoms that last for more than 2-3 days. Get help right away if:  You have a fever and your symptoms suddenly get  worse. Summary  Vaginitis is a condition in which the vaginal tissue becomes inflamed.This condition is most often caused by a change in the normal balance of bacteria and yeast that live in the vagina.  Treatment varies depending on the type of vaginitis you have.  Do not douche, use tampons , or have sex until your health care provider approves. When you can return to sex, practice safe sex and use condoms. This information is not intended to replace advice given to you by your health care provider. Make sure you discuss any questions you have with your health care provider. Document Released: 02/04/2007 Document Revised: 03/22/2017 Document Reviewed: 05/15/2016 Elsevier Patient Education  2020 [employer].

## 2018-12-18 ENCOUNTER — Inpatient Hospital Stay (HOSPITAL_COMMUNITY): Admission: RE | Admit: 2018-12-18 | Payer: BC Managed Care – PPO | Source: Ambulatory Visit

## 2018-12-18 ENCOUNTER — Other Ambulatory Visit (HOSPITAL_COMMUNITY)
Admission: RE | Admit: 2018-12-18 | Discharge: 2018-12-18 | Disposition: A | Discharge status: Home / Self Care | Payer: BC Managed Care – PPO | Source: Ambulatory Visit | Attending: Surgery | Admitting: Surgery

## 2018-12-18 ENCOUNTER — Other Ambulatory Visit: Payer: Self-pay | Admitting: Surgery

## 2018-12-18 DIAGNOSIS — Z20828 Contact with and (suspected) exposure to other viral communicable diseases: Secondary | ICD-10-CM | POA: Diagnosis not present

## 2018-12-18 DIAGNOSIS — Z01812 Encounter for preprocedural laboratory examination: Secondary | ICD-10-CM | POA: Insufficient documentation

## 2018-12-18 DIAGNOSIS — C50911 Malignant neoplasm of unspecified site of right female breast: Secondary | ICD-10-CM | POA: Diagnosis not present

## 2018-12-18 DIAGNOSIS — N898 Other specified noninflammatory disorders of vagina: Secondary | ICD-10-CM | POA: Diagnosis not present

## 2018-12-18 LAB — SARS CORONAVIRUS 2 (TAT 6-24 HRS): SARS Coronavirus 2: NEGATIVE

## 2018-12-19 MED ORDER — DEXTROSE 5 % IV SOLN
3.0000 g | INTRAVENOUS | Status: DC
  Filled 2018-12-19 (×2): qty 3000

## 2018-12-21 NOTE — H&P (Signed)
Adriana Moon  Location: [facility] Patient #: [id number] DOB: 1980/06/21 Divorced / Language: English / Race: Black or African American Female   History of Present Illness  The patient is a 38 year old female who presents with breast cancer. This is a 38 year old female referred by Derrek Monaco NP after recent diagnosis of a right breast cancer. She has a history of having had breast cancer in the right breast 11 years ago. In February 2009 she had undergone a lumpectomy and sentinel node biopsy of the right breast for invasive cancer. She received postoperative radiation. She only took tamoxifen for a short time. She had also been on another subcutaneous injection. In 2016, she underwent bilateral reduction mammoplasty. 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 Most recently, she felt a nodule in her right breast and presented for mammogram and ultrasound which showed 4 separate nodules in the right breast. 2 were biopsied. One showed fat necrosis. 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 She had genetic testing back in 2009 which was negative by her report. She is otherwise without complaints. Her family history was negative for breast cancer   Past Surgical History  Breast Augmentation  Bilateral. Breast Biopsy  Right. Breast Mass; Local Excision  Right. Breast Reconstruction  Right. Cesarean Section - Multiple  Oral Surgery   Diagnostic Studies History Colonoscopy  never Mammogram  within last year Pap Smear  1-5 years ago  Allergies ) NSAIDs  Fish  shell Allergies Reconciled   Medication History  No Current Medications Medications Reconciled  Social History  Alcohol use  Occasional alcohol use. Caffeine use  Coffee, Tea. No drug use  Tobacco use  Never smoker.  Family History Diabetes Mellitus   Father. Hypertension  Father.  Pregnancy / Birth History  Age at menarche  10 years. Contraceptive History  Depo-provera, Intrauterine device. Gravida  3 Length (months) of breastfeeding  3-6 Maternal age  28-20 Para  2 Regular periods   Other Problems Anxiety Disorder  Asthma  Breast Cancer  Gastroesophageal Reflux Disease  Migraine Headache     Review of Systems  General Present- Weight Loss. Not Present- Appetite Loss, Chills, Fatigue, Fever, Night Sweats and Weight Gain. Skin Present- Change in Wart/Mole. Not Present- Dryness, Hives, Jaundice, New Lesions, Non-Healing Wounds, Rash and Ulcer. HEENT Present- Wears glasses/contact lenses. Not Present- Earache, Hearing Loss, Hoarseness, Nose Bleed, Oral Ulcers, Ringing in the Ears, Seasonal Allergies, Sinus Pain, Sore Throat, Visual Disturbances and Yellow Eyes. Respiratory Not Present- Bloody sputum, Chronic Cough, Difficulty Breathing, Snoring and Wheezing. Cardiovascular Not Present- Chest Pain, Difficulty Breathing Lying Down, Leg Cramps, Palpitations, Rapid Heart Rate, Shortness of Breath and Swelling of Extremities. Gastrointestinal Not Present- Abdominal Pain, Bloating, Bloody Stool, Change in Bowel Habits, Chronic diarrhea, Constipation, Difficulty Swallowing, Excessive gas, Gets full quickly at meals, Hemorrhoids, Indigestion, Nausea, Rectal Pain and Vomiting. Female Genitourinary Not Present- Frequency, Nocturia, Painful Urination, Pelvic Pain and Urgency. Musculoskeletal Not Present- Back Pain, Joint Pain, Joint Stiffness, Muscle Pain, Muscle Weakness and Swelling of Extremities. Neurological Not Present- Decreased Memory, Fainting, Headaches, Numbness, Seizures, Tingling, Tremor, Trouble walking and Weakness. Psychiatric Present- Anxiety and Frequent crying. Not Present- Bipolar, Change in Sleep Pattern, Depression and Fearful. Endocrine Not Present- Cold Intolerance, Excessive Hunger, Hair Changes, Heat  Intolerance, Hot flashes and New Diabetes. Hematology Not Present- Blood Thinners, Easy Bruising, Excessive bleeding, Gland problems, HIV and Persistent  Infections.  Vitals  Weight: 235.4 lb Height: 60in Body Surface Area: 2 m Body Mass Index: 45.97 kg/m  Temp.: 72F (Oral)  Pulse: 96 (Regular)  BP: 134/78(Sitting, Left Arm, Standard)    Physical Exam  General Mental Status-Alert. General Appearance-Consistent with stated age. Hydration-Well hydrated. Voice-Normal.  Head and Neck Head-normocephalic, atraumatic with no lesions or palpable masses. Trachea-midline. Thyroid Gland Characteristics - normal size and consistency.  Eye Eyeball - Bilateral-Extraocular movements intact. Sclera/Conjunctiva - Bilateral-No scleral icterus.  Chest and Lung Exam Chest and lung exam reveals -quiet, even and easy respiratory effort with no use of accessory muscles and on auscultation, normal breath sounds, no adventitious sounds and normal vocal resonance. Inspection Chest Wall - Normal. Back - normal.  Breast Breast - Left-Symmetric, Non Tender, No Biopsy scars, no Dimpling - Left, No Inflammation, No Lumpectomy scars, No Mastectomy scars, No Peau d' Orange. Breast - Right-Symmetric, Non Tender, No Biopsy scars, no Dimpling - Right, No Inflammation, No Lumpectomy scars, No Mastectomy scars, No Peau d' Orange. Breast Lump-No Palpable Breast Mass. Note: There are scars on both breasts from her lateral reduction. I can only feel one small nodule at the 2 o'clock position of the right breast. There are no left breast masses   Cardiovascular Cardiovascular examination reveals -normal heart sounds, regular rate and rhythm with no murmurs and normal pedal pulses bilaterally.  Abdomen - Did not examine.  Neurologic - Did not examine.  Musculoskeletal - Did not examine.  Lymphatic Head & Neck  General Head & Neck Lymphatics: Bilateral - Description -  Normal. Axillary  General Axillary Region: Bilateral - Description - Normal. Tenderness - Non Tender. Note: There is one palpable lymph node in her left axilla which is mobile. There is no adenopathy in the right axilla and right axillary incision is well-healed Femoral & Inguinal - Did not examine.    Assessment & Plan (  BREAST CANCER, RIGHT (C50.911) Impression: I have reviewed the patient's pathology results. I also reviewed her mammograms and ultrasounds. I gave the patient a copy of the pathology results. We have discussed her diagnosis in detail. We will refer her to medical and radiation oncology as well as reevaluation by geneticist. She will also be presented at our cancer conference. A bilateral breast MRI is recommended to completely stage her disease given her initial diagnosis of breast cancer at age 50. We briefly discussed mastectomies and potential reconstruction if this is needed. I will call her back after the conference as well as her MRIs are complete.  Addendum:  She has now had breast MRI, biopsy of a negative area in the left breast, has been seen by medical and radiation oncology as well as plastic surgery. The plan is to proceed with a right breast mastectomy and sentinel node biopsy and a left mastectomy including excision on the nipple/areolar complex bilaterally and placement of tissue expanders. We again discussed the risks of bleeding, infection, injury to surrounding structures, cardiopulmonary issues, DVT, post op drains, etc. She agrees to proceed

## 2018-12-22 ENCOUNTER — Ambulatory Visit (HOSPITAL_COMMUNITY): Payer: BC Managed Care – PPO | Admitting: Certified Registered Nurse Anesthetist

## 2018-12-22 ENCOUNTER — Encounter (HOSPITAL_COMMUNITY)
Admission: AD | Disposition: A | Discharge status: Home / Self Care | Payer: Self-pay | Source: Home / Self Care | Attending: Plastic Surgery

## 2018-12-22 ENCOUNTER — Other Ambulatory Visit: Payer: Self-pay

## 2018-12-22 ENCOUNTER — Encounter (HOSPITAL_COMMUNITY): Payer: Self-pay | Admitting: Certified Registered Nurse Anesthetist

## 2018-12-22 ENCOUNTER — Inpatient Hospital Stay (HOSPITAL_COMMUNITY)
Admission: AD | Admit: 2018-12-22 | Discharge: 2018-12-24 | DRG: 581 | Disposition: A | Discharge status: Home / Self Care | Payer: BC Managed Care – PPO | Attending: Plastic Surgery | Admitting: Plastic Surgery

## 2018-12-22 ENCOUNTER — Encounter (HOSPITAL_COMMUNITY)
Admission: RE | Admit: 2018-12-22 | Discharge: 2018-12-22 | Disposition: A | Discharge status: Home / Self Care | Payer: BC Managed Care – PPO | Source: Ambulatory Visit | Attending: Surgery | Admitting: Surgery

## 2018-12-22 DIAGNOSIS — Z853 Personal history of malignant neoplasm of breast: Secondary | ICD-10-CM | POA: Insufficient documentation

## 2018-12-22 DIAGNOSIS — C50511 Malignant neoplasm of lower-outer quadrant of right female breast: Secondary | ICD-10-CM | POA: Diagnosis not present

## 2018-12-22 DIAGNOSIS — Z801 Family history of malignant neoplasm of trachea, bronchus and lung: Secondary | ICD-10-CM

## 2018-12-22 DIAGNOSIS — K219 Gastro-esophageal reflux disease without esophagitis: Secondary | ICD-10-CM | POA: Diagnosis present

## 2018-12-22 DIAGNOSIS — C50411 Malignant neoplasm of upper-outer quadrant of right female breast: Principal | ICD-10-CM | POA: Diagnosis present

## 2018-12-22 DIAGNOSIS — Z803 Family history of malignant neoplasm of breast: Secondary | ICD-10-CM

## 2018-12-22 DIAGNOSIS — Z7951 Long term (current) use of inhaled steroids: Secondary | ICD-10-CM | POA: Diagnosis not present

## 2018-12-22 DIAGNOSIS — Z923 Personal history of irradiation: Secondary | ICD-10-CM | POA: Diagnosis not present

## 2018-12-22 DIAGNOSIS — Z20828 Contact with and (suspected) exposure to other viral communicable diseases: Secondary | ICD-10-CM | POA: Diagnosis not present

## 2018-12-22 DIAGNOSIS — N189 Chronic kidney disease, unspecified: Secondary | ICD-10-CM | POA: Diagnosis present

## 2018-12-22 DIAGNOSIS — Z8249 Family history of ischemic heart disease and other diseases of the circulatory system: Secondary | ICD-10-CM | POA: Diagnosis not present

## 2018-12-22 DIAGNOSIS — J45909 Unspecified asthma, uncomplicated: Secondary | ICD-10-CM | POA: Diagnosis not present

## 2018-12-22 DIAGNOSIS — Z825 Family history of asthma and other chronic lower respiratory diseases: Secondary | ICD-10-CM | POA: Diagnosis not present

## 2018-12-22 DIAGNOSIS — Z79899 Other long term (current) drug therapy: Secondary | ICD-10-CM | POA: Diagnosis not present

## 2018-12-22 DIAGNOSIS — G43909 Migraine, unspecified, not intractable, without status migrainosus: Secondary | ICD-10-CM | POA: Diagnosis not present

## 2018-12-22 DIAGNOSIS — F418 Other specified anxiety disorders: Secondary | ICD-10-CM | POA: Diagnosis not present

## 2018-12-22 DIAGNOSIS — Z833 Family history of diabetes mellitus: Secondary | ICD-10-CM | POA: Diagnosis not present

## 2018-12-22 DIAGNOSIS — C50911 Malignant neoplasm of unspecified site of right female breast: Secondary | ICD-10-CM | POA: Diagnosis not present

## 2018-12-22 DIAGNOSIS — N6012 Diffuse cystic mastopathy of left breast: Secondary | ICD-10-CM | POA: Diagnosis not present

## 2018-12-22 DIAGNOSIS — G8918 Other acute postprocedural pain: Secondary | ICD-10-CM | POA: Diagnosis not present

## 2018-12-22 DIAGNOSIS — C50919 Malignant neoplasm of unspecified site of unspecified female breast: Secondary | ICD-10-CM | POA: Diagnosis present

## 2018-12-22 HISTORY — DX: Gastro-esophageal reflux disease without esophagitis: K21.9

## 2018-12-22 HISTORY — PX: BREAST RECONSTRUCTION WITH PLACEMENT OF TISSUE EXPANDER AND FLEX HD (ACELLULAR HYDRATED DERMIS): SHX6295

## 2018-12-22 HISTORY — DX: Depression, unspecified: F32.A

## 2018-12-22 HISTORY — PX: MASTECTOMY W/ SENTINEL NODE BIOPSY: SHX2001

## 2018-12-22 HISTORY — DX: Anxiety disorder, unspecified: F41.9

## 2018-12-22 LAB — CYTOLOGY - PAP
Diagnosis: NEGATIVE
HPV: NOT DETECTED

## 2018-12-22 LAB — NUSWAB VAGINITIS PLUS (VG+)
Candida albicans, NAA: NEGATIVE
Candida glabrata, NAA: NEGATIVE
Chlamydia trachomatis, NAA: NEGATIVE
Neisseria gonorrhoeae, NAA: NEGATIVE
Trich vag by NAA: NEGATIVE

## 2018-12-22 LAB — POCT PREGNANCY, URINE: Preg Test, Ur: NEGATIVE

## 2018-12-22 SURGERY — MASTECTOMY WITH SENTINEL LYMPH NODE BIOPSY
Anesthesia: General | Site: Breast | Laterality: Bilateral

## 2018-12-22 MED ORDER — BISACODYL 10 MG RE SUPP: 10.0000 mg | Freq: Every day | RECTAL | Status: DC | PRN

## 2018-12-22 MED ORDER — FENTANYL CITRATE (PF) 100 MCG/2ML IJ SOLN
INTRAMUSCULAR | Status: DC | PRN
  Administered 2018-12-22 (×3): 50 ug via INTRAVENOUS
  Administered 2018-12-22: 25 ug via INTRAVENOUS
  Administered 2018-12-22: 50 ug via INTRAVENOUS
  Administered 2018-12-22: 25 ug via INTRAVENOUS

## 2018-12-22 MED ORDER — ACETAMINOPHEN 500 MG PO TABS
1000.0000 mg | ORAL_TABLET | ORAL | Status: AC
  Administered 2018-12-22: 09:00:00 1000 mg via ORAL
  Filled 2018-12-22: qty 2

## 2018-12-22 MED ORDER — SODIUM CHLORIDE 0.9 % IV SOLN
INTRAVENOUS | Status: DC | PRN
  Administered 2018-12-22: 500 mL

## 2018-12-22 MED ORDER — ONDANSETRON 4 MG PO TBDP: 4.0000 mg | ORAL_TABLET | Freq: Four times a day (QID) | ORAL | Status: DC | PRN

## 2018-12-22 MED ORDER — LIDOCAINE 2% (20 MG/ML) 5 ML SYRINGE
INTRAMUSCULAR | Status: AC
  Filled 2018-12-22: qty 5

## 2018-12-22 MED ORDER — DEXAMETHASONE SODIUM PHOSPHATE 10 MG/ML IJ SOLN
INTRAMUSCULAR | Status: DC | PRN
  Administered 2018-12-22: 10 mg via INTRAVENOUS

## 2018-12-22 MED ORDER — POLYETHYLENE GLYCOL 3350 17 G PO PACK: 17.0000 g | PACK | Freq: Every day | ORAL | Status: DC | PRN

## 2018-12-22 MED ORDER — SODIUM CHLORIDE (PF) 0.9 % IJ SOLN
INTRAVENOUS | Status: DC | PRN
  Administered 2018-12-22: 5 mL via INTRAMUSCULAR

## 2018-12-22 MED ORDER — METHYLENE BLUE 0.5 % INJ SOLN
INTRAVENOUS | Status: AC
  Filled 2018-12-22: qty 10

## 2018-12-22 MED ORDER — BUPIVACAINE HCL (PF) 0.25 % IJ SOLN
INTRAMUSCULAR | Status: DC | PRN
  Administered 2018-12-22: 55 mL via EPIDURAL

## 2018-12-22 MED ORDER — CEFAZOLIN SODIUM-DEXTROSE 2-4 GM/100ML-% IV SOLN
2.0000 g | INTRAVENOUS | Status: AC
  Administered 2018-12-22: 2 g via INTRAVENOUS
  Filled 2018-12-22: qty 100

## 2018-12-22 MED ORDER — DIPHENHYDRAMINE HCL 50 MG/ML IJ SOLN: 12.5000 mg | Freq: Four times a day (QID) | INTRAMUSCULAR | Status: DC | PRN

## 2018-12-22 MED ORDER — MIDAZOLAM HCL 2 MG/2ML IJ SOLN
INTRAMUSCULAR | Status: AC
  Administered 2018-12-22: 2 mg via INTRAVENOUS
  Filled 2018-12-22: qty 2

## 2018-12-22 MED ORDER — OXYCODONE HCL 5 MG PO TABS
5.0000 mg | ORAL_TABLET | Freq: Once | ORAL | Status: AC | PRN
  Administered 2018-12-22: 5 mg via ORAL

## 2018-12-22 MED ORDER — ROCURONIUM BROMIDE 50 MG/5ML IV SOSY
PREFILLED_SYRINGE | INTRAVENOUS | Status: DC | PRN
  Administered 2018-12-22: 50 mg via INTRAVENOUS
  Administered 2018-12-22: 20 mg via INTRAVENOUS

## 2018-12-22 MED ORDER — ONDANSETRON HCL 4 MG/2ML IJ SOLN
INTRAMUSCULAR | Status: AC
  Filled 2018-12-22: qty 2

## 2018-12-22 MED ORDER — FENTANYL CITRATE (PF) 250 MCG/5ML IJ SOLN
INTRAMUSCULAR | Status: AC
  Filled 2018-12-22: qty 5

## 2018-12-22 MED ORDER — SUGAMMADEX SODIUM 500 MG/5ML IV SOLN
INTRAVENOUS | Status: AC
  Filled 2018-12-22: qty 5

## 2018-12-22 MED ORDER — ONDANSETRON HCL 4 MG/2ML IJ SOLN
4.0000 mg | Freq: Four times a day (QID) | INTRAMUSCULAR | Status: DC | PRN
  Administered 2018-12-22 – 2018-12-23 (×2): 4 mg via INTRAVENOUS
  Filled 2018-12-22 (×2): qty 2

## 2018-12-22 MED ORDER — PHENYLEPHRINE HCL (PRESSORS) 10 MG/ML IV SOLN
INTRAVENOUS | Status: DC | PRN
  Administered 2018-12-22 (×3): 80 ug via INTRAVENOUS

## 2018-12-22 MED ORDER — PROPOFOL 10 MG/ML IV BOLUS
INTRAVENOUS | Status: DC | PRN
  Administered 2018-12-22: 200 mg via INTRAVENOUS

## 2018-12-22 MED ORDER — HYDROMORPHONE HCL 1 MG/ML IJ SOLN
INTRAMUSCULAR | Status: AC
  Filled 2018-12-22: qty 1

## 2018-12-22 MED ORDER — CHLORHEXIDINE GLUCONATE CLOTH 2 % EX PADS: 6.0000 | MEDICATED_PAD | Freq: Once | CUTANEOUS | Status: DC

## 2018-12-22 MED ORDER — SUCCINYLCHOLINE CHLORIDE 200 MG/10ML IV SOSY
PREFILLED_SYRINGE | INTRAVENOUS | Status: AC
  Filled 2018-12-22: qty 10

## 2018-12-22 MED ORDER — OXYCODONE HCL 5 MG/5ML PO SOLN: 5.0000 mg | Freq: Once | ORAL | Status: AC | PRN

## 2018-12-22 MED ORDER — PROPOFOL 10 MG/ML IV BOLUS
INTRAVENOUS | Status: AC
  Filled 2018-12-22: qty 20

## 2018-12-22 MED ORDER — PROMETHAZINE HCL 25 MG/ML IJ SOLN
6.2500 mg | INTRAMUSCULAR | Status: DC | PRN
  Administered 2018-12-22: 13:00:00 6.25 mg via INTRAVENOUS

## 2018-12-22 MED ORDER — SUCCINYLCHOLINE CHLORIDE 200 MG/10ML IV SOSY
PREFILLED_SYRINGE | INTRAVENOUS | Status: DC | PRN
  Administered 2018-12-22: 120 mg via INTRAVENOUS

## 2018-12-22 MED ORDER — SENNA 8.6 MG PO TABS
1.0000 | ORAL_TABLET | Freq: Two times a day (BID) | ORAL | Status: DC
  Administered 2018-12-22 – 2018-12-24 (×5): 8.6 mg via ORAL
  Filled 2018-12-22 (×5): qty 1

## 2018-12-22 MED ORDER — ROCURONIUM BROMIDE 10 MG/ML (PF) SYRINGE
PREFILLED_SYRINGE | INTRAVENOUS | Status: AC
  Filled 2018-12-22: qty 10

## 2018-12-22 MED ORDER — SODIUM CHLORIDE 0.9 % IV SOLN
INTRAVENOUS | Status: AC
  Filled 2018-12-22: qty 500000

## 2018-12-22 MED ORDER — MIDAZOLAM HCL 2 MG/2ML IJ SOLN
2.0000 mg | Freq: Once | INTRAMUSCULAR | Status: AC
  Administered 2018-12-22: 09:00:00 2 mg via INTRAVENOUS

## 2018-12-22 MED ORDER — CEFAZOLIN SODIUM-DEXTROSE 2-4 GM/100ML-% IV SOLN
2.0000 g | Freq: Three times a day (TID) | INTRAVENOUS | Status: DC
  Administered 2018-12-22 – 2018-12-24 (×5): 2 g via INTRAVENOUS
  Filled 2018-12-22 (×7): qty 100

## 2018-12-22 MED ORDER — ONDANSETRON HCL 4 MG/2ML IJ SOLN
INTRAMUSCULAR | Status: DC | PRN
  Administered 2018-12-22 (×2): 4 mg via INTRAVENOUS

## 2018-12-22 MED ORDER — HYDROMORPHONE HCL 1 MG/ML IJ SOLN
0.2500 mg | INTRAMUSCULAR | Status: DC | PRN
  Administered 2018-12-22: 0.25 mg via INTRAVENOUS
  Administered 2018-12-22: 0.5 mg via INTRAVENOUS
  Administered 2018-12-22: 0.25 mg via INTRAVENOUS

## 2018-12-22 MED ORDER — DEXAMETHASONE SODIUM PHOSPHATE 10 MG/ML IJ SOLN
INTRAMUSCULAR | Status: AC
  Filled 2018-12-22: qty 1

## 2018-12-22 MED ORDER — ACETAMINOPHEN 325 MG PO TABS
325.0000 mg | ORAL_TABLET | Freq: Four times a day (QID) | ORAL | Status: DC
  Administered 2018-12-22 – 2018-12-24 (×7): 325 mg via ORAL
  Filled 2018-12-22 (×7): qty 1

## 2018-12-22 MED ORDER — KCL IN DEXTROSE-NACL 20-5-0.45 MEQ/L-%-% IV SOLN
INTRAVENOUS | Status: DC
  Administered 2018-12-22 – 2018-12-23 (×4): via INTRAVENOUS
  Filled 2018-12-22 (×5): qty 1000

## 2018-12-22 MED ORDER — TECHNETIUM TC 99M SULFUR COLLOID FILTERED
1.0000 | Freq: Once | INTRAVENOUS | Status: AC | PRN
  Administered 2018-12-22: 1 via INTRADERMAL

## 2018-12-22 MED ORDER — MIDAZOLAM HCL 2 MG/2ML IJ SOLN
INTRAMUSCULAR | Status: AC
  Filled 2018-12-22: qty 2

## 2018-12-22 MED ORDER — OXYCODONE HCL 5 MG PO TABS
5.0000 mg | ORAL_TABLET | ORAL | Status: DC | PRN
  Administered 2018-12-22 – 2018-12-23 (×3): 10 mg via ORAL
  Filled 2018-12-22 (×3): qty 2

## 2018-12-22 MED ORDER — OXYCODONE HCL 5 MG PO TABS
ORAL_TABLET | ORAL | Status: AC
  Filled 2018-12-22: qty 1

## 2018-12-22 MED ORDER — PHENYLEPHRINE 40 MCG/ML (10ML) SYRINGE FOR IV PUSH (FOR BLOOD PRESSURE SUPPORT)
PREFILLED_SYRINGE | INTRAVENOUS | Status: AC
  Filled 2018-12-22: qty 10

## 2018-12-22 MED ORDER — SUGAMMADEX SODIUM 200 MG/2ML IV SOLN
INTRAVENOUS | Status: DC | PRN
  Administered 2018-12-22: 250 mg via INTRAVENOUS

## 2018-12-22 MED ORDER — LACTATED RINGERS IV SOLN
INTRAVENOUS | Status: DC
  Administered 2018-12-22 (×2): via INTRAVENOUS

## 2018-12-22 MED ORDER — GABAPENTIN 300 MG PO CAPS
300.0000 mg | ORAL_CAPSULE | ORAL | Status: AC
  Administered 2018-12-22: 300 mg via ORAL
  Filled 2018-12-22: qty 1

## 2018-12-22 MED ORDER — 0.9 % SODIUM CHLORIDE (POUR BTL) OPTIME
TOPICAL | Status: DC | PRN
  Administered 2018-12-22: 2000 mL

## 2018-12-22 MED ORDER — PROMETHAZINE HCL 25 MG/ML IJ SOLN
INTRAMUSCULAR | Status: AC
  Filled 2018-12-22: qty 1

## 2018-12-22 MED ORDER — HYDROMORPHONE HCL 1 MG/ML IJ SOLN
1.0000 mg | INTRAMUSCULAR | Status: DC | PRN
  Administered 2018-12-22 – 2018-12-23 (×3): 1 mg via INTRAVENOUS
  Filled 2018-12-22 (×3): qty 1

## 2018-12-22 MED ORDER — KETOROLAC TROMETHAMINE 30 MG/ML IJ SOLN
INTRAMUSCULAR | Status: AC
  Filled 2018-12-22: qty 1

## 2018-12-22 MED ORDER — DIPHENHYDRAMINE HCL 12.5 MG/5ML PO ELIX: 12.5000 mg | ORAL_SOLUTION | Freq: Four times a day (QID) | ORAL | Status: DC | PRN

## 2018-12-22 MED ORDER — KETOROLAC TROMETHAMINE 30 MG/ML IJ SOLN: 30.0000 mg | Freq: Once | INTRAMUSCULAR | Status: DC

## 2018-12-22 MED ORDER — LIDOCAINE 2% (20 MG/ML) 5 ML SYRINGE
INTRAMUSCULAR | Status: DC | PRN
  Administered 2018-12-22: 60 mg via INTRAVENOUS

## 2018-12-22 MED ORDER — FENTANYL CITRATE (PF) 100 MCG/2ML IJ SOLN
100.0000 ug | Freq: Once | INTRAMUSCULAR | Status: AC
  Administered 2018-12-22: 09:00:00 100 ug via INTRAVENOUS

## 2018-12-22 MED ORDER — FENTANYL CITRATE (PF) 100 MCG/2ML IJ SOLN
INTRAMUSCULAR | Status: AC
  Administered 2018-12-22: 100 ug via INTRAVENOUS
  Filled 2018-12-22: qty 2

## 2018-12-22 SURGICAL SUPPLY — 77 items
APPLIER CLIP 9.375 MED OPEN (MISCELLANEOUS) ×3
BAG DECANTER FOR FLEXI CONT (MISCELLANEOUS) ×3 IMPLANT
BINDER BREAST LRG (GAUZE/BANDAGES/DRESSINGS) IMPLANT
BINDER BREAST XLRG (GAUZE/BANDAGES/DRESSINGS) IMPLANT
BINDER BREAST XXLRG (GAUZE/BANDAGES/DRESSINGS) ×1 IMPLANT
BIOPATCH RED 1 DISK 7.0 (GAUZE/BANDAGES/DRESSINGS) ×9 IMPLANT
CANISTER SUCT 3000ML PPV (MISCELLANEOUS) ×5 IMPLANT
CHLORAPREP W/TINT 26 (MISCELLANEOUS) ×7 IMPLANT
CLIP APPLIE 9.375 MED OPEN (MISCELLANEOUS) ×2 IMPLANT
CONT SPEC 4OZ CLIKSEAL STRL BL (MISCELLANEOUS) ×4 IMPLANT
COVER BACK TABLE 60X90IN (DRAPES) ×1 IMPLANT
COVER PROBE W GEL 5X96 (DRAPES) ×3 IMPLANT
COVER SURGICAL LIGHT HANDLE (MISCELLANEOUS) ×6 IMPLANT
COVER WAND RF STERILE (DRAPES) ×5 IMPLANT
DERMABOND ADHESIVE PROPEN (GAUZE/BANDAGES/DRESSINGS) ×1
DERMABOND ADVANCED (GAUZE/BANDAGES/DRESSINGS) ×2
DERMABOND ADVANCED .7 DNX12 (GAUZE/BANDAGES/DRESSINGS) ×4 IMPLANT
DERMABOND ADVANCED .7 DNX6 (GAUZE/BANDAGES/DRESSINGS) IMPLANT
DRAIN CHANNEL 19F RND (DRAIN) ×7 IMPLANT
DRAPE CHEST BREAST 15X10 FENES (DRAPES) ×3 IMPLANT
DRAPE HALF SHEET 40X57 (DRAPES) ×3 IMPLANT
DRAPE ORTHO SPLIT 77X108 STRL (DRAPES) ×2
DRAPE SURG 17X23 STRL (DRAPES) ×12 IMPLANT
DRAPE SURG ORHT 6 SPLT 77X108 (DRAPES) ×4 IMPLANT
DRAPE WARM FLUID 44X44 (DRAPES) ×3 IMPLANT
DRSG PAD ABDOMINAL 8X10 ST (GAUZE/BANDAGES/DRESSINGS) ×12 IMPLANT
DRSG TEGADERM 4X4.75 (GAUZE/BANDAGES/DRESSINGS) ×3 IMPLANT
ELECT BLADE 4.0 EZ CLEAN MEGAD (MISCELLANEOUS)
ELECT REM PT RETURN 9FT ADLT (ELECTROSURGICAL) ×6
ELECTRODE BLDE 4.0 EZ CLN MEGD (MISCELLANEOUS) IMPLANT
ELECTRODE REM PT RTRN 9FT ADLT (ELECTROSURGICAL) ×4 IMPLANT
EVACUATOR SILICONE 100CC (DRAIN) ×6 IMPLANT
GAUZE SPONGE 4X4 12PLY STRL (GAUZE/BANDAGES/DRESSINGS) ×6 IMPLANT
GLOVE BIO SURGEON STRL SZ 6.5 (GLOVE) ×6 IMPLANT
GLOVE SURG SIGNA 7.5 PF LTX (GLOVE) ×3 IMPLANT
GOWN STRL REUS W/ TWL LRG LVL3 (GOWN DISPOSABLE) ×6 IMPLANT
GOWN STRL REUS W/ TWL XL LVL3 (GOWN DISPOSABLE) ×2 IMPLANT
GOWN STRL REUS W/TWL LRG LVL3 (GOWN DISPOSABLE) ×4
GOWN STRL REUS W/TWL XL LVL3 (GOWN DISPOSABLE) ×2
GRAFT FLEX HD 6X16 PLIABLE (Tissue) ×2 IMPLANT
IMPL EXPANDER BREAST 535CC (Breast) IMPLANT
IMPLANT BREAST 535CC (Breast) ×2 IMPLANT
IMPLANT EXPANDER BREAST 535CC (Breast) ×4 IMPLANT
KIT BASIN OR (CUSTOM PROCEDURE TRAY) ×6 IMPLANT
KIT FILL SYSTEM UNIVERSAL (SET/KITS/TRAYS/PACK) ×1 IMPLANT
KIT TURNOVER KIT B (KITS) ×6 IMPLANT
NDL 18GX1X1/2 (RX/OR ONLY) (NEEDLE) IMPLANT
NDL 21 GA WING INFUSION (NEEDLE) IMPLANT
NDL FILTER BLUNT 18X1 1/2 (NEEDLE) IMPLANT
NDL HYPO 25GX1X1/2 BEV (NEEDLE) IMPLANT
NEEDLE 18GX1X1/2 (RX/OR ONLY) (NEEDLE) IMPLANT
NEEDLE 21 GA WING INFUSION (NEEDLE) ×3 IMPLANT
NEEDLE FILTER BLUNT 18X 1/2SAF (NEEDLE)
NEEDLE FILTER BLUNT 18X1 1/2 (NEEDLE) IMPLANT
NEEDLE HYPO 25GX1X1/2 BEV (NEEDLE) IMPLANT
NS IRRIG 1000ML POUR BTL (IV SOLUTION) ×9 IMPLANT
PACK GENERAL/GYN (CUSTOM PROCEDURE TRAY) ×6 IMPLANT
PAD ABD 8X10 STRL (GAUZE/BANDAGES/DRESSINGS) ×2 IMPLANT
PAD ARMBOARD 7.5X6 YLW CONV (MISCELLANEOUS) ×6 IMPLANT
PENCIL SMOKE EVACUATOR (MISCELLANEOUS) ×3 IMPLANT
PIN SAFETY STERILE (MISCELLANEOUS) ×3 IMPLANT
SET ASEPTIC TRANSFER (MISCELLANEOUS) IMPLANT
SPECIMEN JAR X LARGE (MISCELLANEOUS) ×3 IMPLANT
SUT ETHILON 2 0 FS 18 (SUTURE) ×3 IMPLANT
SUT MNCRL AB 4-0 PS2 18 (SUTURE) ×7 IMPLANT
SUT MON AB 3-0 SH 27 (SUTURE) ×3
SUT MON AB 3-0 SH27 (SUTURE) ×4 IMPLANT
SUT MON AB 4-0 PC3 18 (SUTURE) ×3 IMPLANT
SUT MON AB 5-0 PS2 18 (SUTURE) ×6 IMPLANT
SUT PDS AB 2-0 CT1 27 (SUTURE) ×14 IMPLANT
SUT SILK 2 0 SH (SUTURE) ×3 IMPLANT
SUT SILK 4 0 PS 2 (SUTURE) ×3 IMPLANT
SUT VIC AB 3-0 SH 18 (SUTURE) ×3 IMPLANT
SYR CONTROL 10ML LL (SYRINGE) IMPLANT
TOWEL GREEN STERILE (TOWEL DISPOSABLE) ×6 IMPLANT
TOWEL GREEN STERILE FF (TOWEL DISPOSABLE) ×6 IMPLANT
TRAY FOLEY MTR SLVR 14FR STAT (SET/KITS/TRAYS/PACK) IMPLANT

## 2018-12-22 NOTE — Transfer of Care (Signed)
Immediate Anesthesia Transfer of Care Note  Patient: Adriana Moon  Procedure(s) Performed: BILATERAL MASTECTOMIES WITH RIGHT SENTINEL LYMPH NODE BIOPSY (Bilateral Breast) BILATERAL BREAST RECONSTRUCTION WITH PLACEMENT OF TISSUE EXPANDER AND FLEX HD (ACELLULAR HYDRATED DERMIS) (Bilateral )  Patient Location: PACU  Anesthesia Type:GA combined with regional for post-op pain  Level of Consciousness: awake, alert  and oriented  Airway & Oxygen Therapy: Patient Spontanous Breathing and Patient connected to face mask oxygen  Post-op Assessment: Report given to RN and Post -op Vital signs reviewed and stable  Post vital signs: Reviewed and stable  Last Vitals:  Vitals Value Taken Time  BP 118/74   Temp    Pulse 97   Resp 14   SpO2 96%     Last Pain:  Vitals:   12/22/18 0851  TempSrc:   PainSc: 0-No pain      Patients Stated Pain Goal: 2 ([id number])  Complications: No apparent anesthesia complications

## 2018-12-22 NOTE — Plan of Care (Signed)
  Problem: Education: Goal: Knowledge of General Education information will improve Description Including pain rating scale, medication(s)/side effects and non-pharmacologic comfort measures Outcome: Progressing   

## 2018-12-22 NOTE — Anesthesia Procedure Notes (Signed)
Anesthesia Regional Block: Pectoralis block   Pre-Anesthetic Checklist: ,, timeout performed, Correct Patient, Correct Site, Correct Laterality, Correct Procedure, Correct Position, site marked, Risks and benefits discussed,  Surgical consent,  Pre-op evaluation,  At surgeon's request and post-op pain management  Laterality: Left  Prep: chloraprep       Needles:  Injection technique: Single-shot  Needle Type: Echogenic Stimulator Needle     Needle Length: 10cm  Needle Gauge: 21     Additional Needles:   Procedures:,,,, ultrasound used (permanent image in chart),,,,  Narrative:  Start time: 12/22/2018 9:20 AM End time: 12/22/2018 9:30 AM Injection made incrementally with aspirations every 5 mL.  Performed by: Personally  Anesthesiologist: Murvin Natal, MD  Additional Notes: Functioning IV was confirmed and monitors were applied.  A timeout was performed. Sterile prep, hand hygiene and sterile gloves were used. A 175mm 21ga Pajunk echogenic stimulator needle was used. Negative aspiration and negative test dose prior to incremental administration of local anesthetic. The patient tolerated the procedure well.  Ultrasound guidance: relevent anatomy identified, needle position confirmed, local anesthetic spread visualized around nerve(s), vascular puncture avoided.  Image printed for medical record.

## 2018-12-22 NOTE — Anesthesia Postprocedure Evaluation (Signed)
Anesthesia Post Note  Patient: Adriana Moon  Procedure(s) Performed: BILATERAL MASTECTOMIES WITH RIGHT SENTINEL LYMPH NODE BIOPSY (Bilateral Breast) BILATERAL BREAST RECONSTRUCTION WITH PLACEMENT OF TISSUE EXPANDER AND FLEX HD (ACELLULAR HYDRATED DERMIS) (Bilateral )     Patient location during evaluation: PACU Anesthesia Type: General and Regional Level of consciousness: awake and alert Pain management: pain level controlled Vital Signs Assessment: post-procedure vital signs reviewed and stable Respiratory status: spontaneous breathing, nonlabored ventilation, respiratory function stable and patient connected to nasal cannula oxygen Cardiovascular status: blood pressure returned to baseline and stable Postop Assessment: no apparent nausea or vomiting Anesthetic complications: no    Last Vitals:  Vitals:   12/22/18 1451 12/22/18 1725  BP: (!) 151/88 137/76  Pulse: 78   Resp:    Temp: 37.2 C   SpO2: 100%     Last Pain:  Vitals:   12/22/18 1858  TempSrc:   PainSc: 7    Pain Goal: Patients Stated Pain Goal: 0 (12/22/18 1858)                 Uniqua Kihn P Oluwatobi Visser

## 2018-12-22 NOTE — Anesthesia Procedure Notes (Signed)
Procedure Name: Intubation Date/Time: 12/22/2018 10:12 AM Performed by: Genelle Bal, CRNA Pre-anesthesia Checklist: Patient identified, Emergency Drugs available, Suction available and Patient being monitored Patient Re-evaluated:Patient Re-evaluated prior to induction Oxygen Delivery Method: Circle system utilized Preoxygenation: Pre-oxygenation with 100% oxygen Induction Type: IV induction Ventilation: Mask ventilation without difficulty Laryngoscope Size: Miller and 2 Grade View: Grade I Tube type: Oral Tube size: 7.0 mm Number of attempts: 1 Airway Equipment and Method: Stylet and Oral airway Placement Confirmation: ETT inserted through vocal cords under direct vision,  positive ETCO2 and breath sounds checked- equal and bilateral Secured at: 21 cm Tube secured with: Tape Dental Injury: Teeth and Oropharynx as per pre-operative assessment

## 2018-12-22 NOTE — Op Note (Signed)
Op report    DATE OF OPERATION:  12/22/2018  LOCATION: [facility]  SURGICAL DIVISION: Plastic Surgery  PREOPERATIVE DIAGNOSES:  1. Breast cancer.    POSTOPERATIVE DIAGNOSES:  1. Breast cancer.   PROCEDURE:  1. Bilateral immediate breast reconstruction with placement of Acellular Dermal Matrix and tissue expanders.  SURGEON: Claire Sanger Dillingham, DO  ASSISTANT: Roetta Sessions, PA and [place], RNFA  ANESTHESIA:  General.   COMPLICATIONS: None.   IMPLANTS: Left - Mentor 535 cc. Ref [id number].   200 cc of injectable saline placed in the expander. Right - Mentor 535 cc. Ref [id number].  200 cc of injectable saline placed in the expander. Acellular Dermal Matrix 6 x 16 cm two Flex HD  INDICATIONS FOR PROCEDURE:  The patient, Adriana Moon, is a 38 y.o. female born on [birth year], is here for  immediate first stage breast reconstruction with placement of bilateral tissue expander and Acellular dermal matrix. MRN: [id number]  CONSENT:  Informed consent was obtained directly from the patient. Risks, benefits and alternatives were fully discussed. Specific risks including but not limited to bleeding, infection, hematoma, seroma, scarring, pain, implant infection, implant extrusion, capsular contracture, asymmetry, wound healing problems, and need for further surgery were all discussed. The patient did have an ample opportunity to have her questions answered to her satisfaction.   DESCRIPTION OF PROCEDURE:  The patient was taken to the operating room by the general surgery team. SCDs were placed and IV antibiotics were given. The patient's chest was prepped and draped in a sterile fashion. A time out was performed and the implants to be used were identified.  Bilateral mastectomies were performed.  Once the general surgery team had completed their portion of the case the patient was rendered to the plastic and reconstructive surgery team.  Left:  The pectoralis  major muscle was lifted from the chest wall with release of the lateral edge and lateral inframammary fold.  The pocket was irrigated with antibiotic solution and hemostasis was achieved with electrocautery.  The ADM was then prepared according to the manufacture guidelines and slits placed to help with postoperative fluid management.  The ADM was then sutured to the inferior and lateral edge of the inframammary fold with 2-0 PDS starting with an interrupted stitch and then a running stitch.  The lateral portion was sutured to with interrupted sutures after the expander was placed.  The expander was prepared according to the manufacture guidelines, the air evacuated and then it was placed under the ADM and pectoralis major muscle.  The inferior and lateral tabs were used to secure the expander to the chest wall with 2-0 PDS.  The drain was placed at the inframammary fold over the ADM and secured to the skin with 3-0 Silk.  The deep layers were closed with 3-0 Monocryl followed by 4-0 Monocryl.  The skin was closed with 5-0 Monocryl and then dermabond was applied.    Right:  The pectoralis major muscle was lifted from the chest wall with release of the lateral edge and lateral inframammary fold.  The pocket was irrigated with antibiotic solution and hemostasis was achieved with electrocautery.  The ADM was then prepared according to the manufacture guidelines and slits placed to help with postoperative fluid management.  The ADM was then sutured to the inferior and lateral edge of the inframammary fold with 2-0 PDS starting with an interrupted stitch and then a running stitch.  The lateral portion was sutured to with interrupted sutures after the  expander was placed.  The expander was prepared according to the manufacture guidelines, the air evacuated and then it was placed under the ADM and pectoralis major muscle.  The inferior and lateral tabs were used to secure the expander to the chest wall with 2-0 PDS.  The  drain was placed at the inframammary fold over the ADM and secured to the skin with 3-0 Silk.    The deep layers were closed with 3-0 Monocryl followed by 4-0 Monocryl.  The skin was closed with 5-0 Monocryl and then dermabond was applied.  The ABDs and breast binder were placed.  The patient tolerated the procedure well and there were no complications.  The patient was allowed to wake from anesthesia and taken to the recovery room in satisfactory condition.   The advanced practice practitioner (APP) assisted throughout the case.  The APP was essential in retraction and counter traction when needed to make the case progress smoothly.  This retraction and assistance made it possible to see the tissue plans for the procedure.  The assistance was needed for blood control, tissue re-approximation and assisted with closure of the incision site.

## 2018-12-22 NOTE — Interval H&P Note (Signed)
History and Physical Interval Note:  12/22/2018 10:16 AM  Adriana Moon  has presented today for surgery, with the diagnosis of RIGHT BREAST CANCER.  The various methods of treatment have been discussed with the patient and family. After consideration of risks, benefits and other options for treatment, the patient has consented to  Procedure(s): BILATERAL MASTECTOMIES WITH RIGHT SENTINEL LYMPH NODE BIOPSY (Bilateral) BILATERAL BREAST RECONSTRUCTION WITH PLACEMENT OF TISSUE EXPANDER AND FLEX HD (ACELLULAR HYDRATED DERMIS) (Bilateral) as a surgical intervention.  The patient's history has been reviewed, patient examined, no change in status, stable for surgery.  I have reviewed the patient's chart and labs.  Questions were answered to the patient's satisfaction.     [name]

## 2018-12-22 NOTE — Discharge Instructions (Addendum)
Medication scheduling for Norco and Tylenol  You can take norco (hydrocodone-tylenol) every 4-6 hours as needed. Take it with food and with zofran if you think you will get nauseous. Drink plenty of fluids.   Try to slowly take the Norco at longer intervals each day. If you take Norco every 4 hours the first day, try to take it every 5 hours the next day. You have a limited supply so you can supplement with tylenol to prolong pain control. Your pain control goal would be to take every 6 hours at the earliest and then slowly take it less and less each day.   Take the valium every 12 hours as needed to help relax your chest muscles. It may make you sleepy. Avoid driving when in pain, when taking valium or Norco.  Example schedule: Norco at 10 am Tylenol (dose based on bottle) at 1pm Norco at 4 pm Tylenol at 7 pm Norco at 10 pm  Please call if you have any questions. Avoid taking more than 4000mg  of tylenol in 24 hour period.        INSTRUCTIONS FOR AFTER BREAST SURGERY   You are getting ready to undergo breast surgery.  You will likely have some questions about what to expect following your operation.  The following information will help you and your family understand what to expect when you are discharged from the hospital.  Following these guidelines will help ensure a smooth recovery and reduce risks of complications.   Postoperative instructions include information on: diet, wound care, medications and physical activity.  AFTER SURGERY Expect to go home after the procedure.  In some cases, you may need to spend one night in the hospital for observation.  DIET Breast surgery does not require a specific diet.  However, I have to mention that the healthier you eat the better your body can start healing. It is important to increasing your protein intake.  This means limiting the foods with sugar and carbohydrates.  Focus on vegetables and some meat.  If you have any liposuction during  your procedure be sure to drink water.  If your urine is bright yellow, then it is concentrated, and you need to drink more water.  As a general rule after surgery, you should have 8 ounces of water every hour while awake.  If you find you are persistently nauseated or unable to take in liquids let us know.  NO TOBACCO USE or EXPOSURE.  This will slow your healing process and increase the risk of a wound.  WOUND CARE If you have a drain: You can shower five days after surgery.  Clean with baby wipes until the drain is removed.    If you have steri-strips / tape directly attached to your skin leave them in place. It is OK to get these wet.  No baths, pools or hot tubs for two weeks. We close your incision to leave the smallest and best-looking scar. No ointment or creams on your incisions until given the go ahead.  Especially not Neosporin (Too many skin reactions with this one).  A few weeks after surgery you can use Mederma and start massaging the scar. We ask you to wear your binder or sports bra for the first 6 weeks around the clock, including while sleeping. This provides added comfort and helps reduce the fluid accumulation at the surgery site.  ACTIVITY No heavy lifting until cleared by the doctor.  This usually means no more than a half-gallon of  milk.  It is OK to walk and climb stairs. In fact, moving your legs is very important to decrease your risk of a blood clot.  It will also help keep you from getting deconditioned.  Every 1 to 2 hours get up and walk for 5 minutes. This will help with a quicker recovery back to normal.  Let pain be your guide so you don't do too much.  NO, you cannot do the spring cleaning and don't plan on taking care of anyone else.  This is your time for TLC.  You will be more comfortable if you sleep and rest with your head elevated either with a few pillows under you or in a recliner.  No stomach sleeping for a few months.  WORK Everyone returns to work at  different times. As a rough guide, most people take at least 1 - 2 weeks off prior to returning to work. If you need documentation for your job, bring the forms to your postoperative follow up visit.  DRIVING Arrange for someone to bring you home from the hospital.  You may be able to drive a few days after surgery but not while taking any narcotics or valium.  BOWEL MOVEMENTS Constipation can occur after anesthesia and while taking pain medication.  It is important to stay ahead for your comfort.  We recommend taking Milk of Magnesia (2 tablespoons; twice a day) while taking the pain pills.  SEROMA This is fluid your body tried to put in the surgical site.  This is normal but if it creates tight skinny skin let us know.  It usually decreases in a few weeks.  WHEN TO CALL Call your surgeon's office if any of the following occur:  Fever 101 degrees F or greater  Excessive bleeding or fluid from the incision site.  Pain that increases over time without aid from the medications  Redness, warmth, or pus draining from incision sites  Persistent nausea or inability to take in liquids  Severe misshapen area that underwent the operation.  Here are some resources:  1. Plastic surgery website: https://www.plasticsurgery.org/for-medical-professionals/education-and-resources/publications/breast-reconstruction-magazine 2. Breast Reconstruction Awareness Campaign:  [employer]co[employer]nz 3. Plastic surgery Implant information:  https://www.plasticsurgery.org/patient-safety/breast-implant-safety [facility] Plastic Surgery Specialist  What is the benefit of having a drain?  During surgery your tissue layers are separated.  This raw surface stimulates your body to fill the space with serous fluid.  This is normal but you don't want that fluid to collect and prevent healing.  A fluid collection can also become infected.  The Jackson-Pratt (JP) drain is used to eliminate this collection of fluid  and allow the tissue to heal together.    Jackson-Pratt (JP) bulb    How to care for your drainage and suction unit at home Your drainage catheter will be connected to a collection device. The vacuum caused when the device is compressed allows drainage to collect in the device.     Wash your hands with soap and water before and after touching the system.  Empty the JP drain every 12 hours once you get home from your procedure.  Record the fluid amount on the record sheet included.  Start with stripping the drain tube to push the clots or excess fluid to the bulb.  Do this by pinching the tube with one hand near your skin.  Then with the other hand squeeze the tubing and work it toward the bulb.  This should be done several times a day.  This may collapse the tube  which will correct on its own.    Use a safety pin to attach your collection device to your clothing so there is no tension on the insertion site.    If you have drainage at the skin insertion site, you can apply a gauze dressing and secure it with tape.  If the drain falls out, apply a gauze dressing over the drain insertion site and secure with tape.   To empty the collection device:    Release the stopper on the top of the collection unit (bulb).   Pour contents into a measuring container such as a plastic medicine cup.   Record the day and amount of drainage on the attached sheet.  This should be done at least twice a day.    To compress the Jackson-Pratt Bulb:   Release the stopper at the top of the bulb.  Squeeze the bulb tightly in your fist, squeezing air out of the bulb.   Replace the stopper while the bulb is compressed.   Be careful not to spill the contents when squeezing the bulb.  The drainage will start bright red and turn to pink and then yellow with time.  IMPORTANT: If the bulb is not squeezed before adding the stopper it will not draw out the fluid.  Care for the JP drain site and your skin  daily:   You may shower three days after surgery.  Secure the drain to a ribbon or cloth around your waist while showering so it does not pull out while showering.  Be sure your hands are cleaned with soap and water.  Use a clean wet cotton swab to clean the skin around the drain site.   Use another cotton swab to place Vaseline or antibiotic ointment on the skin around the drain.     Contact your physician if any of the following occur:   The fluid in the bulb becomes cloudy.  Your temperature is greater than 101.4.   The incision opens.  If you have drainage at the skin insertion site, you can apply a gauze dressing and secure it with tape.  If the drain falls out, apply a gauze dressing over the drain insertion site and secure with tape.   You will usually have more drainage when you are active than while you rest or are asleep. If the drainage increases significantly or is bloody call the physician                             Bring this record with you to each office visit Date  Drainage Volume  Date   Drainage volume

## 2018-12-22 NOTE — Anesthesia Preprocedure Evaluation (Addendum)
Anesthesia Evaluation  Patient identified by MRN, date of birth, ID band Patient awake    Reviewed: Allergy & Precautions, NPO status , Patient's Chart, lab work & pertinent test results  Airway Mallampati: II  TM Distance: >3 FB Neck ROM: Full    Dental no notable dental hx.    Pulmonary asthma ,    Pulmonary exam normal breath sounds clear to auscultation       Cardiovascular negative cardio ROS Normal cardiovascular exam Rhythm:Regular Rate:Normal     Neuro/Psych  Headaches, PSYCHIATRIC DISORDERS Anxiety Depression    GI/Hepatic Neg liver ROS, GERD  Medicated and Controlled,  Endo/Other  Morbid obesity (Super)  Renal/GU negative Renal ROS     Musculoskeletal negative musculoskeletal ROS (+)   Abdominal (+) + obese,   Peds  Hematology negative hematology ROS (+)   Anesthesia Other Findings RIGHT BREAST CANCER  Reproductive/Obstetrics                            Anesthesia Physical Anesthesia Plan  ASA: IV  Anesthesia Plan: General and Regional   Post-op Pain Management: GA combined w/ Regional for post-op pain   Induction: Intravenous  PONV Risk Score and Plan: 3 and Ondansetron, Dexamethasone, Midazolam and Treatment may vary due to age or medical condition  Airway Management Planned: Oral ETT  Additional Equipment:   Intra-op Plan:   Post-operative Plan: Extubation in OR  Informed Consent: I have reviewed the patients History and Physical, chart, labs and discussed the procedure including the risks, benefits and alternatives for the proposed anesthesia with the patient or authorized representative who has indicated his/her understanding and acceptance.     Dental advisory given  Plan Discussed with: CRNA  Anesthesia Plan Comments:       Anesthesia Quick Evaluation

## 2018-12-22 NOTE — Interval H&P Note (Signed)
History and Physical Interval Note: no change in H and P  12/22/2018 9:22 AM  Adriana Moon  has presented today for surgery, with the diagnosis of RIGHT BREAST CANCER.  The various methods of treatment have been discussed with the patient and family. After consideration of risks, benefits and other options for treatment, the patient has consented to  Procedure(s): BILATERAL MASTECTOMIES WITH RIGHT SENTINEL LYMPH NODE BIOPSY (Bilateral) BILATERAL BREAST RECONSTRUCTION WITH PLACEMENT OF TISSUE EXPANDER AND FLEX HD (ACELLULAR HYDRATED DERMIS) (Bilateral) as a surgical intervention.  The patient's history has been reviewed, patient examined, no change in status, stable for surgery.  I have reviewed the patient's chart and labs.  Questions were answered to the patient's satisfaction.     Coralie Keens

## 2018-12-22 NOTE — Op Note (Signed)
BILATERAL MASTECTOMIES WITH RIGHT DEEP AXILLARY SENTINEL LYMPH NODE BIOPSY  Procedure Note  Celestial Venditto 12/22/2018   Pre-op Diagnosis: RIGHT BREAST CANCER     Post-op Diagnosis: SAME  Procedure(s): RIGHT MASTECTOMY WITH DEEP RIGHT AXILLARY SENTINEL LYMPH NODE BIOPSY LEFT MASTECTOMY INJECTION OF BLUE DYE    Surgeon: Coralie Keens, MD  Anesthesia: General  Staff:  Circulator: Rexanne Mano, RN Physician Assistant: Charlies Constable, PA-C Scrub Person: Garald Braver, RN RN First Assistant: [place], RN  Estimated Blood Loss: Minimal               Specimens: sent to path  Indications: This is a 38 year old female who has had a previous right lumpectomy and sentinel node biopsy for invasive breast cancer at age 28.  This was followed with radiation therapy.  She has since had bilateral reduction mammoplasty.  She now presents with a recurrent right breast cancer.  She is genetically negative.  After discussion with the patient, medical oncology, radiation oncology, and plastic surgery, the decision was made to proceed with bilateral mastectomies and biopsy of her right axillary lymph nodes.          Procedure: The patient was identified in the preoperative holding area.  Radiation technologist injected radioactive isotope around the right areola.  She was then taken to the operating room.  She is placed upon the operating table general anesthesia was induced.  She was identified as correct patient.  I injected blue dye underneath the right areola and massaged the breast.  Her bilateral breast, chest, and axilla were then prepped and draped in usual sterile fashion.  I I proceeded with the left simple mastectomy first.  I performed a longitudinal elliptical incision incorporating the nipple areolar complex and previous reduction incision.  I then took this down to the breast tissue with electrocautery.  I then dissected the skin flaps circumferentially going  toward the sternum, clavicle, inframammary ridge, and toward the axilla with electrocautery keeping the skin flaps viable and symmetric circumferentially.  I then slowly dissected the breast tissue off the pectoralis fascia with electrocautery moving medial to lateral.  I then completed the simple mastectomy at the level of the axilla and latissimus.  Once the mastectomy specimen was removed, I placed a silk suture at the axillary area and this was sent to pathology for evaluation.  I next performed another elliptical incision on the patient's right breast incorporating her previous reduction incisions in a longitudinal fashion.  I again dissected down into the breast tissue circumferentially.  I then created skin flaps circumferentially going toward the sternum, up toward the clavicle and then toward the right axilla and inframammary ridge.  I then slowly dissected the breast tissue off of the pectoralis fascia moving medial to lateral with electrocautery until I reached the axilla.  With the aid of the neoprobe, I was able to identify one sentinel lymph node which was sent to pathology for evaluation.  There were also multiple lymph nodes palpable in the axillary tail.  There was a lot of scarring in the axilla I can see previous surgical clips from her previous node biopsy.  Once this was completed, I marked the axilla with a silk suture and sent to pathology for evaluation along with the sentinel lymph nodes.  Hemostasis appeared to be achieved in both incisions.  Irrigated both chest incisions with saline.  At that point Dr. Marla Roe presented to the procedure to complete her portion of the tissue expanders.  All counts were correct at the end of my portion of the procedure.  Coralie Keens MD  Date: 12/22/2018  Time: 11:59 AM

## 2018-12-22 NOTE — Anesthesia Procedure Notes (Signed)
Anesthesia Regional Block: Pectoralis block   Pre-Anesthetic Checklist: ,, timeout performed, Correct Patient, Correct Site, Correct Laterality, Correct Procedure, Correct Position, site marked, Risks and benefits discussed,  Surgical consent,  Pre-op evaluation,  At surgeon's request and post-op pain management  Laterality: Right  Prep: chloraprep       Needles:  Injection technique: Single-shot  Needle Type: Echogenic Stimulator Needle     Needle Length: 10cm  Needle Gauge: 21     Additional Needles:   Procedures:,,,, ultrasound used (permanent image in chart),,,,  Narrative:  Start time: 12/22/2018 9:30 AM End time: 12/22/2018 9:40 AM Injection made incrementally with aspirations every 5 mL.  Performed by: Personally  Anesthesiologist: Murvin Natal, MD  Additional Notes: Functioning IV was confirmed and monitors were applied.  A timeout was performed. Sterile prep, hand hygiene and sterile gloves were used. A 132mm 21ga Pajunk echogenic stimulator needle was used. Negative aspiration and negative test dose prior to incremental administration of local anesthetic. The patient tolerated the procedure well.  Ultrasound guidance: relevent anatomy identified, needle position confirmed, local anesthetic spread visualized around nerve(s), vascular puncture avoided.  Image printed for medical record.

## 2018-12-23 ENCOUNTER — Encounter (HOSPITAL_COMMUNITY): Payer: Self-pay | Admitting: Surgery

## 2018-12-23 DIAGNOSIS — C50511 Malignant neoplasm of lower-outer quadrant of right female breast: Secondary | ICD-10-CM | POA: Diagnosis present

## 2018-12-23 DIAGNOSIS — Z923 Personal history of irradiation: Secondary | ICD-10-CM | POA: Diagnosis not present

## 2018-12-23 DIAGNOSIS — Z7951 Long term (current) use of inhaled steroids: Secondary | ICD-10-CM | POA: Diagnosis not present

## 2018-12-23 DIAGNOSIS — Z8249 Family history of ischemic heart disease and other diseases of the circulatory system: Secondary | ICD-10-CM | POA: Diagnosis not present

## 2018-12-23 DIAGNOSIS — G43909 Migraine, unspecified, not intractable, without status migrainosus: Secondary | ICD-10-CM | POA: Diagnosis present

## 2018-12-23 DIAGNOSIS — Z825 Family history of asthma and other chronic lower respiratory diseases: Secondary | ICD-10-CM | POA: Diagnosis not present

## 2018-12-23 DIAGNOSIS — Z20828 Contact with and (suspected) exposure to other viral communicable diseases: Secondary | ICD-10-CM | POA: Diagnosis present

## 2018-12-23 DIAGNOSIS — K219 Gastro-esophageal reflux disease without esophagitis: Secondary | ICD-10-CM | POA: Diagnosis present

## 2018-12-23 DIAGNOSIS — Z79899 Other long term (current) drug therapy: Secondary | ICD-10-CM | POA: Diagnosis not present

## 2018-12-23 DIAGNOSIS — N189 Chronic kidney disease, unspecified: Secondary | ICD-10-CM | POA: Diagnosis present

## 2018-12-23 DIAGNOSIS — Z803 Family history of malignant neoplasm of breast: Secondary | ICD-10-CM | POA: Diagnosis not present

## 2018-12-23 DIAGNOSIS — Z833 Family history of diabetes mellitus: Secondary | ICD-10-CM | POA: Diagnosis not present

## 2018-12-23 DIAGNOSIS — C50411 Malignant neoplasm of upper-outer quadrant of right female breast: Secondary | ICD-10-CM | POA: Diagnosis present

## 2018-12-23 DIAGNOSIS — Z801 Family history of malignant neoplasm of trachea, bronchus and lung: Secondary | ICD-10-CM | POA: Diagnosis not present

## 2018-12-23 LAB — HIV ANTIBODY (ROUTINE TESTING W REFLEX): HIV Screen 4th Generation wRfx: NONREACTIVE

## 2018-12-23 MED ORDER — HYDROCODONE-ACETAMINOPHEN 5-325 MG PO TABS
1.0000 | ORAL_TABLET | Freq: Four times a day (QID) | ORAL | Status: DC | PRN
  Administered 2018-12-23: 1 via ORAL
  Filled 2018-12-23: qty 1

## 2018-12-23 MED ORDER — DIAZEPAM 2 MG PO TABS
2.0000 mg | ORAL_TABLET | Freq: Two times a day (BID) | ORAL | Status: DC | PRN
  Administered 2018-12-23 (×2): 2 mg via ORAL
  Filled 2018-12-23 (×2): qty 1

## 2018-12-23 MED ORDER — HYDROCODONE-ACETAMINOPHEN 5-325 MG PO TABS
1.0000 | ORAL_TABLET | ORAL | Status: DC | PRN
  Administered 2018-12-23 – 2018-12-24 (×5): 1 via ORAL
  Filled 2018-12-23 (×5): qty 1

## 2018-12-23 NOTE — Progress Notes (Signed)
1 Day Post-Op  Subjective: Adriana Moon is POD#1 from bilateral mastectomies with right sentinel node biopsy by Dr. Ninfa Linden with immediate bilateral breast reconstruction with placement of expanders and flex HD by Dr. Marla Roe.   This AM she reports she has been very nauseous and had an episode of vomiting last night. She is not currently nauseous, but she notices it with movement after taking PO oxycodone. Zofran providing some relief. She has been eating.   She has no issues with IV pain medication causing nausea.   Her pain is minimally controlled and she is quite anxious this AM. She wants to go home, but knows she is not ready due to her pain level and her nausea from the medications.  No fevers, chills overnight. Other than nausea and pain, she feels good.   No BM or Flatus yet. Drains in place - serosanguinous drainage. Yesterday total 108cc (65cc L, 43cc R)  Objective: Vital signs in last 24 hours: Temp:  [97 F (36.1 C)-99 F (37.2 C)] 98.2 F (36.8 C) (09/01 0535) Pulse Rate:  [68-94] 74 (09/01 0535) Resp:  [15-26] 20 (09/01 0535) BP: (112-151)/(66-93) 135/72 (09/01 0535) SpO2:  [93 %-100 %] 98 % (09/01 0535) Last BM Date: 12/21/18  Intake/Output from previous day: 08/31 0701 - 09/01 0700 In: 3825.6 [P.O.:650; I.V.:2915.6; IV Piggyback:100] Out: 438 [Urine:230; Drains:108; Blood:100] Intake/Output this shift: No intake/output data recorded.  General appearance: alert, cooperative, no distress and resting in bed, anxious Head: Normocephalic, without obvious abnormality, atraumatic Neck: no adenopathy and supple, symmetrical, trachea midline Resp: normal rise and fall, unlabored breathing Breasts: post-mastecomy vertical incisions with dermabond. Incisions are c/d/i. No drainage noted. No erythema.  Extremities: extremities normal, atraumatic, no cyanosis or edema Pulses: 2+ and symmetric Skin: Skin color, texture, turgor normal. No rashes or  lesions Incision/Wound: Incisions all c/d/i. Drains in place inferior to b/l breasts with serosanguinous fluid.  Lab Results:  BMET No results for input(s): NA, K, CL, CO2, GLUCOSE, BUN, CREATININE, CALCIUM in the last 72 hours. PT/INR No results for input(s): LABPROT, INR in the last 72 hours. ABG No results for input(s): PHART, HCO3 in the last 72 hours.  Invalid input(s): PCO2, PO2  Studies/Results: Nm Sentinel Node Inj-no Rpt (breast)  Result Date: 12/22/2018 Sulfur colloid was injected by the nuclear medicine technologist for melanoma sentinel node.    Anti-infectives: Anti-infectives (From admission, onward)   Start     Dose/Rate Route Frequency Ordered Stop   12/22/18 1800  ceFAZolin (ANCEF) IVPB 2g/100 mL premix     2 g 200 mL/hr over 30 Minutes Intravenous Every 8 hours 12/22/18 1239     12/22/18 1038  polymyxin B 500,000 Units, bacitracin 50,000 Units in sodium chloride 0.9 % 500 mL irrigation  Status:  Discontinued       As needed 12/22/18 1039 12/22/18 1257   12/22/18 0745  ceFAZolin (ANCEF) IVPB 2g/100 mL premix     2 g 200 mL/hr over 30 Minutes Intravenous On call to O.R. 12/22/18 0742 12/22/18 1045   12/22/18 0600  ceFAZolin (ANCEF) 3 g in dextrose 5 % 50 mL IVPB  Status:  Discontinued     3 g 100 mL/hr over 30 Minutes Intravenous On call to O.R. 12/19/18 0756 12/22/18 0744      Assessment/Plan: s/p Procedure(s): POD#1  BILATERAL MASTECTOMIES WITH RIGHT SENTINEL LYMPH NODE BIOPSY BILATERAL BREAST RECONSTRUCTION WITH PLACEMENT OF TISSUE EXPANDER AND FLEX HD (ACELLULAR HYDRATED DERMIS)  Added valium to decrease muscle spasm due to expander  fill.   Changed pain medication to Norco due to nausea/vomiting and patient will be taking Norco PO at home.  Called patient ~ 12:30 PM and she is doing better in regards to nausea, but she is still in a lot of pain - may need to remove some fluid from expander for relief and progress at a slower rate.  Despite pain,  she is doing well. Incisions approximated and she is doing well.  Continue use of incentive spirometer. Miralax/Senokot for BM. Drink plenty of fluids    LOS: 0 days    Charlies Constable, PA-C 12/23/2018

## 2018-12-23 NOTE — Progress Notes (Signed)
Patient ID: Adriana Moon, female   DOB: 10-15-1980, 38 y.o.   MRN: [id number]   Working on pain control Awaiting pathology results Plans per plastic surg

## 2018-12-24 ENCOUNTER — Telehealth: Payer: Self-pay

## 2018-12-24 NOTE — Telephone Encounter (Signed)
Received voicemail from patient who has concerns about medications. Please return call at [phone]

## 2018-12-24 NOTE — Progress Notes (Signed)
Patient discharged to home with instructions. 

## 2018-12-24 NOTE — Discharge Summary (Signed)
Physician Discharge Summary  Patient ID: Adriana Moon MRN: [id number] DOB/AGE: 08-19-1980 38 y.o.  Admit date: 12/22/2018 Discharge date: 12/24/2018  Admission Diagnoses: Breast cancer  Discharge Diagnoses:  Active Problems:   Breast cancer [facility])   Discharged Condition: good  Hospital Course: Adriana Moon is a 38 yo female who presented to the [facility] main OR for surgical intervention for breast cancer. She had right sided invasive ductal carcinoma and underwent bilateral mastectomies with right sided sentinel lymph node biopsies by Dr. Nedra Hai followed by bilateral immediate breast reconstruction with placement of expanders and flex HD by Dr. Marla Roe on 12/22/2018.  POD1 she had significant pain only controlled on IV pain meds and significant nausea and some vomiting. Over the course of POD1 her pain improved and her nausea improved.   Today on exam she was doing a lot better. Pain adequately controlled on PO Norco 5-325 and nausea had nearly resolved. She had + flatus, + BM and was out of bed using the bathroom and walking around in her room. No fevers, chills over the course of her stay. VS stable. Today she feels as if her pain will be adequately controlled at home on PO Norco and is comfortable going home to her family, who will assist her with recovery.  Consults: None  Significant Diagnostic Studies: labs: none.  Treatments: IV hydration, antibiotics: Ancef, analgesia: Vicodin and Dilaudid and surgery: see above.  Discharge Exam: Blood pressure (!) 146/64, pulse 81, temperature 98 F (36.7 C), temperature source Oral, resp. rate 15, last menstrual period 12/09/2018, SpO2 100 %. General appearance: alert, cooperative, appears stated age, no distress and resting in bed Head: Normocephalic, without obvious abnormality, atraumatic Eyes: EOM intact, no icterus Neck: no adenopathy, no JVD and supple, symmetrical, trachea midline Resp: normal rise and fall of  chest, symmetric and unlabored breathing.  Breasts: post-mastecomy vertical incisions, no erythema, no swelling, no drainage Cardio: regular rate and rhythm, S1, S2 normal, no murmur, click, rub or gallop Extremities: extremities normal, atraumatic, no cyanosis or edema Pulses: 2+ and symmetric Incision/Wound: all incisions c/d/i. No erythema noted. JP drains in place, serosanguinous drainage noted.  Disposition: Discharge disposition: 01-Home or Self Care       Discharge Instructions    Call MD for:  difficulty breathing, headache or visual disturbances   Complete by: As directed    Call MD for:  extreme fatigue   Complete by: As directed    Call MD for:  hives   Complete by: As directed    Call MD for:  persistant dizziness or light-headedness   Complete by: As directed    Call MD for:  persistant nausea and vomiting   Complete by: As directed    Call MD for:  redness, tenderness, or signs of infection (pain, swelling, redness, odor or green/yellow discharge around incision site)   Complete by: As directed    Call MD for:  severe uncontrolled pain   Complete by: As directed    Call MD for:  temperature >100.4   Complete by: As directed    Diet - low sodium heart healthy   Complete by: As directed    Increase activity slowly   Complete by: As directed      Allergies as of 12/24/2018      Reactions   Prednisone    Lost taste in her mouth, also makes "me crazy & mean"    Tolmetin Hives, Swelling   Nsaids Hives, Swelling   Shellfish Allergy Hives,  Swelling      Medication List    STOP taking these medications   HYDROcodone-acetaminophen 5-325 MG tablet Commonly known as: Norco     TAKE these medications   albuterol 108 (90 Base) MCG/ACT inhaler Commonly known as: VENTOLIN HFA Inhale 2 puffs into the lungs every 6 (six) hours as needed for wheezing or shortness of breath.   cetirizine 10 MG tablet Commonly known as: ZYRTEC Take 1-2 tablets 1-2 times a day as  needed   CRANBERRY PLUS PROBIOTIC PO Take 1 capsule by mouth daily.   cyclobenzaprine 10 MG tablet Commonly known as: FLEXERIL Take 1 tablet (10 mg total) by mouth at bedtime as needed for muscle spasms.   diazepam 2 MG tablet Commonly known as: Valium Take 1 tablet (2 mg total) by mouth every 12 (twelve) hours as needed for anxiety.   famotidine 20 MG tablet Commonly known as: PEPCID Take 20 mg by mouth daily as needed for heartburn or indigestion.   fluconazole 150 MG tablet Commonly known as: DIFLUCAN Take 1 now and repeat 1 in 3 days then 1 every month before period   fluticasone 50 MCG/ACT nasal spray Commonly known as: FLONASE Place 2 sprays into both nostrils daily.   fluticasone furoate-vilanterol 100-25 MCG/INH Aepb Commonly known as: BREO ELLIPTA Inhale 1 puff into the lungs daily. What changed:   when to take this  reasons to take this   hydrOXYzine 25 MG tablet Commonly known as: ATARAX/VISTARIL Take 50 mg by mouth every 6 (six) hours as needed for anxiety or itching.   hydrOXYzine 10 MG tablet Commonly known as: ATARAX/VISTARIL Take 1 tablet (10 mg total) by mouth 3 (three) times daily as needed. Take 1 10 mg tablet tid prn anxiety, can take 2 at HS if needed   montelukast 10 MG tablet Commonly known as: SINGULAIR TAKE 1 TABLET BY MOUTH AT BEDTIME   omeprazole 40 MG capsule Commonly known as: PRILOSEC TAKE 1 CAPSULE BY MOUTH ONCE DAILY   ondansetron 4 MG tablet Commonly known as: Zofran Take 1 tablet (4 mg total) by mouth every 8 (eight) hours as needed for nausea or vomiting.   Spacer/Aero Chamber Mouthpiece Misc 1 each by Does not apply route every 6 (six) hours as needed.      Patient instructions for pain control provided. Norco 5-325 q4-6 hours pending pain level. Supplement with APAP. Not to exceed 4000mg /day. Valium at night or q12 hours to relax muscle.  Call with questions or concerns. Return precautions provided.  Follow up in  clinic in 1 week from surgery date (12/30/2018).  OOB at home to prevent VTE. Drink plenty of fluids.   Signed: Carola Rhine Paymon Rosensteel 12/24/2018, 8:36 AM

## 2018-12-25 ENCOUNTER — Telehealth: Payer: Self-pay | Admitting: *Deleted

## 2018-12-25 NOTE — Telephone Encounter (Signed)
Called patient on (12/24/18) regarding the message below.  She stated that she had some concerns regarding her medications.  First she wanted to make sure that the pain medication they prescribed for her to take at home was not the same one she was taking in the hospital.  She said that medication in the hospital made her very sick.  I informed the patient that I was looking at [facility] note from yesterday and he noted that he stopped the Oxycodone because it caused nausea and vomiting.  He changed the pain medication to Norco PO at home.  Asked the patient what does her medication bottle say.  She said it reads Hydrocodone-ACET 5-325mg .  I informed her that is Norco and its not the same medicine she first received in the hospital.  Patient verbalized understanding.  She said she had another concern.  She was prescribed Diazepam and she already take Hydroxyzine 10mg  for anxiety, and she don't feel comfortable taking both the Diazepam and Hydroxyzine.  She said she also takes Cyclobenzaprine and she wants to know if she could take the Cyclobenzaprine instead of the Diazepam.  Informed the patient that I will speak with Matthew,PA and give her a call back.  Patient verbalized understanding and agreed.

## 2018-12-25 NOTE — Telephone Encounter (Signed)
Received order for oncotype testing. Requisition faxed to pathology and [facility] °

## 2018-12-25 NOTE — Telephone Encounter (Signed)
Called patient back regarding the message below.  Informed her that I spoke with Matthew,PA and he would like for her to stop the Cyclobenzaprine and Hydroxyzine and just take the Diazepam.  He said the Diazepam with help with the anxiety and the muscle spasms.  Patient stated so he does want me to take the Diazepam, and I informed her yes he does.  She stated she take it last night, and okay she will take it.  I informed the patient if it does not work to please give [name] a call back.  Patient verbalized understanding and agreed.//AB/CMA

## 2018-12-30 ENCOUNTER — Encounter: Payer: Self-pay | Admitting: Surgical

## 2018-12-30 ENCOUNTER — Ambulatory Visit (INDEPENDENT_AMBULATORY_CARE_PROVIDER_SITE_OTHER): Payer: BC Managed Care – PPO | Admitting: Surgical

## 2018-12-30 ENCOUNTER — Other Ambulatory Visit: Payer: Self-pay

## 2018-12-30 VITALS — BP 134/88 | HR 73 | Temp 97.3°F | Ht 60.0 in | Wt 263.0 lb

## 2018-12-30 DIAGNOSIS — Z170 Estrogen receptor positive status [ER+]: Secondary | ICD-10-CM

## 2018-12-30 DIAGNOSIS — C50411 Malignant neoplasm of upper-outer quadrant of right female breast: Secondary | ICD-10-CM

## 2018-12-30 NOTE — Progress Notes (Signed)
   Subjective:     Patient ID: Adriana Moon, female    DOB: November 29, 1980, 38 y.o.   MRN: [id number]  Chief Complaint  Patient presents with  . Post-op Follow-up    for (B) breast reconstruction w/ expander and flex HD    HPI: The patient is a 38 y.o. female here for follow-up after bilateral mastectomy with immediate reconstruction on 12/22/18 with Dr. Marla Roe. She reports she is having some neck tightness and upper breast pain. It is tolerable, but has been quite painful for her. She has been using valium nightly and taking Norco PRN. She does not like taking the norco much. She has been using heating pads on her upper chest and neck for pain relief.  Incisions are healing nicely. No fever, chills, n/v. JP drains in place, output ~30-50 cc over the past few days.   She is starting to take  phentermine for weight loss assistance via weight loss clinic.  Review of Systems  Constitutional: Negative for chills, fever, malaise/fatigue and weight loss.       + activity change   Respiratory: Negative.   Cardiovascular: Negative.   Genitourinary: Negative.   Musculoskeletal: Positive for myalgias and neck pain. Negative for back pain.  Skin: Negative.   Neurological: Positive for sensory change. Negative for dizziness and headaches.     Objective:   Vital Signs BP 134/88 (BP Location: Left Arm, Patient Position: Sitting, Cuff Size: Large)   Pulse 73   Temp (!) 97.3 F (36.3 C) (Temporal)   Ht 5' (1.524 m)   Wt 263 lb (119.3 kg)   LMP 12/09/2018 (Exact Date)   SpO2 99%   BMI 51.36 kg/m  Vital Signs and Nursing Note Reviewed Chaperone present Physical Exam  Constitutional: She is oriented to person, place, and time and well-developed, well-nourished, and in no distress.  HENT:  Head: Normocephalic and atraumatic.  Cardiovascular: Normal rate.  Pulmonary/Chest: Effort normal.    JP drain in place b/l with serosanguinous output. Incisions c/d/i with dermabond  in place.  Musculoskeletal: Normal range of motion.  Neurological: She is alert and oriented to person, place, and time. Gait normal.  Skin: Skin is warm and dry. No rash noted. She is not diaphoretic. No erythema. No pallor.  Psychiatric: Mood and affect normal.    Assessment/Plan:     ICD-10-CM   1. Malignant neoplasm of upper-outer quadrant of right breast in female, estrogen receptor positive ([place])  C50.411    Z17.0     Adriana Moon is doing well overall. She is healing nicely, but is having some difficulty with pain control. Continue with valium q12 hrs PRN and norco PRN.  JP drains in place due to high output - will remove in 1 week pending output.  Continue eating healthy, drinking plenty of fluids and staying active.  We placed injectable saline in the Expander using a sterile technique: Right: 50 cc for a total of 250 / 535 cc Left: 50 cc for a total of 250 / 535 cc   Charlies Constable, PA-C 12/30/2018, 10:53 AM

## 2018-12-30 NOTE — Progress Notes (Signed)
Patient Care Team: Baruch Gouty, FNP as PCP - General (Family Medicine) Gala Romney Cristopher Estimable, MD as Consulting Physician (Gastroenterology)  DIAGNOSIS:    ICD-10-CM   1. Malignant neoplasm of upper-outer quadrant of right breast in female, estrogen receptor positive ([place])  C50.411    Z17.0     SUMMARY OF ONCOLOGIC HISTORY: Oncology History  Breast cancer of lower-outer quadrant of right female breast ([place])  06/17/2007 Surgery   right lumpectomy: Invasive ductal carcinoma 1.8 cm with abundant extracellular mucin 1.8 cm, grade 2, lymphovascular invasion present, margins negative, 7 lymph nodes negative, T1 cN0 stage IA   07/15/2007 - 08/27/2007 Radiation Therapy   adjuvant radiation therapy   09/25/2007 - 09/24/2009 Anti-estrogen oral therapy   Tamoxifen with Zoladex for 2 years patient stopped it because she felt foggy in the head   01/25/2015 Surgery   breast reduction surgery: Right breast atypical ductal hyperplasia microscopic focus left breast benign   02/25/2015 -  Anti-estrogen oral therapy   tamoxifen 20 mg daily restarted   11/03/2018 Relapse/Recurrence   Patient palpated right breast changes. Mammogram showed 4 masses in the right breast measuring 1.0cm at 2 o'clock, 0.8cm at 10 o'clock, and 0.7cm and 0.5cm at 9:30, with no right axillary adenopathy. Biopsy confirmed IDC, grade 2, HER-2 - (1+), ER +100%, PR+ 100%, Ki67 30%.    11/28/2018 Genetic Testing   Negative genetic testing results on the [facility] Common Hereditary Cancers panel. 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  The Common Hereditary Gene Panel offered by [facility] includes sequencing and/or deletion duplication testing of the following 48 genes: APC, ATM, AXIN2, BARD1, BMPR1A, BRCA1, BRCA2, BRIP1, CDH1, CDK4, CDKN2A (p14ARF), CDKN2A (p16INK4a), CHEK2, CTNNA1, DICER1, EPCAM (Deletion/duplication testing only), GREM1 (promoter region deletion/duplication testing only), KIT, MEN1,  MLH1, MSH2, MSH3, MSH6, MUTYH, NBN, NF1, NHTL1, PALB2, PDGFRA, PMS2, POLD1, POLE, PTEN, RAD50, RAD51C, RAD51D, RNF43, SDHB, SDHC, SDHD, SMAD4, SMARCA4. STK11, TP53, TSC1, TSC2, and VHL.  The following genes were evaluated for sequence changes only: SDHA and HOXB13 c.251G>A variant only.    12/22/2018 Surgery   Bilateral mastectomies Ninfa Linden): left breast: benign; right breast: IDC with DCIS, 3cm, clear margins, and one negative right axillary lymph node.    Malignant neoplasm of upper-outer quadrant of right breast in female, estrogen receptor positive ([place])  11/03/2018 Cancer Staging   Staging form: Breast, AJCC 8th Edition - Clinical stage from 11/03/2018: Stage IA (cT1b, cN0, cM0, G2, ER+, PR+, HER2-) - Signed by Gardenia Phlegm, NP on 11/12/2018   11/03/2018 Initial Diagnosis   Palpable abnormality in the right breast status post right lower breast cancer in 2009 and bilateral reduction mammoplasty in 2016, ultrasound revealed 1 cm lobulated hypoechoic mass, additional masses 8 mm, 7 mm and 5 mm were noted.  Right breast biopsy 10 o'clock position: IDC grade 2, ER 100%, PR 5%, Ki-67 30%, HER-2 -1+: Biopsy 2 o'clock position: Fat necrosis   11/28/2018 Genetic Testing   Negative genetic testing results on the [facility] Common Hereditary Cancers panel. 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  The Common Hereditary Gene Panel offered by [facility] includes sequencing and/or deletion duplication testing of the following 48 genes: APC, ATM, AXIN2, BARD1, BMPR1A, BRCA1, BRCA2, BRIP1, CDH1, CDK4, CDKN2A (p14ARF), CDKN2A (p16INK4a), CHEK2, CTNNA1, DICER1, EPCAM (Deletion/duplication testing only), GREM1 (promoter region deletion/duplication testing only), KIT, MEN1, MLH1, MSH2, MSH3, MSH6, MUTYH, NBN, NF1, NHTL1, PALB2, PDGFRA, PMS2, POLD1, POLE, PTEN,  RAD50, RAD51C, RAD51D, RNF43, SDHB, SDHC, SDHD, SMAD4, SMARCA4. STK11, TP53, TSC1, TSC2, and VHL.  The  following genes were evaluated for sequence changes only: SDHA and HOXB13 c.251G>A variant only.    12/31/2018 Cancer Staging   Staging form: Breast, AJCC 8th Edition - Pathologic stage from 12/31/2018: Stage IA (pT2, pN0, cM0, G2, ER+, PR+, HER2-) - Signed by Nicholas Lose, MD on 12/31/2018     CHIEF COMPLIANT: Follow-up s/p bilateral mastectomies to review pathology report  INTERVAL HISTORY: Adriana Moon is a 38 y.o. with above-mentioned history of right breast cancer. Genetic testing was negative. She underwent bilateral mastectomies on 12/22/18 with Dr. Ninfa Linden for which pathology showed benign left breast, and in the right breast, invasive ductal carcinoma with DCIS, 3cm, clear margins, and one negative right axillary lymph node. She presents to the clinic today to review the pathology report and discuss further treatment.   REVIEW OF SYSTEMS:   Constitutional: Denies fevers, chills or abnormal weight loss Eyes: Denies blurriness of vision Ears, nose, mouth, throat, and face: Denies mucositis or sore throat Respiratory: Denies cough, dyspnea or wheezes Cardiovascular: Denies palpitation, chest discomfort Gastrointestinal: Denies nausea, heartburn or change in bowel habits Skin: Denies abnormal skin rashes Lymphatics: Denies new lymphadenopathy or easy bruising Neurological: Denies numbness, tingling or new weaknesses Behavioral/Psych: Mood is stable, no new changes  Extremities: No lower extremity edema Breast: s/p bilateral mastectomies All other systems were reviewed with the patient and are negative.  I have reviewed the past medical history, past surgical history, social history and family history with the patient and they are unchanged from previous note.  ALLERGIES:  is allergic to prednisone; tolmetin; nsaids; and shellfish allergy.  MEDICATIONS:  Current Outpatient Medications  Medication Sig Dispense Refill   albuterol (PROVENTIL HFA;VENTOLIN HFA) 108 (90  Base) MCG/ACT inhaler Inhale 2 puffs into the lungs every 6 (six) hours as needed for wheezing or shortness of breath. 1 Inhaler 2   cetirizine (ZYRTEC) 10 MG tablet Take 1-2 tablets 1-2 times a day as needed 180 tablet 3   Cranberry-Vit C-Probiotic-Ca (CRANBERRY PLUS PROBIOTIC PO) Take 1 capsule by mouth daily.     cyclobenzaprine (FLEXERIL) 10 MG tablet Take 1 tablet (10 mg total) by mouth at bedtime as needed for muscle spasms. 30 tablet 2   diazepam (VALIUM) 2 MG tablet Take 1 tablet (2 mg total) by mouth every 12 (twelve) hours as needed for anxiety. 20 tablet 0   famotidine (PEPCID) 20 MG tablet Take 20 mg by mouth daily as needed for heartburn or indigestion.     fluconazole (DIFLUCAN) 150 MG tablet Take 1 now and repeat 1 in 3 days then 1 every month before period 6 tablet 2   fluticasone (FLONASE) 50 MCG/ACT nasal spray Place 2 sprays into both nostrils daily. 16 g 6   fluticasone furoate-vilanterol (BREO ELLIPTA) 100-25 MCG/INH AEPB Inhale 1 puff into the lungs daily. (Patient taking differently: Inhale 1 puff into the lungs daily as needed (shortness of breath or wheezing). ) 1 each 2   hydrOXYzine (ATARAX/VISTARIL) 10 MG tablet Take 1 tablet (10 mg total) by mouth 3 (three) times daily as needed. Take 1 10 mg tablet tid prn anxiety, can take 2 at HS if needed 30 tablet 1   hydrOXYzine (ATARAX/VISTARIL) 25 MG tablet Take 50 mg by mouth every 6 (six) hours as needed for anxiety or itching.     montelukast (SINGULAIR) 10 MG tablet TAKE 1 TABLET BY MOUTH AT BEDTIME 30 tablet  0   omeprazole (PRILOSEC) 40 MG capsule TAKE 1 CAPSULE BY MOUTH ONCE DAILY 30 capsule 0   ondansetron (ZOFRAN) 4 MG tablet Take 1 tablet (4 mg total) by mouth every 8 (eight) hours as needed for nausea or vomiting. 20 tablet 0   Spacer/Aero Chamber Mouthpiece MISC 1 each by Does not apply route every 6 (six) hours as needed. 1 each 0   No current facility-administered medications for this visit.      PHYSICAL EXAMINATION: ECOG PERFORMANCE STATUS: 1 - Symptomatic but completely ambulatory  Vitals:   12/31/18 1411  BP: (!) 129/46  Pulse: 91  Resp: 18  Temp: 98.5 F (36.9 C)  SpO2: 100%   Filed Weights   12/31/18 1411  Weight: 261 lb 4.8 oz (118.5 kg)    GENERAL: alert, no distress and comfortable SKIN: skin color, texture, turgor are normal, no rashes or significant lesions EYES: normal, Conjunctiva are pink and non-injected, sclera clear OROPHARYNX: no exudate, no erythema and lips, buccal mucosa, and tongue normal  NECK: supple, thyroid normal size, non-tender, without nodularity LYMPH: no palpable lymphadenopathy in the cervical, axillary or inguinal LUNGS: clear to auscultation and percussion with normal breathing effort HEART: regular rate & rhythm and no murmurs and no lower extremity edema ABDOMEN: abdomen soft, non-tender and normal bowel sounds MUSCULOSKELETAL: no cyanosis of digits and no clubbing  NEURO: alert & oriented x 3 with fluent speech, no focal motor/sensory deficits EXTREMITIES: No lower extremity edema  LABORATORY DATA:  I have reviewed the data as listed CMP Latest Ref Rng & Units 12/16/2018 05/08/2017 04/20/2016  Glucose 70 - 99 mg/dL 80 80 79  BUN 6 - 20 mg/dL 16 13 22(H)  Creatinine 0.44 - 1.00 mg/dL 1.08(H) 0.99 1.04(H)  Sodium 135 - 145 mmol/L 137 139 139  Potassium 3.5 - 5.1 mmol/L 3.9 4.0 4.2  Chloride 98 - 111 mmol/L 104 101 102  CO2 22 - 32 mmol/L 25 24 21   Calcium 8.9 - 10.3 mg/dL 8.9 9.0 9.0  Total Protein 6.0 - 8.5 g/dL - 7.2 7.3  Total Bilirubin 0.0 - 1.2 mg/dL - 0.5 0.3  Alkaline Phos 39 - 117 IU/L - 52 36(L)  AST 0 - 40 IU/L - 17 25  ALT 0 - 32 IU/L - 13 19    Lab Results  Component Value Date   WBC 6.8 12/16/2018   HGB 12.8 12/16/2018   HCT 38.9 12/16/2018   MCV 87.0 12/16/2018   PLT 344 12/16/2018   NEUTROABS 2.0 06/25/2017    ASSESSMENT & PLAN:  Malignant neoplasm of upper-outer quadrant of right breast in  female, estrogen receptor positive ([place]) 12/22/2018: Bilateral mastectomies:benign left breast, and in the right breast, invasive ductal carcinoma with DCIS, 3cm, clear margins, and one negative right axillary lymph node.   Pathology counseling: I discussed the final pathology report of the patient provided  a copy of this report. I discussed the margins as well as lymph node surgeries. We also discussed the final staging along with previously performed ER/PR and HER-2/neu testing.  Treatment plan: 1.  Oncotype DX testing to determine if chemotherapy would be of any benefit. 2.  Followed by adjuvant antiestrogen therapy.  We discussed the role of tamoxifen versus Zoladex with anastrozole.  We might try 10 mg of tamoxifen initially and increase to 20 mg because she had not tolerated it in 2009. I discussed with her the mechanism of ovarian suppression with anastrozole versus tamoxifen.  I will call her with the  results of these tests.  No orders of the defined types were placed in this encounter.  The patient has a good understanding of the overall plan. she agrees with it. she will call with any problems that may develop before the next visit here.  Nicholas Lose, MD 12/31/2018  Julious Oka Dorshimer am acting as scribe for Dr. Nicholas Lose.  I have reviewed the above documentation for accuracy and completeness, and I agree with the above.

## 2018-12-31 ENCOUNTER — Inpatient Hospital Stay: Payer: BC Managed Care – PPO | Attending: Hematology and Oncology | Admitting: Hematology and Oncology

## 2018-12-31 ENCOUNTER — Other Ambulatory Visit: Payer: Self-pay

## 2018-12-31 DIAGNOSIS — Z170 Estrogen receptor positive status [ER+]: Secondary | ICD-10-CM | POA: Diagnosis not present

## 2018-12-31 DIAGNOSIS — C50411 Malignant neoplasm of upper-outer quadrant of right female breast: Secondary | ICD-10-CM | POA: Insufficient documentation

## 2018-12-31 NOTE — Assessment & Plan Note (Signed)
12/22/2018: Bilateral mastectomies:benign left breast, and in the right breast, invasive ductal carcinoma with DCIS, 3cm, clear margins, and one negative right axillary lymph node.   Pathology counseling: I discussed the final pathology report of the patient provided  a copy of this report. I discussed the margins as well as lymph node surgeries. We also discussed the final staging along with previously performed ER/PR and HER-2/neu testing.  Treatment plan: 1.  Oncotype DX testing to determine if chemotherapy would be of any benefit. 2.  Followed by adjuvant antiestrogen therapy.  We discussed the role of tamoxifen versus Zoladex with anastrozole.  We might try 10 mg of tamoxifen initially and increase to 20 mg because she had not tolerated it in 2009.

## 2019-01-02 ENCOUNTER — Telehealth: Payer: Self-pay | Admitting: Surgical

## 2019-01-02 MED ORDER — CYCLOBENZAPRINE HCL 10 MG PO TABS: 10.0000 mg | ORAL_TABLET | Freq: Two times a day (BID) | ORAL | 0 refills | Status: DC

## 2019-01-02 NOTE — Telephone Encounter (Signed)
Mrs. Adriana Moon called the office last night reporting that she had some pain still in her neck and breast after expansion.  She reported that the Valium 2 mg was not helpful.   She has been using Biofreeze gel on her neck and upper breast.  She understands not to put the Biofreeze near the incisions.  She takes cyclobenzaprine 10 mg for back pain and stated that has helped her in the past with muscle spasms.  That would be fine and I have sent in a prescription for Flexeril 10 mg twice daily as needed.  She knows not to drive if taking the Flexeril.  It may make her sleepy.  She knows to call with any other further questions or concerns.

## 2019-01-04 ENCOUNTER — Other Ambulatory Visit: Payer: Self-pay | Admitting: Family

## 2019-01-04 DIAGNOSIS — K219 Gastro-esophageal reflux disease without esophagitis: Secondary | ICD-10-CM

## 2019-01-05 ENCOUNTER — Telehealth: Payer: Self-pay

## 2019-01-05 NOTE — Telephone Encounter (Signed)

## 2019-01-06 ENCOUNTER — Other Ambulatory Visit: Payer: Self-pay

## 2019-01-06 ENCOUNTER — Encounter: Payer: Self-pay | Admitting: Surgical

## 2019-01-06 ENCOUNTER — Ambulatory Visit (INDEPENDENT_AMBULATORY_CARE_PROVIDER_SITE_OTHER): Payer: BC Managed Care – PPO | Admitting: Surgical

## 2019-01-06 VITALS — BP 122/85 | HR 68 | Temp 97.3°F | Ht 60.0 in | Wt 258.2 lb

## 2019-01-06 DIAGNOSIS — Z170 Estrogen receptor positive status [ER+]: Secondary | ICD-10-CM

## 2019-01-06 DIAGNOSIS — C50511 Malignant neoplasm of lower-outer quadrant of right female breast: Secondary | ICD-10-CM

## 2019-01-06 DIAGNOSIS — C50411 Malignant neoplasm of upper-outer quadrant of right female breast: Secondary | ICD-10-CM

## 2019-01-06 NOTE — Progress Notes (Signed)
Subjective:     Patient ID: Adriana Moon, female    DOB: December 28, 1980, 38 y.o.   MRN: [id number]  Chief Complaint  Patient presents with   Follow-up    2 week for (B) immediate breast reconstruction with expander and HD    HPI: The patient is a 38 y.o. female here for follow-up on her bilateral breast reconstruction post mastectomy.  She underwent a bilateral mastectomy by Dr. Ninfa Linden followed by immediate reconstruction and placement of expanders and acellular dermis by Dr. Marla Roe on 12/22/2018.  She still has her JP drains in place.  And her output has been 30 to 40 cc bilaterally for the past 4 days. The output is mostly serosanguineous.  She has been taking cyclobenzaprine for back pain and pectoralis muscle spasms due to expansion.  She was taking Valium but reports that the cyclobenzaprine has worked really well for her in the past for back pain.  The cyclobenzaprine has been working well and she has stopped Valium.  She is still having some tightness in her anterior chest bilaterally but reports it is slowly improving.  No fever, chills, nausea, vomiting.  Incisions are C/D/I.  No sign of infection, erythema, rashes.  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  Review of Systems  Constitutional: Negative for chills, diaphoresis, fever, malaise/fatigue and weight loss.       Positive activity change  Respiratory: Negative.   Cardiovascular: Negative for chest pain, palpitations and orthopnea.  Genitourinary: Negative.   Musculoskeletal: Positive for back pain and myalgias.  Skin: Negative for itching and rash.  Neurological: Positive for sensory change. Negative for dizziness, weakness and headaches.  Psychiatric/Behavioral: Negative.      Objective:   Vital Signs BP 122/85 (BP Location: Left Arm, Patient Position: Sitting, Cuff Size: Large)    Pulse 68    Temp (!) 97.3 F (36.3 C) (Temporal)    Ht 5' (1.524 m)    Wt 258 lb  3.2 oz (117.1 kg)    LMP 01/01/2019    SpO2 99%    BMI 50.43 kg/m  Vital Signs and Nursing Note Reviewed  Physical Exam  Constitutional: She is oriented to person, place, and time and well-developed, well-nourished, and in no distress.  HENT:  Head: Normocephalic and atraumatic.  Cardiovascular: Normal rate.  Pulmonary/Chest: Effort normal.    Musculoskeletal: Normal range of motion.        General: No tenderness, deformity or edema.  Neurological: She is alert and oriented to person, place, and time. Gait normal.  Skin: Skin is warm and dry. No rash noted. She is not diaphoretic. No erythema. No pallor.  Psychiatric: Mood and affect normal.    Assessment/Plan:     ICD-10-CM   1. Malignant neoplasm of upper-outer quadrant of right breast in female, estrogen receptor positive ([place])  C50.411    Z17.0   2. Malignant neoplasm of lower-outer quadrant of right breast of female, estrogen receptor positive ([place])  C50.511    Z17.0     Overall Adriana Moon is doing well.  She is still having some muscle spasms of her back and her pectoralis muscles.  She reports that it is improving and now that she is taking cyclobenzaprine has been more helpful than the Valium.  Her incisions are healing well.  No sign of infection, erythema.  She still has her JP drains in place and will remain in place until Friday at a follow-up or Tuesday next week.  She is going to call the clinic Thursday to let us know if she would like to come in Friday or Tuesday depending on the drain output.  Continue to eat healthy, drink plenty of water, take a multivitamin  We placed injectable saline in the Expander using a sterile technique: Right: 50 cc for a total of 300 / 535 cc Left: 50 cc for a total of 300 / 535 cc  Follow-up September 18 or September 22 depending on drainage output prior to the 18th.  Carola Rhine Latona Krichbaum, PA-C 01/06/2019, 2:02 PM

## 2019-01-09 ENCOUNTER — Ambulatory Visit: Payer: BC Managed Care – PPO | Admitting: Surgical

## 2019-01-12 ENCOUNTER — Telehealth: Payer: Self-pay

## 2019-01-12 NOTE — Progress Notes (Signed)
   Subjective:     Patient ID: Adriana Moon, female    DOB: [birth year], 38 y.o.   MRN: [id number]  No chief complaint on file.   HPI: The patient is a 38 y.o. female here for follow-up on her bilateral breast reconstruction. She is post op 3 weeks. She still has her JP drains in place due to high output at last visit. Drain output has decreased and there is no sign of seroma.  Incisions healing well.  No redness noted.  Review of Systems  Constitutional: Negative for chills and fever.  Eyes: Negative.   Cardiovascular: Negative.   Genitourinary: Negative.   Musculoskeletal: Negative.   Psychiatric/Behavioral: Negative.      Objective:   Vital Signs LMP 01/01/2019  Vital Signs and Nursing Note Reviewed  Physical Exam  Constitutional: She is oriented to person, place, and time and well-developed, well-nourished, and in no distress.  HENT:  Head: Normocephalic and atraumatic.  Cardiovascular: Normal rate.  Pulmonary/Chest: Effort normal.  Neurological: She is alert and oriented to person, place, and time.  Skin: Skin is warm.  Psychiatric: Affect and judgment normal.      Assessment/Plan:     ICD-10-CM   1. Malignant neoplasm of upper-outer quadrant of right breast in female, estrogen receptor positive ([place])  C50.411    Z17.0     Drains removed.  Continue binder or sports bra. Out of work for 2 weeks.  We placed injectable saline in the Expander using a sterile technique: Right:50cc for a total of 350/ 535cc Left:50cc for a total of 350/ 535cc   Carola Rhine Scheeler, PA-C 01/12/2019, 9:57 AM

## 2019-01-12 NOTE — Telephone Encounter (Signed)

## 2019-01-13 ENCOUNTER — Other Ambulatory Visit: Payer: Self-pay

## 2019-01-13 ENCOUNTER — Ambulatory Visit (INDEPENDENT_AMBULATORY_CARE_PROVIDER_SITE_OTHER): Payer: BC Managed Care – PPO | Admitting: Plastic Surgery

## 2019-01-13 ENCOUNTER — Encounter: Payer: Self-pay | Admitting: Surgical

## 2019-01-13 VITALS — BP 121/87 | HR 80 | Temp 97.3°F | Ht 60.0 in | Wt 261.4 lb

## 2019-01-13 DIAGNOSIS — Z170 Estrogen receptor positive status [ER+]: Secondary | ICD-10-CM

## 2019-01-13 DIAGNOSIS — Z9010 Acquired absence of unspecified breast and nipple: Secondary | ICD-10-CM | POA: Insufficient documentation

## 2019-01-13 DIAGNOSIS — C50411 Malignant neoplasm of upper-outer quadrant of right female breast: Secondary | ICD-10-CM

## 2019-01-13 DIAGNOSIS — Z9013 Acquired absence of bilateral breasts and nipples: Secondary | ICD-10-CM

## 2019-01-16 DIAGNOSIS — E282 Polycystic ovarian syndrome: Secondary | ICD-10-CM | POA: Diagnosis not present

## 2019-01-16 DIAGNOSIS — Z6841 Body Mass Index (BMI) 40.0 and over, adult: Secondary | ICD-10-CM | POA: Diagnosis not present

## 2019-01-16 DIAGNOSIS — N926 Irregular menstruation, unspecified: Secondary | ICD-10-CM | POA: Diagnosis not present

## 2019-01-20 ENCOUNTER — Telehealth: Payer: Self-pay

## 2019-01-20 NOTE — Telephone Encounter (Signed)
Adriana Moon called with some questions about left axillary pain and swelling of her left breast.  She has not noticed any redness, drainage, warmth, dehiscence of her incision.  She reports that it feels swollen on the left.  Denies any fever, chills, nausea, vomiting.  She is scheduled to be seen 1 week from today.  She knows to call if she develops any fever, chills, nausea, vomiting, erythema of the breast, dehiscence of her wound.  She understood and reports she will let us know.  She is welcome to come sooner.  Call with questions or concerns

## 2019-01-20 NOTE — Telephone Encounter (Signed)
Patient called to say she is experiencing pain under her arm. She also notes that the skin on her breast is "dimpling" and would like to know if this is normal. She has an appointment with [name] in 1 week but is wondering if she should be seen earlier.

## 2019-01-23 ENCOUNTER — Telehealth: Payer: Self-pay | Admitting: *Deleted

## 2019-01-23 NOTE — Telephone Encounter (Signed)
Received oncotype score of 18/5%. Physician team notified.Called pt with results and discussed based on these results chemo is not recommended. Received verbal understanding. Denies further needs or questions at this time.

## 2019-01-26 ENCOUNTER — Telehealth: Payer: Self-pay | Admitting: Surgical

## 2019-01-26 ENCOUNTER — Encounter (HOSPITAL_COMMUNITY): Payer: Self-pay | Admitting: Surgery

## 2019-01-26 ENCOUNTER — Encounter: Payer: Self-pay | Admitting: Hematology and Oncology

## 2019-01-26 NOTE — Telephone Encounter (Signed)

## 2019-01-27 ENCOUNTER — Encounter: Payer: Self-pay | Admitting: Surgical

## 2019-01-27 ENCOUNTER — Other Ambulatory Visit: Payer: Self-pay

## 2019-01-27 ENCOUNTER — Ambulatory Visit (INDEPENDENT_AMBULATORY_CARE_PROVIDER_SITE_OTHER): Payer: BC Managed Care – PPO | Admitting: Surgical

## 2019-01-27 VITALS — BP 130/85 | HR 68 | Temp 97.3°F | Ht 60.0 in | Wt 268.6 lb

## 2019-01-27 DIAGNOSIS — Z170 Estrogen receptor positive status [ER+]: Secondary | ICD-10-CM

## 2019-01-27 DIAGNOSIS — C50411 Malignant neoplasm of upper-outer quadrant of right female breast: Secondary | ICD-10-CM

## 2019-01-27 NOTE — Progress Notes (Signed)
   Subjective:     Patient ID: Adriana Moon, female    DOB: November 21, 1980, 38 y.o.   MRN: [id number]  Chief Complaint  Patient presents with  . Follow-up    2 weeks     HPI: The patient is a 38 y.o. female here for follow-up on her bilateral breast reconstruction. 5 weeks post-op.  She is still having some tenderness in her chest wall/breasts and lateral breast/midaxillary region. She tolerated her last fill fine and has been taking flexeril PRN. She does not take it daily.   She feels as if there is some fluid accumulation at the medial aspect of both breasts. No overlying erythema. No fluid wave noted, but it does feel as if there is some fluid accumulating. Not enough to warrant a drain at this time. Overlying skin is not tight, warm.   Denies fever, chills, n/v. She is having some issues with ROM of her b/l arms/shoulders due to tightness in her pectoralis muscle. She has not returned to work yet as she would be unable to do the necessary tasks at work.   Review of Systems  Constitutional: Negative for chills, diaphoresis, fever and malaise/fatigue.  Respiratory: Negative.   Cardiovascular: Negative.   Gastrointestinal: Negative for abdominal pain, nausea and vomiting.  Musculoskeletal: Positive for myalgias (pectoralis).  Skin: Negative for itching and rash.       Right breast radiation rash/damage  Neurological: Negative for dizziness, weakness and headaches.     Objective:   Vital Signs BP 130/85 (BP Location: Left Arm, Patient Position: Sitting, Cuff Size: Large)   Pulse 68   Temp (!) 97.3 F (36.3 C) (Temporal)   Ht 5' (1.524 m)   Wt 268 lb 9.6 oz (121.8 kg)   LMP 01/01/2019 (Exact Date)   SpO2 97%   BMI 52.46 kg/m  Vital Signs and Nursing Note Reviewed Chaperone present Physical Exam  Constitutional: She is oriented to person, place, and time and well-developed, well-nourished, and in no distress.  HENT:  Head: Normocephalic and atraumatic.   Cardiovascular: Normal rate.  Pulmonary/Chest: Effort normal.    Musculoskeletal: Normal range of motion.  Neurological: She is alert and oriented to person, place, and time. Gait normal.  Skin: Skin is warm and dry. No rash noted. She is not diaphoretic. No erythema. No pallor.  Psychiatric: Mood and affect normal.      Assessment/Plan:     ICD-10-CM   1. Malignant neoplasm of upper-outer quadrant of right breast in female, estrogen receptor positive ([place])  C50.411    Z17.0     She is healing really well. Wounds all c/d/i, no erythema, swelling, sign of infection.  Continue taking flexeril PRN. Take flexeril tonight and over the next few days  We placed injectable saline in the Expander using a sterile technique: Right:50cc for a total of400/ 535cc Left:50 cc for a total of400/ 535cc  Follow up in 2 weeks. PT eval sent. Out of work for 2 more weeks.   Adriana Rhine Lavaun Greenfield, PA-C 01/27/2019, 10:08 AM     \

## 2019-01-28 NOTE — Addendum Note (Signed)
Addended byRoetta Sessions on: 01/28/2019 02:20 PM   Modules accepted: Orders

## 2019-01-28 NOTE — Progress Notes (Signed)
Patient Care Team: Baruch Gouty, FNP as PCP - General (Family Medicine) Gala Romney Cristopher Estimable, MD as Consulting Physician (Gastroenterology)  DIAGNOSIS:    ICD-10-CM   1. Malignant neoplasm of upper-outer quadrant of right breast in female, estrogen receptor positive ([place])  C50.411    Z17.0     SUMMARY OF ONCOLOGIC HISTORY: Oncology History  Breast cancer of lower-outer quadrant of right female breast ([place])  06/17/2007 Surgery   right lumpectomy: Invasive ductal carcinoma 1.8 cm with abundant extracellular mucin 1.8 cm, grade 2, lymphovascular invasion present, margins negative, 7 lymph nodes negative, T1 cN0 stage IA   07/15/2007 - 08/27/2007 Radiation Therapy   adjuvant radiation therapy   09/25/2007 - 09/24/2009 Anti-estrogen oral therapy   Tamoxifen with Zoladex for 2 years patient stopped it because she felt foggy in the head   01/25/2015 Surgery   breast reduction surgery: Right breast atypical ductal hyperplasia microscopic focus left breast benign   02/25/2015 -  Anti-estrogen oral therapy   tamoxifen 20 mg daily restarted   11/03/2018 Relapse/Recurrence   Patient palpated right breast changes. Mammogram showed 4 masses in the right breast measuring 1.0cm at 2 o'clock, 0.8cm at 10 o'clock, and 0.7cm and 0.5cm at 9:30, with no right axillary adenopathy. Biopsy confirmed IDC, grade 2, HER-2 - (1+), ER +100%, PR+ 100%, Ki67 30%.    11/28/2018 Genetic Testing   Negative genetic testing results on the [facility] Common Hereditary Cancers panel. 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  The Common Hereditary Gene Panel offered by [facility] includes sequencing and/or deletion duplication testing of the following 48 genes: APC, ATM, AXIN2, BARD1, BMPR1A, BRCA1, BRCA2, BRIP1, CDH1, CDK4, CDKN2A (p14ARF), CDKN2A (p16INK4a), CHEK2, CTNNA1, DICER1, EPCAM (Deletion/duplication testing only), GREM1 (promoter region deletion/duplication testing only), KIT, MEN1,  MLH1, MSH2, MSH3, MSH6, MUTYH, NBN, NF1, NHTL1, PALB2, PDGFRA, PMS2, POLD1, POLE, PTEN, RAD50, RAD51C, RAD51D, RNF43, SDHB, SDHC, SDHD, SMAD4, SMARCA4. STK11, TP53, TSC1, TSC2, and VHL.  The following genes were evaluated for sequence changes only: SDHA and HOXB13 c.251G>A variant only.    12/22/2018 Surgery   Bilateral mastectomies Ninfa Linden): left breast: benign; right breast: IDC with DCIS, 3cm, clear margins, and one negative right axillary lymph node.    01/23/2019 Oncotype testing   Oncotype score of 18/5%.   Malignant neoplasm of upper-outer quadrant of right breast in female, estrogen receptor positive ([place])  11/03/2018 Cancer Staging   Staging form: Breast, AJCC 8th Edition - Clinical stage from 11/03/2018: Stage IA (cT1b, cN0, cM0, G2, ER+, PR+, HER2-) - Signed by Gardenia Phlegm, NP on 11/12/2018   11/03/2018 Initial Diagnosis   Palpable abnormality in the right breast status post right lower breast cancer in 2009 and bilateral reduction mammoplasty in 2016, ultrasound revealed 1 cm lobulated hypoechoic mass, additional masses 8 mm, 7 mm and 5 mm were noted.  Right breast biopsy 10 o'clock position: IDC grade 2, ER 100%, PR 5%, Ki-67 30%, HER-2 -1+: Biopsy 2 o'clock position: Fat necrosis   11/28/2018 Genetic Testing   Negative genetic testing results on the [facility] Common Hereditary Cancers panel. 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  The Common Hereditary Gene Panel offered by [facility] includes sequencing and/or deletion duplication testing of the following 48 genes: APC, ATM, AXIN2, BARD1, BMPR1A, BRCA1, BRCA2, BRIP1, CDH1, CDK4, CDKN2A (p14ARF), CDKN2A (p16INK4a), CHEK2, CTNNA1, DICER1, EPCAM (Deletion/duplication testing only), GREM1 (promoter region deletion/duplication testing only), KIT, MEN1, MLH1, MSH2, MSH3,  MSH6, MUTYH, NBN, NF1, NHTL1, PALB2, PDGFRA, PMS2, POLD1, POLE, PTEN, RAD50, RAD51C, RAD51D, RNF43, SDHB, SDHC, SDHD,  SMAD4, SMARCA4. STK11, TP53, TSC1, TSC2, and VHL.  The following genes were evaluated for sequence changes only: SDHA and HOXB13 c.251G>A variant only.    12/22/2018 Surgery   Bilateral mastectomies: Benign left breast, and in the right breast, invasive ductal carcinoma with DCIS, 3cm, clear margins, and one negative right axillary lymph node.    12/31/2018 Cancer Staging   Staging form: Breast, AJCC 8th Edition - Pathologic stage from 12/31/2018: Stage IA (pT2, pN0, cM0, G2, ER+, PR+, HER2-) - Signed by Nicholas Lose, MD on 12/31/2018   01/23/2019 Oncotype testing   Oncotype score of 18/5%.     CHIEF COMPLIANT: Follow-up to discuss anti-estrogen therapy  INTERVAL HISTORY: Adriana Moon is a 38 y.o. with above-mentioned history of right breast cancer who underwent bilateral mastectomies. She presents to the clinic today to discuss anti-estrogen therapy.  She is here today to begin tamoxifen.  She has many questions about the treatment.  She is recovering from the prior surgery and has healed very well but she still has some discomfort for which she is planning to see physical therapy.  REVIEW OF SYSTEMS:   Constitutional: Denies fevers, chills or abnormal weight loss Eyes: Denies blurriness of vision Ears, nose, mouth, throat, and face: Denies mucositis or sore throat Respiratory: Denies cough, dyspnea or wheezes Cardiovascular: Denies palpitation, chest discomfort Gastrointestinal: Denies nausea, heartburn or change in bowel habits Skin: Denies abnormal skin rashes Lymphatics: Denies new lymphadenopathy or easy bruising Neurological: Denies numbness, tingling or new weaknesses Behavioral/Psych: Mood is stable, no new changes  Extremities: No lower extremity edema Breast: Bilateral mastectomies All other systems were reviewed with the patient and are negative.  I have reviewed the past medical history, past surgical history, social history and family history with the patient  and they are unchanged from previous note.  ALLERGIES:  is allergic to prednisone; tolmetin; nsaids; and shellfish allergy.  MEDICATIONS:  Current Outpatient Medications  Medication Sig Dispense Refill   albuterol (PROVENTIL HFA;VENTOLIN HFA) 108 (90 Base) MCG/ACT inhaler Inhale 2 puffs into the lungs every 6 (six) hours as needed for wheezing or shortness of breath. 1 Inhaler 2   cetirizine (ZYRTEC) 10 MG tablet Take 1-2 tablets 1-2 times a day as needed 180 tablet 3   Cranberry-Vit C-Probiotic-Ca (CRANBERRY PLUS PROBIOTIC PO) Take 1 capsule by mouth daily.     cyclobenzaprine (FLEXERIL) 10 MG tablet Take 1 tablet (10 mg total) by mouth 2 (two) times daily. 30 tablet 0   diazepam (VALIUM) 2 MG tablet Take 1 tablet (2 mg total) by mouth every 12 (twelve) hours as needed for anxiety. 20 tablet 0   famotidine (PEPCID) 20 MG tablet Take 20 mg by mouth daily as needed for heartburn or indigestion.     fluconazole (DIFLUCAN) 150 MG tablet Take 1 now and repeat 1 in 3 days then 1 every month before period 6 tablet 2   fluticasone (FLONASE) 50 MCG/ACT nasal spray Place 2 sprays into both nostrils daily. 16 g 6   fluticasone furoate-vilanterol (BREO ELLIPTA) 100-25 MCG/INH AEPB Inhale 1 puff into the lungs daily. (Patient taking differently: Inhale 1 puff into the lungs daily as needed (shortness of breath or wheezing). ) 1 each 2   hydrOXYzine (ATARAX/VISTARIL) 10 MG tablet Take 1 tablet (10 mg total) by mouth 3 (three) times daily as needed. Take 1 10 mg tablet tid prn anxiety, can  take 2 at HS if needed 30 tablet 1   hydrOXYzine (ATARAX/VISTARIL) 25 MG tablet Take 50 mg by mouth every 6 (six) hours as needed for anxiety or itching.     metFORMIN (GLUCOPHAGE) 500 MG tablet Take 1 tablet by mouth 2 (two) times daily.     montelukast (SINGULAIR) 10 MG tablet TAKE 1 TABLET BY MOUTH AT BEDTIME 30 tablet 0   omeprazole (PRILOSEC) 40 MG capsule Take 1 capsule (40 mg total) by mouth daily.  (Needs to be seen before next refill) 30 capsule 0   ondansetron (ZOFRAN) 4 MG tablet Take 1 tablet (4 mg total) by mouth every 8 (eight) hours as needed for nausea or vomiting. 20 tablet 0   phentermine (ADIPEX-P) 37.5 MG tablet Take 37.5 mg by mouth daily.     Spacer/Aero Chamber Mouthpiece MISC 1 each by Does not apply route every 6 (six) hours as needed. 1 each 0   tamoxifen (NOLVADEX) 20 MG tablet Take 1 tablet (20 mg total) by mouth daily. 90 tablet 3   No current facility-administered medications for this visit.     PHYSICAL EXAMINATION: ECOG PERFORMANCE STATUS: 1 - Symptomatic but completely ambulatory  Vitals:   01/29/19 1108  BP: 116/90  Pulse: 72  Resp: 18  Temp: 98.9 F (37.2 C)  SpO2: 100%   Filed Weights   01/29/19 1108  Weight: 269 lb 6.4 oz (122.2 kg)    GENERAL: alert, no distress and comfortable SKIN: skin color, texture, turgor are normal, no rashes or significant lesions EYES: normal, Conjunctiva are pink and non-injected, sclera clear OROPHARYNX: no exudate, no erythema and lips, buccal mucosa, and tongue normal  NECK: supple, thyroid normal size, non-tender, without nodularity LYMPH: no palpable lymphadenopathy in the cervical, axillary or inguinal LUNGS: clear to auscultation and percussion with normal breathing effort HEART: regular rate & rhythm and no murmurs and no lower extremity edema ABDOMEN: abdomen soft, non-tender and normal bowel sounds MUSCULOSKELETAL: no cyanosis of digits and no clubbing  NEURO: alert & oriented x 3 with fluent speech, no focal motor/sensory deficits EXTREMITIES: No lower extremity edema  LABORATORY DATA:  I have reviewed the data as listed CMP Latest Ref Rng & Units 12/16/2018 05/08/2017 04/20/2016  Glucose 70 - 99 mg/dL 80 80 79  BUN 6 - 20 mg/dL 16 13 22(H)  Creatinine 0.44 - 1.00 mg/dL 1.08(H) 0.99 1.04(H)  Sodium 135 - 145 mmol/L 137 139 139  Potassium 3.5 - 5.1 mmol/L 3.9 4.0 4.2  Chloride 98 - 111 mmol/L 104  101 102  CO2 22 - 32 mmol/L _0 Calcium 8.9 - 10.3 mg/dL 8.9 9.0 9.0  Total Protein 6.0 - 8.5 g/dL - 7.2 7.3  Total Bilirubin 0.0 - 1.2 mg/dL - 0.5 0.3  Alkaline Phos 39 - 117 IU/L - 52 36(L)  AST 0 - 40 IU/L - 17 25  ALT 0 - 32 IU/L - 13 19    Lab Results  Component Value Date   WBC 6.8 12/16/2018   HGB 12.8 12/16/2018   HCT 38.9 12/16/2018   MCV 87.0 12/16/2018   PLT 344 12/16/2018   NEUTROABS 2.0 06/25/2017    ASSESSMENT & PLAN:  Malignant neoplasm of upper-outer quadrant of right breast in female, estrogen receptor positive ([place]) 12/22/2018: Bilateral mastectomies:benign left breast, and in the right breast, invasive ductal carcinoma with DCIS, 3cm, clear margins, and one negative right axillary lymph node.   Oncotype DX score 18: Distant recurrence at 9 years: 5%  Treatment plan: Adjuvant antiestrogen therapy with tamoxifen.  She will start at 10 mg daily and then increase up to 20 mg if she tolerates it well.  I discussed with her that the hot flashes tend to get better over time.  Return to clinic in 3 months for survivorship care plan visit.  I will see her after that in 6 months from survivorship appointment.      No orders of the defined types were placed in this encounter.  The patient has a good understanding of the overall plan. she agrees with it. she will call with any problems that may develop before the next visit here.  Nicholas Lose, MD 01/29/2019  Julious Oka Dorshimer am acting as scribe for Dr. Nicholas Lose.  I have reviewed the above documentation for accuracy and completeness, and I agree with the above.

## 2019-01-29 ENCOUNTER — Inpatient Hospital Stay: Payer: BC Managed Care – PPO | Attending: Hematology and Oncology | Admitting: Hematology and Oncology

## 2019-01-29 ENCOUNTER — Other Ambulatory Visit: Payer: Self-pay

## 2019-01-29 DIAGNOSIS — Z170 Estrogen receptor positive status [ER+]: Secondary | ICD-10-CM | POA: Diagnosis not present

## 2019-01-29 DIAGNOSIS — C50411 Malignant neoplasm of upper-outer quadrant of right female breast: Secondary | ICD-10-CM | POA: Diagnosis not present

## 2019-01-29 MED ORDER — TAMOXIFEN CITRATE 20 MG PO TABS: 20.0000 mg | ORAL_TABLET | Freq: Every day | ORAL | 3 refills | Status: DC

## 2019-01-29 NOTE — Assessment & Plan Note (Addendum)
12/22/2018: Bilateral mastectomies:benign left breast, and in the right breast, invasive ductal carcinoma with DCIS, 3cm, clear margins, and one negative right axillary lymph node.   Oncotype DX score 18: Distant recurrence at 9 years: 5%  Treatment plan: Adjuvant antiestrogen therapy with tamoxifen.  She will start at 10 mg daily and then increase up to 20 mg if she tolerates it well.  I discussed with her that the hot flashes tend to get better over time.  Return to clinic in 3 months for survivorship care plan visit.  I will see her after that in 6 months from survivorship appointment.

## 2019-01-30 ENCOUNTER — Telehealth: Payer: Self-pay | Admitting: Hematology and Oncology

## 2019-01-30 NOTE — Telephone Encounter (Signed)
I talk with patient regarding schedule  

## 2019-02-10 ENCOUNTER — Ambulatory Visit (INDEPENDENT_AMBULATORY_CARE_PROVIDER_SITE_OTHER): Payer: BC Managed Care – PPO | Admitting: Surgical

## 2019-02-10 ENCOUNTER — Encounter: Payer: Self-pay | Admitting: Surgical

## 2019-02-10 ENCOUNTER — Other Ambulatory Visit: Payer: Self-pay

## 2019-02-10 ENCOUNTER — Ambulatory Visit: Payer: BC Managed Care – PPO | Admitting: Surgical

## 2019-02-10 VITALS — BP 125/84 | HR 88 | Temp 97.8°F | Ht 60.0 in | Wt 261.0 lb

## 2019-02-10 DIAGNOSIS — Z9013 Acquired absence of bilateral breasts and nipples: Secondary | ICD-10-CM

## 2019-02-10 DIAGNOSIS — Z170 Estrogen receptor positive status [ER+]: Secondary | ICD-10-CM

## 2019-02-10 DIAGNOSIS — C50511 Malignant neoplasm of lower-outer quadrant of right female breast: Secondary | ICD-10-CM

## 2019-02-10 DIAGNOSIS — C50411 Malignant neoplasm of upper-outer quadrant of right female breast: Secondary | ICD-10-CM

## 2019-02-10 NOTE — Progress Notes (Signed)
Subjective:     Patient ID: Adriana Moon, female    DOB: Jul 08, 1980, 38 y.o.   MRN: [id number]  Chief Complaint  Patient presents with   Follow-up    2 weeks on (B) breast reconstruction    HPI: The patient is a 38 y.o. female here for follow-up after bilateral breast reconstruction and placement of expanders and Flex HD.  She has had minimal improvement in her tenderness in her left chest wall/breast and lateral breast.  She reports that she is having difficulty with range of motion.  Pain is not increased with expander filling, Flexeril has been helping some.  She reports that she is taking Flexeril as needed and alternating with Valium as needed.  She has not been taking them at the same time but reports she will take the Flexeril in the morning and the Valium at night.  She denies any fever, chills, nausea, vomiting.  No erythema, swelling, seroma, hematoma noted.  She does not feel that she is able to return to work at this time because of this left breast/chest wall tenderness and decreased range of motion.  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    She tolerated last fill well. Incisions are healing well.  Review of Systems  Constitutional: Positive for activity change. Negative for appetite change, chills, diaphoresis, fatigue and fever.  Respiratory: Negative for shortness of breath.   Cardiovascular: Negative for chest pain, palpitations and leg swelling.  Gastrointestinal: Negative for abdominal pain, diarrhea, nausea and vomiting.  Musculoskeletal: Positive for myalgias (left breast/pectoralis).  Skin: Negative for color change, pallor, rash and wound.  Neurological: Positive for weakness (left side). Negative for dizziness and headaches.     Objective:   Vital Signs BP 125/84 (BP Location: Left Arm, Patient Position: Sitting, Cuff Size: Large)    Pulse 88    Temp 97.8 F (36.6 C) (Temporal)    Ht 5'  (1.524 m)    Wt 261 lb (118.4 kg)    LMP 01/09/2019 (Exact Date)    SpO2 100%    BMI 50.97 kg/m  Vital Signs and Nursing Note Reviewed Chaperone present Physical Exam  Constitutional: She is oriented to person, place, and time and well-developed, well-nourished, and in no distress.  HENT:  Head: Normocephalic and atraumatic.  Cardiovascular: Normal rate.  Pulmonary/Chest: Effort normal.    Musculoskeletal: Normal range of motion.  Neurological: She is alert and oriented to person, place, and time. Gait normal.  Skin: Skin is warm and dry. No rash noted. She is not diaphoretic. No erythema. No pallor.  Psychiatric: Mood and affect normal.      Assessment/Plan:     ICD-10-CM   1. Malignant neoplasm of upper-outer quadrant of right breast in female, estrogen receptor positive (HCC)  C50.411 PT eval and treat   Z17.0   2. Acquired absence of both breasts  Z90.13   3. Malignant neoplasm of lower-outer quadrant of right breast of female, estrogen receptor positive ([place])  C50.511    Z17.0    Mrs. Dehnel is healing really well. Explained to her the importance of not taking valium and flexeril at the same time and she understood. She reports that the valium has been helping her more and she has only 2 left and will need a prescription for after fills. Prescription sent to pharmacy. She understands and agrees that she should not drive while taking valium as it can cause sleepiness and decreased  reaction time.   PT eval and treat sent for evaluation by PT. If cleared by PT, patient can return to work. She has no restrictions at this time as she is 2 months post-op.  We placed injectable saline in the Expander using a sterile technique: Right:50cc for a total of450/ 535cc Left:50 cc for a total of450/ 535cc  Follow up in 2 weeks. Call if she does not hear from PT in a week.  Carola Rhine Kadan Millstein, PA-C 02/10/2019, 4:15 PM

## 2019-02-12 ENCOUNTER — Other Ambulatory Visit: Payer: Self-pay | Admitting: Surgical

## 2019-02-16 ENCOUNTER — Ambulatory Visit: Payer: BC Managed Care – PPO | Admitting: Obstetrics & Gynecology

## 2019-02-17 ENCOUNTER — Other Ambulatory Visit: Payer: Self-pay | Admitting: Family

## 2019-02-17 DIAGNOSIS — K219 Gastro-esophageal reflux disease without esophagitis: Secondary | ICD-10-CM

## 2019-02-20 DIAGNOSIS — M25611 Stiffness of right shoulder, not elsewhere classified: Secondary | ICD-10-CM | POA: Diagnosis not present

## 2019-02-20 DIAGNOSIS — C50411 Malignant neoplasm of upper-outer quadrant of right female breast: Secondary | ICD-10-CM | POA: Diagnosis not present

## 2019-02-20 DIAGNOSIS — M799 Soft tissue disorder, unspecified: Secondary | ICD-10-CM | POA: Diagnosis not present

## 2019-02-20 DIAGNOSIS — M25612 Stiffness of left shoulder, not elsewhere classified: Secondary | ICD-10-CM | POA: Diagnosis not present

## 2019-02-23 ENCOUNTER — Telehealth: Payer: Self-pay

## 2019-02-23 NOTE — Telephone Encounter (Signed)

## 2019-02-24 ENCOUNTER — Ambulatory Visit (INDEPENDENT_AMBULATORY_CARE_PROVIDER_SITE_OTHER): Payer: BC Managed Care – PPO | Admitting: Surgical

## 2019-02-24 ENCOUNTER — Other Ambulatory Visit: Payer: Self-pay

## 2019-02-24 ENCOUNTER — Encounter: Payer: Self-pay | Admitting: Surgical

## 2019-02-24 VITALS — BP 131/84 | HR 69 | Temp 97.8°F | Ht 60.0 in | Wt 261.8 lb

## 2019-02-24 DIAGNOSIS — C50511 Malignant neoplasm of lower-outer quadrant of right female breast: Secondary | ICD-10-CM

## 2019-02-24 DIAGNOSIS — Z170 Estrogen receptor positive status [ER+]: Secondary | ICD-10-CM

## 2019-02-24 DIAGNOSIS — Z9013 Acquired absence of bilateral breasts and nipples: Secondary | ICD-10-CM

## 2019-02-24 DIAGNOSIS — C50411 Malignant neoplasm of upper-outer quadrant of right female breast: Secondary | ICD-10-CM

## 2019-02-24 NOTE — Progress Notes (Signed)
Subjective:     Patient ID: Adriana Moon, female    DOB: Aug 12, 1980, 38 y.o.   MRN: [id number]  Chief Complaint  Patient presents with  . Follow-up    2 weeks    HPI: The patient is a 38 y.o. female here for follow-up after bilateral breast reconstruction and placement of expanders and Flex HD.  Since her last visit, she has had physical therapy eval and treatment. She reports PT went well, but she has had only one visit so far. She is scheduled to go tomorrow. She continues to have breast pressure in the AM upon wakening that she describes as something sitting on her chest, but this resolves quickly on the right side but the left axilla continues to hurt throughout the day.  Her incisions are healing well. The skin over the R breast is beginning to get tight, and this may limit [name] with additional fills. Incisions are all c/d/i, no dehiscence noted. No sign of seroma, hematoma, swelling noted. No fevers, chills, n/v.  Denies fever, chills, n/v. She does not feel as if she is ready to return to work due to her decreased ROM and difficulty with use of her L arm.   Review of Systems  Constitutional: Positive for activity change. Negative for appetite change, chills, diaphoresis, fatigue and fever.  Respiratory: Negative.   Cardiovascular: Negative.   Gastrointestinal: Negative for nausea and vomiting.  Musculoskeletal: Positive for myalgias.  Skin: Positive for color change. Negative for pallor, rash and wound.  Neurological: Negative for dizziness and headaches.     Objective:   Vital Signs BP 131/84 (BP Location: Left Arm, Patient Position: Sitting, Cuff Size: Large)   Pulse 69   Temp 97.8 F (36.6 C) (Temporal)   Ht 5' (1.524 m)   Wt 261 lb 12.8 oz (118.8 kg)   LMP 02/22/2019 (Exact Date)   SpO2 98%   BMI 51.13 kg/m  Vital Signs and Nursing Note Reviewed Chaperone present Physical Exam  Constitutional: She is oriented to person, place, and time and  well-developed, well-nourished, and in no distress.  HENT:  Head: Normocephalic and atraumatic.  Cardiovascular: Normal rate.  Pulmonary/Chest: Effort normal. Right breast exhibits skin change (post-radiation) and tenderness. Left breast exhibits tenderness.    Musculoskeletal: Normal range of motion.  Neurological: She is alert and oriented to person, place, and time. Gait normal.  Skin: Skin is warm and dry. No rash noted. She is not diaphoretic. No erythema. No pallor.  Psychiatric: Mood and affect normal.    Assessment/Plan:     ICD-10-CM   1. Malignant neoplasm of upper-outer quadrant of right breast in female, estrogen receptor positive ([place])  C50.411    Z17.0   2. Acquired absence of both breasts  Z90.13   3. Malignant neoplasm of lower-outer quadrant of right breast of female, estrogen receptor positive ([place])  C50.511    Z17.0     Adriana Moon is overall doing well. She has healed well. Due to radiation on the right breast, we may be limited as far as increasing expander size much more. The skin over the right breast is fairly lax, but it getting tight enough to warrant some caution with filling more than her current size of 470cc. She understands risks of filling too much on right side due to radiation (wound development, dehiscence, poor healing after implant placement).  She is currently working with PT for assistance with decreased ROM and axillary pain. At this time, i'd expect her pain  to be improving, but will give it some time for PT to provide some relief with exercises.  We placed injectable saline in the Expander using a sterile technique: Right:20cc for a total of470/ 535cc Left:20cc for a total of470/ 535cc  Pictures were obtained of the patient and placed in the chart with the patient's or guardian's permission.  Follow up in 2 weeks.  Adriana Rhine Nainika Newlun, PA-C 02/24/2019, 3:46 PM

## 2019-02-25 DIAGNOSIS — M25612 Stiffness of left shoulder, not elsewhere classified: Secondary | ICD-10-CM | POA: Diagnosis not present

## 2019-02-25 DIAGNOSIS — M799 Soft tissue disorder, unspecified: Secondary | ICD-10-CM | POA: Diagnosis not present

## 2019-02-25 DIAGNOSIS — M25611 Stiffness of right shoulder, not elsewhere classified: Secondary | ICD-10-CM | POA: Diagnosis not present

## 2019-02-25 DIAGNOSIS — C50411 Malignant neoplasm of upper-outer quadrant of right female breast: Secondary | ICD-10-CM | POA: Diagnosis not present

## 2019-02-27 DIAGNOSIS — M799 Soft tissue disorder, unspecified: Secondary | ICD-10-CM | POA: Diagnosis not present

## 2019-02-27 DIAGNOSIS — M25612 Stiffness of left shoulder, not elsewhere classified: Secondary | ICD-10-CM | POA: Diagnosis not present

## 2019-02-27 DIAGNOSIS — C50411 Malignant neoplasm of upper-outer quadrant of right female breast: Secondary | ICD-10-CM | POA: Diagnosis not present

## 2019-02-27 DIAGNOSIS — M25611 Stiffness of right shoulder, not elsewhere classified: Secondary | ICD-10-CM | POA: Diagnosis not present

## 2019-03-03 DIAGNOSIS — Z6841 Body Mass Index (BMI) 40.0 and over, adult: Secondary | ICD-10-CM | POA: Diagnosis not present

## 2019-03-03 DIAGNOSIS — Z113 Encounter for screening for infections with a predominantly sexual mode of transmission: Secondary | ICD-10-CM | POA: Diagnosis not present

## 2019-03-03 DIAGNOSIS — M25612 Stiffness of left shoulder, not elsewhere classified: Secondary | ICD-10-CM | POA: Diagnosis not present

## 2019-03-03 DIAGNOSIS — C50411 Malignant neoplasm of upper-outer quadrant of right female breast: Secondary | ICD-10-CM | POA: Diagnosis not present

## 2019-03-03 DIAGNOSIS — M25611 Stiffness of right shoulder, not elsewhere classified: Secondary | ICD-10-CM | POA: Diagnosis not present

## 2019-03-03 DIAGNOSIS — N898 Other specified noninflammatory disorders of vagina: Secondary | ICD-10-CM | POA: Diagnosis not present

## 2019-03-03 DIAGNOSIS — M799 Soft tissue disorder, unspecified: Secondary | ICD-10-CM | POA: Diagnosis not present

## 2019-03-05 ENCOUNTER — Telehealth: Payer: Self-pay | Admitting: Plastic Surgery

## 2019-03-05 ENCOUNTER — Encounter: Payer: Self-pay | Admitting: Plastic Surgery

## 2019-03-05 DIAGNOSIS — M799 Soft tissue disorder, unspecified: Secondary | ICD-10-CM | POA: Diagnosis not present

## 2019-03-05 DIAGNOSIS — C50411 Malignant neoplasm of upper-outer quadrant of right female breast: Secondary | ICD-10-CM | POA: Diagnosis not present

## 2019-03-05 DIAGNOSIS — M25611 Stiffness of right shoulder, not elsewhere classified: Secondary | ICD-10-CM | POA: Diagnosis not present

## 2019-03-05 DIAGNOSIS — M25612 Stiffness of left shoulder, not elsewhere classified: Secondary | ICD-10-CM | POA: Diagnosis not present

## 2019-03-05 NOTE — Telephone Encounter (Signed)
Patient called to inquire about what had been sent to her disability company, she states she has not been paid in 2 weeks because we had not submitted current documentation.   I reached out to her disability company [phone], they advised they were awaiting her RTW note from her employer which had not been received. Patient is still currently out of work while she attends PT sessions. Disability requested a work note as well as OV notes to be faxed [phone].  I spoke with Catalina Antigua about patients situation, he recommends that the patient RTW 03/16/2019 with restrictions since the nature of her job requires the use of the muscle that is causing her pain. I have drafted letter for [place] to sign along with current OV notes

## 2019-03-10 ENCOUNTER — Other Ambulatory Visit: Payer: Self-pay

## 2019-03-10 ENCOUNTER — Encounter: Payer: Self-pay | Admitting: Surgical

## 2019-03-10 ENCOUNTER — Ambulatory Visit (INDEPENDENT_AMBULATORY_CARE_PROVIDER_SITE_OTHER): Payer: BC Managed Care – PPO | Admitting: Surgical

## 2019-03-10 VITALS — BP 128/83 | HR 80 | Temp 97.1°F | Ht 60.0 in | Wt 262.2 lb

## 2019-03-10 DIAGNOSIS — M799 Soft tissue disorder, unspecified: Secondary | ICD-10-CM | POA: Diagnosis not present

## 2019-03-10 DIAGNOSIS — C50411 Malignant neoplasm of upper-outer quadrant of right female breast: Secondary | ICD-10-CM

## 2019-03-10 DIAGNOSIS — Z6841 Body Mass Index (BMI) 40.0 and over, adult: Secondary | ICD-10-CM

## 2019-03-10 DIAGNOSIS — M25611 Stiffness of right shoulder, not elsewhere classified: Secondary | ICD-10-CM | POA: Diagnosis not present

## 2019-03-10 DIAGNOSIS — M25612 Stiffness of left shoulder, not elsewhere classified: Secondary | ICD-10-CM | POA: Diagnosis not present

## 2019-03-10 DIAGNOSIS — Z9013 Acquired absence of bilateral breasts and nipples: Secondary | ICD-10-CM

## 2019-03-10 DIAGNOSIS — Z170 Estrogen receptor positive status [ER+]: Secondary | ICD-10-CM

## 2019-03-10 NOTE — Progress Notes (Signed)
Subjective:     Patient ID: Adriana Moon, female    DOB: [birth year], 38 y.o.   MRN: [id number]  Chief Complaint  Patient presents with  . Follow-up    2 weeks    HPI: The patient is a 38 y.o. female here for follow-up on her bilateral breast reconstruction.  She has a history of radiation on the right side.  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  She is pleased with the current size of her expanders and would like to exchange her implants.  She has not had any fevers, chills, nausea, vomiting.  We are not going to fill today because the right breast tissue/skin over the expander is very thin.  There is no open wounds at this time.  She has significant radiation damage on the right breast near the inframammary fold.  Review of Systems  Constitutional: Positive for activity change. Negative for appetite change, chills, diaphoresis, fatigue and fever.  Respiratory: Positive for chest tightness (Due to expanders, noncardiac).   Cardiovascular: Negative for chest pain.  Gastrointestinal: Negative for diarrhea, nausea and vomiting.  Musculoskeletal: Positive for myalgias.  Skin: Positive for color change (Radiation damage). Negative for pallor, rash and wound.  Neurological: Negative for dizziness and weakness.   Objective:   Vital Signs BP 128/83 (BP Location: Left Arm, Patient Position: Sitting, Cuff Size: Large)   Pulse 80   Temp (!) 97.1 F (36.2 C) (Temporal)   Ht 5' (1.524 m)   Wt 262 lb 3.2 oz (118.9 kg)   LMP 02/22/2019 (Exact Date)   SpO2 100%   BMI 51.21 kg/m  Vital Signs and Nursing Note  Reviewed Chaperone present Physical Exam  Constitutional: She is oriented to person, place, and time and well-developed, well-nourished, and in no distress.  HENT:  Head: Normocephalic and atraumatic.  Cardiovascular: Normal rate.  Pulmonary/Chest: Effort normal.    Musculoskeletal: Normal range of motion.        General: No tenderness, deformity or edema.  Neurological: She is alert and oriented to person, place, and time. Gait normal.  Skin: Skin is warm and dry. No rash noted. She is not diaphoretic. No erythema. No pallor.  Psychiatric: Mood and affect normal.   Assessment/Plan:     ICD-10-CM   1. Malignant neoplasm of upper-outer quadrant of right breast in female, estrogen receptor positive ([place])  C50.411    Z17.0   2. Acquired absence of both breasts  Z90.13   3. BMI 45.0-49.9, adult [facility])  [phone]    Adriana Moon is overall healing well.  She still having a lot of pain and difficulty with range of motion.  She reports that she is working with PT 2 times a week which has been very helpful, but she has not been doing any heavy lifting yet with PT.  She is going to return to work on 30 November with restrictions, 2 more weeks of physical therapy.  We placed injectable saline in the Expander using a sterile technique: Right: 0 cc for a total of 470 /  535 cc Left: 0 cc for a total of 470 / 535 cc  She is at her maximum fill size, will plan for exchange surgery pending surgical schedule.  Adriana Moon Adriana Baillargeon, PA-C 03/10/2019, 5:00 PM

## 2019-03-11 ENCOUNTER — Encounter: Payer: Self-pay | Admitting: Plastic Surgery

## 2019-03-12 DIAGNOSIS — M799 Soft tissue disorder, unspecified: Secondary | ICD-10-CM | POA: Diagnosis not present

## 2019-03-12 DIAGNOSIS — M25611 Stiffness of right shoulder, not elsewhere classified: Secondary | ICD-10-CM | POA: Diagnosis not present

## 2019-03-12 DIAGNOSIS — M25612 Stiffness of left shoulder, not elsewhere classified: Secondary | ICD-10-CM | POA: Diagnosis not present

## 2019-03-12 DIAGNOSIS — C50411 Malignant neoplasm of upper-outer quadrant of right female breast: Secondary | ICD-10-CM | POA: Diagnosis not present

## 2019-03-16 DIAGNOSIS — I83813 Varicose veins of bilateral lower extremities with pain: Secondary | ICD-10-CM | POA: Diagnosis not present

## 2019-03-16 DIAGNOSIS — I83893 Varicose veins of bilateral lower extremities with other complications: Secondary | ICD-10-CM | POA: Diagnosis not present

## 2019-03-17 DIAGNOSIS — I83813 Varicose veins of bilateral lower extremities with pain: Secondary | ICD-10-CM | POA: Diagnosis not present

## 2019-03-17 DIAGNOSIS — I83893 Varicose veins of bilateral lower extremities with other complications: Secondary | ICD-10-CM | POA: Diagnosis not present

## 2019-03-18 DIAGNOSIS — I8312 Varicose veins of left lower extremity with inflammation: Secondary | ICD-10-CM | POA: Diagnosis not present

## 2019-03-18 DIAGNOSIS — I8311 Varicose veins of right lower extremity with inflammation: Secondary | ICD-10-CM | POA: Diagnosis not present

## 2019-03-18 DIAGNOSIS — I83813 Varicose veins of bilateral lower extremities with pain: Secondary | ICD-10-CM | POA: Diagnosis not present

## 2019-03-18 DIAGNOSIS — I83893 Varicose veins of bilateral lower extremities with other complications: Secondary | ICD-10-CM | POA: Diagnosis not present

## 2019-03-23 ENCOUNTER — Encounter: Payer: Self-pay | Admitting: Plastic Surgery

## 2019-03-27 DIAGNOSIS — M722 Plantar fascial fibromatosis: Secondary | ICD-10-CM | POA: Diagnosis not present

## 2019-03-27 DIAGNOSIS — M71572 Other bursitis, not elsewhere classified, left ankle and foot: Secondary | ICD-10-CM | POA: Diagnosis not present

## 2019-03-27 DIAGNOSIS — M71571 Other bursitis, not elsewhere classified, right ankle and foot: Secondary | ICD-10-CM | POA: Diagnosis not present

## 2019-03-29 ENCOUNTER — Other Ambulatory Visit: Payer: Self-pay | Admitting: Family Medicine

## 2019-03-29 DIAGNOSIS — K219 Gastro-esophageal reflux disease without esophagitis: Secondary | ICD-10-CM

## 2019-03-30 NOTE — Telephone Encounter (Signed)
Rakes. NTBS 30 days given 02/17/19 

## 2019-04-02 ENCOUNTER — Encounter: Payer: Self-pay | Admitting: Surgical

## 2019-04-02 ENCOUNTER — Other Ambulatory Visit: Payer: Self-pay

## 2019-04-02 ENCOUNTER — Inpatient Hospital Stay (HOSPITAL_COMMUNITY): Admission: RE | Admit: 2019-04-02 | Payer: BC Managed Care – PPO | Source: Ambulatory Visit

## 2019-04-02 ENCOUNTER — Ambulatory Visit (INDEPENDENT_AMBULATORY_CARE_PROVIDER_SITE_OTHER): Payer: BC Managed Care – PPO | Admitting: Surgical

## 2019-04-02 VITALS — BP 120/86 | HR 64 | Temp 97.3°F | Ht 60.0 in | Wt 258.0 lb

## 2019-04-02 DIAGNOSIS — Z9013 Acquired absence of bilateral breasts and nipples: Secondary | ICD-10-CM

## 2019-04-02 DIAGNOSIS — C50411 Malignant neoplasm of upper-outer quadrant of right female breast: Secondary | ICD-10-CM

## 2019-04-02 DIAGNOSIS — Z6841 Body Mass Index (BMI) 40.0 and over, adult: Secondary | ICD-10-CM

## 2019-04-02 DIAGNOSIS — Z170 Estrogen receptor positive status [ER+]: Secondary | ICD-10-CM

## 2019-04-02 MED ORDER — CEPHALEXIN 500 MG PO CAPS: 500.0000 mg | ORAL_CAPSULE | Freq: Four times a day (QID) | ORAL | 0 refills | Status: AC

## 2019-04-02 MED ORDER — HYDROCODONE-ACETAMINOPHEN 5-325 MG PO TABS: 1.0000 | ORAL_TABLET | Freq: Four times a day (QID) | ORAL | 0 refills | Status: AC | PRN

## 2019-04-02 NOTE — Progress Notes (Signed)
Patient ID: Adriana Moon, female    DOB: 09-11-1980, 38 y.o.   MRN: [id number]  Chief Complaint  Patient presents with   Pre-op Exam    (B) removal of breast expanders and placement of silicone implants      ICD-10-CM   1. Malignant neoplasm of upper-outer quadrant of right breast in female, estrogen receptor positive ([place])  C50.411    Z17.0   2. Acquired absence of both breasts  Z90.13   3. BMI 45.0-49.9, adult [facility])  Z68.42     History of Present Illness: Adriana Moon is a 38 y.o.  female  with a history of recurrent right breast cancer. She has a hx of radiation to right breast.  She also has a hx of bilateral breast reduction in 2016. She had bilateral mastectomies and immediate reconstruction on 8.31.20 by Dr. Ninfa Linden and Dr. Marla Roe  She presents for preoperative evaluation for upcoming procedure,  removal of bilateral tissue expanders with placement of bilateral breast implants with Dr. Marla Roe on 04/06/2019.  The patient has not had problems with anesthesia. Tolerated prior procedures without any side effects. Some post-op nausea, easily controlled with PRN zofran.  No hx of fmhx of blood clots.  No recent colds. No hx of mi, cva.  She previously had asthma/allergy issues, but reports she has not needed to use her inhaler in a very long time and she only uses fluticasone and zyrtec for allergies.  She reports her hx of CKD is related to a high creatinine lab previously drawn, but has not had any issues.  She is allergic to tolmetin, NSAID, shellfish, prednisone.  She is happy with current size of her expanders.  She has returned to work.  Past Medical History: Allergies: Allergies  Allergen Reactions   Prednisone     Lost taste in her mouth, also makes "me crazy & mean"    Tolmetin Hives and Swelling   Nsaids Hives and Swelling   Shellfish Allergy Hives and Swelling    Current Medications:  Current Outpatient  Medications:    albuterol (PROVENTIL HFA;VENTOLIN HFA) 108 (90 Base) MCG/ACT inhaler, Inhale 2 puffs into the lungs every 6 (six) hours as needed for wheezing or shortness of breath., Disp: 1 Inhaler, Rfl: 2   cetirizine (ZYRTEC) 10 MG tablet, Take 1-2 tablets 1-2 times a day as needed, Disp: 180 tablet, Rfl: 3   Cranberry-Vit C-Probiotic-Ca (CRANBERRY PLUS PROBIOTIC PO), Take 1 capsule by mouth daily., Disp: , Rfl:    cyclobenzaprine (FLEXERIL) 10 MG tablet, Take 1 tablet (10 mg total) by mouth 2 (two) times daily., Disp: 30 tablet, Rfl: 0   diazepam (VALIUM) 2 MG tablet, TAKE 1 TABLET BY MOUTH EVERY 12 HOURS AS NEEDED FOR ANXIETY, Disp: 20 tablet, Rfl: 0   famotidine (PEPCID) 20 MG tablet, Take 20 mg by mouth daily as needed for heartburn or indigestion., Disp: , Rfl:    fluconazole (DIFLUCAN) 150 MG tablet, Take 1 now and repeat 1 in 3 days then 1 every month before period, Disp: 6 tablet, Rfl: 2   fluticasone (FLONASE) 50 MCG/ACT nasal spray, Place 2 sprays into both nostrils daily., Disp: 16 g, Rfl: 6   fluticasone furoate-vilanterol (BREO ELLIPTA) 100-25 MCG/INH AEPB, Inhale 1 puff into the lungs daily. (Patient taking differently: Inhale 1 puff into the lungs daily as needed (shortness of breath or wheezing). ), Disp: 1 each, Rfl: 2   hydrOXYzine (ATARAX/VISTARIL) 10 MG tablet, Take 1 tablet (10 mg total) by  mouth 3 (three) times daily as needed. Take 1 10 mg tablet tid prn anxiety, can take 2 at HS if needed, Disp: 30 tablet, Rfl: 1   hydrOXYzine (ATARAX/VISTARIL) 25 MG tablet, Take 50 mg by mouth every 6 (six) hours as needed for anxiety or itching., Disp: , Rfl:    metFORMIN (GLUCOPHAGE) 500 MG tablet, Take 1 tablet by mouth 2 (two) times daily., Disp: , Rfl:    montelukast (SINGULAIR) 10 MG tablet, TAKE 1 TABLET BY MOUTH AT BEDTIME, Disp: 30 tablet, Rfl: 0   omeprazole (PRILOSEC) 40 MG capsule, TAKE 1 CAPSULE BY MOUTH ONCE DAILY** NEEDS  TO BE SEEN  BEFORE  NEXT  REFILL**,  Disp: 30 capsule, Rfl: 0   ondansetron (ZOFRAN) 4 MG tablet, Take 1 tablet (4 mg total) by mouth every 8 (eight) hours as needed for nausea or vomiting., Disp: 20 tablet, Rfl: 0   phentermine (ADIPEX-P) 37.5 MG tablet, Take 37.5 mg by mouth daily., Disp: , Rfl:    Spacer/Aero Chamber Mouthpiece MISC, 1 each by Does not apply route every 6 (six) hours as needed., Disp: 1 each, Rfl: 0   tamoxifen (NOLVADEX) 20 MG tablet, Take 1 tablet (20 mg total) by mouth daily., Disp: 90 tablet, Rfl: 3  Past Medical Problems: Past Medical History:  Diagnosis Date   Angio-edema    Anxiety    manages naturally   Arthritis of both knees    Asthma    Breast cancer ([place])    Breast disorder    cancer   Depression    pt. reports that she tends to get down & has had Lexapro in the past but its been taken off her list for now.    Family history of breast cancer    Family history of kidney cancer    Family history of lung cancer    Family history of prostate cancer    GERD (gastroesophageal reflux disease)    Migraines    Urticaria     Past Surgical History: Past Surgical History:  Procedure Laterality Date   BREAST LUMPECTOMY Right 2009   BREAST RECONSTRUCTION WITH PLACEMENT OF TISSUE EXPANDER AND FLEX HD (ACELLULAR HYDRATED DERMIS) Bilateral 12/22/2018   Procedure: BILATERAL BREAST RECONSTRUCTION WITH PLACEMENT OF TISSUE EXPANDER AND FLEX HD (ACELLULAR HYDRATED DERMIS);  Surgeon: [facility] Going, DO;  Location: [facility];  Service: Plastics;  Laterality: Bilateral;   BREAST REDUCTION SURGERY Bilateral 01/25/2015   Procedure: MAMMARY REDUCTION  (BREAST);  Surgeon: [name], MD;  Location: [facility];  Service: Plastics;  Laterality: Bilateral;   CESAREAN SECTION     x2  2002 & 2006   ECTOPIC PREGNANCY SURGERY  06/2018   R side , done at [facility] Bilateral 12/22/2018   with tissue  expanders   MASTECTOMY W/ SENTINEL NODE BIOPSY Bilateral 12/22/2018   Procedure: BILATERAL MASTECTOMIES WITH RIGHT SENTINEL LYMPH NODE BIOPSY;  Surgeon: Coralie Keens, MD;  Location: [facility];  Service: General;  Laterality: Bilateral;   REDUCTION MAMMAPLASTY Left    RT lumpectomy      Social History: Social History   Socioeconomic History   Marital status: Legally Separated    Spouse name: Not on file   Number of children: 2   Years of education: Not on file   Highest education level: Not on file  Occupational History   Not on file  Tobacco Use   Smoking  status: Never Smoker   Smokeless tobacco: Never Used  Substance and Sexual Activity   Alcohol use: Yes    Alcohol/week: 0.0 standard drinks    Comment: occ. - socially   Drug use: No   Sexual activity: Yes    Birth control/protection: None, Surgical  Other Topics Concern   Not on file  Social History Narrative   Not on file   Social Determinants of Health   Financial Resource Strain:    Difficulty of Paying Living Expenses: Not on file  Food Insecurity:    Worried About [place] in the Last Year: Not on file   Ran Out of Food in the Last Year: Not on file  Transportation Needs:    Lack of Transportation (Medical): Not on file   Lack of Transportation (Non-Medical): Not on file  Physical Activity:    Days of Exercise per Week: Not on file   Minutes of Exercise per Session: Not on file  Stress:    Feeling of Stress : Not on file  Social Connections:    Frequency of Communication with Friends and Family: Not on file   Frequency of Social Gatherings with Friends and Family: Not on file   Attends Religious Services: Not on file   Active Member of Clubs or Organizations: Not on file   Attends Archivist Meetings: Not on file   Marital Status: Not on file  Intimate Partner Violence:    Fear of Current or Ex-Partner: Not on file   Emotionally Abused: Not on file    Physically Abused: Not on file   Sexually Abused: Not on file    Family History: Family History  Problem Relation Age of Onset   Diabetes Father    Hypertension Father    Gout Father    Cancer Paternal Grandmother    Breast cancer Paternal Grandmother        late 32s   Emphysema Maternal Grandfather    Lung cancer Maternal Grandfather        45s   Prostate cancer Maternal Uncle        late 25s   Kidney cancer Other 29       4th degree paternal relative    Review of Systems: Review of Systems  Constitutional: Negative for chills, diaphoresis, fever and malaise/fatigue.  Respiratory: Negative for cough, shortness of breath and wheezing.   Cardiovascular: Negative.   Gastrointestinal: Negative for nausea and vomiting.  Genitourinary: Negative.   Musculoskeletal: Positive for myalgias.  Skin: Negative for itching and rash.  Neurological: Positive for sensory change and headaches. Negative for dizziness and weakness.    Physical Exam: Vital Signs BP 120/86 (BP Location: Left Arm, Patient Position: Sitting, Cuff Size: Large)    Pulse 64    Temp (!) 97.3 F (36.3 C) (Temporal)    Ht 5' (1.524 m)    Wt 258 lb (117 kg)    LMP 02/26/2019 (Approximate)    SpO2 99%    BMI 50.39 kg/m  Physical Exam Exam conducted with a chaperone present.  Constitutional:      General: She is not in acute distress.    Appearance: Normal appearance. She is not ill-appearing.  HENT:     Head: Normocephalic and atraumatic.  Eyes:     Pupils: Pupils are equal, round Neck:     Musculoskeletal: Normal range of motion.  Cardiovascular:     Rate and Rhythm: Normal rate and regular rhythm.     Pulses:  Normal pulses.     Heart sounds: Normal heart sounds. No murmur.  Pulmonary:     Effort: Pulmonary effort is normal. No respiratory distress.     Breath sounds: Normal breath sounds. No wheezing.  Breast:  Right and left breast incisions c/d/i, well healed. Right breast skin is thin over  expander. Radiation damage of right breast/IMF noted. No sign of infection. No erythema. Abdominal:     General: Abdomen is flat. There is no distension.     Palpations: Abdomen is soft.     Tenderness: There is no abdominal tenderness.  Musculoskeletal: Normal range of motion.  Skin:    General: Skin is warm and dry.     Findings: No erythema or rash.  Neurological:     General: No focal deficit present.     Mental Status: She is alert and oriented to person, place, and time. Mental status is at baseline.     Motor: No weakness.  Psychiatric:        Mood and Affect: Mood normal.        Behavior: Behavior normal.    Assessment/Plan: The patient is scheduled for removal of bilateral tissue expanders with placement of bilateral breast implants with Dr. Marla Roe on 04/06/2019.  Risks, benefits, and alternatives of procedure discussed, questions answered and consent obtained.    The risks that can be encountered with and after placement of a breast implant were discussed and include the following but not limited to these: bleeding, infection, delayed healing, anesthesia risks, skin sensation changes, injury to structures including nerves, blood vessels, and muscles which may be temporary or permanent, allergies to tape, suture materials and glues, blood products, topical preparations or injected agents, skin contour irregularities, skin discoloration and swelling, deep vein thrombosis, cardiac and pulmonary complications, pain, which may persist, fluid accumulation, wrinkling of the skin over the implanmt, changes in nipple or breast sensation, implant leakage or rupture, faulty position of the implant, persistent pain, formation of tight scar tissue around the implant (capsular contracture).  Rx sent to pharmacy COVID test scheduled  Currently has 470/535 cc in each expander.    Electronically signed by: Carola Rhine Jariyah Hackley, PA-C 04/02/2019 11:23 AM

## 2019-04-02 NOTE — H&P (View-Only) (Signed)
Patient ID: Adriana Moon, female    DOB: 04/23/1981, 38 y.o.   MRN: [id number]  Chief Complaint  Patient presents with  . Pre-op Exam    (B) removal of breast expanders and placement of silicone implants      ICD-10-CM   1. Malignant neoplasm of upper-outer quadrant of right breast in female, estrogen receptor positive ([place])  C50.411    Z17.0   2. Acquired absence of both breasts  Z90.13   3. BMI 45.0-49.9, adult [facility])  Z68.42     History of Present Illness: Adriana Moon is a 38 y.o.  female  with a history of recurrent right breast cancer. She has a hx of radiation to right breast.  She also has a hx of bilateral breast reduction in 2016. She had bilateral mastectomies and immediate reconstruction on 8.31.20 by Dr. Ninfa Linden and Dr. Marla Roe  She presents for preoperative evaluation for upcoming procedure,  removal of bilateral tissue expanders with placement of bilateral breast implants with Dr. Marla Roe on 04/06/2019.  The patient has not had problems with anesthesia. Tolerated prior procedures without any side effects. Some post-op nausea, easily controlled with PRN zofran.  No hx of fmhx of blood clots.  No recent colds. No hx of mi, cva.  She previously had asthma/allergy issues, but reports she has not needed to use her inhaler in a very long time and she only uses fluticasone and zyrtec for allergies.  She reports her hx of CKD is related to a high creatinine lab previously drawn, but has not had any issues.  She is allergic to tolmetin, NSAID, shellfish, prednisone.  She is happy with current size of her expanders.  She has returned to work.  Past Medical History: Allergies: Allergies  Allergen Reactions  . Prednisone     Lost taste in her mouth, also makes "me crazy & mean"   . Tolmetin Hives and Swelling  . Nsaids Hives and Swelling  . Shellfish Allergy Hives and Swelling    Current Medications:  Current Outpatient  Medications:  .  albuterol (PROVENTIL HFA;VENTOLIN HFA) 108 (90 Base) MCG/ACT inhaler, Inhale 2 puffs into the lungs every 6 (six) hours as needed for wheezing or shortness of breath., Disp: 1 Inhaler, Rfl: 2 .  cetirizine (ZYRTEC) 10 MG tablet, Take 1-2 tablets 1-2 times a day as needed, Disp: 180 tablet, Rfl: 3 .  Cranberry-Vit C-Probiotic-Ca (CRANBERRY PLUS PROBIOTIC PO), Take 1 capsule by mouth daily., Disp: , Rfl:  .  cyclobenzaprine (FLEXERIL) 10 MG tablet, Take 1 tablet (10 mg total) by mouth 2 (two) times daily., Disp: 30 tablet, Rfl: 0 .  diazepam (VALIUM) 2 MG tablet, TAKE 1 TABLET BY MOUTH EVERY 12 HOURS AS NEEDED FOR ANXIETY, Disp: 20 tablet, Rfl: 0 .  famotidine (PEPCID) 20 MG tablet, Take 20 mg by mouth daily as needed for heartburn or indigestion., Disp: , Rfl:  .  fluconazole (DIFLUCAN) 150 MG tablet, Take 1 now and repeat 1 in 3 days then 1 every month before period, Disp: 6 tablet, Rfl: 2 .  fluticasone (FLONASE) 50 MCG/ACT nasal spray, Place 2 sprays into both nostrils daily., Disp: 16 g, Rfl: 6 .  fluticasone furoate-vilanterol (BREO ELLIPTA) 100-25 MCG/INH AEPB, Inhale 1 puff into the lungs daily. (Patient taking differently: Inhale 1 puff into the lungs daily as needed (shortness of breath or wheezing). ), Disp: 1 each, Rfl: 2 .  hydrOXYzine (ATARAX/VISTARIL) 10 MG tablet, Take 1 tablet (10 mg total) by  mouth 3 (three) times daily as needed. Take 1 10 mg tablet tid prn anxiety, can take 2 at HS if needed, Disp: 30 tablet, Rfl: 1 .  hydrOXYzine (ATARAX/VISTARIL) 25 MG tablet, Take 50 mg by mouth every 6 (six) hours as needed for anxiety or itching., Disp: , Rfl:  .  metFORMIN (GLUCOPHAGE) 500 MG tablet, Take 1 tablet by mouth 2 (two) times daily., Disp: , Rfl:  .  montelukast (SINGULAIR) 10 MG tablet, TAKE 1 TABLET BY MOUTH AT BEDTIME, Disp: 30 tablet, Rfl: 0 .  omeprazole (PRILOSEC) 40 MG capsule, TAKE 1 CAPSULE BY MOUTH ONCE DAILY** NEEDS  TO BE SEEN  BEFORE  NEXT  REFILL**,  Disp: 30 capsule, Rfl: 0 .  ondansetron (ZOFRAN) 4 MG tablet, Take 1 tablet (4 mg total) by mouth every 8 (eight) hours as needed for nausea or vomiting., Disp: 20 tablet, Rfl: 0 .  phentermine (ADIPEX-P) 37.5 MG tablet, Take 37.5 mg by mouth daily., Disp: , Rfl:  .  Spacer/Aero Chamber Mouthpiece MISC, 1 each by Does not apply route every 6 (six) hours as needed., Disp: 1 each, Rfl: 0 .  tamoxifen (NOLVADEX) 20 MG tablet, Take 1 tablet (20 mg total) by mouth daily., Disp: 90 tablet, Rfl: 3  Past Medical Problems: Past Medical History:  Diagnosis Date  . Angio-edema   . Anxiety    manages naturally  . Arthritis of both knees   . Asthma   . Breast cancer ([place])   . Breast disorder    cancer  . Depression    pt. reports that she tends to get down & has had Lexapro in the past but its been taken off her list for now.   . Family history of breast cancer   . Family history of kidney cancer   . Family history of lung cancer   . Family history of prostate cancer   . GERD (gastroesophageal reflux disease)   . Migraines   . Urticaria     Past Surgical History: Past Surgical History:  Procedure Laterality Date  . BREAST LUMPECTOMY Right 2009  . BREAST RECONSTRUCTION WITH PLACEMENT OF TISSUE EXPANDER AND FLEX HD (ACELLULAR HYDRATED DERMIS) Bilateral 12/22/2018   Procedure: BILATERAL BREAST RECONSTRUCTION WITH PLACEMENT OF TISSUE EXPANDER AND FLEX HD (ACELLULAR HYDRATED DERMIS);  Surgeon: Wallace Going, DO;  Location: [place];  Service: Plastics;  Laterality: Bilateral;  . BREAST REDUCTION SURGERY Bilateral 01/25/2015   Procedure: MAMMARY REDUCTION  (BREAST);  Surgeon: [name], MD;  Location: [place];  Service: Plastics;  Laterality: Bilateral;  . CESAREAN SECTION     x2  2002 & 2006  . ECTOPIC PREGNANCY SURGERY  06/2018   R side , done at [place]    . MASTECTOMY W/ SENTINEL NODE BIOPSY Bilateral 12/22/2018   with tissue  expanders  . MASTECTOMY W/ SENTINEL NODE BIOPSY Bilateral 12/22/2018   Procedure: BILATERAL MASTECTOMIES WITH RIGHT SENTINEL LYMPH NODE BIOPSY;  Surgeon: Coralie Keens, MD;  Location: [place];  Service: General;  Laterality: Bilateral;  . REDUCTION MAMMAPLASTY Left   . RT lumpectomy      Social History: Social History   Socioeconomic History  . Marital status: Legally Separated    Spouse name: Not on file  . Number of children: 2  . Years of education: Not on file  . Highest education level: Not on file  Occupational History  . Not on file  Tobacco Use  . Smoking  status: Never Smoker  . Smokeless tobacco: Never Used  Substance and Sexual Activity  . Alcohol use: Yes    Alcohol/week: 0.0 standard drinks    Comment: occ. - socially  . Drug use: No  . Sexual activity: Yes    Birth control/protection: None, Surgical  Other Topics Concern  . Not on file  Social History Narrative  . Not on file   Social Determinants of Health   Financial Resource Strain:   . Difficulty of Paying Living Expenses: Not on file  Food Insecurity:   . Worried About Charity fundraiser in the Last Year: Not on file  . Ran Out of Food in the Last Year: Not on file  Transportation Needs:   . Lack of Transportation (Medical): Not on file  . Lack of Transportation (Non-Medical): Not on file  Physical Activity:   . Days of Exercise per Week: Not on file  . Minutes of Exercise per Session: Not on file  Stress:   . Feeling of Stress : Not on file  Social Connections:   . Frequency of Communication with Friends and Family: Not on file  . Frequency of Social Gatherings with Friends and Family: Not on file  . Attends Religious Services: Not on file  . Active Member of Clubs or Organizations: Not on file  . Attends Archivist Meetings: Not on file  . Marital Status: Not on file  Intimate Partner Violence:   . Fear of Current or Ex-Partner: Not on file  . Emotionally Abused: Not on file  .  Physically Abused: Not on file  . Sexually Abused: Not on file    Family History: Family History  Problem Relation Age of Onset  . Diabetes Father   . Hypertension Father   . Gout Father   . Cancer Paternal Grandmother   . Breast cancer Paternal Grandmother        late 53s  . Emphysema Maternal Grandfather   . Lung cancer Maternal Grandfather        28s  . Prostate cancer Maternal Uncle        late 75s  . Kidney cancer Other 51       4th degree paternal relative    Review of Systems: Review of Systems  Constitutional: Negative for chills, diaphoresis, fever and malaise/fatigue.  Respiratory: Negative for cough, shortness of breath and wheezing.   Cardiovascular: Negative.   Gastrointestinal: Negative for nausea and vomiting.  Genitourinary: Negative.   Musculoskeletal: Positive for myalgias.  Skin: Negative for itching and rash.  Neurological: Positive for sensory change and headaches. Negative for dizziness and weakness.    Physical Exam: Vital Signs BP 120/86 (BP Location: Left Arm, Patient Position: Sitting, Cuff Size: Large)   Pulse 64   Temp (!) 97.3 F (36.3 C) (Temporal)   Ht 5' (1.524 m)   Wt 258 lb (117 kg)   LMP 02/26/2019 (Approximate)   SpO2 99%   BMI 50.39 kg/m  Physical Exam Exam conducted with a chaperone present.  Constitutional:      General: She is not in acute distress.    Appearance: Normal appearance. She is not ill-appearing.  HENT:     Head: Normocephalic and atraumatic.  Eyes:     Pupils: Pupils are equal, round Neck:     Musculoskeletal: Normal range of motion.  Cardiovascular:     Rate and Rhythm: Normal rate and regular rhythm.     Pulses: Normal pulses.     Heart  sounds: Normal heart sounds. No murmur.  Pulmonary:     Effort: Pulmonary effort is normal. No respiratory distress.     Breath sounds: Normal breath sounds. No wheezing.  Breast:  Right and left breast incisions c/d/i, well healed. Right breast skin is thin over  expander. Radiation damage of right breast/IMF noted. No sign of infection. No erythema. Abdominal:     General: Abdomen is flat. There is no distension.     Palpations: Abdomen is soft.     Tenderness: There is no abdominal tenderness.  Musculoskeletal: Normal range of motion.  Skin:    General: Skin is warm and dry.     Findings: No erythema or rash.  Neurological:     General: No focal deficit present.     Mental Status: She is alert and oriented to person, place, and time. Mental status is at baseline.     Motor: No weakness.  Psychiatric:        Mood and Affect: Mood normal.        Behavior: Behavior normal.    Assessment/Plan: The patient is scheduled for removal of bilateral tissue expanders with placement of bilateral breast implants with Dr. Marla Roe on 04/06/2019.  Risks, benefits, and alternatives of procedure discussed, questions answered and consent obtained.    The risks that can be encountered with and after placement of a breast implant were discussed and include the following but not limited to these: bleeding, infection, delayed healing, anesthesia risks, skin sensation changes, injury to structures including nerves, blood vessels, and muscles which may be temporary or permanent, allergies to tape, suture materials and glues, blood products, topical preparations or injected agents, skin contour irregularities, skin discoloration and swelling, deep vein thrombosis, cardiac and pulmonary complications, pain, which may persist, fluid accumulation, wrinkling of the skin over the implanmt, changes in nipple or breast sensation, implant leakage or rupture, faulty position of the implant, persistent pain, formation of tight scar tissue around the implant (capsular contracture).  Rx sent to pharmacy COVID test scheduled  Currently has 470/535 cc in each expander.    Electronically signed by: Carola Rhine Quintavius Niebuhr, PA-C 04/02/2019 11:23 AM

## 2019-04-03 ENCOUNTER — Other Ambulatory Visit (HOSPITAL_COMMUNITY)
Admission: RE | Admit: 2019-04-03 | Discharge: 2019-04-03 | Disposition: A | Discharge status: Home / Self Care | Payer: BC Managed Care – PPO | Source: Ambulatory Visit | Attending: Plastic Surgery | Admitting: Plastic Surgery

## 2019-04-03 ENCOUNTER — Encounter (HOSPITAL_COMMUNITY): Payer: Self-pay | Admitting: Plastic Surgery

## 2019-04-03 ENCOUNTER — Other Ambulatory Visit: Payer: Self-pay

## 2019-04-03 DIAGNOSIS — Z6841 Body Mass Index (BMI) 40.0 and over, adult: Secondary | ICD-10-CM | POA: Diagnosis not present

## 2019-04-03 DIAGNOSIS — Z01812 Encounter for preprocedural laboratory examination: Secondary | ICD-10-CM | POA: Diagnosis not present

## 2019-04-03 DIAGNOSIS — Z20828 Contact with and (suspected) exposure to other viral communicable diseases: Secondary | ICD-10-CM | POA: Insufficient documentation

## 2019-04-03 DIAGNOSIS — N898 Other specified noninflammatory disorders of vagina: Secondary | ICD-10-CM | POA: Diagnosis not present

## 2019-04-03 LAB — SARS CORONAVIRUS 2 (TAT 6-24 HRS): SARS Coronavirus 2: NEGATIVE

## 2019-04-06 ENCOUNTER — Other Ambulatory Visit: Payer: Self-pay

## 2019-04-06 ENCOUNTER — Ambulatory Visit (HOSPITAL_COMMUNITY): Payer: BC Managed Care – PPO | Admitting: Physician Assistant

## 2019-04-06 ENCOUNTER — Ambulatory Visit (HOSPITAL_COMMUNITY)
Admission: RE | Admit: 2019-04-06 | Discharge: 2019-04-06 | Disposition: A | Discharge status: Home / Self Care | Payer: BC Managed Care – PPO | Attending: Plastic Surgery | Admitting: Plastic Surgery

## 2019-04-06 ENCOUNTER — Encounter (HOSPITAL_COMMUNITY): Payer: Self-pay | Admitting: Plastic Surgery

## 2019-04-06 ENCOUNTER — Encounter (HOSPITAL_COMMUNITY)
Admission: RE | Disposition: A | Discharge status: Home / Self Care | Payer: Self-pay | Source: Home / Self Care | Attending: Plastic Surgery

## 2019-04-06 DIAGNOSIS — Z853 Personal history of malignant neoplasm of breast: Secondary | ICD-10-CM | POA: Diagnosis not present

## 2019-04-06 DIAGNOSIS — C50411 Malignant neoplasm of upper-outer quadrant of right female breast: Secondary | ICD-10-CM | POA: Diagnosis not present

## 2019-04-06 DIAGNOSIS — F419 Anxiety disorder, unspecified: Secondary | ICD-10-CM | POA: Diagnosis not present

## 2019-04-06 DIAGNOSIS — Z45812 Encounter for adjustment or removal of left breast implant: Secondary | ICD-10-CM | POA: Diagnosis not present

## 2019-04-06 DIAGNOSIS — K219 Gastro-esophageal reflux disease without esophagitis: Secondary | ICD-10-CM | POA: Diagnosis not present

## 2019-04-06 DIAGNOSIS — Z45811 Encounter for adjustment or removal of right breast implant: Secondary | ICD-10-CM | POA: Diagnosis not present

## 2019-04-06 DIAGNOSIS — Z421 Encounter for breast reconstruction following mastectomy: Secondary | ICD-10-CM | POA: Diagnosis not present

## 2019-04-06 DIAGNOSIS — Z170 Estrogen receptor positive status [ER+]: Secondary | ICD-10-CM | POA: Insufficient documentation

## 2019-04-06 DIAGNOSIS — Z7951 Long term (current) use of inhaled steroids: Secondary | ICD-10-CM | POA: Insufficient documentation

## 2019-04-06 DIAGNOSIS — Z79899 Other long term (current) drug therapy: Secondary | ICD-10-CM | POA: Insufficient documentation

## 2019-04-06 DIAGNOSIS — Z6841 Body Mass Index (BMI) 40.0 and over, adult: Secondary | ICD-10-CM | POA: Insufficient documentation

## 2019-04-06 DIAGNOSIS — Z9013 Acquired absence of bilateral breasts and nipples: Secondary | ICD-10-CM | POA: Diagnosis not present

## 2019-04-06 DIAGNOSIS — Z79810 Long term (current) use of selective estrogen receptor modulators (SERMs): Secondary | ICD-10-CM | POA: Diagnosis not present

## 2019-04-06 DIAGNOSIS — J45909 Unspecified asthma, uncomplicated: Secondary | ICD-10-CM | POA: Diagnosis not present

## 2019-04-06 HISTORY — PX: REMOVAL OF BILATERAL TISSUE EXPANDERS WITH PLACEMENT OF BILATERAL BREAST IMPLANTS: SHX6431

## 2019-04-06 LAB — HEMOGLOBIN: Hemoglobin: 12.9 g/dL (ref 12.0–15.0)

## 2019-04-06 LAB — POCT PREGNANCY, URINE: Preg Test, Ur: NEGATIVE

## 2019-04-06 SURGERY — REMOVAL, TISSUE EXPANDER, BREAST, BILATERAL, WITH BILATERAL IMPLANT IMPLANT INSERTION
Anesthesia: General | Site: Breast | Laterality: Bilateral

## 2019-04-06 MED ORDER — FENTANYL CITRATE (PF) 100 MCG/2ML IJ SOLN
25.0000 ug | INTRAMUSCULAR | Status: DC | PRN
  Administered 2019-04-06 (×2): 50 ug via INTRAVENOUS

## 2019-04-06 MED ORDER — PROPOFOL 10 MG/ML IV BOLUS
INTRAVENOUS | Status: AC
  Filled 2019-04-06: qty 20

## 2019-04-06 MED ORDER — FENTANYL CITRATE (PF) 100 MCG/2ML IJ SOLN
INTRAMUSCULAR | Status: DC | PRN
  Administered 2019-04-06: 100 ug via INTRAVENOUS
  Administered 2019-04-06 (×2): 50 ug via INTRAVENOUS

## 2019-04-06 MED ORDER — GLYCOPYRROLATE PF 0.2 MG/ML IJ SOSY
PREFILLED_SYRINGE | INTRAMUSCULAR | Status: DC | PRN
  Administered 2019-04-06: .1 mg via INTRAVENOUS

## 2019-04-06 MED ORDER — LACTATED RINGERS IV SOLN: INTRAVENOUS | Status: DC

## 2019-04-06 MED ORDER — HYDROCODONE-ACETAMINOPHEN 5-325 MG PO TABS: 1.0000 | ORAL_TABLET | Freq: Four times a day (QID) | ORAL | 0 refills | Status: DC | PRN

## 2019-04-06 MED ORDER — MIDAZOLAM HCL 2 MG/2ML IJ SOLN
INTRAMUSCULAR | Status: AC
  Filled 2019-04-06: qty 2

## 2019-04-06 MED ORDER — CEFAZOLIN SODIUM-DEXTROSE 2-4 GM/100ML-% IV SOLN
2.0000 g | INTRAVENOUS | Status: AC
  Administered 2019-04-06: 2 g via INTRAVENOUS
  Filled 2019-04-06: qty 100

## 2019-04-06 MED ORDER — SODIUM CHLORIDE 0.9 % IV SOLN
INTRAVENOUS | Status: DC | PRN
  Administered 2019-04-06: 500 mL

## 2019-04-06 MED ORDER — STERILE WATER FOR IRRIGATION IR SOLN
Status: DC | PRN
  Administered 2019-04-06: 1000 mL

## 2019-04-06 MED ORDER — SODIUM CHLORIDE 0.9 % IV SOLN
INTRAVENOUS | Status: AC
  Filled 2019-04-06: qty 500000

## 2019-04-06 MED ORDER — SODIUM CHLORIDE 0.9% FLUSH: 3.0000 mL | INTRAVENOUS | Status: DC | PRN

## 2019-04-06 MED ORDER — OXYCODONE HCL 5 MG PO TABS
ORAL_TABLET | ORAL | Status: AC
  Filled 2019-04-06: qty 1

## 2019-04-06 MED ORDER — LIDOCAINE-EPINEPHRINE 1 %-1:100000 IJ SOLN
INTRAMUSCULAR | Status: AC
  Filled 2019-04-06: qty 1

## 2019-04-06 MED ORDER — SUGAMMADEX SODIUM 500 MG/5ML IV SOLN
INTRAVENOUS | Status: DC | PRN
  Administered 2019-04-06: 470 mg via INTRAVENOUS

## 2019-04-06 MED ORDER — METOCLOPRAMIDE HCL 5 MG/ML IJ SOLN
10.0000 mg | Freq: Once | INTRAMUSCULAR | Status: AC | PRN
  Administered 2019-04-06: 10 mg via INTRAVENOUS

## 2019-04-06 MED ORDER — METOCLOPRAMIDE HCL 5 MG/ML IJ SOLN
INTRAMUSCULAR | Status: AC
  Filled 2019-04-06: qty 2

## 2019-04-06 MED ORDER — LACTATED RINGERS IV SOLN
INTRAVENOUS | Status: DC
  Administered 2019-04-06: 09:00:00 via INTRAVENOUS

## 2019-04-06 MED ORDER — FENTANYL CITRATE (PF) 100 MCG/2ML IJ SOLN
INTRAMUSCULAR | Status: AC
  Filled 2019-04-06: qty 2

## 2019-04-06 MED ORDER — LACTATED RINGERS IV SOLN
INTRAVENOUS | Status: DC | PRN
  Administered 2019-04-06 (×2): via INTRAVENOUS

## 2019-04-06 MED ORDER — FENTANYL CITRATE (PF) 100 MCG/2ML IJ SOLN: 25.0000 ug | INTRAMUSCULAR | Status: DC | PRN

## 2019-04-06 MED ORDER — FENTANYL CITRATE (PF) 250 MCG/5ML IJ SOLN
INTRAMUSCULAR | Status: AC
  Filled 2019-04-06: qty 5

## 2019-04-06 MED ORDER — ROCURONIUM BROMIDE 50 MG/5ML IV SOSY
PREFILLED_SYRINGE | INTRAVENOUS | Status: DC | PRN
  Administered 2019-04-06: 100 mg via INTRAVENOUS

## 2019-04-06 MED ORDER — ONDANSETRON HCL 4 MG/2ML IJ SOLN
INTRAMUSCULAR | Status: DC | PRN
  Administered 2019-04-06: 4 mg via INTRAVENOUS

## 2019-04-06 MED ORDER — OXYCODONE HCL 5 MG PO TABS
5.0000 mg | ORAL_TABLET | ORAL | Status: DC | PRN
  Administered 2019-04-06: 5 mg via ORAL

## 2019-04-06 MED ORDER — CHLORHEXIDINE GLUCONATE CLOTH 2 % EX PADS: 6.0000 | MEDICATED_PAD | Freq: Once | CUTANEOUS | Status: DC

## 2019-04-06 MED ORDER — DIAZEPAM 2 MG PO TABS: 2.0000 mg | ORAL_TABLET | Freq: Two times a day (BID) | ORAL | 0 refills | Status: DC | PRN

## 2019-04-06 MED ORDER — ACETAMINOPHEN 650 MG RE SUPP: 650.0000 mg | RECTAL | Status: DC | PRN

## 2019-04-06 MED ORDER — ACETAMINOPHEN 325 MG PO TABS: 650.0000 mg | ORAL_TABLET | ORAL | Status: DC | PRN

## 2019-04-06 MED ORDER — SODIUM CHLORIDE 0.9 % IV SOLN: 250.0000 mL | INTRAVENOUS | Status: DC | PRN

## 2019-04-06 MED ORDER — LIDOCAINE 2% (20 MG/ML) 5 ML SYRINGE
INTRAMUSCULAR | Status: DC | PRN
  Administered 2019-04-06: 100 mg via INTRAVENOUS

## 2019-04-06 MED ORDER — SODIUM CHLORIDE 0.9% FLUSH: 3.0000 mL | Freq: Two times a day (BID) | INTRAVENOUS | Status: DC

## 2019-04-06 MED ORDER — MEPERIDINE HCL 25 MG/ML IJ SOLN: 6.2500 mg | INTRAMUSCULAR | Status: DC | PRN

## 2019-04-06 MED ORDER — 0.9 % SODIUM CHLORIDE (POUR BTL) OPTIME
TOPICAL | Status: DC | PRN
  Administered 2019-04-06: 1000 mL

## 2019-04-06 MED ORDER — LIDOCAINE-EPINEPHRINE 1 %-1:100000 IJ SOLN
INTRAMUSCULAR | Status: DC | PRN
  Administered 2019-04-06: 13.5 mL

## 2019-04-06 MED ORDER — PROPOFOL 10 MG/ML IV BOLUS
INTRAVENOUS | Status: DC | PRN
  Administered 2019-04-06: 200 mg via INTRAVENOUS

## 2019-04-06 MED ORDER — MIDAZOLAM HCL 5 MG/5ML IJ SOLN
INTRAMUSCULAR | Status: DC | PRN
  Administered 2019-04-06: 2 mg via INTRAVENOUS

## 2019-04-06 SURGICAL SUPPLY — 50 items
BAG DECANTER FOR FLEXI CONT (MISCELLANEOUS) ×2 IMPLANT
BINDER BREAST XLRG (GAUZE/BANDAGES/DRESSINGS) IMPLANT
BINDER BREAST XXLRG (GAUZE/BANDAGES/DRESSINGS) ×2 IMPLANT
BIOPATCH RED 1 DISK 7.0 (GAUZE/BANDAGES/DRESSINGS) ×2 IMPLANT
CANISTER SUCT 3000ML PPV (MISCELLANEOUS) ×2 IMPLANT
CHLORAPREP W/TINT 26 (MISCELLANEOUS) ×2 IMPLANT
COVER SURGICAL LIGHT HANDLE (MISCELLANEOUS) ×2 IMPLANT
COVER WAND RF STERILE (DRAPES) IMPLANT
DERMABOND ADVANCED (GAUZE/BANDAGES/DRESSINGS) ×3
DERMABOND ADVANCED .7 DNX12 (GAUZE/BANDAGES/DRESSINGS) ×3 IMPLANT
DRAIN CHANNEL 19F RND (DRAIN) ×2 IMPLANT
DRAPE HALF SHEET 40X57 (DRAPES) ×4 IMPLANT
DRAPE ORTHO SPLIT 77X108 STRL (DRAPES) ×2
DRAPE SURG 17X23 STRL (DRAPES) ×8 IMPLANT
DRAPE SURG ORHT 6 SPLT 77X108 (DRAPES) ×2 IMPLANT
DRAPE WARM FLUID 44X44 (DRAPES) IMPLANT
DRSG PAD ABDOMINAL 8X10 ST (GAUZE/BANDAGES/DRESSINGS) ×4 IMPLANT
ELECT BLADE 4.0 EZ CLEAN MEGAD (MISCELLANEOUS) ×2
ELECT REM PT RETURN 9FT ADLT (ELECTROSURGICAL) ×2
ELECTRODE BLDE 4.0 EZ CLN MEGD (MISCELLANEOUS) ×1 IMPLANT
ELECTRODE REM PT RTRN 9FT ADLT (ELECTROSURGICAL) ×1 IMPLANT
EVACUATOR SILICONE 100CC (DRAIN) IMPLANT
GAUZE SPONGE 4X4 12PLY STRL (GAUZE/BANDAGES/DRESSINGS) ×2 IMPLANT
GLOVE BIO SURGEON STRL SZ 6.5 (GLOVE) ×6 IMPLANT
GOWN STRL REUS W/ TWL LRG LVL3 (GOWN DISPOSABLE) ×2 IMPLANT
GOWN STRL REUS W/TWL LRG LVL3 (GOWN DISPOSABLE) ×2
IMPL BREAST GEL 535CC (Breast) ×2 IMPLANT
IMPLANT BREAST GEL 535CC (Breast) ×4 IMPLANT
KIT BASIN OR (CUSTOM PROCEDURE TRAY) ×2 IMPLANT
KIT TURNOVER KIT B (KITS) ×2 IMPLANT
NS IRRIG 1000ML POUR BTL (IV SOLUTION) ×4 IMPLANT
PACK GENERAL/GYN (CUSTOM PROCEDURE TRAY) ×2 IMPLANT
PAD ARMBOARD 7.5X6 YLW CONV (MISCELLANEOUS) ×2 IMPLANT
PIN SAFETY STERILE (MISCELLANEOUS) IMPLANT
SET ASEPTIC TRANSFER (MISCELLANEOUS) IMPLANT
SIZER BREAST REUSE 535CC (SIZER) ×1
SIZER BRST REUSE ULT HI 535CC (SIZER) ×1 IMPLANT
SLEEVE SUCTION 125 (MISCELLANEOUS) ×2 IMPLANT
STAPLER VISISTAT 35W (STAPLE) IMPLANT
SUT MNCRL AB 3-0 PS2 18 (SUTURE) ×4 IMPLANT
SUT MNCRL AB 4-0 PS2 18 (SUTURE) ×4 IMPLANT
SUT MON AB 5-0 PS2 18 (SUTURE) ×4 IMPLANT
SUT PDS AB 2-0 CT1 27 (SUTURE) IMPLANT
SUT SILK 3 0 SH 30 (SUTURE) IMPLANT
SUT VIC AB 3-0 SH 27 (SUTURE)
SUT VIC AB 3-0 SH 27X BRD (SUTURE) IMPLANT
SUT VICRYL 4-0 PS2 18IN ABS (SUTURE) IMPLANT
TOWEL GREEN STERILE (TOWEL DISPOSABLE) ×2 IMPLANT
TOWEL GREEN STERILE FF (TOWEL DISPOSABLE) ×2 IMPLANT
TRAY FOLEY MTR SLVR 14FR STAT (SET/KITS/TRAYS/PACK) IMPLANT

## 2019-04-06 NOTE — Op Note (Addendum)
Op report Bilateral Exchange   DATE OF OPERATION: 04/06/2019  LOCATION: [place]  SURGICAL DIVISION: Plastic Surgery  PREOPERATIVE DIAGNOSES:  1. History of breast cancer.  2. Acquired absence of bilateral breast.   POSTOPERATIVE DIAGNOSES:  1. History of breast cancer.  2. Acquired absence of bilateral breast.   PROCEDURE:  1. Bilateral exchange of tissue expanders for implants.  2. Bilateral capsulotomies for implant respositioning.  SURGEON: Raider Valbuena Sanger Terren Jandreau, DO  ASSISTANT: Phoebe Sharps, PA  ANESTHESIA:  General.   COMPLICATIONS: None.   IMPLANTS: Left - Mentor Smooth Round High Profile Gel 535cc. Ref [id number].  Serial Number [id number] - Mentor Smooth Round High Profile Gel 535cc. Ref [id number].  Serial Number [id number]  INDICATIONS FOR PROCEDURE:  The patient, Adriana Moon, is a 38 y.o. female born on 1981/02/09, is here for treatment after bilateral mastectomies.  She had tissue expanders placed at the time of mastectomies. She now presents for exchange of her expanders for implants.  She requires capsulotomies to better position the implants. MRN: [id number]  CONSENT:  Informed consent was obtained directly from the patient. Risks, benefits and alternatives were fully discussed. Specific risks including but not limited to bleeding, infection, hematoma, seroma, scarring, pain, implant infection, implant extrusion, capsular contracture, asymmetry, wound healing problems, and need for further surgery were all discussed. The patient did have an ample opportunity to have her questions answered to her satisfaction.   DESCRIPTION OF PROCEDURE:  The patient was taken to the operating room. SCDs were placed and IV antibiotics were given. The patient's chest was prepped and draped in a sterile fashion. A time out was performed and the implants to be used were identified.    On the right breast: One percent Lidocaine with  epinephrine was used to infiltrate at the incision site. The old mastectomy scar was incised lateral to the vertical limb.  The mastectomy flaps from the superior and inferior flaps were raised over the pectoralis major muscle for several centimeters to minimize tension for the closure. The pectoralis was split inferior to the skin incision to expose and remove the tissue expander.  Inspection of the pocket showed a normal healthy capsule and good integration of the biologic matrix.  The pocket was irrigated with antibiotic solution.  Circumferential capsulotomies were performed to allow for breast pocket expansion.  Measurements were made and a sizer used to confirm adequate pocket size for the implant dimensions.  Hemostasis was ensured with electrocautery. New gloves were placed. The implant was soaked in antibiotic solution and then placed in the pocket and oriented appropriately. The pectoralis major muscle and capsule on the anterior surface were re-closed with a 3-0 Monocryl suture. The remaining skin was closed with 4-0 Monocryl deep dermal and 5-0 Monocryl subcuticular stitches.   On the left breast: The old mastectomy scar was incised at the inframammary fold lateral to the vertical limb.  The mastectomy flaps from the superior and inferior flaps were raised over the pectoralis major muscle for several centimeters to minimize tension for the closure. The pectoralis was split inferior to the skin incision to expose and remove the tissue expander.  Inspection of the pocket showed a normal healthy capsule and good integration of the biologic matrix.   Circumferential capsulotomies were performed to allow for breast pocket expansion.  Measurements were made and a sizer utilized to confirm adequate pocket size for the implant dimensions.  Hemostasis was ensured with the electrocautery.  New gloves were applied. The implant  was soaked in antibiotic solution and placed in the pocket and oriented  appropriately. The pectoralis major muscle and capsule on the anterior surface were re-closed with a 3-0 Monocryl suture. The remaining skin was closed with 4-0 Monocryl deep dermal and 5-0 Monocryl subcuticular stitches.  Dermabond was applied to the incision site. A breast binder and ABDs were placed.  The patient was allowed to wake from anesthesia and taken to the recovery room in satisfactory condition.   The advanced practice practitioner (APP) assisted throughout the case.  The APP was essential in retraction and counter traction when needed to make the case progress smoothly.  This retraction and assistance made it possible to see the tissue plans for the procedure.  The assistance was needed for blood control, tissue re-approximation and assisted with closure of the incision site.

## 2019-04-06 NOTE — Anesthesia Preprocedure Evaluation (Addendum)
Anesthesia Evaluation  Patient identified by MRN, date of birth, ID band Patient awake    Reviewed: Allergy & Precautions, NPO status , Patient's Chart, lab work & pertinent test results  Airway Mallampati: II  TM Distance: >3 FB Neck ROM: Full    Dental no notable dental hx.    Pulmonary asthma ,    Pulmonary exam normal breath sounds clear to auscultation       Cardiovascular negative cardio ROS Normal cardiovascular exam Rhythm:Regular Rate:Normal     Neuro/Psych  Headaches, PSYCHIATRIC DISORDERS Anxiety Depression    GI/Hepatic Neg liver ROS, GERD  Medicated and Controlled,  Endo/Other  Morbid obesity ([place])  Renal/GU negative Renal ROS     Musculoskeletal negative musculoskeletal ROS (+)   Abdominal (+) + obese,   Peds  Hematology negative hematology ROS (+)   Anesthesia Other Findings RIGHT BREAST CANCER  Reproductive/Obstetrics                             Anesthesia Physical  Anesthesia Plan  ASA: IV  Anesthesia Plan: General   Post-op Pain Management:    Induction: Intravenous  PONV Risk Score and Plan: 3 and Ondansetron, Dexamethasone, Midazolam and Treatment may vary due to age or medical condition  Airway Management Planned: Oral ETT  Additional Equipment:   Intra-op Plan:   Post-operative Plan: Extubation in OR  Informed Consent: I have reviewed the patients History and Physical, chart, labs and discussed the procedure including the risks, benefits and alternatives for the proposed anesthesia with the patient or authorized representative who has indicated his/her understanding and acceptance.     Dental advisory given  Plan Discussed with: CRNA  Anesthesia Plan Comments:         Anesthesia Quick Evaluation

## 2019-04-06 NOTE — Anesthesia Procedure Notes (Signed)
Procedure Name: Intubation Date/Time: 04/06/2019 9:56 AM Performed by: Neldon Newport, CRNA Pre-anesthesia Checklist: Timeout performed, Patient being monitored, Suction available, Emergency Drugs available and Patient identified Patient Re-evaluated:Patient Re-evaluated prior to induction Oxygen Delivery Method: Circle system utilized Preoxygenation: Pre-oxygenation with 100% oxygen Induction Type: IV induction Ventilation: Mask ventilation without difficulty and Oral airway inserted - appropriate to patient size Laryngoscope Size: Mac and 3 Grade View: Grade I Tube type: Oral Tube size: 7.0 mm Number of attempts: 1 Placement Confirmation: breath sounds checked- equal and bilateral,  positive ETCO2 and ETT inserted through vocal cords under direct vision Secured at: 21 cm Tube secured with: Tape Dental Injury: Teeth and Oropharynx as per pre-operative assessment

## 2019-04-06 NOTE — Progress Notes (Signed)
Wasted 50 mcg Fentanyl with Corrie Dandy

## 2019-04-06 NOTE — Transfer of Care (Signed)
Immediate Anesthesia Transfer of Care Note  Patient: Madlene Hulslander  Procedure(s) Performed: REMOVAL OF BILATERAL TISSUE EXPANDERS WITH PLACEMENT OF BILATERAL BREAST IMPLANTS (Bilateral Breast)  Patient Location: PACU  Anesthesia Type:General  Level of Consciousness: awake, alert  and oriented  Airway & Oxygen Therapy: Patient Spontanous Breathing and Patient connected to face mask oxygen  Post-op Assessment: Report given to RN, Post -op Vital signs reviewed and stable and Patient moving all extremities X 4  Post vital signs: Reviewed and stable  Last Vitals:  Vitals Value Taken Time  BP 125/73 04/06/19 1154  Temp    Pulse 92 04/06/19 1155  Resp 25 04/06/19 1155  SpO2 100 % 04/06/19 1155  Vitals shown include unvalidated device data.  Last Pain:  Vitals:   04/06/19 0828  PainSc: 0-No pain         Complications: No apparent anesthesia complications

## 2019-04-06 NOTE — Anesthesia Postprocedure Evaluation (Signed)
Anesthesia Post Note  Patient: Adriana Moon  Procedure(s) Performed: REMOVAL OF BILATERAL TISSUE EXPANDERS WITH PLACEMENT OF BILATERAL BREAST IMPLANTS (Bilateral Breast)     Patient location during evaluation: PACU Anesthesia Type: General Level of consciousness: awake and alert and oriented Pain management: pain level controlled Vital Signs Assessment: post-procedure vital signs reviewed and stable Respiratory status: spontaneous breathing, nonlabored ventilation and respiratory function stable Cardiovascular status: blood pressure returned to baseline Postop Assessment: no apparent nausea or vomiting Anesthetic complications: no    Last Vitals:  Vitals:   04/06/19 1224 04/06/19 1302  BP: 124/66 138/70  Pulse: 89   Resp: (!) 21   Temp:  (!) 36.1 C  SpO2: 100% 100%    Last Pain:  Vitals:   04/06/19 1302  PainSc: Tynan Chistian Kasler

## 2019-04-06 NOTE — Interval H&P Note (Signed)
History and Physical Interval Note:  04/06/2019 7:28 AM  Adriana Moon  has presented today for surgery, with the diagnosis of bilateral removal of breast expanders and placement of silicone implants.  The various methods of treatment have been discussed with the patient and family. After consideration of risks, benefits and other options for treatment, the patient has consented to  Procedure(s): REMOVAL OF BILATERAL TISSUE EXPANDERS WITH PLACEMENT OF BILATERAL BREAST IMPLANTS (Bilateral) as a surgical intervention.  The patient's history has been reviewed, patient examined, no change in status, stable for surgery.  I have reviewed the patient's chart and labs.  Questions were answered to the patient's satisfaction.     Adriana Moon

## 2019-04-06 NOTE — Discharge Instructions (Signed)
INSTRUCTIONS FOR AFTER BREAST SURGERY   You are getting ready to undergo breast surgery.  You will likely have some questions about what to expect following your operation.  The following information will help you and your family understand what to expect when you are discharged from the hospital.  Following these guidelines will help ensure a smooth recovery and reduce risks of complications.   Postoperative instructions include information on: diet, wound care, medications and physical activity.  AFTER SURGERY Expect to go home after the procedure.  In some cases, you may need to spend one night in the hospital for observation.  DIET Breast surgery does not require a specific diet.  However, the healthier you eat the better your body can start healing. It is important to increasing your protein intake.  This means limiting the foods with sugar and carbohydrates.  Focus on vegetables and some meat.  If you have any liposuction during your procedure be sure to drink water.  If your urine is bright yellow, then it is concentrated, and you need to drink more water.  As a general rule after surgery, you should have 8 ounces of water every hour while awake.  If you find you are persistently nauseated or unable to take in liquids let us know.  NO TOBACCO USE or EXPOSURE.  This will slow your healing process and increase the risk of a wound.  WOUND CARE If you don't have a drain:  You can shower the day after surgery. Use fragrance free soap.  Dial, [place] and [place] are usually mild on the skin. If you have steri-strips / tape directly attached to your skin leave them in place. It is OK to get these wet.  No baths, pools or hot tubs for two weeks. We close your incision to leave the smallest and best-looking scar. No ointment or creams on your incisions until given the go ahead.  Especially not Neosporin (Too many skin reactions with this one).  A few weeks after surgery you can use Mederma and start  massaging the scar. We ask you to wear your binder or sports bra for the first 6 weeks around the clock, including while sleeping. This provides added comfort and helps reduce the fluid accumulation at the surgery site.  ACTIVITY No heavy lifting until cleared by the doctor.  This usually means no more than a half-gallon of milk.  It is OK to walk and climb stairs. In fact, moving your legs is very important to decrease your risk of a blood clot.  It will also help keep you from getting deconditioned.  Every 1 to 2 hours get up and walk for 5 minutes. This will help with a quicker recovery back to normal.  Let pain be your guide so you don't do too much.  This is not the time for spring cleaning and don't plan on taking care of anyone else.  This time is for you to recover,  You will be more comfortable if you sleep and rest with your head elevated either with a few pillows under you or in a recliner.  No stomach sleeping for a three months.  WORK Everyone returns to work at different times. As a rough guide, most people take at least 1 - 2 weeks off prior to returning to work. If you need documentation for your job, bring the forms to your postoperative follow up visit.  DRIVING Arrange for someone to bring you home from the hospital.  You may be  able to drive a few days after surgery but not while taking any narcotics or valium.  BOWEL MOVEMENTS Constipation can occur after anesthesia and while taking pain medication.  It is important to stay ahead for your comfort.  We recommend taking Milk of Magnesia (2 tablespoons; twice a day) while taking the pain pills.  SEROMA This is fluid your body tried to put in the surgical site.  This is normal but if it creates tight skinny skin let us know.  It usually decreases in a few weeks.  MEDICATIONS and PAIN CONTROL At your preoperative visit for you history and physical you were given the following medications: 1. An antibiotic: Start this medication  when you get home and take according to the instructions on the bottle. 2. Zofran 4 mg:  This is to treat nausea and vomiting.  You can take this every 6 hours as needed and only if needed. 3. Valium 2 mg: This is for muscle tightness if you have an implant or expander. This will help relax your muscle which also helps with pain control.  This can be taken every 12 hours as needed.  Don't drive after taking this medication. 4. Norco (hydrocodone/acetaminophen) 5/325 mg:  This is only to be used after you have taken the motrin or the tylenol. Every 8 hours as needed. Over the counter Medication to take: 5. Ibuprofen (Motrin) 600 mg:  Take this every 6 hours.  If you have additional pain then take 500 mg of the tylenol.  Only take the Norco after you have tried these two. 6. Miralax or stool softener of choice: Take this according to the bottle if you take the [place] Call your surgeon's office if any of the following occur:  Fever 101 degrees F or greater  Excessive bleeding or fluid from the incision site.  Pain that increases over time without aid from the medications  Redness, warmth, or pus draining from incision sites  Persistent nausea or inability to take in liquids  Severe misshapen area that underwent the operation.

## 2019-04-07 ENCOUNTER — Encounter: Payer: Self-pay | Admitting: *Deleted

## 2019-04-08 ENCOUNTER — Ambulatory Visit (INDEPENDENT_AMBULATORY_CARE_PROVIDER_SITE_OTHER): Payer: Medicaid Other | Admitting: Family Medicine

## 2019-04-08 ENCOUNTER — Telehealth: Payer: Self-pay | Admitting: Adult Health

## 2019-04-08 ENCOUNTER — Encounter: Payer: Self-pay | Admitting: Family Medicine

## 2019-04-08 DIAGNOSIS — C50411 Malignant neoplasm of upper-outer quadrant of right female breast: Secondary | ICD-10-CM

## 2019-04-08 DIAGNOSIS — Z170 Estrogen receptor positive status [ER+]: Secondary | ICD-10-CM

## 2019-04-08 DIAGNOSIS — K219 Gastro-esophageal reflux disease without esophagitis: Secondary | ICD-10-CM

## 2019-04-08 MED ORDER — OMEPRAZOLE 40 MG PO CPDR: 40.0000 mg | DELAYED_RELEASE_CAPSULE | Freq: Every day | ORAL | 3 refills | Status: DC

## 2019-04-08 NOTE — Progress Notes (Signed)
Virtual Visit via telephone Note Due to COVID-19 pandemic this visit was conducted virtually. This visit type was conducted due to national recommendations for restrictions regarding the COVID-19 Pandemic (e.g. social distancing, sheltering in place) in an effort to limit this patient's exposure and mitigate transmission in our community. All issues noted in this document were discussed and addressed.  A physical exam was not performed with this format.   I connected with Adriana Moon on 04/08/2019 at [phone] by telephone and verified that I am speaking with the correct person using two identifiers. Adriana Moon is currently located at home and family is currently with them during visit. The provider, Monia Pouch, FNP is located in their office at time of visit.  I discussed the limitations, risks, security and privacy concerns of performing an evaluation and management service by telephone and the availability of in person appointments. I also discussed with the patient that there may be a patient responsible charge related to this service. The patient expressed understanding and agreed to proceed.  Subjective:  Patient ID: Adriana Moon, female    DOB: [birth year], 38 y.o.   MRN: [id number]  Chief Complaint:  Gastroesophageal Reflux   HPI: Adriana Moon is a 38 y.o. female presenting on 04/08/2019 for Gastroesophageal Reflux   Pt is following up today for a refill on her omeprazole. Pt states she has been doing great with the omeprazole and does not have any associated side effects. Pt is current receiving care for right breast cancer and recently had breast surgery. She is followed on a regular basis by oncology and OB/GYN.  Gastroesophageal Reflux She complains of heartburn. She reports no abdominal pain, no belching, no chest pain, no choking, no coughing, no dysphagia, no early satiety, no globus sensation, no hoarse voice, no  nausea, no sore throat, no stridor, no tooth decay, no water brash or no wheezing. This is a chronic problem. The current episode started more than 1 year ago. The problem occurs rarely. The problem has been resolved. The heartburn is located in the abdomen. The heartburn is of mild intensity. The heartburn does not wake her from sleep. The heartburn does not limit her activity. The heartburn doesn't change with position. The symptoms are aggravated by certain foods. Pertinent negatives include no anemia, fatigue, melena, muscle weakness, orthopnea or weight loss. She has tried a PPI for the symptoms. The treatment provided significant relief.     Relevant past medical, surgical, family, and social history reviewed and updated as indicated.  Allergies and medications reviewed and updated.   Past Medical History:  Diagnosis Date  . Angio-edema   . Anxiety    manages naturally  . Arthritis of both knees   . Asthma   . Breast cancer ([place])   . Breast disorder    cancer  . Depression    pt. reports that she tends to get down & has had Lexapro in the past but its been taken off her list for now.   . Family history of breast cancer   . Family history of kidney cancer   . Family history of lung cancer   . Family history of prostate cancer   . GERD (gastroesophageal reflux disease)   . Migraines   . Urticaria     Past Surgical History:  Procedure Laterality Date  . BREAST LUMPECTOMY Right 2009  . BREAST RECONSTRUCTION WITH PLACEMENT OF TISSUE EXPANDER AND FLEX HD (ACELLULAR HYDRATED DERMIS) Bilateral 12/22/2018   Procedure:  BILATERAL BREAST RECONSTRUCTION WITH PLACEMENT OF TISSUE EXPANDER AND FLEX HD (ACELLULAR HYDRATED DERMIS);  Surgeon: Wallace Going, DO;  Location: [facility];  Service: Plastics;  Laterality: Bilateral;  . BREAST REDUCTION SURGERY Bilateral 01/25/2015   Procedure: MAMMARY REDUCTION  (BREAST);  Surgeon: [name], MD;  Location: [facility];   Service: Plastics;  Laterality: Bilateral;  . CESAREAN SECTION     x2  2002 & 2006  . ECTOPIC PREGNANCY SURGERY  06/2018   R side , done at [facility]    . MASTECTOMY W/ SENTINEL NODE BIOPSY Bilateral 12/22/2018   with tissue expanders  . MASTECTOMY W/ SENTINEL NODE BIOPSY Bilateral 12/22/2018   Procedure: BILATERAL MASTECTOMIES WITH RIGHT SENTINEL LYMPH NODE BIOPSY;  Surgeon: Coralie Keens, MD;  Location: [facility];  Service: General;  Laterality: Bilateral;  . REDUCTION MAMMAPLASTY Left   . REMOVAL OF BILATERAL TISSUE EXPANDERS WITH PLACEMENT OF BILATERAL BREAST IMPLANTS Bilateral 04/06/2019   Procedure: REMOVAL OF BILATERAL TISSUE EXPANDERS WITH PLACEMENT OF BILATERAL BREAST IMPLANTS;  Surgeon: Wallace Going, DO;  Location: [facility];  Service: Plastics;  Laterality: Bilateral;  . RT lumpectomy      Social History   Socioeconomic History  . Marital status: Legally Separated    Spouse name: Not on file  . Number of children: 2  . Years of education: Not on file  . Highest education level: Not on file  Occupational History  . Not on file  Tobacco Use  . Smoking status: Never Smoker  . Smokeless tobacco: Never Used  Substance and Sexual Activity  . Alcohol use: Yes    Alcohol/week: 0.0 standard drinks    Comment: occ. - socially  . Drug use: No  . Sexual activity: Yes    Birth control/protection: None, Surgical  Other Topics Concern  . Not on file  Social History Narrative  . Not on file   Social Determinants of Health   Financial Resource Strain:   . Difficulty of Paying Living Expenses: Not on file  Food Insecurity:   . Worried About Charity fundraiser in the Last Year: Not on file  . Ran Out of Food in the Last Year: Not on file  Transportation Needs:   . Lack of Transportation (Medical): Not on file  . Lack of Transportation (Non-Medical): Not on file  Physical Activity:   . Days of Exercise per Week: Not on file  . Minutes of  Exercise per Session: Not on file  Stress:   . Feeling of Stress : Not on file  Social Connections:   . Frequency of Communication with Friends and Family: Not on file  . Frequency of Social Gatherings with Friends and Family: Not on file  . Attends Religious Services: Not on file  . Active Member of Clubs or Organizations: Not on file  . Attends Archivist Meetings: Not on file  . Marital Status: Not on file  Intimate Partner Violence:   . Fear of Current or Ex-Partner: Not on file  . Emotionally Abused: Not on file  . Physically Abused: Not on file  . Sexually Abused: Not on file    Outpatient Encounter Medications as of 04/08/2019  Medication Sig  . acetaminophen (TYLENOL) 500 MG tablet Take 1,000 mg by mouth every 6 (six) hours as needed for moderate pain or headache.  . albuterol (PROVENTIL HFA;VENTOLIN HFA) 108 (90 Base) MCG/ACT inhaler Inhale 2 puffs into the lungs every 6 (  six) hours as needed for wheezing or shortness of breath.  . APPLE CIDER VINEGAR PO Take 1 capsule by mouth 3 (three) times daily.  . cetirizine (ZYRTEC) 10 MG tablet Take 1-2 tablets 1-2 times a day as needed (Patient taking differently: Take 10 mg by mouth every evening. )  . cyclobenzaprine (FLEXERIL) 10 MG tablet Take 1 tablet (10 mg total) by mouth 2 (two) times daily.  . diazepam (VALIUM) 2 MG tablet TAKE 1 TABLET BY MOUTH EVERY 12 HOURS AS NEEDED FOR ANXIETY (Patient taking differently: Take 2 mg by mouth every 12 (twelve) hours as needed for anxiety. )  . diazepam (VALIUM) 2 MG tablet Take 1 tablet (2 mg total) by mouth every 12 (twelve) hours as needed for up to 10 days for muscle spasms.  . fluconazole (DIFLUCAN) 150 MG tablet Take 1 now and repeat 1 in 3 days then 1 every month before period (Patient taking differently: Take 150 mg by mouth every 30 (thirty) days. every month before period)  . fluticasone (FLONASE) 50 MCG/ACT nasal spray Place 2 sprays into both nostrils daily.  Marland Kitchen  HYDROcodone-acetaminophen (NORCO) 5-325 MG tablet Take 1 tablet by mouth every 6 (six) hours as needed for moderate pain.  . hydrOXYzine (ATARAX/VISTARIL) 10 MG tablet Take 1 tablet (10 mg total) by mouth 3 (three) times daily as needed. Take 1 10 mg tablet tid prn anxiety, can take 2 at HS if needed (Patient not taking: Reported on 04/03/2019)  . hydrOXYzine (ATARAX/VISTARIL) 25 MG tablet Take 25 mg by mouth 3 (three) times daily as needed for anxiety or itching.  . metFORMIN (GLUCOPHAGE) 500 MG tablet Take 500 mg by mouth 2 (two) times daily.   . Multiple Vitamin (MULTIVITAMIN WITH MINERALS) TABS tablet Take 1 tablet by mouth daily.  Marland Kitchen omeprazole (PRILOSEC) 40 MG capsule Take 1 capsule (40 mg total) by mouth at bedtime. TAKE 1 CAPSULE BY MOUTH ONCE DAILY** NEEDS  TO BE SEEN  BEFORE  NEXT  REFILL**  . ondansetron (ZOFRAN) 4 MG tablet Take 1 tablet (4 mg total) by mouth every 8 (eight) hours as needed for nausea or vomiting.  Marland Kitchen Spacer/Aero Chamber Mouthpiece MISC 1 each by Does not apply route every 6 (six) hours as needed.  . tamoxifen (NOLVADEX) 20 MG tablet Take 1 tablet (20 mg total) by mouth daily. (Patient taking differently: Take 20 mg by mouth at bedtime. )  . [DISCONTINUED] fluticasone furoate-vilanterol (BREO ELLIPTA) 100-25 MCG/INH AEPB Inhale 1 puff into the lungs daily. (Patient not taking: Reported on 04/03/2019)  . [DISCONTINUED] montelukast (SINGULAIR) 10 MG tablet TAKE 1 TABLET BY MOUTH AT BEDTIME (Patient not taking: Reported on 04/03/2019)  . [DISCONTINUED] omeprazole (PRILOSEC) 40 MG capsule TAKE 1 CAPSULE BY MOUTH ONCE DAILY** NEEDS  TO BE SEEN  BEFORE  NEXT  REFILL** (Patient taking differently: Take 40 mg by mouth every evening. )   No facility-administered encounter medications on file as of 04/08/2019.    Allergies  Allergen Reactions  . Prednisone     Lost taste in her mouth, also makes "me crazy & mean"   . Tolmetin Hives and Swelling  . Nsaids Hives and Swelling  .  Shellfish Allergy Hives and Swelling    Review of Systems  Constitutional: Negative for activity change, appetite change, chills, fatigue, fever and weight loss.  HENT: Negative.  Negative for hoarse voice, sore throat, trouble swallowing and voice change.   Eyes: Negative.   Respiratory: Negative for cough, choking, chest tightness, shortness  of breath and wheezing.   Cardiovascular: Negative for chest pain, palpitations and leg swelling.  Gastrointestinal: Positive for heartburn. Negative for abdominal pain, blood in stool, constipation, diarrhea, dysphagia, melena, nausea and vomiting.  Endocrine: Negative.   Genitourinary: Negative for dysuria, frequency and urgency.  Musculoskeletal: Negative for arthralgias, myalgias and muscle weakness.  Skin: Negative.   Allergic/Immunologic: Negative.   Neurological: Negative for dizziness and headaches.  Hematological: Negative.   Psychiatric/Behavioral: Negative for confusion, hallucinations, sleep disturbance and suicidal ideas.  All other systems reviewed and are negative.        Observations/Objective: No vital signs or physical exam, this was a telephone or virtual health encounter.  Pt alert and oriented, answers all questions appropriately, and able to speak in full sentences.    Assessment and Plan: Aleane was seen today for gastroesophageal reflux.  Diagnoses and all orders for this visit:  Gastroesophageal reflux disease without esophagitis No red flags present. Diet discussed. Avoid fried, spicy, fatty, greasy, and acidic foods. Avoid caffeine, nicotine, and alcohol. Do not eat 2-3 hours before bedtime and stay upright for at least 1-2 hours after eating. Eat small frequent meals. Avoid NSAID's like motrin and aleve. Medications as prescribed. Report any new or worsening symptoms. Follow up as discussed or sooner if needed.   -     omeprazole (PRILOSEC) 40 MG capsule; Take 1 capsule (40 mg total) by mouth at bedtime. TAKE 1  CAPSULE BY MOUTH ONCE DAILY** NEEDS  TO BE SEEN  BEFORE  NEXT  REFILL**  Malignant neoplasm of upper-outer quadrant of right breast in female, estrogen receptor positive ([place]) Under the care of oncology, plastics, and OB/GYN. Does follow on a regular basis.     Follow Up Instructions: Return in about 3 months (around 07/07/2019), or if symptoms worsen or fail to improve.    I discussed the assessment and treatment plan with the patient. The patient was provided an opportunity to ask questions and all were answered. The patient agreed with the plan and demonstrated an understanding of the instructions.   The patient was advised to call back or seek an in-person evaluation if the symptoms worsen or if the condition fails to improve as anticipated.  The above assessment and management plan was discussed with the patient. The patient verbalized understanding of and has agreed to the management plan. Patient is aware to call the clinic if they develop any new symptoms or if symptoms persist or worsen. Patient is aware when to return to the clinic for a follow-up visit. Patient educated on when it is appropriate to go to the emergency department.    I provided 15 minutes of non-face-to-face time during this encounter. The call started at 0835. The call ended at 12. The other time was used for coordination of care.    Monia Pouch, FNP-C [facility] [address] [place],  [address] [phone] 04/08/2019

## 2019-04-08 NOTE — Telephone Encounter (Signed)
[place] 1/8 f/u moved to earlier time and converted to [place] visit. Confirmed with patient.

## 2019-04-13 ENCOUNTER — Telehealth: Payer: Self-pay | Admitting: Adult Health

## 2019-04-13 ENCOUNTER — Other Ambulatory Visit: Payer: Self-pay

## 2019-04-13 ENCOUNTER — Encounter: Payer: Self-pay | Admitting: Nurse Practitioner

## 2019-04-13 ENCOUNTER — Ambulatory Visit (INDEPENDENT_AMBULATORY_CARE_PROVIDER_SITE_OTHER): Payer: BC Managed Care – PPO | Admitting: Nurse Practitioner

## 2019-04-13 VITALS — BP 119/87 | HR 61 | Temp 98.4°F | Ht 60.0 in | Wt 260.4 lb

## 2019-04-13 DIAGNOSIS — Z9889 Other specified postprocedural states: Secondary | ICD-10-CM

## 2019-04-13 DIAGNOSIS — C50411 Malignant neoplasm of upper-outer quadrant of right female breast: Secondary | ICD-10-CM

## 2019-04-13 DIAGNOSIS — Z170 Estrogen receptor positive status [ER+]: Secondary | ICD-10-CM

## 2019-04-13 NOTE — Telephone Encounter (Signed)
I talk with patient regarding reschedule °

## 2019-04-13 NOTE — Progress Notes (Signed)
Patient ID: Adriana Moon, female    DOB: Aug 05, 1980, 38 y.o.   MRN: [id number]   Chief Complaint  Patient presents with  . Follow-up    PO follow up from removal breast expanders and placement of silicone implants    Adriana Moon is a 37 yo female with hx of recurrent right breast cancer, right breast radiation, and bilateral mastectomies. She presents for follow up after removal of bilateral breast tissue expanders and placement of silicone implants with Dr. Marla Roe on 04/06/19. Patient denies any pain. She has not taken any pain medications since last Tuesday 12/15. Patient states "I don't like to take pain medications." Patient states she has been nauseous since yesterday. Denies any vomiting. Patient states she has not taken the zofran, because she thought it was only for vomiting. She was also afraid of becoming too sleepy after taking zofran. Patient states she is drinking plenty of fluids. She became constipated for a few days, but is more regular now after taking a stool softener with a laxative. Denies fever, congestion, shortness of breath. Patient thinks there is fluid below her left breast. Patient is concerned about an indention of skin on the right breast. She does not like the look of the right breast.   Review of Systems  Constitutional: Positive for activity change.  HENT: Negative.   Respiratory: Negative.   Cardiovascular: Negative.   Gastrointestinal: Positive for nausea. Negative for abdominal pain, diarrhea and vomiting.       Feels fluid in skin  Genitourinary: Negative.   Musculoskeletal: Negative.   Skin:       Color change from radiation on right breast  Neurological: Negative.     Past Medical History:  Diagnosis Date  . Angio-edema   . Anxiety    manages naturally  . Arthritis of both knees   . Asthma   . Breast cancer ([place])   . Breast disorder    cancer  . Depression    pt. reports that she tends to get down & has had  Lexapro in the past but its been taken off her list for now.   . Family history of breast cancer   . Family history of kidney cancer   . Family history of lung cancer   . Family history of prostate cancer   . GERD (gastroesophageal reflux disease)   . Migraines   . Urticaria     Past Surgical History:  Procedure Laterality Date  . BREAST LUMPECTOMY Right 2009  . BREAST RECONSTRUCTION WITH PLACEMENT OF TISSUE EXPANDER AND FLEX HD (ACELLULAR HYDRATED DERMIS) Bilateral 12/22/2018   Procedure: BILATERAL BREAST RECONSTRUCTION WITH PLACEMENT OF TISSUE EXPANDER AND FLEX HD (ACELLULAR HYDRATED DERMIS);  Surgeon: Wallace Going, DO;  Location: [place];  Service: Plastics;  Laterality: Bilateral;  . BREAST REDUCTION SURGERY Bilateral 01/25/2015   Procedure: MAMMARY REDUCTION  (BREAST);  Surgeon: [name], MD;  Location: [place];  Service: Plastics;  Laterality: Bilateral;  . CESAREAN SECTION     x2  2002 & 2006  . ECTOPIC PREGNANCY SURGERY  06/2018   R side , done at [place]    . MASTECTOMY W/ SENTINEL NODE BIOPSY Bilateral 12/22/2018   with tissue expanders  . MASTECTOMY W/ SENTINEL NODE BIOPSY Bilateral 12/22/2018   Procedure: BILATERAL MASTECTOMIES WITH RIGHT SENTINEL LYMPH NODE BIOPSY;  Surgeon: Coralie Keens, MD;  Location: [place];  Service: General;  Laterality: Bilateral;  .  REDUCTION MAMMAPLASTY Left   . REMOVAL OF BILATERAL TISSUE EXPANDERS WITH PLACEMENT OF BILATERAL BREAST IMPLANTS Bilateral 04/06/2019   Procedure: REMOVAL OF BILATERAL TISSUE EXPANDERS WITH PLACEMENT OF BILATERAL BREAST IMPLANTS;  Surgeon: Wallace Going, DO;  Location: [place];  Service: Plastics;  Laterality: Bilateral;  . RT lumpectomy        Current Outpatient Medications:  .  acetaminophen (TYLENOL) 500 MG tablet, Take 1,000 mg by mouth every 6 (six) hours as needed for moderate pain or headache., Disp: , Rfl:  .  albuterol (PROVENTIL  HFA;VENTOLIN HFA) 108 (90 Base) MCG/ACT inhaler, Inhale 2 puffs into the lungs every 6 (six) hours as needed for wheezing or shortness of breath., Disp: 1 Inhaler, Rfl: 2 .  APPLE CIDER VINEGAR PO, Take 1 capsule by mouth 3 (three) times daily., Disp: , Rfl:  .  cetirizine (ZYRTEC) 10 MG tablet, Take 1-2 tablets 1-2 times a day as needed (Patient taking differently: Take 10 mg by mouth every evening. ), Disp: 180 tablet, Rfl: 3 .  cyclobenzaprine (FLEXERIL) 10 MG tablet, Take 1 tablet (10 mg total) by mouth 2 (two) times daily., Disp: 30 tablet, Rfl: 0 .  diazepam (VALIUM) 2 MG tablet, TAKE 1 TABLET BY MOUTH EVERY 12 HOURS AS NEEDED FOR ANXIETY (Patient taking differently: Take 2 mg by mouth every 12 (twelve) hours as needed for anxiety. ), Disp: 20 tablet, Rfl: 0 .  fluconazole (DIFLUCAN) 150 MG tablet, Take 1 now and repeat 1 in 3 days then 1 every month before period (Patient taking differently: Take 150 mg by mouth every 30 (thirty) days. every month before period), Disp: 6 tablet, Rfl: 2 .  fluticasone (FLONASE) 50 MCG/ACT nasal spray, Place 2 sprays into both nostrils daily., Disp: 16 g, Rfl: 6 .  HYDROcodone-acetaminophen (NORCO) 5-325 MG tablet, Take 1 tablet by mouth every 6 (six) hours as needed for moderate pain., Disp: 30 tablet, Rfl: 0 .  hydrOXYzine (ATARAX/VISTARIL) 25 MG tablet, Take 25 mg by mouth 3 (three) times daily as needed for anxiety or itching., Disp: , Rfl:  .  metFORMIN (GLUCOPHAGE) 500 MG tablet, Take 500 mg by mouth 2 (two) times daily. , Disp: , Rfl:  .  Multiple Vitamin (MULTIVITAMIN WITH MINERALS) TABS tablet, Take 1 tablet by mouth daily., Disp: , Rfl:  .  omeprazole (PRILOSEC) 40 MG capsule, Take 1 capsule (40 mg total) by mouth at bedtime. TAKE 1 CAPSULE BY MOUTH ONCE DAILY** NEEDS  TO BE SEEN  BEFORE  NEXT  REFILL**, Disp: 90 capsule, Rfl: 3 .  ondansetron (ZOFRAN) 4 MG tablet, Take 1 tablet (4 mg total) by mouth every 8 (eight) hours as needed for nausea or vomiting.,  Disp: 20 tablet, Rfl: 0 .  Spacer/Aero Chamber Mouthpiece MISC, 1 each by Does not apply route every 6 (six) hours as needed., Disp: 1 each, Rfl: 0 .  tamoxifen (NOLVADEX) 20 MG tablet, Take 1 tablet (20 mg total) by mouth daily. (Patient taking differently: Take 20 mg by mouth at bedtime. ), Disp: 90 tablet, Rfl: 3   Objective:   Vitals:   04/13/19 0915  BP: 119/87  Pulse: 61  Temp: 98.4 F (36.9 C)  SpO2: 100%    Physical Exam  General: alert, calm, no acute distress HEENT:nomocephalic Neck: full ROM Chest: symmetrical rise and fall Lungs: unlabored breathing Breast: bilateral high-riding implants; bilateral lateral transverse incisions clean, dry, intact, and approximated, dermabond present; healed vertical incisions, indention of breast tissue on medial aspect of right  breast; hyperpigmentation on right IMF Abdomen: obese, soft, non-distended, non-tender, slight fluid wave in epigastric area  Musculoskeletal: MAEx4 Neuro: A&O x3, calm, cooperative, steady gait Skin: warm, dry     Assessment & Plan:  Status post bilateral breast reconstruction  Malignant neoplasm of upper-outer quadrant of right breast in female, estrogen receptor positive (HCC)  Adriana Moon is a 38 yo female POD #7 s/p removal of bilateral breast tissue expanders and placement of breast implants. Her incisions are healing well.  No signs of infection. There is an indentation on the medial aspect of the right breast, despite all attempts to loosen the capsule. This is largely due to effects from radiation. Can consider liposuction and fat grafting around February 2021. May begin wearing sports bra 24/7. Patient was encouraged to take zofran for nausea. Patient informed zofran is not a sedating medication, but can cause constipation. Patient was encouraged to take miralax as needed for constipation. Patient encouraged to drink plenty of fluids. Follow up in 3 weeks or sooner as needed.    Picture  placed in patient's chart with patient's permission.    Alfredo Batty, NP

## 2019-04-19 DIAGNOSIS — R4020 Unspecified coma: Secondary | ICD-10-CM | POA: Diagnosis not present

## 2019-04-19 DIAGNOSIS — R42 Dizziness and giddiness: Secondary | ICD-10-CM | POA: Diagnosis not present

## 2019-04-22 DIAGNOSIS — M71572 Other bursitis, not elsewhere classified, left ankle and foot: Secondary | ICD-10-CM | POA: Diagnosis not present

## 2019-04-22 DIAGNOSIS — M71571 Other bursitis, not elsewhere classified, right ankle and foot: Secondary | ICD-10-CM | POA: Diagnosis not present

## 2019-04-22 DIAGNOSIS — M722 Plantar fascial fibromatosis: Secondary | ICD-10-CM | POA: Diagnosis not present

## 2019-04-28 ENCOUNTER — Ambulatory Visit (INDEPENDENT_AMBULATORY_CARE_PROVIDER_SITE_OTHER): Payer: BC Managed Care – PPO | Admitting: Family Medicine

## 2019-04-28 ENCOUNTER — Encounter (INDEPENDENT_AMBULATORY_CARE_PROVIDER_SITE_OTHER): Payer: Self-pay | Admitting: Family Medicine

## 2019-04-28 ENCOUNTER — Other Ambulatory Visit: Payer: Self-pay

## 2019-04-28 VITALS — BP 108/73 | HR 71 | Temp 98.0°F | Ht 60.0 in | Wt 260.0 lb

## 2019-04-28 DIAGNOSIS — Z0289 Encounter for other administrative examinations: Secondary | ICD-10-CM

## 2019-04-28 DIAGNOSIS — Z9189 Other specified personal risk factors, not elsewhere classified: Secondary | ICD-10-CM

## 2019-04-28 DIAGNOSIS — Z6841 Body Mass Index (BMI) 40.0 and over, adult: Secondary | ICD-10-CM

## 2019-04-28 DIAGNOSIS — J45909 Unspecified asthma, uncomplicated: Secondary | ICD-10-CM

## 2019-04-28 DIAGNOSIS — R5383 Other fatigue: Secondary | ICD-10-CM

## 2019-04-28 DIAGNOSIS — C50919 Malignant neoplasm of unspecified site of unspecified female breast: Secondary | ICD-10-CM

## 2019-04-28 DIAGNOSIS — R1013 Epigastric pain: Secondary | ICD-10-CM | POA: Diagnosis not present

## 2019-04-28 DIAGNOSIS — Z1331 Encounter for screening for depression: Secondary | ICD-10-CM | POA: Diagnosis not present

## 2019-04-28 DIAGNOSIS — R0602 Shortness of breath: Secondary | ICD-10-CM

## 2019-04-28 DIAGNOSIS — Z9013 Acquired absence of bilateral breasts and nipples: Secondary | ICD-10-CM

## 2019-04-29 LAB — COMPREHENSIVE METABOLIC PANEL
ALT: 7 IU/L (ref 0–32)
AST: 7 IU/L (ref 0–40)
Albumin/Globulin Ratio: 1.3 (ref 1.2–2.2)
Albumin: 4.1 g/dL (ref 3.8–4.8)
Alkaline Phosphatase: 38 IU/L — ABNORMAL LOW (ref 39–117)
BUN/Creatinine Ratio: 15 (ref 9–23)
BUN: 16 mg/dL (ref 6–20)
Bilirubin Total: 0.5 mg/dL (ref 0.0–1.2)
CO2: 26 mmol/L (ref 20–29)
Calcium: 9.2 mg/dL (ref 8.7–10.2)
Chloride: 105 mmol/L (ref 96–106)
Creatinine, Ser: 1.06 mg/dL — ABNORMAL HIGH (ref 0.57–1.00)
GFR calc Af Amer: 78 mL/min/{1.73_m2} (ref >59)
GFR calc non Af Amer: 67 mL/min/{1.73_m2} (ref >59)
Globulin, Total: 3.2 g/dL (ref 1.5–4.5)
Glucose: 81 mg/dL (ref 65–99)
Potassium: 4.2 mmol/L (ref 3.5–5.2)
Sodium: 142 mmol/L (ref 134–144)
Total Protein: 7.3 g/dL (ref 6.0–8.5)

## 2019-04-29 LAB — CBC WITH DIFFERENTIAL/PLATELET
Basophils Absolute: 0 10*3/uL (ref 0.0–0.2)
Basos: 0 %
EOS (ABSOLUTE): 0.1 10*3/uL (ref 0.0–0.4)
Eos: 1 %
Hemoglobin: 12.5 g/dL (ref 11.1–15.9)
Immature Grans (Abs): 0 10*3/uL (ref 0.0–0.1)
Immature Granulocytes: 0 %
Lymphocytes Absolute: 2.9 10*3/uL (ref 0.7–3.1)
Lymphs: 39 %
MCH: 28.3 pg (ref 26.6–33.0)
MCHC: 33.1 g/dL (ref 31.5–35.7)
MCV: 86 fL (ref 79–97)
Monocytes Absolute: 0.6 10*3/uL (ref 0.1–0.9)
Monocytes: 8 %
Neutrophils Absolute: 3.9 10*3/uL (ref 1.4–7.0)
Neutrophils: 52 %
Platelets: 338 10*3/uL (ref 150–450)
RBC: 4.42 x10E6/uL (ref 3.77–5.28)
RDW: 13.2 % (ref 11.7–15.4)
WBC: 7.5 10*3/uL (ref 3.4–10.8)

## 2019-04-29 LAB — ANEMIA PANEL
Ferritin: 153 ng/mL — ABNORMAL HIGH (ref 15–150)
Folate, Hemolysate: 404 ng/mL
Folate, RBC: 1069 ng/mL (ref >498)
Hematocrit: 37.8 % (ref 34.0–46.6)
Iron Saturation: 26 % (ref 15–55)
Iron: 82 ug/dL (ref 27–159)
Retic Ct Pct: 1.3 % (ref 0.6–2.6)
Total Iron Binding Capacity: 315 ug/dL (ref 250–450)
UIBC: 233 ug/dL (ref 131–425)
Vitamin B-12: 880 pg/mL (ref 232–1245)

## 2019-04-29 LAB — VITAMIN D 25 HYDROXY (VIT D DEFICIENCY, FRACTURES): Vit D, 25-Hydroxy: 22.2 ng/mL — ABNORMAL LOW (ref 30.0–100.0)

## 2019-04-29 LAB — LIPID PANEL WITH LDL/HDL RATIO
Cholesterol, Total: 170 mg/dL (ref 100–199)
HDL: 80 mg/dL (ref >39)
LDL Chol Calc (NIH): 80 mg/dL (ref 0–99)
LDL/HDL Ratio: 1 ratio (ref 0.0–3.2)
Triglycerides: 47 mg/dL (ref 0–149)
VLDL Cholesterol Cal: 10 mg/dL (ref 5–40)

## 2019-04-29 LAB — TSH: TSH: 2.63 u[IU]/mL (ref 0.450–4.500)

## 2019-04-29 LAB — T4, FREE: Free T4: 1.59 ng/dL (ref 0.82–1.77)

## 2019-04-29 LAB — HEMOGLOBIN A1C
Est. average glucose Bld gHb Est-mCnc: 108 mg/dL
Hgb A1c MFr Bld: 5.4 % (ref 4.8–5.6)

## 2019-04-29 LAB — T3: T3, Total: 123 ng/dL (ref 71–180)

## 2019-04-29 LAB — INSULIN, RANDOM: INSULIN: 19.4 u[IU]/mL (ref 2.6–24.9)

## 2019-04-29 NOTE — Progress Notes (Signed)
Dear Adriana Moon,   Thank you for referring Adriana Moon to our clinic. The following note includes my evaluation and treatment recommendations.  Subjective:   Chief Complaint: OBESITY Adriana Moon (MR# [id number]) is a 39 y.o. female who presents for evaluation and treatment of obesity and related comorbidities. Current BMI is Body mass index is 50.78 kg/m.Adriana Moon has been struggling with her weight for many years and has been unsuccessful in either losing weight, maintaining weight loss, or reaching her healthy weight goal.  Adriana Moon states she is currently in the action stage of change and ready to dedicate time achieving and maintaining a healthier weight. Adriana Moon is interested in becoming our patient and working on intensive lifestyle modifications including (but not limited to) diet and exercise for weight loss.  Adriana Moon's habits were reviewed today and are as follows: Her family eats meals together, she thinks her family will eat healthier with her, her desired weight loss is 100 pounds, she has been heavy most of her life, she started gaining weight around the 4th grade, her heaviest weight ever was 298 pounds, she craves sweets like cakes, pies, and ice cream, she snacks frequently in the evenings, she skips meals frequently, she is frequently drinking liquids with calories, she frequently makes poor food choices, she has problems with excessive hunger, she frequently eats larger portions than normal, she has binge eating behaviors and she struggles with emotional eating.  She states she would become vegetarian or vegan if it will help her and she can afford it.  1. Other fatigue Adriana Moon feels her energy is lower than it should be. This has worsened with weight gain and has worsened recently. Adriana Moon denies daytime somnolence and admits to waking up still tired. Patient is at risk for obstructive sleep apnea. Patient has a history of symptoms  of daytime fatigue. Patient generally gets 6 or 7 hours of sleep per night, and states they generally have restful sleep. Snoring is present. Apneic episodes are not present. Epworth Sleepiness Score is 6.  2. SOB (shortness of breath) on exertion Adriana Moon notes increasing shortness of breath with exercising and seems to be worsening over time with weight gain. She notes getting out of breath sooner with activity than she used to. This has gotten worse recently. Adriana Moon denies shortness of breath at rest or orthopnea.  3. Malignant neoplasm of female breast, unspecified estrogen receptor status, unspecified laterality, unspecified site of breast ([place]) Bijal is currently taking tamoxifen.    4. Dyspepsia Gastrointestinal ROS: no abdominal pain, change in bowel habits, or black or bloody stools. Medication compliance: taking as prescribed.  5. Asthma, unspecified asthma severity, unspecified whether complicated, unspecified whether persistent Patient is using an albuterol inhaler as needed.  6. Hx of bilateral mastectomy Adriana Moon has a history of breast cancer and is s/p double mastectomy. She would like future reconstructive surgery and weight loss is needed for this.   7. At risk for osteoporosis Adriana Moon is at higher risk of osteopenia and osteoporosis due to Vitamin D deficiency.   8.  Depression Screen Adriana Moon's Food and Mood (modified PHQ-9) score was 12.  Depression screen PHQ 2/9 04/28/2019  Decreased Interest 2  Down, Depressed, Hopeless 2  PHQ - 2 Score 4  Altered sleeping 1  Tired, decreased energy 2  Change in appetite 2  Feeling bad or failure about yourself  2  Trouble concentrating 1  Moving slowly or fidgety/restless 0  Suicidal thoughts 0  PHQ-9 Score 12  Difficult doing work/chores Not difficult at all  Some recent data might be hidden   Assessment/Plan:   1. Other fatigue Adriana Moon was informed fatigue may be related to obesity,  depression or many other causes. Labs will be ordered, and in the meanwhile Adriana Moon has agreed to work on diet, exercise and weight loss.  Orders - EKG 12-Lead - Comprehensive Metabolic Panel (CMET) - CBC w/Diff/Platelet - HgB A1c - Insulin, random - Lipid Panel With LDL/HDL Ratio - Vitamin D (25 hydroxy) - Anemia panel - T3 - T4, free - TSH  2. SOB (shortness of breath) on exertion Adriana Moon's shortness of breath appears to be obesity related and exercise induced. She has agreed to work on weight loss and gradually increase exercise to treat her exercise induced shortness of breath. Will continue to monitor closely.  Orders - Comprehensive Metabolic Panel (CMET) - CBC w/Diff/Platelet - HgB A1c - Insulin, random - Lipid Panel With LDL/HDL Ratio - Vitamin D (25 hydroxy) - Anemia panel - T3 - T4, free - TSH  3. Malignant neoplasm of female breast, unspecified estrogen receptor status, unspecified laterality, unspecified site of breast [name]) Patient with history of breast cancer.  She is currently taking tamoxifen 20 mg daily.  This puts her at risk for osteoporosis.   Orders - Comprehensive Metabolic Panel (CMET)  4. Dyspepsia The patient is taking omeprazole 40 mg daily. The current medical regimen is effective;  continue present plan and medications.  5. Asthma, unspecified asthma severity, unspecified whether complicated, unspecified whether persistent Adriana Moon suffers from asthma and uses an albuterol inhaler when needed. The current medical regimen is effective;  continue present plan and medications.  6. Hx of bilateral mastectomy Adriana Moon has history of a double mastectomy.  She wants to get reconstructive surgery, but in order to do that, her BMI must be less than 35. Will work on weight loss and exercise for weight loss.  7. At risk for osteoporosis Adriana Moon was given (~15 minutes) osteoporosis prevention counseling today. Adriana Moon is at risk for osteopenia and  osteoporosis due to her Vitamin D deficiency. She was encouraged to take her Vitamin D and follow her higher calcium diet and increase strengthening exercise to help strengthen her bones and decrease her risk of osteopenia and osteoporosis.  8. Depression screening Behavior modification techniques were discussed today to help Manica deal with her emotional/non-hunger eating behaviors.  Orders and follow up as documented in patient record.   Obesity Jalayna is currently in the action stage of change and her goal is to continue with weight loss efforts. I recommend Nikira begin the structured treatment plan as follows:  She has agreed to Category 1 Plan.  We discussed the following exercise goals today: For substantial health benefits, adults should do at least 150 minutes (2 hours and 30 minutes) a week of moderate-intensity, or 75 minutes (1 hour and 15 minutes) a week of vigorous-intensity aerobic physical activity, or an equivalent combination of moderate- and vigorous-intensity aerobic activity. Aerobic activity should be performed in episodes of at least 10 minutes, and preferably, it should be spread throughout the week. Adults should also include muscle-strengthening activities that involve all major muscle groups on 2 or more days a week.   We discussed the following behavioral modification strategies today: increasing lean protein intake, decreasing simple carbohydrates, increasing vegetables, increasing water intake and planning for success.  She was informed of the importance of frequent follow-up visits to maximize her success with intensive lifestyle modifications for her  multiple health conditions. She was informed we would discuss her lab results at her next visit unless there is a critical issue that needs to be addressed sooner. Alandria agreed to keep her next visit at the agreed upon time to discuss these results.  Objective:   Blood pressure 108/73, pulse 71, temperature 98  F (36.7 C), temperature source Oral, height 5' (1.524 m), weight 260 lb (117.9 kg), last menstrual period 02/26/2019, SpO2 100 %. Body mass index is 50.78 kg/m.  EKG: normal sinus rhythm, rate 73.   Indirect Calorimeter completed today shows a VO2 of 207 and a REE of 1443.  Her calculated basal metabolic rate is [id number] thus her basal metabolic rate is worse than expected.  General: Cooperative, alert, well developed, in no acute distress. HEENT: Conjunctivae and lids unremarkable. Neck: No thyromegaly.  Cardiovascular: Regular rhythm.  Lungs: Normal work of breathing. Extremities: No edema.  Neurologic: No focal deficits.   Lab Results  Component Value Date   CREATININE 1.06 (H) 04/28/2019   BUN 16 04/28/2019   NA 142 04/28/2019   K 4.2 04/28/2019   CL 105 04/28/2019   CO2 26 04/28/2019   Lab Results  Component Value Date   ALT 7 04/28/2019   AST 7 04/28/2019   ALKPHOS 38 (L) 04/28/2019   BILITOT 0.5 04/28/2019   Lab Results  Component Value Date   HGBA1C 5.4 04/28/2019   HGBA1C 5.5 04/20/2016   Lab Results  Component Value Date   INSULIN WILL FOLLOW 04/28/2019   Lab Results  Component Value Date   TSH 2.630 04/28/2019   Lab Results  Component Value Date   CHOL 170 04/28/2019   HDL 80 04/28/2019   LDLCALC 80 04/28/2019   TRIG 47 04/28/2019   CHOLHDL 2.3 05/08/2017   Lab Results  Component Value Date   WBC 7.5 04/28/2019   HGB 12.5 04/28/2019   HCT 37.8 04/28/2019   MCV 86 04/28/2019   PLT 338 04/28/2019   Lab Results  Component Value Date   IRON 82 04/28/2019   TIBC 315 04/28/2019   FERRITIN 153 (H) 04/28/2019   Attestation Statements:   Reviewed by clinician on day of visit: allergies, medications, problem list, medical history, surgical history, family history, social history and previous encounter notes.  This visit occurred during the SARS-CoV-2 public health emergency.  Safety protocols were in place, including screening questions prior to  the visit, additional usage of staff PPE, and extensive cleaning of exam room while observing appropriate contact time as indicated for disinfecting solutions. (CPT [id number])  I, Water quality scientist, CMA, am acting as transcriptionist for [employer], DO .  I have reviewed the above documentation for accuracy and completeness, and I agree with the above. Briscoe Deutscher, DO

## 2019-04-30 ENCOUNTER — Encounter (INDEPENDENT_AMBULATORY_CARE_PROVIDER_SITE_OTHER): Payer: Self-pay | Admitting: Family Medicine

## 2019-05-01 ENCOUNTER — Telehealth: Payer: BC Managed Care – PPO | Admitting: Adult Health

## 2019-05-04 ENCOUNTER — Telehealth: Payer: Self-pay

## 2019-05-04 NOTE — Telephone Encounter (Signed)

## 2019-05-05 ENCOUNTER — Ambulatory Visit (INDEPENDENT_AMBULATORY_CARE_PROVIDER_SITE_OTHER): Payer: BC Managed Care – PPO | Admitting: Plastic Surgery

## 2019-05-05 ENCOUNTER — Other Ambulatory Visit (INDEPENDENT_AMBULATORY_CARE_PROVIDER_SITE_OTHER): Payer: BC Managed Care – PPO | Admitting: Plastic Surgery

## 2019-05-05 ENCOUNTER — Other Ambulatory Visit: Payer: Self-pay

## 2019-05-05 ENCOUNTER — Encounter: Payer: Self-pay | Admitting: Plastic Surgery

## 2019-05-05 VITALS — BP 119/79 | HR 73 | Temp 99.3°F | Ht 60.0 in | Wt 258.8 lb

## 2019-05-05 DIAGNOSIS — Z9889 Other specified postprocedural states: Secondary | ICD-10-CM | POA: Insufficient documentation

## 2019-05-05 DIAGNOSIS — Z9013 Acquired absence of bilateral breasts and nipples: Secondary | ICD-10-CM

## 2019-05-05 DIAGNOSIS — C50919 Malignant neoplasm of unspecified site of unspecified female breast: Secondary | ICD-10-CM

## 2019-05-05 NOTE — Progress Notes (Signed)
PT Order placed for evaluation and treatment of upper extremity ROM and strength after bilateral mastectomies and reconstruction.

## 2019-05-05 NOTE — Progress Notes (Signed)
   Subjective:     Patient ID: Adriana Moon, female    DOB: Jul 10, 1980, 39 y.o.   MRN: [id number]  Chief Complaint  Patient presents with  . Follow-up    3 week follow up from BL breast removal of breast expanders and placement of silicone implants SX: [id number]    HPI: The patient is a 39 y.o. female here for follow-up after removal of bilateral breast tissue expanders and placement of silicone implants with Dr. Marla Roe on 04/06/2019.  She denies pain, redness, drainage.  Reports some swelling inferior to the left breast.  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  Gastrointestinal: Negative for constipation, diarrhea, nausea and vomiting.  Skin: Negative for color change, pallor and rash.       Reports swelling inferior to left breast.     Objective:   Vital Signs BP 119/79 (BP Location: Left Arm, Patient Position: Sitting, Cuff Size: Large)   Pulse 73   Temp 99.3 F (37.4 C) (Temporal)   Ht 5' (1.524 m)   Wt 258 lb 12.8 oz (117.4 kg)   SpO2 99%   BMI 50.54 kg/m  Vital Signs and Nursing Note Reviewed  Physical Exam  Constitutional: She is oriented to person, place, and time and well-developed, well-nourished, and in no distress.  HENT:  Head: Normocephalic and atraumatic.  Eyes: EOM are normal.  Pulmonary/Chest: Effort normal.  Bilateral incisions c/d/i. Indentation on medial aspect of right breast. Some color changes of right breast from radiation.   Musculoskeletal:     Cervical back: Normal range of motion.  Neurological: She is alert and oriented to person, place, and time. Gait normal.  Skin: Skin is warm and dry. No rash noted. No erythema. No pallor.  Psychiatric: Mood, memory, affect and judgment normal.      Assessment/Plan:      ICD-10-CM   1. Acquired absence of both breasts  Z90.13   2. S/P breast reconstruction, bilateral  Z98.890   3. Malignant neoplasm of female breast, unspecified estrogen receptor status, unspecified laterality, unspecified site of breast ([place])  C50.919     Adriana Moon is doing very well. Incisions are healing nicely, c/d/i. No signs of infection, seroma/hematoma, redness, or drainage. Indentation on medial aspect of right breast largely due to radiation effects. Can consider liposuction and fat grafting around Feb 2021 if desired.   Continue to wear sports bra 24/7 until 6 weeks post-op.  Follow up in 4- 6 weeks. Call office with any questions or concerns.   The [place] was signed into law in 2016 which includes the topic of electronic health records.  This provides immediate access to information in MyChart.  This includes consultation notes, operative notes, office notes, lab results and pathology reports.  If you have any questions about what you read please let us know at your next visit or call us at the office.  We are right here with you.   Threasa Heads, PA-C 05/05/2019, 3:56 PM

## 2019-05-12 ENCOUNTER — Ambulatory Visit (INDEPENDENT_AMBULATORY_CARE_PROVIDER_SITE_OTHER): Payer: BC Managed Care – PPO | Admitting: Family Medicine

## 2019-05-12 ENCOUNTER — Encounter (INDEPENDENT_AMBULATORY_CARE_PROVIDER_SITE_OTHER): Payer: Self-pay | Admitting: Family Medicine

## 2019-05-12 ENCOUNTER — Other Ambulatory Visit: Payer: Self-pay

## 2019-05-12 VITALS — BP 125/90 | HR 83 | Temp 98.8°F | Ht 60.0 in | Wt 248.0 lb

## 2019-05-12 DIAGNOSIS — Z9189 Other specified personal risk factors, not elsewhere classified: Secondary | ICD-10-CM

## 2019-05-12 DIAGNOSIS — E8881 Metabolic syndrome: Secondary | ICD-10-CM | POA: Diagnosis not present

## 2019-05-12 DIAGNOSIS — E559 Vitamin D deficiency, unspecified: Secondary | ICD-10-CM

## 2019-05-12 DIAGNOSIS — C50411 Malignant neoplasm of upper-outer quadrant of right female breast: Secondary | ICD-10-CM

## 2019-05-12 DIAGNOSIS — Z170 Estrogen receptor positive status [ER+]: Secondary | ICD-10-CM

## 2019-05-12 DIAGNOSIS — F3289 Other specified depressive episodes: Secondary | ICD-10-CM

## 2019-05-12 DIAGNOSIS — Z6841 Body Mass Index (BMI) 40.0 and over, adult: Secondary | ICD-10-CM

## 2019-05-13 ENCOUNTER — Telehealth: Payer: Self-pay | Admitting: Surgical

## 2019-05-13 DIAGNOSIS — I8312 Varicose veins of left lower extremity with inflammation: Secondary | ICD-10-CM | POA: Diagnosis not present

## 2019-05-13 DIAGNOSIS — I83892 Varicose veins of left lower extremities with other complications: Secondary | ICD-10-CM | POA: Diagnosis not present

## 2019-05-13 MED ORDER — VITAMIN D (ERGOCALCIFEROL) 1.25 MG (50000 UNIT) PO CAPS: 50000.0000 [IU] | ORAL_CAPSULE | ORAL | 0 refills | Status: DC

## 2019-05-13 NOTE — Telephone Encounter (Signed)
I am happy to write a letter, but I have not seen this patient in a while so I think it would be best for [facility] and Dr. Marla Roe to determine if she should return to work or continue to be out of work.

## 2019-05-13 NOTE — Progress Notes (Signed)
Chief Complaint:   OBESITY Adriana Moon is here to discuss her progress with her obesity treatment plan along with follow-up of her obesity related diagnoses. Kimery is on the Category 1 Plan and states she is following her eating plan approximately 80% of the time. Katye states she is biking for 40 minutes or using the stepper for 60 minutes 3 times per week.  Today's visit was #: 2 Starting weight: 260 lbs Starting date: 04/28/2019 Today's weight: 248 lbs Today's date: 05/12/2019 Total lbs lost to date: 12 lbs Total lbs lost since last in-office visit: 12 lbs  Interim History: Scherri endorses [employer] cookies being a big issue.  Refers to them as "pint-sized pushers".  Patient loves the mint.  Will eat an entire sleeve at one sitting.    Subjective:   1. Malignant neoplasm of upper-outer quadrant of right breast in female, estrogen receptor positive ([place]) Adriana Moon is currently taking tamoxifen.  2. Insulin resistance Adriana Moon has a diagnosis of insulin resistance based on her elevated fasting insulin level >5. She continues to work on diet and exercise to decrease her risk of diabetes.  She is taking metformin 500 mg daily.  Lab Results  Component Value Date   INSULIN 19.4 04/28/2019   Lab Results  Component Value Date   HGBA1C 5.4 04/28/2019   3. Vitamin D deficiency Adriana Moon's Vitamin D level was 22.2 on 04/28/2019. She is not currently taking vit D. She denies nausea, vomiting or muscle weakness.  4. Other depression, with emotional eating Adriana Moon is struggling with emotional eating and using food for comfort to the extent that it is negatively impacting her health. She has been working on behavior modification techniques to help reduce her emotional eating and has been unsuccessful. She shows no sign of suicidal or homicidal ideations.  5. At risk for constipation Adriana Moon is at increased risk for constipation due to inadequate water intake, changes in diet,  and/or use of medications such as GLP1 agonists. Adriana Moon denies hard, infrequent stools currently.   Assessment/Plan:   1. Malignant neoplasm of upper-outer quadrant of right breast in female, estrogen receptor positive ([place]) Will monitor side effects of Tamoxifen and adjust plan as needed.   2. Insulin resistance Adriana Moon has a diagnosis of insulin resistance based on her elevated fasting insulin level >5. She continues to work on diet and exercise to decrease her risk of diabetes.  Will increase metformin to twice daily dosing.  Lab Results  Component Value Date   INSULIN 19.4 04/28/2019   Lab Results  Component Value Date   HGBA1C 5.4 04/28/2019    3. Vitamin D deficiency Low Vitamin D level contributes to fatigue and are associated with obesity, breast, and colon cancer. She agrees to continue to take prescription Vitamin D @50 ,000 IU every week and will follow-up for routine testing of Vitamin D, at least 2-3 times per year to avoid over-replacement.  Orders - Vitamin D, Ergocalciferol, (DRISDOL) 1.25 MG (50000 UNIT) CAPS capsule; Take 1 capsule (50,000 Units total) by mouth every 7 (seven) days.  Dispense: 4 capsule; Refill: 0  4. Other depression, with emotional eating Patient was referred to Dr. Mallie Mussel, our Bariatric Psychologist, for evaluation due to her elevated PHQ-9 score and significant struggles with emotional eating.  5. At risk for constipation [name] was given approximately 15 minutes of counseling today regarding prevention of constipation. She was encouraged to increase water and fiber intake.   6. Class 3 severe obesity with serious comorbidity and body  mass index (BMI) of 45.0 to 49.9 in adult, unspecified obesity type [facility]) Adriana Moon is currently in the action stage of change. As such, her goal is to continue with weight loss efforts. She has agreed to keeping a food journal and adhering to recommended goals of 1000 calories and 85+ grams of protein.    Exercise goals: For substantial health benefits, adults should do at least 150 minutes (2 hours and 30 minutes) a week of moderate-intensity, or 75 minutes (1 hour and 15 minutes) a week of vigorous-intensity aerobic physical activity, or an equivalent combination of moderate- and vigorous-intensity aerobic activity. Aerobic activity should be performed in episodes of at least 10 minutes, and preferably, it should be spread throughout the week. Adults should also include muscle-strengthening activities that involve all major muscle groups on 2 or more days a week.  Behavioral modification strategies: increasing lean protein intake, decreasing simple carbohydrates, increasing vegetables and increasing water intake.  Adriana Moon has agreed to follow-up with our clinic in 2 weeks. She was informed of the importance of frequent follow-up visits to maximize her success with intensive lifestyle modifications for her multiple health conditions.   Objective:   Blood pressure 125/90, pulse 83, temperature 98.8 F (37.1 C), temperature source Oral, height 5' (1.524 m), weight 248 lb (112.5 kg), last menstrual period 02/22/2019, SpO2 96 %. Body mass index is 48.43 kg/m.  General: Cooperative, alert, well developed, in no acute distress. HEENT: Conjunctivae and lids unremarkable. Cardiovascular: Regular rhythm.  Lungs: Normal work of breathing. Neurologic: No focal deficits.   Lab Results  Component Value Date   CREATININE 1.06 (H) 04/28/2019   BUN 16 04/28/2019   NA 142 04/28/2019   K 4.2 04/28/2019   CL 105 04/28/2019   CO2 26 04/28/2019   Lab Results  Component Value Date   ALT 7 04/28/2019   AST 7 04/28/2019   ALKPHOS 38 (L) 04/28/2019   BILITOT 0.5 04/28/2019   Lab Results  Component Value Date   HGBA1C 5.4 04/28/2019   HGBA1C 5.5 04/20/2016   Lab Results  Component Value Date   INSULIN 19.4 04/28/2019   Lab Results  Component Value Date   TSH 2.630 04/28/2019   Lab Results   Component Value Date   CHOL 170 04/28/2019   HDL 80 04/28/2019   LDLCALC 80 04/28/2019   TRIG 47 04/28/2019   CHOLHDL 2.3 05/08/2017   Lab Results  Component Value Date   WBC 7.5 04/28/2019   HGB 12.5 04/28/2019   HCT 37.8 04/28/2019   MCV 86 04/28/2019   PLT 338 04/28/2019   Lab Results  Component Value Date   IRON 82 04/28/2019   TIBC 315 04/28/2019   FERRITIN 153 (H) 04/28/2019   Attestation Statements:   Reviewed by clinician on day of visit: allergies, medications, problem list, medical history, surgical history, family history, social history, and previous encounter notes.  I, Water quality scientist, CMA, am acting as Location manager for [employer], DO.  I have reviewed the above documentation for accuracy and completeness, and I agree with the above. Briscoe Deutscher, DO

## 2019-05-13 NOTE — Telephone Encounter (Signed)
Pt called to request a letter for her to be out another two weeks, "at least until Feb 1st" since she has to do 2 week of PT. This letter will need to be sent to her disability personnel. Please call once letter has been sent.

## 2019-05-14 NOTE — Progress Notes (Signed)
Office: [phone]  /  Fax: [phone]    Date: May 18, 2019  Time Seen: 3:03pm Duration: 38 minutes Provider: Glennie Isle, PsyD Type of Session: Intake for Individual Therapy  Type of Contact: Face-to-face  Informed Consent for In-Person Services During COVID-19: During today's appointment, information about the decision to initiate in-person services in light of the [id number] public health crisis was discussed. [name] and this provider agreed to meet in person for some or all future appointments. If there is a resurgence of the pandemic or other health concerns arise, telepsychological services may be initiated and any related concerns will be discussed and an attempt to address them will be made. Omega verbally acknowledged understanding that if necessary, this provider may determine there is a need to initiate telepsychological services for everyone's well-being. Lovelle expressed understanding she may request to initiate telepsychological services, and that request will be respected as long as it is feasible and clinically appropriate.  The risks for opting for in-person services was discussed. Lotte verbally acknowledged understanding that by coming to the office, she is assuming the risk of exposure to the coronavirus or other public risk, and the risk may increase if Kayah travels by public transportation, cab, or [employer]. To obtain in-person services, Lynita verbally agreed to taking certain precautions (e.g., screening prior to appointment; universal masking; social distancing of 6 feet; proper hand hygiene) set forth by [place] to keep everyone safe from exposure and subsequent consequences. This information was shared by front desk staff either at the time of scheduling and/or during the check-in process. Tatyona expressed understanding that should she not adhere to these safeguards, it may result in starting/returning to a telepsychological service  arrangement and/or the exploration of other options for treatment. Naava acknowledged understanding that [facility] will follow the protocol set forth by [facility] should a patient present with a fever or other symptoms or disclose recent exposure, which will include rescheduling the appointment. Furthermore, Lakeshia acknowledged understanding that precautions may change if additional local, state or federal orders or guidelines are published.This provider also shared that if [facility] tests positive for the coronavirus and was in the [facility] clinic, this provider will follow [place]'s disclosure policy. Similarly, this provider will follow [place]'s disclosure policy should this provider or staff test positive for the coronavirus. To avoid handling of paper/writing instruments and increasing likelihood of touching, verbal consent was obtained by [name] during today's appointment prior to proceeding. Dannilynn provided verbal consent to proceed, and acknowledged understanding that by verbally consenting to proceed, she is agreeable to all information noted above.   Informed Consent: The provider's role was explained to [facility]. The provider reviewed and discussed issues of confidentiality, privacy, and limits therein (e.g., reporting obligations). In addition to verbal informed consent, written informed consent for psychological services was obtained prior to the initial appointment. Since the clinic is not a 24/7 crisis center, mental health emergency resources were shared and this  provider explained MyChart, e-mail, voicemail, and/or other messaging systems should be utilized only for non-emergency reasons. This provider also explained that information obtained during appointments will be placed in [facility] medical record and relevant information will be shared with other providers at [facility] for coordination of care.  Moreover, Merrie agreed information may be shared with other [facility] providers as needed for coordination of care. By signing the service agreement document, Lakeisa provided written consent for coordination of care. [name]  also verbally acknowledged understanding she is ultimately responsible for understanding her insurance benefits for services. Raylynn  acknowledged understanding that appointments cannot be recorded without both party consent. Ariarose verbally consented to proceed.  Chief Complaint/HPI: Jaquisha was referred by Dr. Briscoe Deutscher due to depression with emotional eating behaviors. Per the note for the visit with Dr. Briscoe Deutscher on May 12, 2019, "Layiah is struggling with emotional eating and using food for comfort to the extent that it is negatively impacting her health. She has been working on behavior modification techniques to help reduce her emotional eating and has been unsuccessful. She shows no sign of suicidal or homicidal ideations." During the initial appointment with Dr. Briscoe Deutscher at [facility] on April 28, 2019, [name] reported experiencing the following: snacking frequently in the evenings, frequently drinking liquids with calories, frequently making poor food choices, frequently eating larger portions than normal , binge eating behaviors, struggling with emotional eating, skipping meals frequently, having problems with excessive hunger and cravings for sweets (e.g., cakes, pies, and ice cream). Lynniah's Food and Mood (modified PHQ-9) score on April 28, 2019 was 12.  During today's appointment, Valene was verbally administered a questionnaire assessing various behaviors related to emotional eating. Jatziry endorsed the following: overeat when you are celebrating, experience food cravings on a regular basis, eat certain foods when you are anxious, stressed, depressed, or your feelings are hurt, use food to help you cope  with emotional situations, find food is comforting to you, overeat when you are worried about something, overeat frequently when you are bored or lonely, not worry about what you eat when you are in a good mood and eat as a reward. Edelmira believes the onset of emotional eating was likely around age 77 when she could buy her own food and she described the current frequency of emotional eating as "every day." In addition, Tonda denied a history of binge eating. Raenette reported a history of dieting and cutting out food groups. She recalled purging over the course of one month in 2012, noting that was the last time as she did not want her teeth to rot. She denied laxative use or engagement in any other compensatory strategies for weight loss. Klover denied ever being diagnosed with an eating disorder and she has never been treated for emotional eating. Moreover, Jakhya indicated feeling down triggers emotional eating, whereas taking a nap makes emotional eating better. Furthermore, [name] denied other problems of concern.    Mental Status Examination:  Appearance: well groomed and appropriate hygiene  Behavior: appropriate to circumstances Mood: euthymic Affect: mood congruent Speech: normal in rate, volume, and tone Eye Contact: appropriate Psychomotor Activity: appropriate Gait: normal Thought Process: linear, logical, and goal directed  Thought Content/Perception: denies suicidal and homicidal ideation, plan, and intent and no hallucinations, delusions, bizarre thinking or behavior reported or observed Orientation: time, person, place and purpose of appointment Memory/Concentration: memory, attention, language, and fund of knowledge intact  Insight/Judgment: good  Family & Psychosocial History: Meliyah reported she is in a relationship and she has two children (ages 18 and 36). 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 Additionally, Keighley shared  her highest level of education obtained is some college. Currently, Nance's social support system consists of her sister, mother, boyfriend, and friends. Moreover, Anistyn stated she resides with her youngest son.   Medical History:  Past Medical History:  Diagnosis Date  . Angio-edema   . Anxiety  manages naturally  . Arthritis of both knees   . Asthma   . Bilateral swelling of feet   . Breast cancer ([place])   . Breast disorder    cancer  . Depression    pt. reports that she tends to get down & has had Lexapro in the past but its been taken off her list for now.   . Family history of breast cancer   . Family history of kidney cancer   . Family history of lung cancer   . Family history of prostate cancer   . GERD (gastroesophageal reflux disease)   . History of kidney problems   . IBS (irritable bowel syndrome)   . Joint pain   . Lactose intolerance   . Migraines   . PCOS (polycystic ovarian syndrome)   . Urticaria   . Varicose veins of legs    Past Surgical History:  Procedure Laterality Date  . BREAST LUMPECTOMY Right 2009  . BREAST RECONSTRUCTION WITH PLACEMENT OF TISSUE EXPANDER AND FLEX HD (ACELLULAR HYDRATED DERMIS) Bilateral 12/22/2018   Procedure: BILATERAL BREAST RECONSTRUCTION WITH PLACEMENT OF TISSUE EXPANDER AND FLEX HD (ACELLULAR HYDRATED DERMIS);  Surgeon: Wallace Going, DO;  Location: [place];  Service: Plastics;  Laterality: Bilateral;  . BREAST REDUCTION SURGERY Bilateral 01/25/2015   Procedure: MAMMARY REDUCTION  (BREAST);  Surgeon: [name], MD;  Location: [place];  Service: Plastics;  Laterality: Bilateral;  . CESAREAN SECTION     x2  2002 & 2006  . ECTOPIC PREGNANCY SURGERY  06/2018   R side , done at [place]    . MASTECTOMY W/ SENTINEL NODE BIOPSY Bilateral 12/22/2018   with tissue expanders  . MASTECTOMY W/ SENTINEL NODE BIOPSY Bilateral 12/22/2018   Procedure: BILATERAL  MASTECTOMIES WITH RIGHT SENTINEL LYMPH NODE BIOPSY;  Surgeon: Coralie Keens, MD;  Location: [place];  Service: General;  Laterality: Bilateral;  . REDUCTION MAMMAPLASTY Left   . REMOVAL OF BILATERAL TISSUE EXPANDERS WITH PLACEMENT OF BILATERAL BREAST IMPLANTS Bilateral 04/06/2019   Procedure: REMOVAL OF BILATERAL TISSUE EXPANDERS WITH PLACEMENT OF BILATERAL BREAST IMPLANTS;  Surgeon: Wallace Going, DO;  Location: [place];  Service: Plastics;  Laterality: Bilateral;  . RT lumpectomy     Current Outpatient Medications on File Prior to Visit  Medication Sig Dispense Refill  . acetaminophen (TYLENOL) 500 MG tablet Take 1,000 mg by mouth every 6 (six) hours as needed for moderate pain or headache.    . albuterol (PROVENTIL HFA;VENTOLIN HFA) 108 (90 Base) MCG/ACT inhaler Inhale 2 puffs into the lungs every 6 (six) hours as needed for wheezing or shortness of breath. 1 Inhaler 2  . APPLE CIDER VINEGAR PO Take 1 capsule by mouth 3 (three) times daily.    . cetirizine (ZYRTEC) 10 MG tablet Take 1-2 tablets 1-2 times a day as needed (Patient taking differently: Take 10 mg by mouth every evening. ) 180 tablet 3  . cyclobenzaprine (FLEXERIL) 10 MG tablet Take 1 tablet (10 mg total) by mouth 2 (two) times daily. 30 tablet 0  . diazepam (VALIUM) 2 MG tablet TAKE 1 TABLET BY MOUTH EVERY 12 HOURS AS NEEDED FOR ANXIETY (Patient taking differently: Take 2 mg by mouth every 12 (twelve) hours as needed for anxiety. ) 20 tablet 0  . fluconazole (DIFLUCAN) 150 MG tablet Take 1 now and repeat 1 in 3 days then 1 every month before period (Patient taking differently: Take 150 mg by mouth  every 30 (thirty) days. every month before period) 6 tablet 2  . fluticasone (FLONASE) 50 MCG/ACT nasal spray Place 2 sprays into both nostrils daily. 16 g 6  . hydrOXYzine (ATARAX/VISTARIL) 25 MG tablet Take 25 mg by mouth 3 (three) times daily as needed for anxiety or itching.    . metFORMIN (GLUCOPHAGE) 500 MG tablet Take 500 mg  by mouth 2 (two) times daily.     . Multiple Vitamin (MULTIVITAMIN WITH MINERALS) TABS tablet Take 1 tablet by mouth daily.    Marland Kitchen omeprazole (PRILOSEC) 40 MG capsule Take 1 capsule (40 mg total) by mouth at bedtime. TAKE 1 CAPSULE BY MOUTH ONCE DAILY** NEEDS&nbsp;&nbsp;TO BE SEEN&nbsp;&nbsp;BEFORE&nbsp;&nbsp;NEXT&nbsp;&nbsp;REFILL** 90 capsule 3  . Spacer/Aero Chamber Mouthpiece MISC 1 each by Does not apply route every 6 (six) hours as needed. 1 each 0  . tamoxifen (NOLVADEX) 20 MG tablet Take 1 tablet (20 mg total) by mouth daily. (Patient taking differently: Take 20 mg by mouth at bedtime. ) 90 tablet 3  . Vitamin D, Ergocalciferol, (DRISDOL) 1.25 MG (50000 UNIT) CAPS capsule Take 1 capsule (50,000 Units total) by mouth every 7 (seven) days. 4 capsule 0   No current facility-administered medications on file prior to visit.  Saleah denied a history of head injuries and loss of consciousness.    Mental Health History: Zamiya reported there is no history of therapeutic services. Kailanni reported there is no history of hospitalizations for psychiatric concerns. She recalled meeting with a psychiatrist around 2010, noting she was diagnosed with ADD. She stated she was  prescribed Concerta, which she discontinued. Kendry stated she is currently prescribed hydroxyzine by her PCP for anxiety. Audrielle endorsed a family history of mental health related concerns. She noted a history of suicide and depression on both sides of her family. Lakshmi reported there is no history of trauma including psychological, physical  and sexual abuse, as well as neglect.   Sally described her typical mood lately as "kinda down." Aside from concerns noted above and endorsed on the PHQ-9 and GAD-7, Doreather reported experiencing worry thoughts about her son who is in college, dying, and family's well-being; crying spells; and attention concerns. She also noted she began experiencing panic attacks around the age of 19-20  when experiencing increased worry. She described the frequency as ten times a month. Lilliauna endorsed current alcohol use. She described it as occasional (i.e., once or twice a month), noting she will consume five standard drinks over the course of three hours. She denied concern related to her alcohol use. She denied tobacco and illicit/recreational substance use. Regarding caffeine intake, Cyera reported consuming unsweet tea occasionally and a cup of soda "every now and then." Furthermore, [place] indicated she is not experiencing the following: hopelessness, memory concerns, obsessions and compulsions, hallucinations and delusions, paranoia, symptoms of mania (e.g., expansive mood, flighty ideas, decreased need for sleep, engagement in risky behaviors), angry outbursts, social withdrawal and decreased motivation. She also denied history of and current suicidal ideation, plan, and intent; history of and current homicidal ideation, plan, and intent; and history of and current engagement in self-harm.  The following strengths were reported by [place]: very good social skills, good work, good mother, and overall good person. The following strengths were observed by this provider: ability to express thoughts and feelings during the therapeutic session, ability to establish and benefit from a therapeutic relationship, willingness to work toward established goal(s) with the clinic and ability to engage in reciprocal conversation.  Legal History: Trude reported there  is no history of legal involvement.   Structured Assessments Results: The Patient Health Questionnaire-9 (PHQ-9) is a self-report measure that assesses symptoms and severity of depression over the course of the last two weeks. Deara obtained a score of 9 suggesting mild depression. Annasofia finds the endorsed symptoms to be somewhat difficult. [0= Not at all; 1= Several days; 2= More than half the days; 3= Nearly every day] Little  interest or pleasure in doing things 0  Feeling down, depressed, or hopeless 2  Trouble falling or staying asleep, or sleeping too much 1  Feeling tired or having little energy 0  Poor appetite or overeating 0  Feeling bad about yourself --- or that you are a failure or have let yourself or your family down- weight related 3  Trouble concentrating on things, such as reading the newspaper or watching television 3  Moving or speaking so slowly that other people could have noticed? Or the opposite --- being so fidgety or restless that you have been moving around a lot more than usual 0  Thoughts that you would be better off dead or hurting yourself in some way 0  PHQ-9 Score 9    The Generalized Anxiety Disorder-7 (GAD-7) is a brief self-report measure that assesses symptoms of anxiety over the course of the last two weeks. Catrin obtained a score of 8 suggesting mild anxiety. Deziah finds the endorsed symptoms to be somewhat difficult. [0= Not at all; 1= Several days; 2= Over half the days; 3= Nearly every day] Feeling nervous, anxious, on edge 3  Not being able to stop or control worrying 0  Worrying too much about different things 3  Trouble relaxing 0  Being so restless that it's hard to sit still 0  Becoming easily annoyed or irritable 1  Feeling afraid as if something awful might happen 1  GAD-7 Score 8   Interventions:  Conducted a chart review Focused on rapport building Verbally administered PHQ-9 and GAD-7 for symptom monitoring Verbally administered Food & Mood questionnaire to assess various behaviors related to emotional eating. Provided emphatic reflections and validation Collaborated with patient on a treatment goal  Psychoeducation provided regarding physical versus emotional hunger  Provisional DSM-5 Diagnoses: 311 (F32.8) Other Specified Depressive Disorder, Emotional Eating Behaviors and 300.00 (F41.9) Unspecified Anxiety Disorder  Plan: Mallarie appears able and  willing to participate as evidenced by collaboration on a treatment goal, engagement in reciprocal conversation, and asking questions as needed for clarification. The next appointment will be scheduled in two weeks, which will be via [employer]. The following treatment goal was established: decrease emotional eating. This provider will regularly review the treatment plan and medical chart to keep informed of status changes. Catelaya expressed understanding and agreement with the initial treatment plan of care. Lillienne was provided a handout to utilize between now and the next appointment to increase awareness of hunger patterns and subsequent eating.

## 2019-05-15 ENCOUNTER — Telehealth: Payer: Self-pay | Admitting: Plastic Surgery

## 2019-05-15 ENCOUNTER — Encounter: Payer: Self-pay | Admitting: Plastic Surgery

## 2019-05-15 DIAGNOSIS — I8312 Varicose veins of left lower extremity with inflammation: Secondary | ICD-10-CM | POA: Diagnosis not present

## 2019-05-15 NOTE — Telephone Encounter (Signed)
Spoke with patient. Letter placed in my chart.

## 2019-05-15 NOTE — Telephone Encounter (Signed)
Mrs. Fickes called back to follow up on what is going to be done in regards to PT. Please call her back to advise.

## 2019-05-18 ENCOUNTER — Ambulatory Visit (INDEPENDENT_AMBULATORY_CARE_PROVIDER_SITE_OTHER): Payer: BC Managed Care – PPO | Admitting: Psychology

## 2019-05-18 ENCOUNTER — Other Ambulatory Visit: Payer: Self-pay

## 2019-05-18 DIAGNOSIS — F419 Anxiety disorder, unspecified: Secondary | ICD-10-CM

## 2019-05-18 DIAGNOSIS — F3289 Other specified depressive episodes: Secondary | ICD-10-CM | POA: Diagnosis not present

## 2019-05-18 DIAGNOSIS — Z9889 Other specified postprocedural states: Secondary | ICD-10-CM | POA: Diagnosis not present

## 2019-05-18 DIAGNOSIS — M546 Pain in thoracic spine: Secondary | ICD-10-CM | POA: Diagnosis not present

## 2019-05-18 DIAGNOSIS — Z9013 Acquired absence of bilateral breasts and nipples: Secondary | ICD-10-CM | POA: Diagnosis not present

## 2019-05-18 DIAGNOSIS — C50919 Malignant neoplasm of unspecified site of unspecified female breast: Secondary | ICD-10-CM | POA: Diagnosis not present

## 2019-05-19 DIAGNOSIS — Z9013 Acquired absence of bilateral breasts and nipples: Secondary | ICD-10-CM | POA: Diagnosis not present

## 2019-05-19 DIAGNOSIS — C50919 Malignant neoplasm of unspecified site of unspecified female breast: Secondary | ICD-10-CM | POA: Diagnosis not present

## 2019-05-19 DIAGNOSIS — Z9889 Other specified postprocedural states: Secondary | ICD-10-CM | POA: Diagnosis not present

## 2019-05-19 DIAGNOSIS — M546 Pain in thoracic spine: Secondary | ICD-10-CM | POA: Diagnosis not present

## 2019-05-19 NOTE — Progress Notes (Signed)
Office: [phone]  /  Fax: [phone]    Date: June 02, 2019   Appointment Start Time: 4:01pm Duration: 24 minutes Provider: Glennie Isle, Psy.D. Type of Session: Individual Therapy  Location of Patient: Parked in car at home Location of Provider: Provider's Home Type of Contact: Telepsychological Visit via [employer]  Session Content: Prior to initiating telepsychological services, Adriana Moon completed an informed consent document, which included the development of a safety plan (i.e., an emergency contact, nearest emergency room, and emergency resources) in the event of an emergency/crisis. Adriana Moon expressed understanding of the rationale of the safety plan. Adriana Moon verbally acknowledged understanding she is ultimately responsible for understanding her insurance benefits for telepsychological and in-person services. This provider also reviewed confidentiality, as it relates to telepsychological services, as well as the rationale for telepsychological services (i.e., to reduce exposure risk to COVID-19). Adriana Moon  acknowledged understanding that appointments cannot be recorded without both party consent and she is aware she is responsible for securing confidentiality on her end of the session. Adriana Moon verbally consented to proceed.   Adriana Moon is a 39 y.o. female presenting via [place] for a follow-up appointment to address the previously established treatment goal of decreasing emotional eating. Today's appointment was a telepsychological visit due to COVID-19. Adriana Moon provided verbal consent for today's telepsychological appointment and she is aware she is responsible for securing confidentiality on her end of the session. Prior to proceeding with today's appointment, Zamia's physical location at the time of this appointment was obtained as well a phone number she could be reached at in the event of technical difficulties. Adriana Moon and this provider participated in today's  telepsychological service.   This provider conducted a brief check-in. Adriana Moon reported she went back to work. Physical and emotional hunger was reviewed. She shared a recent instance of emotional eating due to stress. As such, psychoeducation regarding triggers for emotional eating was provided. Adriana Moon was provided a handout, and encouraged to utilize the handout between now and the next appointment to increase awareness of triggers and frequency. Adriana Moon agreed. This provider also discussed behavioral strategies for specific triggers, such as placing the utensil down when conversing to avoid mindless eating. Adriana Moon provided verbal consent during today's appointment for this provider to send a handout about triggers via e-mail. She also discussed feeling overwhelmed and was observed becoming tearful. Thus, this provider and Adriana Moon briefly discussed what she could do to engage in self-care. She was receptive to creating a music play list for herself to listen to while driving to/from work, as well as exploring audio book options. Adriana Moon was receptive to today's appointment as evidenced by openness to sharing, responsiveness to feedback, and willingness to explore triggers for emotional eating.  Mental Status Examination:  Appearance: well groomed and appropriate hygiene  Behavior: appropriate to circumstances Mood: euthymic Affect: mood congruent Speech: normal in rate, volume, and tone Eye Contact: appropriate Psychomotor Activity: appropriate Gait: unable to assess Thought Process: linear, logical, and goal directed  Thought Content/Perception: no hallucinations, delusions, bizarre thinking or behavior reported or observed and no evidence of suicidal and homicidal ideation, plan, and intent Orientation: time, person, place and purpose of appointment Memory/Concentration: memory, attention, language, and fund of knowledge intact  Insight/Judgment: good  Interventions:  Conducted a  brief chart review Provided empathic reflections and validation Reviewed content from the previous session Employed supportive psychotherapy interventions to facilitate reduced distress and to improve coping skills with identified stressors Employed motivational interviewing skills to assess patient's willingness/desire to adhere to recommended  medical treatments and assignments Psychoeducation provided regarding triggers for emotional eating  DSM-5 Diagnoses: 311 (F32.8) Other Specified Depressive Disorder, Emotional Eating Behaviors and 300.00 (F41.9) Unspecified Anxiety Disorder  Treatment Goal & Progress: During the initial appointment with this provider, the following treatment goal was established: decrease emotional eating. Adriana Moon has demonstrated progress in her goal as evidenced by increased awareness of hunger patterns.   Plan: The next appointment will be scheduled in two weeks, which will be via [employer]. The next session will focus on working towards the established treatment goal.

## 2019-05-20 ENCOUNTER — Telehealth: Payer: Self-pay | Admitting: Adult Health

## 2019-05-20 ENCOUNTER — Inpatient Hospital Stay: Payer: BC Managed Care – PPO | Attending: Adult Health | Admitting: Adult Health

## 2019-05-20 ENCOUNTER — Encounter: Payer: Self-pay | Admitting: Adult Health

## 2019-05-20 DIAGNOSIS — C50411 Malignant neoplasm of upper-outer quadrant of right female breast: Secondary | ICD-10-CM

## 2019-05-20 DIAGNOSIS — Z170 Estrogen receptor positive status [ER+]: Secondary | ICD-10-CM

## 2019-05-20 DIAGNOSIS — I8311 Varicose veins of right lower extremity with inflammation: Secondary | ICD-10-CM | POA: Diagnosis not present

## 2019-05-20 DIAGNOSIS — I83891 Varicose veins of right lower extremities with other complications: Secondary | ICD-10-CM | POA: Diagnosis not present

## 2019-05-20 NOTE — Progress Notes (Signed)
No show.  Called patient and LMOM to reschedule.

## 2019-05-20 NOTE — Telephone Encounter (Signed)
Returned patient's phone call regarding rescheduling an appointment, left a voicemail. 

## 2019-05-21 DIAGNOSIS — Z9013 Acquired absence of bilateral breasts and nipples: Secondary | ICD-10-CM | POA: Diagnosis not present

## 2019-05-21 DIAGNOSIS — Z9889 Other specified postprocedural states: Secondary | ICD-10-CM | POA: Diagnosis not present

## 2019-05-21 DIAGNOSIS — C50919 Malignant neoplasm of unspecified site of unspecified female breast: Secondary | ICD-10-CM | POA: Diagnosis not present

## 2019-05-21 DIAGNOSIS — M546 Pain in thoracic spine: Secondary | ICD-10-CM | POA: Diagnosis not present

## 2019-05-22 DIAGNOSIS — I8311 Varicose veins of right lower extremity with inflammation: Secondary | ICD-10-CM | POA: Diagnosis not present

## 2019-05-27 ENCOUNTER — Encounter (INDEPENDENT_AMBULATORY_CARE_PROVIDER_SITE_OTHER): Payer: Self-pay | Admitting: Family Medicine

## 2019-05-27 ENCOUNTER — Ambulatory Visit (INDEPENDENT_AMBULATORY_CARE_PROVIDER_SITE_OTHER): Payer: BC Managed Care – PPO | Admitting: Family Medicine

## 2019-05-27 ENCOUNTER — Other Ambulatory Visit: Payer: Self-pay

## 2019-05-27 VITALS — BP 111/73 | HR 70 | Temp 98.5°F | Ht 60.0 in | Wt 250.0 lb

## 2019-05-27 DIAGNOSIS — C50411 Malignant neoplasm of upper-outer quadrant of right female breast: Secondary | ICD-10-CM | POA: Diagnosis not present

## 2019-05-27 DIAGNOSIS — E8881 Metabolic syndrome: Secondary | ICD-10-CM | POA: Diagnosis not present

## 2019-05-27 DIAGNOSIS — Z170 Estrogen receptor positive status [ER+]: Secondary | ICD-10-CM

## 2019-05-27 DIAGNOSIS — E559 Vitamin D deficiency, unspecified: Secondary | ICD-10-CM

## 2019-05-27 DIAGNOSIS — F3289 Other specified depressive episodes: Secondary | ICD-10-CM | POA: Diagnosis not present

## 2019-05-27 DIAGNOSIS — Z9189 Other specified personal risk factors, not elsewhere classified: Secondary | ICD-10-CM | POA: Diagnosis not present

## 2019-05-27 DIAGNOSIS — Z6841 Body Mass Index (BMI) 40.0 and over, adult: Secondary | ICD-10-CM

## 2019-05-27 NOTE — Progress Notes (Signed)
Chief Complaint:   OBESITY Adriana Moon is here to discuss her progress with her obesity treatment plan along with follow-up of her obesity related diagnoses. Adriana Moon is on the Category 1 Plan and states she is following her eating plan approximately 80% of the time. Adriana Moon states she is exercising for 0 minutes 0 times per week.  Today's visit was #: 3 Starting weight: 260 lbs Starting date: 04/28/2019 Today's weight: 250 lbs Today's date: 05/27/2019 Total lbs lost to date: 10 lbs Total lbs lost since last in-office visit: 0  Interim History: Adriana Moon reports taht she had [employer] for dinner last night.  She tried the cauliflower rice.  She says it was salty.  She struggles to get in all the protein in her diet.  Subjective:   1. Vitamin D deficiency Tanaiya's Vitamin D level was 22.2 on 04/28/2019. She is currently taking vit D. She denies nausea, vomiting or muscle weakness.  2. Insulin resistance Adriana Moon has a diagnosis of insulin resistance based on her elevated fasting insulin level >5. She continues to work on diet and exercise to decrease her risk of diabetes.  She is taking metformin 500 mg twice daily.  Lab Results  Component Value Date   INSULIN 19.4 04/28/2019   Lab Results  Component Value Date   HGBA1C 5.4 04/28/2019   3. Malignant neoplasm of upper-outer quadrant of right breast in female, estrogen receptor positive ([place]) Myrissa is currently taking tamoxifen 20 mg daily.  4. Other depression, with emotional eating Adriana Moon is struggling with emotional eating and using food for comfort to the extent that it is negatively impacting her health. She has been working on behavior modification techniques to help reduce her emotional eating and has been unsuccessful. She shows no sign of suicidal or homicidal ideations.  She has been to see Dr. Mallie Mussel.  5. At risk for osteoporosis Adriana Moon is at higher risk of osteopenia and osteoporosis due to Vitamin D deficiency.     Assessment/Plan:   1. Vitamin D deficiency Low Vitamin D level contributes to fatigue and are associated with obesity, breast, and colon cancer. She agrees to continue to take prescription Vitamin D @50 ,000 IU every week and will follow-up for routine testing of Vitamin D, at least 2-3 times per year to avoid over-replacement.  2. Insulin resistance Naydean will continue to work on weight loss, exercise, and decreasing simple carbohydrates to help decrease the risk of diabetes. Adriana Moon agreed to follow-up with [name] as directed to closely monitor her progress.  3. Malignant neoplasm of upper-outer quadrant of right breast in female, estrogen receptor positive ([place]) Will follow.  4. Other depression, with emotional eating Behavior modification techniques were discussed today to help Kehinde deal with her emotional/non-hunger eating behaviors.  Orders and follow up as documented in patient record.   5. At risk for osteoporosis Marisue was given approximately 15 minutes of osteoporosis prevention counseling today. Adriana Moon is at risk for osteopenia and osteoporosis due to her Vitamin D deficiency. She was encouraged to take her Vitamin D and follow her higher calcium diet and increase strengthening exercise to help strengthen her bones and decrease her risk of osteopenia and osteoporosis.  Repetitive spaced learning was employed today to elicit superior memory formation and behavioral change.  6. Class 3 severe obesity with serious comorbidity and body mass index (BMI) of 45.0 to 49.9 in adult, unspecified obesity type (HCC) Adriana Moon is currently in the action stage of change. As such, her goal is to continue with  weight loss efforts. She has agreed to keeping a food journal and adhering to recommended goals of 1000 calories and 75-85+ grams of protein.   Exercise goals: For substantial health benefits, adults should do at least 150 minutes (2 hours and 30 minutes) a week of  moderate-intensity, or 75 minutes (1 hour and 15 minutes) a week of vigorous-intensity aerobic physical activity, or an equivalent combination of moderate- and vigorous-intensity aerobic activity. Aerobic activity should be performed in episodes of at least 10 minutes, and preferably, it should be spread throughout the week. Adults should also include muscle-strengthening activities that involve all major muscle groups on 2 or more days a week.  Behavioral modification strategies: increasing lean protein intake and increasing water intake.  Zymia has agreed to follow-up with our clinic in 2 weeks. She was informed of the importance of frequent follow-up visits to maximize her success with intensive lifestyle modifications for her multiple health conditions.   Objective:   Blood pressure 111/73, pulse 70, temperature 98.5 F (36.9 C), temperature source Oral, height 5' (1.524 m), weight 250 lb (113.4 kg), SpO2 100 %. Body mass index is 48.82 kg/m.  General: Cooperative, alert, well developed, in no acute distress. HEENT: Conjunctivae and lids unremarkable. Cardiovascular: Regular rhythm.  Lungs: Normal work of breathing. Neurologic: No focal deficits.   Lab Results  Component Value Date   CREATININE 1.06 (H) 04/28/2019   BUN 16 04/28/2019   NA 142 04/28/2019   K 4.2 04/28/2019   CL 105 04/28/2019   CO2 26 04/28/2019   Lab Results  Component Value Date   ALT 7 04/28/2019   AST 7 04/28/2019   ALKPHOS 38 (L) 04/28/2019   BILITOT 0.5 04/28/2019   Lab Results  Component Value Date   HGBA1C 5.4 04/28/2019   HGBA1C 5.5 04/20/2016   Lab Results  Component Value Date   INSULIN 19.4 04/28/2019   Lab Results  Component Value Date   TSH 2.630 04/28/2019   Lab Results  Component Value Date   CHOL 170 04/28/2019   HDL 80 04/28/2019   LDLCALC 80 04/28/2019   TRIG 47 04/28/2019   CHOLHDL 2.3 05/08/2017   Lab Results  Component Value Date   WBC 7.5 04/28/2019   HGB 12.5  04/28/2019   HCT 37.8 04/28/2019   MCV 86 04/28/2019   PLT 338 04/28/2019   Lab Results  Component Value Date   IRON 82 04/28/2019   TIBC 315 04/28/2019   FERRITIN 153 (H) 04/28/2019   Attestation Statements:   Reviewed by clinician on day of visit: allergies, medications, problem list, medical history, surgical history, family history, social history, and previous encounter notes.  I, Water quality scientist, CMA, am acting as Location manager for [employer], DO.  I have reviewed the above documentation for accuracy and completeness, and I agree with the above. Briscoe Deutscher, DO

## 2019-06-02 ENCOUNTER — Ambulatory Visit (INDEPENDENT_AMBULATORY_CARE_PROVIDER_SITE_OTHER): Payer: BC Managed Care – PPO | Admitting: Psychology

## 2019-06-02 ENCOUNTER — Other Ambulatory Visit: Payer: Self-pay

## 2019-06-02 DIAGNOSIS — F419 Anxiety disorder, unspecified: Secondary | ICD-10-CM | POA: Diagnosis not present

## 2019-06-02 DIAGNOSIS — F3289 Other specified depressive episodes: Secondary | ICD-10-CM | POA: Diagnosis not present

## 2019-06-04 NOTE — Progress Notes (Signed)
Office: [phone]  /  Fax: [phone]    Date: June 18, 2019   Appointment Start Time: 4:34pm Duration: 26 minutes Provider: Glennie Isle, Psy.D. Type of Session: Individual Therapy  Location of Patient: Parked in car at home Location of Provider: Provider's Home Type of Contact: Telepsychological Visit via [employer]  Session Content: Adriana Moon is a 39 y.o. female presenting via [place] for a follow-up appointment to address the previously established treatment goal of decreasing emotional eating. Today's appointment was a telepsychological visit due to COVID-19. Adriana Moon provided verbal consent for today's telepsychological appointment and she is aware she is responsible for securing confidentiality on her end of the session. Prior to proceeding with today's appointment, [name]'s physical location at the time of this appointment was obtained as well a phone number she could be reached at in the event of technical difficulties. Adriana Moon and this provider participated in today's telepsychological service.   This provider conducted a brief check-in. [name] shared about recent events, adding she has focused some on self-care. Positive reinforcement was provided. Regarding eating, she acknowledged she is not eating enough from the plan and engaged in emotional eating yesterday due to work stress. This was explored further. It was reflected she is not eating enough protein which is likely contributing to increased hunger and subsequent emotional eating. This provider and [name] explored ways to increase protein intake. She was receptive to taking food options (e.g., yogurt, protein cereal) to work with her. Moreover, psychoeducation regarding pleasurable activities, including its impact on emotional eating and overall well-being was provided. [name] was provided with a handout with various options of pleasurable activities, and was encouraged to engage in one activity a day and  additional activities as needed when triggered to emotionally eat. Adriana Moon agreed. Adriana Moon provided verbal consent during today's appointment for this provider to send a handout with pleasurable activities via e-mail. Overall, [name] was receptive to today's appointment as evidenced by openness to sharing, responsiveness to feedback, and willingness to engage in pleasurable activities to assist with coping.  Mental Status Examination:  Appearance: well groomed and appropriate hygiene  Behavior: appropriate to circumstances Mood: euthymic Affect: mood congruent Speech: normal in rate, volume, and tone Eye Contact: appropriate Psychomotor Activity: appropriate Gait: unable to assess Thought Process: linear, logical, and goal directed  Thought Content/Perception: no hallucinations, delusions, bizarre thinking or behavior reported or observed and no evidence of suicidal and homicidal ideation, plan, and intent Orientation: time, person, place and purpose of appointment Memory/Concentration: memory, attention, language, and fund of knowledge intact  Insight/Judgment: good  Interventions:  Conducted a brief chart review Provided empathic reflections and validation Reviewed content from the previous session Employed supportive psychotherapy interventions to facilitate reduced distress and to improve coping skills with identified stressors Employed motivational interviewing skills to assess patient's willingness/desire to adhere to recommended medical treatments and assignments Engaged patient in problem solving Psychoeducation provided regarding pleasurable activities  DSM-5 Diagnoses: 311 (F32.8) Other Specified Depressive Disorder, Emotional Eating Behaviors and 300.00 (F41.9) Unspecified Anxiety Disorder  Treatment Goal & Progress: During the initial appointment with this provider, the following treatment goal was established: decrease emotional eating. [name] has demonstrated progress  in her goal as evidenced by increased awareness of hunger patterns and increased awareness of triggers for emotional eating. [name] also demonstrates willingness to engage in pleasurable activities.  Plan: The next appointment will be scheduled in approximately two weeks, which will be via [employer]. The next session will focus on working towards the established treatment  goal.

## 2019-06-08 ENCOUNTER — Ambulatory Visit (INDEPENDENT_AMBULATORY_CARE_PROVIDER_SITE_OTHER): Payer: BC Managed Care – PPO | Admitting: Family Medicine

## 2019-06-08 ENCOUNTER — Encounter (INDEPENDENT_AMBULATORY_CARE_PROVIDER_SITE_OTHER): Payer: Self-pay | Admitting: Family Medicine

## 2019-06-08 ENCOUNTER — Other Ambulatory Visit: Payer: Self-pay

## 2019-06-08 ENCOUNTER — Other Ambulatory Visit: Payer: Self-pay | Admitting: Plastic Surgery

## 2019-06-08 VITALS — BP 120/87 | HR 81 | Temp 98.1°F | Ht 60.0 in | Wt 245.0 lb

## 2019-06-08 DIAGNOSIS — E559 Vitamin D deficiency, unspecified: Secondary | ICD-10-CM

## 2019-06-08 DIAGNOSIS — F32A Depression, unspecified: Secondary | ICD-10-CM | POA: Insufficient documentation

## 2019-06-08 DIAGNOSIS — E88819 Insulin resistance, unspecified: Secondary | ICD-10-CM | POA: Insufficient documentation

## 2019-06-08 DIAGNOSIS — F3289 Other specified depressive episodes: Secondary | ICD-10-CM | POA: Diagnosis not present

## 2019-06-08 DIAGNOSIS — N951 Menopausal and female climacteric states: Secondary | ICD-10-CM | POA: Diagnosis not present

## 2019-06-08 DIAGNOSIS — Z9189 Other specified personal risk factors, not elsewhere classified: Secondary | ICD-10-CM

## 2019-06-08 DIAGNOSIS — E8881 Metabolic syndrome: Secondary | ICD-10-CM | POA: Diagnosis not present

## 2019-06-08 DIAGNOSIS — F419 Anxiety disorder, unspecified: Secondary | ICD-10-CM | POA: Insufficient documentation

## 2019-06-08 DIAGNOSIS — F329 Major depressive disorder, single episode, unspecified: Secondary | ICD-10-CM | POA: Insufficient documentation

## 2019-06-08 DIAGNOSIS — Z6841 Body Mass Index (BMI) 40.0 and over, adult: Secondary | ICD-10-CM

## 2019-06-08 MED ORDER — VITAMIN D (ERGOCALCIFEROL) 1.25 MG (50000 UNIT) PO CAPS: 50000.0000 [IU] | ORAL_CAPSULE | ORAL | 0 refills | Status: DC

## 2019-06-08 MED ORDER — METFORMIN HCL 500 MG PO TABS: 500.0000 mg | ORAL_TABLET | Freq: Two times a day (BID) | ORAL | 11 refills | Status: DC

## 2019-06-09 ENCOUNTER — Ambulatory Visit (INDEPENDENT_AMBULATORY_CARE_PROVIDER_SITE_OTHER): Payer: BC Managed Care – PPO | Admitting: Plastic Surgery

## 2019-06-09 ENCOUNTER — Encounter: Payer: Self-pay | Admitting: Plastic Surgery

## 2019-06-09 VITALS — BP 133/73 | HR 79 | Temp 97.3°F | Ht 60.0 in | Wt 252.2 lb

## 2019-06-09 DIAGNOSIS — C50511 Malignant neoplasm of lower-outer quadrant of right female breast: Secondary | ICD-10-CM

## 2019-06-09 DIAGNOSIS — Z170 Estrogen receptor positive status [ER+]: Secondary | ICD-10-CM

## 2019-06-09 DIAGNOSIS — Z9889 Other specified postprocedural states: Secondary | ICD-10-CM

## 2019-06-09 DIAGNOSIS — Z9013 Acquired absence of bilateral breasts and nipples: Secondary | ICD-10-CM

## 2019-06-09 DIAGNOSIS — C50411 Malignant neoplasm of upper-outer quadrant of right female breast: Secondary | ICD-10-CM

## 2019-06-09 NOTE — Progress Notes (Signed)
   Subjective:    Patient ID: Adriana Moon, female    DOB: [birth year], 39 y.o.   MRN: [id number]  She is a 39 year old female here for follow-up on her bilateral reconstruction.  She had bilateral mastectomies with expanders placed.  In December she had removal of the expanders and placement of implants.  She is very happy with her progress to this point.  She is also lost 15 pounds in the last several months.  She is still interested in the possibility of a Diep flap and is going to continue with her weight loss to work towards that goal.  Her incisions are very nicely healed.  There is no sign of hematoma or seroma.  She has a little bit of scar contracture on the medial aspect of the right breast.  She is happy to wait to do anything for right now.  I think that is marked.    Review of Systems  Constitutional: Negative.   Eyes: Negative.   Respiratory: Negative.   Cardiovascular: Negative.   Genitourinary: Negative.   Musculoskeletal: Negative.        Objective:   Physical Exam Vitals and nursing note reviewed.  Constitutional:      Appearance: Normal appearance.  HENT:     Head: Normocephalic and atraumatic.  Cardiovascular:     Rate and Rhythm: Normal rate.     Pulses: Normal pulses.  Pulmonary:     Effort: Pulmonary effort is normal.  Neurological:     General: No focal deficit present.     Mental Status: She is alert and oriented to person, place, and time.  Psychiatric:        Mood and Affect: Mood normal.        Behavior: Behavior normal.       Assessment & Plan:     ICD-10-CM   1. Malignant neoplasm of upper-outer quadrant of right breast in female, estrogen receptor positive ([place])  C50.411    Z17.0   2. Malignant neoplasm of lower-outer quadrant of right breast of female, estrogen receptor positive ([place])  C50.511    Z17.0   3. Acquired absence of both breasts  Z90.13   4. S/P breast reconstruction, bilateral  Z98.890      I am so pleased  with the patient's progress.  She seems to be doing really well.  She is back at work as well.  We will continue to support the weight loss and hold off on anything surgical at this point in time.  I had like to see her back in 6 months for follow-up.  Her Valium was refilled.  If she has any further discomfort she should try Tylenol.  She has the allergy to NSAIDs.  Pictures were obtained of the patient and placed in the chart with the patient's or guardian's permission.  The [place] was signed into law in 2016 which includes the topic of electronic health records.  This provides immediate access to information in MyChart.  This includes consultation notes, operative notes, office notes, lab results and pathology reports.  If you have any questions about what you read please let us know at your next visit or call us at the office.  We are right here with you.

## 2019-06-09 NOTE — Progress Notes (Signed)
Chief Complaint:   OBESITY Syeira is here to discuss her progress with her obesity treatment plan along with follow-up of her obesity related diagnoses. Teniah is on the Category 1 Plan and states she is following her eating plan approximately 80% of the time. Rain states she is doing cardio and light weights for 60 minutes 3 times per week.  Today's visit was #: 4 Starting weight: 260 lbs Starting date: 04/28/2019 Today's weight: 245 lbs Today's date: 06/08/2019 Total lbs lost to date: 15 lbs Total lbs lost since last in-office visit: 5 lbs  Interim History: Aarya has lost 15 pounds thus far.  She says she "feels great".  She wants to start intermittent fasting (does it naturally).  Subjective:   1. Insulin resistance Benay has a diagnosis of insulin resistance based on her elevated fasting insulin level >5. She continues to work on diet and exercise to decrease her risk of diabetes.  She is taking metformin 500 mg twice daily.  Lab Results  Component Value Date   INSULIN 19.4 04/28/2019   Lab Results  Component Value Date   HGBA1C 5.4 04/28/2019   2. Vitamin D deficiency Janeice's Vitamin D level was 22.2 on 04/28/2019. She is currently taking vit D. She denies nausea, vomiting or muscle weakness.  3. Menopausal vasomotor syndrome, due to Tamoxifen Milaya has some vasomotor symptoms.  4. Other Specified Depressive Disorder, Emotional Eating Behaviors Lakitta is struggling with emotional eating and using food for comfort to the extent that it is negatively impacting her health. She has been working on behavior modification techniques to help reduce her emotional eating and has been unsuccessful. She shows no sign of suicidal or homicidal ideations.  5. At risk for diabetes mellitus Suzon is at higher than average risk for developing diabetes due to her obesity.   Assessment/Plan:   1. Insulin resistance Lynnell will continue to work on weight loss,  exercise, and decreasing simple carbohydrates to help decrease the risk of diabetes. Greidy agreed to follow-up with [name] as directed to closely monitor her progress.  2. Vitamin D deficiency Low Vitamin D level contributes to fatigue and are associated with obesity, breast, and colon cancer. She agrees to continue to take prescription Vitamin D @50 ,000 IU every week and will follow-up for routine testing of Vitamin D, at least 2-3 times per year to avoid over-replacement.  Orders - Vitamin D, Ergocalciferol, (DRISDOL) 1.25 MG (50000 UNIT) CAPS capsule; Take 1 capsule (50,000 Units total) by mouth every 7 (seven) days.  Dispense: 4 capsule; Refill: 0  3. Menopausal vasomotor syndrome, due to Tamoxifen Will monitor.  No changes.  4. Other Specified Depressive Disorder, Emotional Eating Behaviors Behavior modification techniques were discussed today to help Polly deal with her emotional/non-hunger eating behaviors.  Orders and follow up as documented in patient record.  Patient was referred to Dr. Mallie Mussel, our Bariatric Psychologist, for evaluation due to her elevated PHQ-9 score and significant struggles with emotional eating.  5. At risk for diabetes mellitus Jamisyn was given approximately 15 minutes of diabetes education and counseling today. We discussed intensive lifestyle modifications today with an emphasis on weight loss as well as increasing exercise and decreasing simple carbohydrates in her diet. We also reviewed medication options with an emphasis on risk versus benefit of those discussed.   Repetitive spaced learning was employed today to elicit superior memory formation and behavioral change.  6. Class 3 severe obesity with serious comorbidity and body mass index (BMI) of 45.0  to 49.9 in adult, unspecified obesity type [facility]) Oniyah is currently in the action stage of change. As such, her goal is to continue with weight loss efforts. She has agreed to keeping a food journal and  adhering to recommended goals of 1000 calories and 75+ grams of protein.   Exercise goals: HIIT - treadmill/walking/bike  Behavioral modification strategies: increasing lean protein intake.  Tymeshia has agreed to follow-up with our clinic in 2 weeks. She was informed of the importance of frequent follow-up visits to maximize her success with intensive lifestyle modifications for her multiple health conditions.   Objective:   Blood pressure 120/87, pulse 81, temperature 98.1 F (36.7 C), temperature source Oral, height 5' (1.524 m), weight 245 lb (111.1 kg), SpO2 97 %. Body mass index is 47.85 kg/m.  General: Cooperative, alert, well developed, in no acute distress. HEENT: Conjunctivae and lids unremarkable. Cardiovascular: Regular rhythm.  Lungs: Normal work of breathing. Neurologic: No focal deficits.   Lab Results  Component Value Date   CREATININE 1.06 (H) 04/28/2019   BUN 16 04/28/2019   NA 142 04/28/2019   K 4.2 04/28/2019   CL 105 04/28/2019   CO2 26 04/28/2019   Lab Results  Component Value Date   ALT 7 04/28/2019   AST 7 04/28/2019   ALKPHOS 38 (L) 04/28/2019   BILITOT 0.5 04/28/2019   Lab Results  Component Value Date   HGBA1C 5.4 04/28/2019   HGBA1C 5.5 04/20/2016   Lab Results  Component Value Date   INSULIN 19.4 04/28/2019   Lab Results  Component Value Date   TSH 2.630 04/28/2019   Lab Results  Component Value Date   CHOL 170 04/28/2019   HDL 80 04/28/2019   LDLCALC 80 04/28/2019   TRIG 47 04/28/2019   CHOLHDL 2.3 05/08/2017   Lab Results  Component Value Date   WBC 7.5 04/28/2019   HGB 12.5 04/28/2019   HCT 37.8 04/28/2019   MCV 86 04/28/2019   PLT 338 04/28/2019   Lab Results  Component Value Date   IRON 82 04/28/2019   TIBC 315 04/28/2019   FERRITIN 153 (H) 04/28/2019   Obesity Behavioral Intervention:   Approximately 15 minutes were spent on the discussion below.  ASK: We discussed the diagnosis of obesity with [name]  today and Jocabed agreed to give [name] permission to discuss obesity behavioral modification therapy today.  ASSESS: Chelby has the diagnosis of obesity and her BMI today is 48.0. Laporche is in the action stage of change.   ADVISE: Melandie was educated on the multiple health risks of obesity as well as the benefit of weight loss to improve her health. She was advised of the need for long term treatment and the importance of lifestyle modifications to improve her current health and to decrease her risk of future health problems.  AGREE: Multiple dietary modification options and treatment options were discussed and Makendra agreed to follow the recommendations documented in the above note.  ARRANGE: Kerissa was educated on the importance of frequent visits to treat obesity as outlined per CMS and USPSTF guidelines and agreed to schedule her next follow up appointment today.  Attestation Statements:   Reviewed by clinician on day of visit: allergies, medications, problem list, medical history, surgical history, family history, social history, and previous encounter notes.  I, Water quality scientist, CMA, am acting as Location manager for [employer], DO.  I have reviewed the above documentation for accuracy and completeness, and I agree with the above. Danae Chen  Juleen China, DO

## 2019-06-12 ENCOUNTER — Other Ambulatory Visit: Payer: Self-pay

## 2019-06-12 ENCOUNTER — Encounter: Payer: Self-pay | Admitting: Family Medicine

## 2019-06-12 ENCOUNTER — Telehealth (INDEPENDENT_AMBULATORY_CARE_PROVIDER_SITE_OTHER): Payer: BC Managed Care – PPO | Admitting: Family Medicine

## 2019-06-12 DIAGNOSIS — J4520 Mild intermittent asthma, uncomplicated: Secondary | ICD-10-CM | POA: Diagnosis not present

## 2019-06-12 DIAGNOSIS — F418 Other specified anxiety disorders: Secondary | ICD-10-CM

## 2019-06-12 MED ORDER — HYDROXYZINE HCL 25 MG PO TABS: 25.0000 mg | ORAL_TABLET | Freq: Three times a day (TID) | ORAL | 11 refills | Status: DC | PRN

## 2019-06-12 MED ORDER — FLUTICASONE FUROATE-VILANTEROL 100-25 MCG/INH IN AEPB: 1.0000 | INHALATION_SPRAY | Freq: Every day | RESPIRATORY_TRACT | 11 refills | Status: DC

## 2019-06-12 MED ORDER — ALBUTEROL SULFATE HFA 108 (90 BASE) MCG/ACT IN AERS: 2.0000 | INHALATION_SPRAY | Freq: Four times a day (QID) | RESPIRATORY_TRACT | 11 refills | Status: DC | PRN

## 2019-06-12 NOTE — Progress Notes (Signed)
Virtual Visit via MyChart Video Note Due to COVID-19 pandemic this visit was conducted virtually. This visit type was conducted due to national recommendations for restrictions regarding the COVID-19 Pandemic (e.g. social distancing, sheltering in place) in an effort to limit this patient's exposure and mitigate transmission in our community. All issues noted in this document were discussed and addressed.  A physical exam was not performed with this format.   I connected with Adriana Moon on 06/12/2019 at 1530 by MyChart Video and verified that I am speaking with the correct person using two identifiers. Maudell Korby is currently located at work and no one is currently with them during visit. The provider, Monia Pouch, FNP is located in their home office at time of visit.  I discussed the limitations, risks, security and privacy concerns of performing an evaluation and management service by video and the availability of in person appointments. I also discussed with the patient that there may be a patient responsible charge related to this service. The patient expressed understanding and agreed to proceed.  Subjective:  Patient ID: Adriana Moon, female    DOB: 06/12/1980, 39 y.o.   MRN: [id number]  Chief Complaint:  Medical Management of Chronic Issues   HPI: Adriana Moon is a 39 y.o. female presenting on 06/12/2019 for Medical Management of Chronic Issues   Pt following up today for a refill of her inhalers and atarax. Pt states she has been using the atarax as needed for anxiety. States it works very well without associated side effects. States she has been taking this for a while as needed for acute anxiety. No recent panic attacks. States she is also out of her inhalers and needs them refilled. She uses the albuterol as needed and has been using Breo as prescribed. She states her asthma is well controlled.     Relevant past  medical, surgical, family, and social history reviewed and updated as indicated.  Allergies and medications reviewed and updated.   Past Medical History:  Diagnosis Date  . Angio-edema   . Anxiety    manages naturally  . Arthritis of both knees   . Asthma   . Bilateral swelling of feet   . Breast cancer ([place])   . Breast disorder    cancer  . Depression    pt. reports that she tends to get down & has had Lexapro in the past but its been taken off her list for now.   . Family history of breast cancer   . Family history of kidney cancer   . Family history of lung cancer   . Family history of prostate cancer   . GERD (gastroesophageal reflux disease)   . History of kidney problems   . IBS (irritable bowel syndrome)   . Joint pain   . Lactose intolerance   . Migraines   . PCOS (polycystic ovarian syndrome)   . Urticaria   . Varicose veins of legs     Past Surgical History:  Procedure Laterality Date  . BREAST LUMPECTOMY Right 2009  . BREAST RECONSTRUCTION WITH PLACEMENT OF TISSUE EXPANDER AND FLEX HD (ACELLULAR HYDRATED DERMIS) Bilateral 12/22/2018   Procedure: BILATERAL BREAST RECONSTRUCTION WITH PLACEMENT OF TISSUE EXPANDER AND FLEX HD (ACELLULAR HYDRATED DERMIS);  Surgeon: Wallace Going, DO;  Location: [facility];  Service: Plastics;  Laterality: Bilateral;  . BREAST REDUCTION SURGERY Bilateral 01/25/2015   Procedure: MAMMARY REDUCTION  (BREAST);  Surgeon: [name], MD;  Location: [facility];  Service: Plastics;  Laterality: Bilateral;  . CESAREAN SECTION     x2  2002 & 2006  . ECTOPIC PREGNANCY SURGERY  06/2018   R side , done at [facility]    . MASTECTOMY W/ SENTINEL NODE BIOPSY Bilateral 12/22/2018   with tissue expanders  . MASTECTOMY W/ SENTINEL NODE BIOPSY Bilateral 12/22/2018   Procedure: BILATERAL MASTECTOMIES WITH RIGHT SENTINEL LYMPH NODE BIOPSY;  Surgeon: Coralie Keens, MD;  Location: [facility];  Service:  General;  Laterality: Bilateral;  . REDUCTION MAMMAPLASTY Left   . REMOVAL OF BILATERAL TISSUE EXPANDERS WITH PLACEMENT OF BILATERAL BREAST IMPLANTS Bilateral 04/06/2019   Procedure: REMOVAL OF BILATERAL TISSUE EXPANDERS WITH PLACEMENT OF BILATERAL BREAST IMPLANTS;  Surgeon: Wallace Going, DO;  Location: [facility];  Service: Plastics;  Laterality: Bilateral;  . RT lumpectomy      Social History   Socioeconomic History  . Marital status: Legally Separated    Spouse name: Not on file  . Number of children: 2  . Years of education: Not on file  . Highest education level: Not on file  Occupational History  . Not on file  Tobacco Use  . Smoking status: Never Smoker  . Smokeless tobacco: Never Used  Substance and Sexual Activity  . Alcohol use: Yes    Alcohol/week: 0.0 standard drinks    Comment: occ. - socially  . Drug use: No  . Sexual activity: Yes    Birth control/protection: None, Surgical  Other Topics Concern  . Not on file  Social History Narrative  . Not on file   Social Determinants of Health   Financial Resource Strain:   . Difficulty of Paying Living Expenses: Not on file  Food Insecurity:   . Worried About Charity fundraiser in the Last Year: Not on file  . Ran Out of Food in the Last Year: Not on file  Transportation Needs:   . Lack of Transportation (Medical): Not on file  . Lack of Transportation (Non-Medical): Not on file  Physical Activity:   . Days of Exercise per Week: Not on file  . Minutes of Exercise per Session: Not on file  Stress:   . Feeling of Stress : Not on file  Social Connections:   . Frequency of Communication with Friends and Family: Not on file  . Frequency of Social Gatherings with Friends and Family: Not on file  . Attends Religious Services: Not on file  . Active Member of Clubs or Organizations: Not on file  . Attends Archivist Meetings: Not on file  . Marital Status: Not on file  Intimate Partner Violence:   .  Fear of Current or Ex-Partner: Not on file  . Emotionally Abused: Not on file  . Physically Abused: Not on file  . Sexually Abused: Not on file    Outpatient Encounter Medications as of 06/12/2019  Medication Sig  . acetaminophen (TYLENOL) 500 MG tablet Take 1,000 mg by mouth every 6 (six) hours as needed for moderate pain or headache.  . albuterol (VENTOLIN HFA) 108 (90 Base) MCG/ACT inhaler Inhale 2 puffs into the lungs every 6 (six) hours as needed for wheezing or shortness of breath.  . APPLE CIDER VINEGAR PO Take 1 capsule by mouth 3 (three) times daily.  . cetirizine (ZYRTEC) 10 MG tablet Take 1-2 tablets 1-2 times a day as needed (Patient taking differently: Take 10 mg by mouth every evening. )  . cyclobenzaprine (  FLEXERIL) 10 MG tablet Take 1 tablet (10 mg total) by mouth 2 (two) times daily.  . diazepam (VALIUM) 2 MG tablet Take 1 tablet (2 mg total) by mouth every 6 (six) hours as needed for anxiety.  . fluconazole (DIFLUCAN) 150 MG tablet Take 1 now and repeat 1 in 3 days then 1 every month before period (Patient taking differently: Take 150 mg by mouth every 30 (thirty) days. every month before period)  . fluticasone (FLONASE) 50 MCG/ACT nasal spray Place 2 sprays into both nostrils daily.  . fluticasone furoate-vilanterol (BREO ELLIPTA) 100-25 MCG/INH AEPB Inhale 1 puff into the lungs daily.  . hydrOXYzine (ATARAX/VISTARIL) 25 MG tablet Take 1 tablet (25 mg total) by mouth 3 (three) times daily as needed for anxiety or itching.  . metFORMIN (GLUCOPHAGE) 500 MG tablet Take 1 tablet (500 mg total) by mouth 2 (two) times daily.  . Multiple Vitamin (MULTIVITAMIN WITH MINERALS) TABS tablet Take 1 tablet by mouth daily.  Marland Kitchen omeprazole (PRILOSEC) 40 MG capsule Take 1 capsule (40 mg total) by mouth at bedtime. TAKE 1 CAPSULE BY MOUTH ONCE DAILY** NEEDS  TO BE SEEN  BEFORE  NEXT  REFILL**  . Spacer/Aero Chamber Mouthpiece MISC 1 each by Does not apply route every 6 (six) hours as needed.  .  tamoxifen (NOLVADEX) 20 MG tablet Take 1 tablet (20 mg total) by mouth daily. (Patient taking differently: Take 20 mg by mouth at bedtime. )  . Vitamin D, Ergocalciferol, (DRISDOL) 1.25 MG (50000 UNIT) CAPS capsule Take 1 capsule (50,000 Units total) by mouth every 7 (seven) days.  . [DISCONTINUED] albuterol (PROVENTIL HFA;VENTOLIN HFA) 108 (90 Base) MCG/ACT inhaler Inhale 2 puffs into the lungs every 6 (six) hours as needed for wheezing or shortness of breath.  . [DISCONTINUED] hydrOXYzine (ATARAX/VISTARIL) 25 MG tablet Take 25 mg by mouth 3 (three) times daily as needed for anxiety or itching.   No facility-administered encounter medications on file as of 06/12/2019.    Allergies  Allergen Reactions  . Prednisone     Lost taste in her mouth, also makes "me crazy & mean"   . Tolmetin Hives and Swelling  . Nsaids Hives and Swelling  . Shellfish Allergy Hives and Swelling    Review of Systems  Constitutional: Negative for activity change, appetite change, chills, diaphoresis, fatigue, fever and unexpected weight change.  HENT: Negative.   Eyes: Negative.  Negative for photophobia and visual disturbance.  Respiratory: Positive for wheezing (intermittent). Negative for cough, chest tightness and shortness of breath.   Cardiovascular: Negative for chest pain, palpitations and leg swelling.  Gastrointestinal: Negative for abdominal pain, blood in stool, constipation, diarrhea, nausea and vomiting.  Endocrine: Negative.   Genitourinary: Negative for decreased urine volume, difficulty urinating, dysuria, frequency and urgency.  Musculoskeletal: Negative for arthralgias and myalgias.  Skin: Negative.   Allergic/Immunologic: Negative.   Neurological: Negative for dizziness, tremors, seizures, syncope, facial asymmetry, speech difficulty, weakness, light-headedness, numbness and headaches.  Hematological: Negative.   Psychiatric/Behavioral: Negative for confusion, hallucinations, sleep  disturbance and suicidal ideas.  All other systems reviewed and are negative.        Observations/Objective: No vital signs or physical exam, this was a telephone or virtual health encounter.  Pt alert and oriented, answers all questions appropriately, and able to speak in full sentences.    Assessment and Plan: Alianis was seen today for medical management of chronic issues.  Diagnoses and all orders for this visit:  Situational anxiety Doing well on below,  will continue.  -     hydrOXYzine (ATARAX/VISTARIL) 25 MG tablet; Take 1 tablet (25 mg total) by mouth 3 (three) times daily as needed for anxiety or itching.  Mild intermittent asthma without complication Doing well on below, will continue.  -     albuterol (VENTOLIN HFA) 108 (90 Base) MCG/ACT inhaler; Inhale 2 puffs into the lungs every 6 (six) hours as needed for wheezing or shortness of breath. -     fluticasone furoate-vilanterol (BREO ELLIPTA) 100-25 MCG/INH AEPB; Inhale 1 puff into the lungs daily.     Follow Up Instructions: Return in about 3 months (around 09/09/2019), or if symptoms worsen or fail to improve.    I discussed the assessment and treatment plan with the patient. The patient was provided an opportunity to ask questions and all were answered. The patient agreed with the plan and demonstrated an understanding of the instructions.   The patient was advised to call back or seek an in-person evaluation if the symptoms worsen or if the condition fails to improve as anticipated.  The above assessment and management plan was discussed with the patient. The patient verbalized understanding of and has agreed to the management plan. Patient is aware to call the clinic if they develop any new symptoms or if symptoms persist or worsen. Patient is aware when to return to the clinic for a follow-up visit. Patient educated on when it is appropriate to go to the emergency department.    I provided 15 minutes of video  non-face-to-face time during this encounter. The video started at 1530. The video ended at 1545. The other time was used for coordination of care.    Monia Pouch, FNP-C [facility] [address] [place], [place] [address] [phone] 06/12/2019

## 2019-06-18 ENCOUNTER — Other Ambulatory Visit: Payer: Self-pay

## 2019-06-18 ENCOUNTER — Ambulatory Visit (INDEPENDENT_AMBULATORY_CARE_PROVIDER_SITE_OTHER): Payer: BC Managed Care – PPO | Admitting: Psychology

## 2019-06-18 DIAGNOSIS — F3289 Other specified depressive episodes: Secondary | ICD-10-CM | POA: Diagnosis not present

## 2019-06-18 DIAGNOSIS — F419 Anxiety disorder, unspecified: Secondary | ICD-10-CM | POA: Diagnosis not present

## 2019-06-18 DIAGNOSIS — F5089 Other specified eating disorder: Secondary | ICD-10-CM | POA: Diagnosis not present

## 2019-06-19 ENCOUNTER — Other Ambulatory Visit: Payer: Self-pay | Admitting: Family Medicine

## 2019-06-19 DIAGNOSIS — J4520 Mild intermittent asthma, uncomplicated: Secondary | ICD-10-CM

## 2019-06-22 ENCOUNTER — Encounter (INDEPENDENT_AMBULATORY_CARE_PROVIDER_SITE_OTHER): Payer: Self-pay | Admitting: Family Medicine

## 2019-06-22 ENCOUNTER — Ambulatory Visit (INDEPENDENT_AMBULATORY_CARE_PROVIDER_SITE_OTHER): Payer: BC Managed Care – PPO | Admitting: Family Medicine

## 2019-06-22 ENCOUNTER — Other Ambulatory Visit: Payer: Self-pay

## 2019-06-22 VITALS — BP 125/83 | HR 72 | Temp 98.3°F | Ht 60.0 in | Wt 243.0 lb

## 2019-06-22 DIAGNOSIS — E559 Vitamin D deficiency, unspecified: Secondary | ICD-10-CM | POA: Diagnosis not present

## 2019-06-22 DIAGNOSIS — Z9189 Other specified personal risk factors, not elsewhere classified: Secondary | ICD-10-CM | POA: Diagnosis not present

## 2019-06-22 DIAGNOSIS — F3289 Other specified depressive episodes: Secondary | ICD-10-CM | POA: Diagnosis not present

## 2019-06-22 DIAGNOSIS — E8881 Metabolic syndrome: Secondary | ICD-10-CM

## 2019-06-22 DIAGNOSIS — R1013 Epigastric pain: Secondary | ICD-10-CM | POA: Diagnosis not present

## 2019-06-22 DIAGNOSIS — E88819 Insulin resistance, unspecified: Secondary | ICD-10-CM

## 2019-06-22 DIAGNOSIS — Z6841 Body Mass Index (BMI) 40.0 and over, adult: Secondary | ICD-10-CM

## 2019-06-23 ENCOUNTER — Encounter: Payer: Self-pay | Admitting: Physician Assistant

## 2019-06-23 NOTE — Progress Notes (Signed)
Chief Complaint:   OBESITY Adriana Moon is here to discuss her progress with her obesity treatment plan along with follow-up of her obesity related diagnoses. Adriana Moon is on the Category 1 Plan and states she is following her eating plan approximately 40% of the time. Adriana Moon states she is using the treadmill and weights for 60 minutes 1 time per week.  Today's visit was #: 4 Starting weight: 260 lbs Starting date: 04/28/2019 Today's weight: 243 lbs Today's date: 06/22/2019 Total lbs lost to date: 17 lbs Total lbs lost since last in-office visit: 2 lbs  Interim History: [name] provided the following food recall today: Breakfast:  No food but she drank water, Diet Dr. Malachi Bonds, and Yogi detox tea. Lunch:        Salad mix from home or [employer] (grilled chicken). Dinner:       "Hit or miss".  Subjective:   1. Insulin resistance Adriana Moon has a diagnosis of insulin resistance based on her elevated fasting insulin level >5. She continues to work on diet and exercise to decrease her risk of diabetes.  She is taking metformin 500 mg twice daily.  Lab Results  Component Value Date   INSULIN 19.4 04/28/2019   Lab Results  Component Value Date   HGBA1C 5.4 04/28/2019   2. Vitamin D deficiency Adriana Moon's Vitamin D level was 22.2 on 04/28/2019. She is currently taking vit D. She denies nausea, vomiting or muscle weakness.  3. Dyspepsia This is chronic and worsening with odynophagia.  4. Other depression, with emotional eating Adriana Moon is struggling with emotional eating and using food for comfort to the extent that it is negatively impacting her health. She has been working on behavior modification techniques to help reduce her emotional eating and has been unsuccessful. She shows no sign of suicidal or homicidal ideations.  5. At risk for diabetes mellitus Adriana Moon is at higher than average risk for developing diabetes due to her obesity.   Assessment/Plan:   1. Insulin  resistance Adriana Moon has a diagnosis of insulin resistance based on her elevated fasting insulin level >5. She continues to work on diet and exercise to decrease her risk of diabetes.  Lab Results  Component Value Date   INSULIN 19.4 04/28/2019   Lab Results  Component Value Date   HGBA1C 5.4 04/28/2019   2. Vitamin D deficiency Low Vitamin D level contributes to fatigue and are associated with obesity, breast, and colon cancer. She agrees to continue to take prescription Vitamin D @50 ,000 IU every week and will follow-up for routine testing of Vitamin D, at least 2-3 times per year to avoid over-replacement.  3. Dyspepsia Will refer to GI, as below.  Orders - Ambulatory referral to Gastroenterology  4. Other depression, with emotional eating Behavior modification techniques were discussed today to help Adriana Moon deal with her emotional/non-hunger eating behaviors.  Orders and follow up as documented in patient record.   5. At risk for diabetes mellitus Adriana Moon was given approximately 15 minutes of diabetes education and counseling today. We discussed intensive lifestyle modifications today with an emphasis on weight loss as well as increasing exercise and decreasing simple carbohydrates in her diet. We also reviewed medication options with an emphasis on risk versus benefit of those discussed.   Repetitive spaced learning was employed today to elicit superior memory formation and behavioral change.  6. Class 3 severe obesity with serious comorbidity and body mass index (BMI) of 45.0 to 49.9 in adult, unspecified obesity type (HCC) Adriana Moon is currently  in the action stage of change. As such, her goal is to continue with weight loss efforts. She has agreed to the Category 1 Plan or keeping a food journal and adhering to recommended goals of 1000 calories and 75+ protein.   Exercise goals: For substantial health benefits, adults should do at least 150 minutes (2 hours and 30 minutes) a  week of moderate-intensity, or 75 minutes (1 hour and 15 minutes) a week of vigorous-intensity aerobic physical activity, or an equivalent combination of moderate- and vigorous-intensity aerobic activity. Aerobic activity should be performed in episodes of at least 10 minutes, and preferably, it should be spread throughout the week.  Behavioral modification strategies: increasing lean protein intake and meal planning and cooking strategies.  Adriana Moon has agreed to follow-up with our clinic in 2 weeks. She was informed of the importance of frequent follow-up visits to maximize her success with intensive lifestyle modifications for her multiple health conditions.   Objective:   Blood pressure 125/83, pulse 72, temperature 98.3 F (36.8 C), temperature source Oral, height 5' (1.524 m), weight 243 lb (110.2 kg), SpO2 99 %. Body mass index is 47.46 kg/m.  General: Cooperative, alert, well developed, in no acute distress. HEENT: Conjunctivae and lids unremarkable. Cardiovascular: Regular rhythm.  Lungs: Normal work of breathing. Neurologic: No focal deficits.   Lab Results  Component Value Date   CREATININE 1.06 (H) 04/28/2019   BUN 16 04/28/2019   NA 142 04/28/2019   K 4.2 04/28/2019   CL 105 04/28/2019   CO2 26 04/28/2019   Lab Results  Component Value Date   ALT 7 04/28/2019   AST 7 04/28/2019   ALKPHOS 38 (L) 04/28/2019   BILITOT 0.5 04/28/2019   Lab Results  Component Value Date   HGBA1C 5.4 04/28/2019   HGBA1C 5.5 04/20/2016   Lab Results  Component Value Date   INSULIN 19.4 04/28/2019   Lab Results  Component Value Date   TSH 2.630 04/28/2019   Lab Results  Component Value Date   CHOL 170 04/28/2019   HDL 80 04/28/2019   LDLCALC 80 04/28/2019   TRIG 47 04/28/2019   CHOLHDL 2.3 05/08/2017   Lab Results  Component Value Date   WBC 7.5 04/28/2019   HGB 12.5 04/28/2019   HCT 37.8 04/28/2019   MCV 86 04/28/2019   PLT 338 04/28/2019   Lab Results  Component  Value Date   IRON 82 04/28/2019   TIBC 315 04/28/2019   FERRITIN 153 (H) 04/28/2019   Attestation Statements:   Reviewed by clinician on day of visit: allergies, medications, problem list, medical history, surgical history, family history, social history, and previous encounter notes.  I, Water quality scientist, CMA, am acting as Location manager for [employer], DO.  I have reviewed the above documentation for accuracy and completeness, and I agree with the above. Briscoe Deutscher, DO

## 2019-06-23 NOTE — Progress Notes (Signed)
  Office: [phone]  /  Fax: [phone]    Date: July 07, 2019   Appointment Start Time: 4:00pm Duration: 26 minutes Provider: Glennie Isle, Psy.D. Type of Session: Individual Therapy  Location of Patient: Parked in car Location of Provider: Provider's Home Type of Contact: Telepsychological Visit via [place] Telephone call  Session Content: Adriana Moon is a 39 y.o. female presenting via [place] for a follow-up appointment to address the previously established treatment goal of decreasing emotional eating. Today's appointment was a telepsychological visit due to COVID-19. Adriana Moon provided verbal consent for today's telepsychological appointment and she is aware she is responsible for securing confidentiality on her end of the session. Prior to proceeding with today's appointment, Adriana Moon's physical location at the time of this appointment was obtained as well a phone number she could be reached at in the event of technical difficulties. Adriana Moon and this provider participated in today's telepsychological service. Of note, today's appointment proceeded via telephone call at 4:03pm due to connection issues on Adriana Moon's end.  This provider conducted a brief check-in. Adriana Moon shared about recent events, including spending time with her son. Regarding eating, Adriana Moon stated, "I'm doing okay," but discussed challenges with protein intake.This was explored further and she acknowledged skipping meals. She stated she often does not think about eating. As such, psychoeducation regarding the hunger and satisfaction scale was provided. Adriana Moon provided verbal consent during today's appointment for this provider to send a handout with the scale via e-mail. She was also engaged in problem solving (e.g., setting alarms, using MyFitnessPal notifications) to assist with eating regularly. Adriana Moon was receptive to today's appointment as evidenced by openness to sharing, responsiveness to feedback,  and willingness to implement discussed strategies .  Mental Status Examination:  Appearance: well groomed and appropriate hygiene  Behavior: appropriate to circumstances Mood: euthymic Affect: mood congruent Speech: normal in rate, volume, and tone Eye Contact: appropriate Psychomotor Activity: appropriate Gait: unable to assess Thought Process: linear, logical, and goal directed  Thought Content/Perception: no hallucinations, delusions, bizarre thinking or behavior reported or observed and no evidence of suicidal and homicidal ideation, plan, and intent Orientation: time, person, place and purpose of appointment Memory/Concentration: memory, attention, language, and fund of knowledge intact  Insight/Judgment: good  Interventions:  Conducted a brief chart review Provided empathic reflections and validation Employed supportive psychotherapy interventions to facilitate reduced distress and to improve coping skills with identified stressors Employed motivational interviewing skills to assess patient's willingness/desire to adhere to recommended medical treatments and assignments Engaged patient in problem solving  Psychoeducation provided regarding hunger and satisfaction scale  DSM-5 Diagnosis(es): 311 (F32.8) Other Specified Depressive Disorder, Emotional Eating Behaviors and 300.00 (F41.9) Unspecified Anxiety Disorder  Treatment Goal & Progress: During the initial appointment with this provider, the following treatment goal was established: decrease emotional eating. Adriana Moon has demonstrated progress in her goal as evidenced by increased awareness of hunger patterns and increased awareness of triggers for emotional eating. Adriana Moon also continues to demonstrate willingness to engage in learned skill(s).  Plan: The next appointment will be scheduled in two weeks, which will be via [employer]. The next session will focus on working towards the established treatment goal.

## 2019-07-03 ENCOUNTER — Ambulatory Visit: Payer: BC Managed Care – PPO | Admitting: Physician Assistant

## 2019-07-03 ENCOUNTER — Encounter: Payer: Self-pay | Admitting: Gastroenterology

## 2019-07-07 ENCOUNTER — Other Ambulatory Visit: Payer: Self-pay

## 2019-07-07 ENCOUNTER — Ambulatory Visit (INDEPENDENT_AMBULATORY_CARE_PROVIDER_SITE_OTHER): Payer: BC Managed Care – PPO | Admitting: Psychology

## 2019-07-07 DIAGNOSIS — F419 Anxiety disorder, unspecified: Secondary | ICD-10-CM | POA: Diagnosis not present

## 2019-07-07 DIAGNOSIS — F3289 Other specified depressive episodes: Secondary | ICD-10-CM | POA: Diagnosis not present

## 2019-07-07 NOTE — Progress Notes (Unsigned)
Office: [phone]  /  Fax: [phone]    Date: July 21, 2019   Appointment Start Time: *** Duration: *** minutes Provider: Glennie Isle, Psy.D. Type of Session: Individual Therapy  Location of Patient: {gbptloc:23249} Location of Provider: {Location of Service:22491} Type of Contact: Telepsychological Visit via {gbtelepsych:23399}  Session Content:This provider called Adriana Moon at 4:33pm as she did not present for the WebEx appointment. *** The e-mail with the secure link was re-sent. As such, today's appointment was initiated *** minutes late.  Jhavia is a 39 y.o. female presenting via {gbtelepsych:23399} for a follow-up appointment to address the previously established treatment goal of decreasing emotional eating. Today's appointment was a telepsychological visit due to COVID-19. Adriana Moon provided verbal consent for today's telepsychological appointment and she is aware she is responsible for securing confidentiality on her end of the session. Prior to proceeding with today's appointment, Melita's physical location at the time of this appointment was obtained as well a phone number she could be reached at in the event of technical difficulties. Adriana Moon and this provider participated in today's telepsychological service.   This provider conducted a brief check-in and verbally administered the PHQ-9 and GAD-7. *** Narine was receptive to today's appointment as evidenced by openness to sharing, responsiveness to feedback, and {gbreceptiveness:23401}.  Mental Status Examination:  Appearance: {Appearance:22431} Behavior: {Behavior:22445} Mood: {gbmood:21757} Affect: {Affect:22436} Speech: {Speech:22432} Eye Contact: {Eye Contact:22433} Psychomotor Activity: {Motor Activity:22434} Gait: {gbgait:23404} Thought Process: {thought process:22448}  Thought Content/Perception: {disturbances:22451} Orientation: {Orientation:22437} Memory/Concentration:  {gbcognition:22449} Insight/Judgment: {Insight:22446}  Structured Assessments Results: The Patient Health Questionnaire-9 (PHQ-9) is a self-report measure that assesses symptoms and severity of depression over the course of the last two weeks. Kamile obtained a score of *** suggesting {GBPHQ9SEVERITY:21752}. Faya finds the endorsed symptoms to be {gbphq9difficulty:21754}. [0= Not at all; 1= Several days; 2= More than half the days; 3= Nearly every day] Little interest or pleasure in doing things ***  Feeling down, depressed, or hopeless ***  Trouble falling or staying asleep, or sleeping too much ***  Feeling tired or having little energy ***  Poor appetite or overeating ***  Feeling bad about yourself --- or that you are a failure or have let yourself or your family down ***  Trouble concentrating on things, such as reading the newspaper or watching television ***  Moving or speaking so slowly that other people could have noticed? Or the opposite --- being so fidgety or restless that you have been moving around a lot more than usual ***  Thoughts that you would be better off dead or hurting yourself in some way ***  PHQ-9 Score ***    The Generalized Anxiety Disorder-7 (GAD-7) is a brief self-report measure that assesses symptoms of anxiety over the course of the last two weeks. Yulitza obtained a score of *** suggesting {gbgad7severity:21753}. Isamary finds the endorsed symptoms to be {gbphq9difficulty:21754}. [0= Not at all; 1= Several days; 2= Over half the days; 3= Nearly every day] Feeling nervous, anxious, on edge ***  Not being able to stop or control worrying ***  Worrying too much about different things ***  Trouble relaxing ***  Being so restless that it's hard to sit still ***  Becoming easily annoyed or irritable ***  Feeling afraid as if something awful might happen ***  GAD-7 Score ***   Interventions:  {Interventions for Progress Notes:23405}  DSM-5  Diagnosis(es): 311 (F32.8) Other Specified Depressive Disorder, Emotional Eating Behaviors and 300.00 (F41.9) Unspecified Anxiety Disorder  Treatment Goal & Progress: During the initial  appointment with this provider, the following treatment goal was established: decrease emotional eating. Tayley has demonstrated progress in her goal as evidenced by {gbtxprogress:22839}. Charnee also {gbtxprogress2:22951}.  Plan: The next appointment will be scheduled in {gbweeks:21758}, which will be {gbtxmodality:23402}. The next session will focus on {Plan for Next Appointment:23400}.

## 2019-07-08 ENCOUNTER — Other Ambulatory Visit: Payer: Self-pay

## 2019-07-08 ENCOUNTER — Ambulatory Visit (INDEPENDENT_AMBULATORY_CARE_PROVIDER_SITE_OTHER): Payer: BC Managed Care – PPO | Admitting: Family Medicine

## 2019-07-08 ENCOUNTER — Encounter (INDEPENDENT_AMBULATORY_CARE_PROVIDER_SITE_OTHER): Payer: Self-pay | Admitting: Family Medicine

## 2019-07-08 VITALS — BP 127/86 | HR 80 | Temp 98.7°F | Ht 60.0 in | Wt 240.0 lb

## 2019-07-08 DIAGNOSIS — F3289 Other specified depressive episodes: Secondary | ICD-10-CM | POA: Diagnosis not present

## 2019-07-08 DIAGNOSIS — E559 Vitamin D deficiency, unspecified: Secondary | ICD-10-CM | POA: Diagnosis not present

## 2019-07-08 DIAGNOSIS — E88819 Insulin resistance, unspecified: Secondary | ICD-10-CM

## 2019-07-08 DIAGNOSIS — E66813 Obesity, class 3: Secondary | ICD-10-CM

## 2019-07-08 DIAGNOSIS — Z6841 Body Mass Index (BMI) 40.0 and over, adult: Secondary | ICD-10-CM

## 2019-07-08 DIAGNOSIS — E8881 Metabolic syndrome: Secondary | ICD-10-CM

## 2019-07-09 MED ORDER — METFORMIN HCL 500 MG PO TABS: 500.0000 mg | ORAL_TABLET | Freq: Two times a day (BID) | ORAL | 0 refills | Status: DC

## 2019-07-09 MED ORDER — VITAMIN D (ERGOCALCIFEROL) 1.25 MG (50000 UNIT) PO CAPS: 50000.0000 [IU] | ORAL_CAPSULE | ORAL | 0 refills | Status: DC

## 2019-07-09 NOTE — Progress Notes (Signed)
Chief Complaint:   OBESITY Adriana Moon is here to discuss her progress with her obesity treatment plan along with follow-up of her obesity related diagnoses. [name] is on the Category 1 Plan and states she is following her eating plan approximately 50% of the time. [name] states she is exercising for 0 minutes 0 times per week.  Today's visit was #: 5 Starting weight: 260 lbs Starting date: 04/28/2019 Today's weight: 240 lbs Today's date: 07/08/2019 Total lbs lost to date: 20 lbs Total lbs lost since last in-office visit: 3 lbs  Subjective:   1. Vitamin D deficiency [name]'s Vitamin D level was 22.2 on 04/28/2019. She is currently taking vit D. She denies nausea, vomiting or muscle weakness.  2. Insulin resistance [name] has a diagnosis of insulin resistance based on her elevated fasting insulin level >5. She continues to work on diet and exercise to decrease her risk of diabetes.  She is taking metformin 500 mg twice daily.  Lab Results  Component Value Date   INSULIN 19.4 04/28/2019   Lab Results  Component Value Date   HGBA1C 5.4 04/28/2019   3. Other depression, with emotional eating [name] is struggling with emotional eating and using food for comfort to the extent that it is negatively impacting her health. She has been working on behavior modification techniques to help reduce her emotional eating and has been unsuccessful. She shows no sign of suicidal or homicidal ideations.  Assessment/Plan:   1. Vitamin D deficiency Low Vitamin D level contributes to fatigue and are associated with obesity, breast, and colon cancer. She agrees to continue to take prescription Vitamin D @50 ,000 IU every week and will follow-up for routine testing of Vitamin D, at least 2-3 times per year to avoid over-replacement.  Orders - Vitamin D, Ergocalciferol, (DRISDOL) 1.25 MG (50000 UNIT) CAPS capsule; Take 1 capsule (50,000 Units total) by mouth every 7 (seven) days.  Dispense: 4  capsule; Refill: 0  2. Insulin resistance Limor will continue to work on weight loss, exercise, and decreasing simple carbohydrates to help decrease the risk of diabetes. Metzi agreed to follow-up with [name] as directed to closely monitor her progress.  Orders - metFORMIN (GLUCOPHAGE) 500 MG tablet; Take 1 tablet (500 mg total) by mouth 2 (two) times daily.  Dispense: 60 tablet; Refill: 0  3. Other depression, with emotional eating Behavior modification techniques were discussed today to help [name] deal with her emotional/non-hunger eating behaviors.  Orders and follow up as documented in patient record.   4. Class 3 severe obesity with serious comorbidity and body mass index (BMI) of 45.0 to 49.9 in adult, unspecified obesity type (HCC) [name] is currently in the action stage of change. As such, her goal is to continue with weight loss efforts. She has agreed to the Category 1 Plan.   Exercise goals: All adults should avoid inactivity. Some physical activity is better than none, and adults who participate in any amount of physical activity gain some health benefits.  Behavioral modification strategies: increasing lean protein intake and increasing water intake.  [name] has agreed to follow-up with our clinic in 2 weeks. She was informed of the importance of frequent follow-up visits to maximize her success with intensive lifestyle modifications for her multiple health conditions.   Objective:   Blood pressure 127/86, pulse 80, temperature 98.7 F (37.1 C), temperature source Oral, height 5' (1.524 m), weight 240 lb (108.9 kg), SpO2 100 %. Body mass index is 46.87 kg/m.  General: Cooperative, alert,  well developed, in no acute distress. HEENT: Conjunctivae and lids unremarkable. Cardiovascular: Regular rhythm.  Lungs: Normal work of breathing. Neurologic: No focal deficits.   Lab Results  Component Value Date   CREATININE 1.06 (H) 04/28/2019   BUN 16 04/28/2019   NA 142  04/28/2019   K 4.2 04/28/2019   CL 105 04/28/2019   CO2 26 04/28/2019   Lab Results  Component Value Date   ALT 7 04/28/2019   AST 7 04/28/2019   ALKPHOS 38 (L) 04/28/2019   BILITOT 0.5 04/28/2019   Lab Results  Component Value Date   HGBA1C 5.4 04/28/2019   HGBA1C 5.5 04/20/2016   Lab Results  Component Value Date   INSULIN 19.4 04/28/2019   Lab Results  Component Value Date   TSH 2.630 04/28/2019   Lab Results  Component Value Date   CHOL 170 04/28/2019   HDL 80 04/28/2019   LDLCALC 80 04/28/2019   TRIG 47 04/28/2019   CHOLHDL 2.3 05/08/2017   Lab Results  Component Value Date   WBC 7.5 04/28/2019   HGB 12.5 04/28/2019   HCT 37.8 04/28/2019   MCV 86 04/28/2019   PLT 338 04/28/2019   Lab Results  Component Value Date   IRON 82 04/28/2019   TIBC 315 04/28/2019   FERRITIN 153 (H) 04/28/2019   Attestation Statements:   Reviewed by clinician on day of visit: allergies, medications, problem list, medical history, surgical history, family history, social history, and previous encounter notes.  I, Water quality scientist, CMA, am acting as Location manager for [employer], DO.  I have reviewed the above documentation for accuracy and completeness, and I agree with the above. Briscoe Deutscher, DO

## 2019-07-10 ENCOUNTER — Encounter: Payer: Self-pay | Admitting: Adult Health

## 2019-07-10 ENCOUNTER — Ambulatory Visit: Payer: BC Managed Care – PPO | Admitting: Adult Health

## 2019-07-10 ENCOUNTER — Other Ambulatory Visit: Payer: Self-pay

## 2019-07-10 VITALS — BP 129/87 | HR 75 | Ht 60.0 in | Wt 242.2 lb

## 2019-07-10 DIAGNOSIS — N898 Other specified noninflammatory disorders of vagina: Secondary | ICD-10-CM | POA: Diagnosis not present

## 2019-07-10 LAB — POCT WET PREP (WET MOUNT)
Clue Cells Wet Prep Whiff POC: NEGATIVE
WBC, Wet Prep HPF POC: POSITIVE

## 2019-07-10 NOTE — Progress Notes (Signed)
  Subjective:     Patient ID: Adriana Moon, female   DOB: 10-Dec-1980, 39 y.o.   MRN: [id number]  HPI Adriana Moon is a 39 year old black female, single, [id number], in complaining of vaginal discharge that is worse after using rephresh, has no itching or odor. She is on tamoxifen and feels drier. PCP is Darla Lesches NP>  Review of Systems Has vaginal discharge, worse after using rephresh,it is white and pasty  Feels dry in vagina  Denies any odor, or itching or burning    Reviewed past medical,surgical, social and family history. Reviewed medications and allergies.  Objective:   Physical Exam BP 129/87 (BP Location: Left Arm, Patient Position: Sitting, Cuff Size: Normal)   Pulse 75   Ht 5' (1.524 m)   Wt 242 lb 3.2 oz (109.9 kg)   BMI 47.30 kg/m fall risk is low PHQ 2 score is 0. Skin warm and dry.Pelvic: external genitalia is normal in appearance no lesions, vagina: white discharge with odor,urethra has no lesions or masses noted, cervix:smooth and bulbous, uterus: normal size, shape and contour, non tender, no masses felt, adnexa: no masses or tenderness noted. Bladder is non tender and no masses felt. Wet prep: +WBCs Nuswab obtained Examination chaperoned by Rolena Infante LPN    Assessment:     1. Vaginal discharge Nuswab sent     Plan:     Nuswab sent  Try luvena for vaginal moisture

## 2019-07-13 LAB — NUSWAB VAGINITIS PLUS (VG+)
Candida albicans, NAA: NEGATIVE
Candida glabrata, NAA: NEGATIVE
Chlamydia trachomatis, NAA: NEGATIVE
Neisseria gonorrhoeae, NAA: NEGATIVE
Trich vag by NAA: NEGATIVE

## 2019-07-15 DIAGNOSIS — M722 Plantar fascial fibromatosis: Secondary | ICD-10-CM | POA: Diagnosis not present

## 2019-07-21 ENCOUNTER — Telehealth (INDEPENDENT_AMBULATORY_CARE_PROVIDER_SITE_OTHER): Payer: Self-pay | Admitting: Psychology

## 2019-07-21 ENCOUNTER — Ambulatory Visit (INDEPENDENT_AMBULATORY_CARE_PROVIDER_SITE_OTHER): Payer: BC Managed Care – PPO | Admitting: Psychology

## 2019-07-21 NOTE — Telephone Encounter (Signed)
  Office: [phone]  /  Fax: [phone]  Date of Call: July 21, 2019  Time of Call: 4:33pm Duration of Call: ~3 minutes Provider: Glennie Isle, PsyD  CONTENT: This provider called [name] to check-in as she did not present for today's Webex appointment at 4:30pm. [name] shared she forgot about today's appointment and requested to reschedule. A brief check-in was conducted and she shared things are going "well." She acknowledged understanding her one time no show fee waiver will be applied for today's visit. No evidence of suicidal and homicidal ideation, plan, or intent  PLAN: Kasidi is scheduled for an appointment on August 05, 2019 at 4:00pm via Webex.

## 2019-07-22 NOTE — Progress Notes (Unsigned)
Office: [phone]  /  Fax: [phone]    Date: August 05, 2019   Appointment Start Time: *** Duration: *** minutes  Provider: Glennie Isle, Psy.D. Type of Session: Individual Therapy  Location of Patient: {gbptloc:23249} Location of Provider: Provider's Home Type of Contact: Telepsychological Visit via {gbtelepsych:23399}  Session Content: This provider called Adriana Moon at 4:03pm as she did not present for the telepsychological appointment. *** As such, today's appointment was initiated *** minutes late.  [name] is a 39 y.o. female presenting via {gbtelepsych:23399} for a follow-up appointment to address the previously established treatment goal of decreasing emotional eating. Today's appointment was a telepsychological visit due to COVID-19. Adriana Moon provided verbal consent for today's telepsychological appointment and she is aware she is responsible for securing confidentiality on her end of the session. Prior to proceeding with today's appointment, Quantavia's physical location at the time of this appointment was obtained as well a phone number she could be reached at in the event of technical difficulties. Adriana Moon and this provider participated in today's telepsychological service.   This provider conducted a brief check-in and verbally administered the PHQ-9 and GAD-7. ***. D Sinda was receptive to today's appointment as evidenced by openness to sharing, responsiveness to feedback, and {gbreceptiveness:23401}.  Mental Status Examination:  Appearance: {Appearance:22431} Behavior: {Behavior:22445} Mood: {gbmood:21757} Affect: {Affect:22436} Speech: {Speech:22432} Eye Contact: {Eye Contact:22433} Psychomotor Activity: {Motor Activity:22434} Gait: {gbgait:23404} Thought Process: {thought process:22448}  Thought Content/Perception: {disturbances:22451} Orientation: {Orientation:22437} Memory/Concentration: {gbcognition:22449} Insight/Judgment: {Insight:22446}  Structured  Assessments Results: The Patient Health Questionnaire-9 (PHQ-9) is a self-report measure that assesses symptoms and severity of depression over the course of the last two weeks. [name] obtained a score of *** suggesting {GBPHQ9SEVERITY:21752}. [name] finds the endorsed symptoms to be {gbphq9difficulty:21754}. [0= Not at all; 1= Several days; 2= More than half the days; 3= Nearly every day] Little interest or pleasure in doing things ***  Feeling down, depressed, or hopeless ***  Trouble falling or staying asleep, or sleeping too much ***  Feeling tired or having little energy ***  Poor appetite or overeating ***  Feeling bad about yourself --- or that you are a failure or have let yourself or your family down ***  Trouble concentrating on things, such as reading the newspaper or watching television ***  Moving or speaking so slowly that other people could have noticed? Or the opposite --- being so fidgety or restless that you have been moving around a lot more than usual ***  Thoughts that you would be better off dead or hurting yourself in some way ***  PHQ-9 Score ***    The Generalized Anxiety Disorder-7 (GAD-7) is a brief self-report measure that assesses symptoms of anxiety over the course of the last two weeks. [name] obtained a score of *** suggesting {gbgad7severity:21753}. [name] finds the endorsed symptoms to be {gbphq9difficulty:21754}. [0= Not at all; 1= Several days; 2= Over half the days; 3= Nearly every day] Feeling nervous, anxious, on edge ***  Not being able to stop or control worrying ***  Worrying too much about different things ***  Trouble relaxing ***  Being so restless that it's hard to sit still ***  Becoming easily annoyed or irritable ***  Feeling afraid as if something awful might happen ***  GAD-7 Score ***   Interventions:  {Interventions for Progress Notes:23405}  DSM-5 Diagnosis(es): 311 (F32.8) Other Specified Depressive Disorder, Emotional Eating  Behaviors and 300.00 (F41.9) Unspecified Anxiety Disorder  Treatment Goal & Progress: During the initial appointment with this provider, the following  treatment goal was established: decrease emotional eating. [name] has demonstrated progress in her goal as evidenced by {gbtxprogress:22839}. [name] also {gbtxprogress2:22951}.  Plan: The next appointment will be scheduled in {gbweeks:21758}, which will be {gbtxmodality:23402}. The next session will focus on {Plan for Next Appointment:23400}.

## 2019-07-23 ENCOUNTER — Ambulatory Visit (INDEPENDENT_AMBULATORY_CARE_PROVIDER_SITE_OTHER): Payer: BC Managed Care – PPO | Admitting: Family Medicine

## 2019-07-23 ENCOUNTER — Other Ambulatory Visit: Payer: Self-pay

## 2019-07-23 ENCOUNTER — Encounter (INDEPENDENT_AMBULATORY_CARE_PROVIDER_SITE_OTHER): Payer: Self-pay | Admitting: Family Medicine

## 2019-07-23 VITALS — BP 110/75 | HR 84 | Temp 98.4°F | Ht 60.0 in | Wt 241.0 lb

## 2019-07-23 DIAGNOSIS — E8881 Metabolic syndrome: Secondary | ICD-10-CM | POA: Diagnosis not present

## 2019-07-23 DIAGNOSIS — Z9189 Other specified personal risk factors, not elsewhere classified: Secondary | ICD-10-CM

## 2019-07-23 DIAGNOSIS — E88819 Insulin resistance, unspecified: Secondary | ICD-10-CM

## 2019-07-23 DIAGNOSIS — E559 Vitamin D deficiency, unspecified: Secondary | ICD-10-CM | POA: Diagnosis not present

## 2019-07-23 DIAGNOSIS — E66813 Obesity, class 3: Secondary | ICD-10-CM

## 2019-07-23 DIAGNOSIS — Z6841 Body Mass Index (BMI) 40.0 and over, adult: Secondary | ICD-10-CM

## 2019-07-23 MED ORDER — VITAMIN D (ERGOCALCIFEROL) 1.25 MG (50000 UNIT) PO CAPS: 50000.0000 [IU] | ORAL_CAPSULE | ORAL | 0 refills | Status: DC

## 2019-07-26 ENCOUNTER — Other Ambulatory Visit: Payer: Self-pay | Admitting: Family Medicine

## 2019-07-26 DIAGNOSIS — J4520 Mild intermittent asthma, uncomplicated: Secondary | ICD-10-CM

## 2019-07-27 NOTE — Progress Notes (Signed)
Chief Complaint:   OBESITY Adriana Moon is here to discuss her progress with her obesity treatment plan along with follow-up of her obesity related diagnoses. Adriana Moon is on the Category 1 Plan and states she is following her eating plan approximately 20% of the time. Adriana Moon states she is exercising 0 minutes 0 times per week.  Today's visit was #: 7 Starting weight: 260 lbs Starting date: 04/28/2019 Today's weight: 241 lbs Today's date: 07/23/2019 Total lbs lost to date: 19 Total lbs lost since last in-office visit: 0  Interim History: Adriana Moon has had a very busy 2 weeks and felt less motivated to follow the plan. This a.m. she ate a whole box of gummy bears. She feels she can get back on track, just felt she needed _____. She is often doing a protein drink with 2 oranges, apple, and then yogurt for breakfast; microwave meal for lunch; and often skipping dinner.  Subjective:   Vitamin D deficiency. No nausea, vomiting, or muscle weakness. She endorses fatigue. Adriana Moon is taking prescription Vitamin D. Last Vitamin D 22.2 on 04/28/2019.  Insulin resistance. Adriana Moon has a diagnosis of insulin resistance based on her elevated fasting insulin level >5. She continues to work on diet and exercise to decrease her risk of diabetes. She is on metformin with no GI side effects.  Lab Results  Component Value Date   INSULIN 19.4 04/28/2019   Lab Results  Component Value Date   HGBA1C 5.4 04/28/2019   At risk for diabetes mellitus. Adriana Moon is at higher than average risk for developing diabetes due to her obesity.   Assessment/Plan:   Vitamin D deficiency. Low Vitamin D level contributes to fatigue and are associated with obesity, breast, and colon cancer. She was given a refill on her Vitamin D, Ergocalciferol, (DRISDOL) 1.25 MG (50000 UNIT) CAPS capsule every week #4 with 0 refills and will follow-up for routine testing of Vitamin D, at least 2-3 times per year to  avoid over-replacement.    Insulin resistance. Adriana Moon will continue to work on weight loss, exercise, and decreasing simple carbohydrates to help decrease the risk of diabetes. Adriana Moon agreed to follow-up with [name] as directed to closely monitor her progress. Will repeat labs next month.  At risk for diabetes mellitus. Adriana Moon was given approximately 15 minutes of diabetes education and counseling today. We discussed intensive lifestyle modifications today with an emphasis on weight loss as well as increasing exercise and decreasing simple carbohydrates in her diet. We also reviewed medication options with an emphasis on risk versus benefit of those discussed.   Repetitive spaced learning was employed today to elicit superior memory formation and behavioral change.  Class 3 severe obesity with serious comorbidity and body mass index (BMI) of 45.0 to 49.9 in adult, unspecified obesity type ([place]).  Adriana Moon is currently in the action stage of change. As such, her goal is to continue with weight loss efforts. She has agreed to keeping a food journal and adhering to recommended goals of 1000 calores calories and 80+ grams of protein daily. She will aim for something to eat 4 times a week in the evening.   Exercise goals: For substantial health benefits, adults should do at least 150 minutes (2 hours and 30 minutes) a week of moderate-intensity, or 75 minutes (1 hour and 15 minutes) a week of vigorous-intensity aerobic physical activity, or an equivalent combination of moderate- and vigorous-intensity aerobic activity. Aerobic activity should be performed in episodes of at least 10 minutes,  and preferably, it should be spread throughout the week.  Behavioral modification strategies: increasing lean protein intake, meal planning and cooking strategies, keeping healthy foods in the home and planning for success.  Adriana Moon has agreed to follow-up with our clinic in 2-3 weeks. She was informed of the  importance of frequent follow-up visits to maximize her success with intensive lifestyle modifications for her multiple health conditions.   Objective:   Blood pressure 110/75, pulse 84, temperature 98.4 F (36.9 C), temperature source Oral, height 5' (1.524 m), weight 241 lb (109.3 kg), SpO2 100 %. Body mass index is 47.07 kg/m.  General: Cooperative, alert, well developed, in no acute distress. HEENT: Conjunctivae and lids unremarkable. Cardiovascular: Regular rhythm.  Lungs: Normal work of breathing. Neurologic: No focal deficits.   Lab Results  Component Value Date   CREATININE 1.06 (H) 04/28/2019   BUN 16 04/28/2019   NA 142 04/28/2019   K 4.2 04/28/2019   CL 105 04/28/2019   CO2 26 04/28/2019   Lab Results  Component Value Date   ALT 7 04/28/2019   AST 7 04/28/2019   ALKPHOS 38 (L) 04/28/2019   BILITOT 0.5 04/28/2019   Lab Results  Component Value Date   HGBA1C 5.4 04/28/2019   HGBA1C 5.5 04/20/2016   Lab Results  Component Value Date   INSULIN 19.4 04/28/2019   Lab Results  Component Value Date   TSH 2.630 04/28/2019   Lab Results  Component Value Date   CHOL 170 04/28/2019   HDL 80 04/28/2019   LDLCALC 80 04/28/2019   TRIG 47 04/28/2019   CHOLHDL 2.3 05/08/2017   Lab Results  Component Value Date   WBC 7.5 04/28/2019   HGB 12.5 04/28/2019   HCT 37.8 04/28/2019   MCV 86 04/28/2019   PLT 338 04/28/2019   Lab Results  Component Value Date   IRON 82 04/28/2019   TIBC 315 04/28/2019   FERRITIN 153 (H) 04/28/2019   Attestation Statements:   Reviewed by clinician on day of visit: allergies, medications, problem list, medical history, surgical history, family history, social history, and previous encounter notes.  I, Michaelene Song, am acting as transcriptionist for Coralie Common, MD   I have reviewed the above documentation for accuracy and completeness, and I agree with the above. - Ilene Qua, MD

## 2019-08-05 ENCOUNTER — Ambulatory Visit (INDEPENDENT_AMBULATORY_CARE_PROVIDER_SITE_OTHER): Payer: BC Managed Care – PPO | Admitting: Psychology

## 2019-08-05 ENCOUNTER — Other Ambulatory Visit: Payer: Self-pay

## 2019-08-05 ENCOUNTER — Telehealth (INDEPENDENT_AMBULATORY_CARE_PROVIDER_SITE_OTHER): Payer: Self-pay | Admitting: Psychology

## 2019-08-05 NOTE — Telephone Encounter (Signed)
  Office: [phone]  /  Fax: [phone]  Date of Call: August 05, 2019  Time of Call: 4:03pm Duration of Call: 4 minutes Provider: Glennie Isle, PsyD  CONTENT:This provider called Adriana Moon to check-in as she did not present for today's Webex appointment at 4:00pm. Adriana Moon reported she was on the road to obtain a permit, noting she would have to reschedule. This provider requested she pull over in a safe location to reschedule. She agreed. [name] verbally acknowledged understanding that the no show fee will be applied for today's missed appointment.  No evidence of suicidal and homicidal ideation, plan, or intent. She denied any other problems/concerns at this time.  PLAN: Adriana Moon is scheduled for an appointment on August 10, 2019 at 4:30pm via [facility] Video Visit.

## 2019-08-05 NOTE — Progress Notes (Addendum)
Office: [phone]  /  Fax: [phone]    Date: August 10, 2019   Appointment Start Time: 4:39pm Duration: 21 minutes Provider: Glennie Isle, Psy.D. Type of Session: Individual Therapy  Location of Patient: Parked in car Location of Provider: Provider's Home Type of Contact: Telepsychological Visit via MyChart Video Visit  Session Content: This provider called Adriana Moon at 4:32pm as she did not present for the telepsychological appointment. The link for the MyChart Video Visit was sent to Adriana Moon via text. This provider called her again at 4:35pm as she did not join. She indicated it was not loading. She was encouraged to login to her MyChart account; however, she noted she was having difficulty. Thus, the link was sent via e-mail. As such, today's appointment was initiated 9 minutes late. Adriana Moon is a 39 y.o. female presenting via [place] Visit for a follow-up appointment to address the previously established treatment goal of decreasing emotional eating. Today's appointment was a telepsychological visit due to COVID-19. Adriana Moon provided verbal consent for today's telepsychological appointment and she is aware she is responsible for securing confidentiality on her end of the session. Prior to proceeding with today's appointment, Adriana Moon's physical location at the time of this appointment was obtained as well a phone number she could be reached at in the event of technical difficulties. Adriana Moon and this provider participated in today's telepsychological service.   Adriana Moon acknowledged understanding that for today's appointment and future telepsychological appointments, MyChart will be utilized. She also verbally acknowledged understanding that the information outlined in the telepsychological informed consent form signed at the onset of treatment would still be applicable despite the change in the videoconferencing platform.   This provider conducted a brief check-in. Adriana Moon  shared about a recent vacation. She noted she is currently struggling with physical pain, noting she was given cortisone shots resulting in an increase appetite. This provider explored what she is eating in response to the increased appetite. It was reflected she is not consuming enough protein. As such, she was engaged in problem solving to increase protein intake. Additionally, Adriana Moon was receptive to developing a working plan for her meals/snacks for the day. She was also receptive to cooking with her son who enjoys cooking to increase likelihood of consuming dinner regularly. Other meal prep strategies were also discussed. Overall, Adriana Moon was receptive to today's appointment as evidenced by openness to sharing, responsiveness to feedback, and willingness to implement discussed strategies .  Mental Status Examination:  Appearance: well groomed and appropriate hygiene  Behavior: appropriate to circumstances Mood: euthymic Affect: mood congruent Speech: normal in rate, volume, and tone Eye Contact: appropriate Psychomotor Activity: appropriate Gait: unable to assess Thought Process: linear, logical, and goal directed  Thought Content/Perception: no hallucinations, delusions, bizarre thinking or behavior reported or observed and no evidence of suicidal and homicidal ideation, plan, and intent Orientation: time, person, place and purpose of appointment Memory/Concentration: memory, attention, language, and fund of knowledge intact  Insight/Judgment: good  Interventions:  Conducted a brief chart review Provided empathic reflections and validation Employed supportive psychotherapy interventions to facilitate reduced distress and to improve coping skills with identified stressors Employed motivational interviewing skills to assess patient's willingness/desire to adhere to recommended medical treatments and assignments Engaged patient in problem solving  DSM-5 Diagnosis(es): 311 (F32.8) Other  Specified Depressive Disorder, Emotional Eating Behaviors and 300.00 (F41.9) Unspecified Anxiety Disorder  Treatment Goal & Progress: During the initial appointment with this provider, the following treatment goal was established: decrease emotional eating. Adriana Moon has demonstrated progress  in her goal as evidenced by increased awareness of hunger patterns and increased awareness of triggers for emotional eating. Adriana Moon also continues to demonstrate willingness to engage in learned skill(s).  Plan: Due to [facility] requiring the latest available appointment, the next appointment will be scheduled in three weeks, which will be via MyChart Video Visit. The next session will focus on working towards the established treatment goal.

## 2019-08-06 ENCOUNTER — Other Ambulatory Visit: Payer: Self-pay | Admitting: Adult Health

## 2019-08-10 ENCOUNTER — Encounter: Payer: Self-pay | Admitting: Podiatry

## 2019-08-10 ENCOUNTER — Ambulatory Visit (INDEPENDENT_AMBULATORY_CARE_PROVIDER_SITE_OTHER): Payer: BC Managed Care – PPO

## 2019-08-10 ENCOUNTER — Telehealth (INDEPENDENT_AMBULATORY_CARE_PROVIDER_SITE_OTHER): Payer: BC Managed Care – PPO | Admitting: Psychology

## 2019-08-10 ENCOUNTER — Other Ambulatory Visit: Payer: Self-pay

## 2019-08-10 ENCOUNTER — Ambulatory Visit: Payer: BC Managed Care – PPO | Admitting: Podiatry

## 2019-08-10 VITALS — Temp 98.2°F

## 2019-08-10 DIAGNOSIS — F419 Anxiety disorder, unspecified: Secondary | ICD-10-CM | POA: Diagnosis not present

## 2019-08-10 DIAGNOSIS — M722 Plantar fascial fibromatosis: Secondary | ICD-10-CM

## 2019-08-10 DIAGNOSIS — F3289 Other specified depressive episodes: Secondary | ICD-10-CM

## 2019-08-10 NOTE — Patient Instructions (Addendum)
Pre-Operative Instructions  Congratulations, you have decided to take an important step towards improving your quality of life.  You can be assured that the doctors and staff at [facility] will be with you every step of the way.  Here are some important things you should know:  1. Plan to be at the surgery center/hospital at least 1 (one) hour prior to your scheduled time, unless otherwise directed by the surgical center/hospital staff.  You must have a responsible adult accompany you, remain during the surgery and drive you home.  Make sure you have directions to the surgical center/hospital to ensure you arrive on time. 2. If you are having surgery at [facility] or [facility], you will need a copy of your medical history and physical form from your family physician within one month prior to the date of surgery. We will give you a form for your primary physician to complete.  3. We make every effort to accommodate the date you request for surgery.  However, there are times where surgery dates or times have to be moved.  We will contact you as soon as possible if a change in schedule is required.   4. No aspirin/ibuprofen for one week before surgery.  If you are on aspirin, any non-steroidal anti-inflammatory medications (Mobic, Aleve, Ibuprofen) should not be taken seven (7) days prior to your surgery.  You make take Tylenol for pain prior to surgery.  5. Medications - If you are taking daily heart and blood pressure medications, seizure, reflux, allergy, asthma, anxiety, pain or diabetes medications, make sure you notify the surgery center/hospital before the day of surgery so they can tell you which medications you should take or avoid the day of surgery. 6. No food or drink after midnight the night before surgery unless directed otherwise by surgical center/hospital staff. 7. No alcoholic beverages 24-hours prior to surgery.  No smoking 24-hours prior or 24-hours after  surgery. 8. Wear loose pants or shorts. They should be loose enough to fit over bandages, boots, and casts. 9. Don't wear slip-on shoes. Sneakers are preferred. 10. Bring your boot with you to the surgery center/hospital.  Also bring crutches or a walker if your physician has prescribed it for you.  If you do not have this equipment, it will be provided for you after surgery. 11. If you have not been contacted by the surgery center/hospital by the day before your surgery, call to confirm the date and time of your surgery. 12. Leave-time from work may vary depending on the type of surgery you have.  Appropriate arrangements should be made prior to surgery with your employer. 13. Prescriptions will be provided immediately following surgery by your doctor.  Fill these as soon as possible after surgery and take the medication as directed. Pain medications will not be refilled on weekends and must be approved by the doctor. 14. Remove nail polish on the operative foot and avoid getting pedicures prior to surgery. 15. Wash the night before surgery.  The night before surgery wash the foot and leg well with water and the antibacterial soap provided. Be sure to pay special attention to beneath the toenails and in between the toes.  Wash for at least three (3) minutes. Rinse thoroughly with water and dry well with a towel.  Perform this wash unless told not to do so by your physician.  Enclosed: 1 Ice pack (please put in freezer the night before surgery)   1 Hibiclens skin cleaner     Pre-op instructions  If you have any questions regarding the instructions, please do not hesitate to call our office.  [place]: [address], [place], [place] [address] -- [phone]  [place]: [address] [place], [place] [address] -- [phone]  [place]: [address], [place],  [address] -- [phone]   Website: [email or web address] 

## 2019-08-11 ENCOUNTER — Ambulatory Visit: Payer: BC Managed Care – PPO | Admitting: Gastroenterology

## 2019-08-12 NOTE — Progress Notes (Signed)
   Subjective: 39 y.o. female presenting today as a new patient with a chief complaint of intermittent aching pain to the right plantar heel that began two years ago. She states the pain has been gradually worsening over the past few months. She states the pain is worse in the morning when she first gets out of bed and after walking for long periods of time. She has gotten injections in the past that are no longer providing any relief. She has been using ice therapy, inserts, night splints and an ankle brace for treatment. Patient is here for further evaluation and treatment.   Past Medical History:  Diagnosis Date  . Angio-edema   . Anxiety    manages naturally  . Arthritis of both knees   . Asthma   . Bilateral swelling of feet   . Breast cancer ([place])   . Breast disorder    cancer  . Depression    pt. reports that she tends to get down & has had Lexapro in the past but its been taken off her list for now.   . Family history of breast cancer   . Family history of kidney cancer   . Family history of lung cancer   . Family history of prostate cancer   . GERD (gastroesophageal reflux disease)   . History of kidney problems   . IBS (irritable bowel syndrome)   . Joint pain   . Lactose intolerance   . Migraines   . PCOS (polycystic ovarian syndrome)   . Urticaria   . Varicose veins of legs      Objective: Physical Exam General: The patient is alert and oriented x3 in no acute distress.  Dermatology: Skin is warm, dry and supple bilateral lower extremities. Negative for open lesions or macerations bilateral.   Vascular: Dorsalis Pedis and Posterior Tibial pulses palpable bilateral.  Capillary fill time is immediate to all digits.  Neurological: Epicritic and protective threshold intact bilateral.   Musculoskeletal: Tenderness to palpation to the plantar aspect of the bilateral heels along the plantar fascia. All other joints range of motion within normal limits bilateral.  Strength 5/5 in all groups bilateral.   Radiographic exam: Normal osseous mineralization. Joint spaces preserved. No fracture/dislocation/boney destruction. No other soft tissue abnormalities or radiopaque foreign bodies.   Assessment: 1. Chronic plantar fasciitis bilateral feet  Plan of Care:  1. Patient evaluated. Xrays reviewed.   2. Injection of 0.5cc Celestone soluspan injected into the bilateral heels.  3. Today we discussed the conservative versus surgical management of the presenting pathology. The patient opts for surgical management. All possible complications and details of the procedure were explained. All patient questions were answered. No guarantees were expressed or implied. 4. Authorization for surgery was initiated today. Surgery will consist of EPF bilateral.  5. Continue using ankle braces and good shoe gear.  6. Return to clinic one week post op.   Works at [employer]. Can only take one week off.     Edrick Kins, DPM [facility]  Dr. Edrick Kins, DPM    [address], [place] [address]                Office [phone]  Fax [phone]

## 2019-08-13 ENCOUNTER — Ambulatory Visit (INDEPENDENT_AMBULATORY_CARE_PROVIDER_SITE_OTHER): Payer: BC Managed Care – PPO | Admitting: Family Medicine

## 2019-08-18 NOTE — Progress Notes (Signed)
Office: [phone]  /  Fax: [phone]    Date: Aug 31, 2019   Appointment Start Time: 4:31pm Duration: 18 minutes Provider: Glennie Isle, Psy.D. Type of Session: Individual Therapy  Location of Patient: Home Location of Provider: Provider's Home Type of Contact: Telepsychological Visit via MyChart Video Visit  Session Content: Adriana Moon is a 39 y.o. female presenting via [place] Visit for a follow-up appointment to address the previously established treatment goal of decreasing emotional eating. Today's appointment was a telepsychological visit due to COVID-19. Adriana Moon provided verbal consent for today's telepsychological appointment and she is aware she is responsible for securing confidentiality on her end of the session. Prior to proceeding with today's appointment, Adriana Moon's physical location at the time of this appointment was obtained as well a phone number she could be reached at in the event of technical difficulties. Adriana Moon and this provider participated in today's telepsychological service.   This provider conducted a brief check-in. [name] reported she feels she is maintaining her weight. This was further explored. She explained she "cut out meat" due to plantar fascitis. Adriana Moon was receptive to discussing the aforementioned with Dr. Juleen China during their next appointment to ensure she is consuming enough protein. Additionally, she reported a reduction in emotional eating. Psychoeducation regarding mindfulness was provided. A handout was provided to [facility] with further information regarding mindfulness, including exercises. This provider also explained the benefit of mindfulness as it relates to emotional eating. Adriana Moon was encouraged to engage in the provided exercises between now and the next appointment with this provider. Adriana Moon agreed. During today's appointment, Adriana Moon was led through a mindfulness exercise involving her senses. Adriana Moon provided verbal  consent during today's appointment for this provider to send a handout about mindfulness via e-mail. Furthermore, termination planning was discussed. Adriana Moon was receptive to a follow-up appointment in 3-4 weeks and an additional follow-up/termination appointment in 3-4 weeks after that. Adriana Moon was receptive to today's appointment as evidenced by openness to sharing, responsiveness to feedback, and willingness to engage in mindfulness exercises to assist with coping.  Mental Status Examination:  Appearance: well groomed and appropriate hygiene  Behavior: appropriate to circumstances Mood: euthymic Affect: mood congruent Speech: normal in rate, volume, and tone Eye Contact: appropriate Psychomotor Activity: appropriate Gait: unable to assess Thought Process: linear, logical, and goal directed  Thought Content/Perception: no hallucinations, delusions, bizarre thinking or behavior reported or observed and no evidence of suicidal and homicidal ideation, plan, and intent Orientation: time, person, place and purpose of appointment Memory/Concentration: memory, attention, language, and fund of knowledge intact  Insight/Judgment: good  Interventions:  Conducted a brief chart review Provided empathic reflections and validation Employed supportive psychotherapy interventions to facilitate reduced distress and to improve coping skills with identified stressors Employed motivational interviewing skills to assess patient's willingness/desire to adhere to recommended medical treatments and assignments Psychoeducation provided regarding mindfulness Engaged patient in mindfulness exercise(s) Employed acceptance and commitment interventions to emphasize mindfulness and acceptance without struggle Discussed termination planning  DSM-5 Diagnosis(es): 311 (F32.8) Other Specified Depressive Disorder, Emotional Eating Behaviors and 300.00 (F41.9) Unspecified Anxiety Disorder  Treatment Goal & Progress:  During the initial appointment with this provider, the following treatment goal was established: decrease emotional eating. Adriana Moon has demonstrated progress in her goal as evidenced by increased awareness of hunger patterns, increased awareness of triggers for emotional eating and reduction in emotional eating. Adriana Moon also continues to demonstrate willingness to engage in learned skill(s).  Plan: The next appointment will be scheduled in approximately three weeks, which will  be via [place] Visit. The next session will focus on working towards the established treatment goal.

## 2019-08-19 ENCOUNTER — Ambulatory Visit (INDEPENDENT_AMBULATORY_CARE_PROVIDER_SITE_OTHER): Payer: BC Managed Care – PPO | Admitting: Family Medicine

## 2019-08-31 ENCOUNTER — Telehealth (INDEPENDENT_AMBULATORY_CARE_PROVIDER_SITE_OTHER): Payer: BC Managed Care – PPO | Admitting: Psychology

## 2019-08-31 DIAGNOSIS — F419 Anxiety disorder, unspecified: Secondary | ICD-10-CM | POA: Diagnosis not present

## 2019-08-31 DIAGNOSIS — F3289 Other specified depressive episodes: Secondary | ICD-10-CM

## 2019-09-02 ENCOUNTER — Ambulatory Visit
Admission: EM | Admit: 2019-09-02 | Discharge: 2019-09-02 | Discharge status: Absent without leave | Payer: BC Managed Care – PPO

## 2019-09-02 ENCOUNTER — Other Ambulatory Visit: Payer: Self-pay

## 2019-09-02 DIAGNOSIS — R0981 Nasal congestion: Secondary | ICD-10-CM | POA: Diagnosis not present

## 2019-09-02 DIAGNOSIS — R5383 Other fatigue: Secondary | ICD-10-CM | POA: Diagnosis not present

## 2019-09-02 DIAGNOSIS — R519 Headache, unspecified: Secondary | ICD-10-CM | POA: Diagnosis not present

## 2019-09-02 DIAGNOSIS — J029 Acute pharyngitis, unspecified: Secondary | ICD-10-CM | POA: Diagnosis not present

## 2019-09-02 DIAGNOSIS — R05 Cough: Secondary | ICD-10-CM | POA: Diagnosis not present

## 2019-09-04 ENCOUNTER — Telehealth: Payer: Self-pay

## 2019-09-04 NOTE — Telephone Encounter (Addendum)
DOS 10/29/2019  EPF B/L - [id number]  BCBS EFFECTIVE DATE - 10/22/2018  PLAN DEDUCTIBLE - $0.00 OUT OF POCKET - $7350.00 W/ [id number] REMAINING COPAY Not Applicable COINSURANCE - 0%  PER AIM WEBSITE NO PRECERT IS REQUIRED FOR CPT [phone]

## 2019-09-07 ENCOUNTER — Encounter (INDEPENDENT_AMBULATORY_CARE_PROVIDER_SITE_OTHER): Payer: Self-pay | Admitting: Family Medicine

## 2019-09-07 ENCOUNTER — Other Ambulatory Visit: Payer: Self-pay

## 2019-09-07 ENCOUNTER — Ambulatory Visit (INDEPENDENT_AMBULATORY_CARE_PROVIDER_SITE_OTHER): Payer: BC Managed Care – PPO | Admitting: Family Medicine

## 2019-09-07 VITALS — BP 116/79 | HR 74 | Temp 97.8°F | Ht 60.0 in | Wt 240.0 lb

## 2019-09-07 DIAGNOSIS — Z6841 Body Mass Index (BMI) 40.0 and over, adult: Secondary | ICD-10-CM

## 2019-09-07 DIAGNOSIS — E8881 Metabolic syndrome: Secondary | ICD-10-CM

## 2019-09-07 DIAGNOSIS — Z9189 Other specified personal risk factors, not elsewhere classified: Secondary | ICD-10-CM

## 2019-09-07 DIAGNOSIS — M722 Plantar fascial fibromatosis: Secondary | ICD-10-CM

## 2019-09-07 DIAGNOSIS — E88819 Insulin resistance, unspecified: Secondary | ICD-10-CM

## 2019-09-07 DIAGNOSIS — E559 Vitamin D deficiency, unspecified: Secondary | ICD-10-CM

## 2019-09-07 NOTE — Progress Notes (Signed)
Chief Complaint:   OBESITY Adriana Moon is here to discuss her progress with her obesity treatment plan along with follow-up of her obesity related diagnoses. Adriana Moon is on the Category 1 Plan and states she is following her eating plan approximately 40% of the time. Adriana Moon states she is exercising for 0 minutes 0 times per week.  Today's visit was #: 8 Starting weight: 260 lbs Starting date: 04/28/2019 Today's weight: 240 lbs Today's date: 09/07/2019 Total lbs lost to date: 20 lbs Total lbs lost since last in-office visit: 1 lb  Interim History: Adriana Moon is down 20 pounds today!.  She said she is off schedule.  She has been craving sweets.  Subjective:   1. Vitamin D deficiency Adriana Moon's Vitamin D level was 22.2 on 04/28/2019. She is currently taking prescription vitamin D 50,000 IU each week. She denies nausea, vomiting or muscle weakness.  2. Plantar fasciitis Followed by Dr. Amalia Hailey with intervention planned soon. Inhibiting exercise.   3. Insulin resistance, with polyphagia Adriana Moon has a diagnosis of insulin resistance based on her elevated fasting insulin level >5. She continues to work on diet and exercise to decrease her risk of diabetes.  Lab Results  Component Value Date   INSULIN 19.4 04/28/2019   Lab Results  Component Value Date   HGBA1C 5.4 04/28/2019   4. At risk for constipation Adriana Moon is at increased risk for constipation due to inadequate water intake, changes in diet, and/or use of medications such as GLP1 agonists. Adriana Moon denies hard, infrequent stools currently.   Assessment/Plan:   1. Vitamin D deficiency Low Vitamin D level contributes to fatigue and are associated with obesity, breast, and colon cancer. She agrees to continue to take prescription Vitamin D @50 ,000 IU every week and will follow-up for routine testing of Vitamin D, at least 2-3 times per year to avoid over-replacement.  Orders - Vitamin D, Ergocalciferol, (DRISDOL) 1.25 MG (50000  UNIT) CAPS capsule; Take 1 capsule (50,000 Units total) by mouth every 7 (seven) days.  Dispense: 4 capsule; Refill: 0  2. Plantar fasciitis Will continue to monitor. This issue directly impacts care plan for optimization of BMI and metabolic health as it impacts the patient's ability to make lifestyle changes.  3. Insulin resistance, with polyphagia Adriana Moon will continue to work on weight loss, exercise, and decreasing simple carbohydrates to help decrease the risk of diabetes. Adriana Moon agreed to follow-up with [name] as directed to closely monitor her progress.  Orders - phentermine (ADIPEX-P) 37.5 MG tablet; Take 1 tablet (37.5 mg total) by mouth daily before breakfast.  Dispense: 15 tablet; Refill: 0  4. At risk for constipation Adriana Moon was given approximately 15 minutes of counseling today regarding prevention of constipation. She was encouraged to increase water and fiber intake.   5. Class 3 severe obesity with serious comorbidity and body mass index (BMI) of 45.0 to 49.9 in adult, unspecified obesity type (Adriana Moon)  Orders - Liraglutide -Weight Management (SAXENDA) 18 MG/3ML SOPN; Inject 0.5 mLs (3 mg total) into the skin daily.  Dispense: 1 pen; Refill: 0 - Insulin Pen Needle (BD PEN NEEDLE NANO 2ND GEN) 32G X 4 MM MISC; 1 Package by Does not apply route daily.  Dispense: 100 each; Refill: 0  Adriana Moon is currently in the action stage of change. As such, her goal is to continue with weight loss efforts. She has agreed to the Category 1 Plan.   Exercise goals: For substantial health benefits, adults should do at least 150 minutes (2 hours  and 30 minutes) a week of moderate-intensity, or 75 minutes (1 hour and 15 minutes) a week of vigorous-intensity aerobic physical activity, or an equivalent combination of moderate- and vigorous-intensity aerobic activity. Aerobic activity should be performed in episodes of at least 10 minutes, and preferably, it should be spread throughout the  week.  Behavioral modification strategies: increasing lean protein intake and increasing water intake.  Adriana Moon has agreed to follow-up with our clinic in 2 weeks. She was informed of the importance of frequent follow-up visits to maximize her success with intensive lifestyle modifications for her multiple health conditions.   Objective:   Blood pressure 116/79, pulse 74, temperature 97.8 F (36.6 C), temperature source Oral, height 5' (1.524 m), weight 240 lb (108.9 kg), SpO2 100 %. Body mass index is 46.87 kg/m.  General: Cooperative, alert, well developed, in no acute distress. HEENT: Conjunctivae and lids unremarkable. Cardiovascular: Regular rhythm.  Lungs: Normal work of breathing. Neurologic: No focal deficits.   Lab Results  Component Value Date   CREATININE 1.06 (H) 04/28/2019   BUN 16 04/28/2019   NA 142 04/28/2019   K 4.2 04/28/2019   CL 105 04/28/2019   CO2 26 04/28/2019   Lab Results  Component Value Date   ALT 7 04/28/2019   AST 7 04/28/2019   ALKPHOS 38 (L) 04/28/2019   BILITOT 0.5 04/28/2019   Lab Results  Component Value Date   HGBA1C 5.4 04/28/2019   HGBA1C 5.5 04/20/2016   Lab Results  Component Value Date   INSULIN 19.4 04/28/2019   Lab Results  Component Value Date   TSH 2.630 04/28/2019   Lab Results  Component Value Date   CHOL 170 04/28/2019   HDL 80 04/28/2019   LDLCALC 80 04/28/2019   TRIG 47 04/28/2019   CHOLHDL 2.3 05/08/2017   Lab Results  Component Value Date   WBC 7.5 04/28/2019   HGB 12.5 04/28/2019   HCT 37.8 04/28/2019   MCV 86 04/28/2019   PLT 338 04/28/2019   Lab Results  Component Value Date   IRON 82 04/28/2019   TIBC 315 04/28/2019   FERRITIN 153 (H) 04/28/2019   Attestation Statements:   Reviewed by clinician on day of visit: allergies, medications, problem list, medical history, surgical history, family history, social history, and previous encounter notes.  I, Water quality scientist, CMA, am acting as  Location manager for [employer], DO.  I have reviewed the above documentation for accuracy and completeness, and I agree with the above. Briscoe Deutscher, DO

## 2019-09-08 ENCOUNTER — Encounter (INDEPENDENT_AMBULATORY_CARE_PROVIDER_SITE_OTHER): Payer: Self-pay | Admitting: *Deleted

## 2019-09-08 MED ORDER — VITAMIN D (ERGOCALCIFEROL) 1.25 MG (50000 UNIT) PO CAPS: 50000.0000 [IU] | ORAL_CAPSULE | ORAL | 0 refills | Status: DC

## 2019-09-08 MED ORDER — SAXENDA 18 MG/3ML ~~LOC~~ SOPN: 3.0000 mg | PEN_INJECTOR | Freq: Every day | SUBCUTANEOUS | 0 refills | Status: DC

## 2019-09-08 MED ORDER — PHENTERMINE HCL 37.5 MG PO TABS: 37.5000 mg | ORAL_TABLET | Freq: Every day | ORAL | 0 refills | Status: DC

## 2019-09-08 MED ORDER — BD PEN NEEDLE NANO 2ND GEN 32G X 4 MM MISC: 1.0000 | Freq: Every day | 0 refills | Status: DC

## 2019-09-08 NOTE — Progress Notes (Signed)
Office: [phone]  /  Fax: [phone]    Date: September 22, 2019   Appointment Start Time: 4:34pm Duration: 19 minutes Provider: Glennie Isle, Psy.D. Type of Session: Individual Therapy  Location of Patient: Parked in car outside store in [place], [place] Location of Provider: Provider's Home Type of Contact: Telepsychological Visit via MyChart Video Visit  Session Content: This provider called Adriana Moon at 4:32pm as she did not present for the telepsychological appointment. She indicated she did not realize she had an appointment with this provider, but noted she could meet. As such, today's appointment was initiated 4 minutes late. Adriana Moon is a 39 y.o. female presenting via [place] Visit for a follow-up appointment to address the previously established treatment goal of increasing coping skills. Today's appointment was a telepsychological visit due to COVID-19. Adriana Moon provided verbal consent for today's telepsychological appointment and she is aware she is responsible for securing confidentiality on her end of the session. Prior to proceeding with today's appointment, Adriana Moon's physical location at the time of this appointment was obtained as well a phone number she could be reached at in the event of technical difficulties. Adriana Moon and this provider participated in today's telepsychological service. Of note, today's appointment was switched to a regular telephone call at 4:40pm with Adriana Moon's verbal consent due to audio issues on her end.   This provider conducted a brief check-in. Adriana Moon shared about recent events, including weight loss to date. Session focused further on mindfulness to assist with coping. She acknowledged she forgot to engage in mindfulness exercises previously shared and requested this provider e-mail the handout again. Adriana Moon was led through a mindfulness exercise (A Taste of Mindfulness) and her experience was processed. Adriana Moon provided verbal consent during  today's appointment for this provider to send the handout for today's exercise via e-mail. This provider also discussed the utilization of YouTube for mindfulness exercises (e.g., exercises by Merri Ray). Adriana Moon was receptive to today's appointment as evidenced by openness to sharing, responsiveness to feedback, and willingness to continue engaging in mindfulness exercises.  Mental Status Examination:  Appearance: well groomed and appropriate hygiene  Behavior: appropriate to circumstances Mood: euthymic Affect: mood congruent Speech: normal in rate, volume, and tone Eye Contact: appropriate Psychomotor Activity: appropriate Gait: unable to assess Thought Process: linear, logical, and goal directed  Thought Content/Perception: no hallucinations, delusions, bizarre thinking or behavior reported or observed and no evidence of suicidal and homicidal ideation, plan, and intent Orientation: time, person, place and purpose of appointment Memory/Concentration: memory, attention, language, and fund of knowledge intact  Insight/Judgment: good  Interventions:  Conducted a brief chart review Provided empathic reflections and validation Reviewed content from the previous session Employed supportive psychotherapy interventions to facilitate reduced distress and to improve coping skills with identified stressors Employed motivational interviewing skills to assess patient's willingness/desire to adhere to recommended medical treatments and assignments Engaged patient in mindfulness exercise(s) Employed acceptance and commitment interventions to emphasize mindfulness and acceptance without struggle  DSM-5 Diagnosis(es): 311 (F32.8) Other Specified Depressive Disorder, Emotional Eating Behaviors and 300.00 (F41.9) Unspecified Anxiety Disorder  Treatment Goal & Progress: During the initial appointment with this provider, the following treatment goal was established: decrease emotional eating.  Adriana Moon has demonstrated progress in her goal as evidenced by increased awareness of hunger patterns, increased awareness of triggers for emotional eating and reduction in emotional eating. Adriana Moon also continues to demonstrate willingness to engage in learned skill(s).  Plan: The next appointment will be scheduled in one month, which will be via MyChart  Video Visit. The next session will focus on working towards the established treatment goal.

## 2019-09-22 ENCOUNTER — Other Ambulatory Visit: Payer: Self-pay

## 2019-09-22 ENCOUNTER — Telehealth (INDEPENDENT_AMBULATORY_CARE_PROVIDER_SITE_OTHER): Payer: BC Managed Care – PPO | Admitting: Psychology

## 2019-09-22 DIAGNOSIS — F419 Anxiety disorder, unspecified: Secondary | ICD-10-CM | POA: Diagnosis not present

## 2019-09-22 DIAGNOSIS — F3289 Other specified depressive episodes: Secondary | ICD-10-CM | POA: Diagnosis not present

## 2019-09-23 ENCOUNTER — Encounter: Payer: BC Managed Care – PPO | Admitting: Podiatry

## 2019-09-24 ENCOUNTER — Encounter (INDEPENDENT_AMBULATORY_CARE_PROVIDER_SITE_OTHER): Payer: Self-pay | Admitting: Family Medicine

## 2019-09-24 ENCOUNTER — Ambulatory Visit (INDEPENDENT_AMBULATORY_CARE_PROVIDER_SITE_OTHER): Payer: BC Managed Care – PPO | Admitting: Family Medicine

## 2019-09-24 ENCOUNTER — Other Ambulatory Visit: Payer: Self-pay

## 2019-09-24 VITALS — BP 111/71 | HR 81 | Temp 98.2°F | Ht 60.0 in | Wt 234.0 lb

## 2019-09-24 DIAGNOSIS — E8881 Metabolic syndrome: Secondary | ICD-10-CM

## 2019-09-24 DIAGNOSIS — E559 Vitamin D deficiency, unspecified: Secondary | ICD-10-CM

## 2019-09-24 DIAGNOSIS — Z6841 Body Mass Index (BMI) 40.0 and over, adult: Secondary | ICD-10-CM

## 2019-09-24 DIAGNOSIS — Z9189 Other specified personal risk factors, not elsewhere classified: Secondary | ICD-10-CM | POA: Diagnosis not present

## 2019-09-24 MED ORDER — VICTOZA 18 MG/3ML ~~LOC~~ SOPN: PEN_INJECTOR | SUBCUTANEOUS | 0 refills | Status: DC

## 2019-09-24 MED ORDER — BD PEN NEEDLE NANO 2ND GEN 32G X 4 MM MISC: 1.0000 | Freq: Every day | 0 refills | Status: DC

## 2019-09-28 NOTE — Progress Notes (Signed)
Chief Complaint:   OBESITY Adriana Moon is here to discuss her progress with her obesity treatment plan along with follow-up of her obesity related diagnoses. Adriana Moon is on the Category 1 Plan and states she is following her eating plan approximately 50% of the time. Adriana Moon states she is working 6 times per week.  Today's visit was #: 9 Starting weight: 260 lbs Starting date: 04/28/2019 Today's weight: 234 lbs Today's date: 09/24/2019 Total lbs lost to date: 26 lbs Total lbs lost since last in-office visit: 6 lbs  Interim History: Adriana Moon has found that she is not getting all the protein in but is staying within her calorie goal.  She thinks she may be getting around 60 grams of protein per day if she really tries.  She has Marine scientist for lunch and is often skipping dinner.  She has surgery scheduled for the beginning of July.  Subjective:   1. Insulin resistance Adriana Moon has a diagnosis of insulin resistance based on her elevated fasting insulin level >5. She continues to work on diet and exercise to decrease her risk of diabetes.  She has not started [name] due to insurance not covering it.  She found herself overeating higher sugar fruit.  Lab Results  Component Value Date   INSULIN 19.4 04/28/2019   Lab Results  Component Value Date   HGBA1C 5.4 04/28/2019   2. Vitamin D deficiency Adriana Moon's Vitamin D level was 22.2 on 04/28/2019. She is currently taking prescription vitamin D 50,000 IU each week. She denies nausea, vomiting or muscle weakness.  She endorses fatigue.  3. At risk for diabetes mellitus Adriana Moon is at higher than average risk for developing diabetes due to her obesity.   Assessment/Plan:   1. Insulin resistance Adriana Moon will start Victoza, as per below. - liraglutide (VICTOZA) 18 MG/3ML SOPN; Start 0.6mg  SQ once a day  Dispense: 2 pen; Refill: 0  2. Vitamin D deficiency Low Vitamin D level contributes to fatigue and are associated with obesity, breast,  and colon cancer. She agrees to continue to take prescription Vitamin D @50 ,000 IU every week and will follow-up for routine testing of Vitamin D, at least 2-3 times per year to avoid over-replacement.  3. At risk for diabetes mellitus Adriana Moon was given approximately 15 minutes of diabetes education and counseling today. We discussed intensive lifestyle modifications today with an emphasis on weight loss as well as increasing exercise and decreasing simple carbohydrates in her diet. We also reviewed medication options with an emphasis on risk versus benefit of those discussed.   Repetitive spaced learning was employed today to elicit superior memory formation and behavioral change.  4. Class 3 severe obesity with serious comorbidity and body mass index (BMI) of 45.0 to 49.9 in adult, unspecified obesity type (HCC) - Insulin Pen Needle (BD PEN NEEDLE NANO 2ND GEN) 32G X 4 MM MISC; 1 Package by Does not apply route daily.  Dispense: 100 each; Refill: 0  Adriana Moon is currently in the action stage of change. As such, her goal is to continue with weight loss efforts. She has agreed to keeping a food journal and adhering to recommended goals of 1000 calories and 60+ grams of protein.   Exercise goals: As is.  Behavioral modification strategies: increasing lean protein intake, meal planning and cooking strategies, keeping healthy foods in the home and planning for success.  Adriana Moon has agreed to follow-up with our clinic in 2-3 weeks. She was informed of the importance of frequent follow-up visits to  maximize her success with intensive lifestyle modifications for her multiple health conditions.   Objective:   Blood pressure 111/71, pulse 81, temperature 98.2 F (36.8 C), temperature source Oral, height 5' (1.524 m), weight 234 lb (106.1 kg), SpO2 100 %. Body mass index is 45.7 kg/m.  General: Cooperative, alert, well developed, in no acute distress. HEENT: Conjunctivae and lids  unremarkable. Cardiovascular: Regular rhythm.  Lungs: Normal work of breathing. Neurologic: No focal deficits.   Lab Results  Component Value Date   CREATININE 1.06 (H) 04/28/2019   BUN 16 04/28/2019   NA 142 04/28/2019   K 4.2 04/28/2019   CL 105 04/28/2019   CO2 26 04/28/2019   Lab Results  Component Value Date   ALT 7 04/28/2019   AST 7 04/28/2019   ALKPHOS 38 (L) 04/28/2019   BILITOT 0.5 04/28/2019   Lab Results  Component Value Date   HGBA1C 5.4 04/28/2019   HGBA1C 5.5 04/20/2016   Lab Results  Component Value Date   INSULIN 19.4 04/28/2019   Lab Results  Component Value Date   TSH 2.630 04/28/2019   Lab Results  Component Value Date   CHOL 170 04/28/2019   HDL 80 04/28/2019   LDLCALC 80 04/28/2019   TRIG 47 04/28/2019   CHOLHDL 2.3 05/08/2017   Lab Results  Component Value Date   WBC 7.5 04/28/2019   HGB 12.5 04/28/2019   HCT 37.8 04/28/2019   MCV 86 04/28/2019   PLT 338 04/28/2019   Lab Results  Component Value Date   IRON 82 04/28/2019   TIBC 315 04/28/2019   FERRITIN 153 (H) 04/28/2019   Attestation Statements:   Reviewed by clinician on day of visit: allergies, medications, problem list, medical history, surgical history, family history, social history, and previous encounter notes.  I, Water quality scientist, CMA, am acting as transcriptionist for Coralie Common, MD.  I have reviewed the above documentation for accuracy and completeness, and I agree with the above. - Jinny Blossom, MD

## 2019-09-30 ENCOUNTER — Encounter: Payer: BC Managed Care – PPO | Admitting: Podiatry

## 2019-10-06 NOTE — Progress Notes (Signed)
Office: [phone]  /  Fax: [phone]    Date: October 20, 2019   Appointment Start Time: 4:33pm Duration: 21 minutes Provider: Glennie Isle, Psy.D. Type of Session: Individual Therapy  Location of Patient: Parked in car at home Location of Provider: [facility] Type of Contact: Telepsychological Visit via MyChart Video Visit  Session Content: This provider called Adriana Moon at 4:32pm as she did not present for the telepsychological appointment. She requested this provider send her the link via text. As such, today's appointment was initiated 3 minutes late. Adriana Moon is a 39 y.o. female presenting via [place] Visit for a follow-up appointment to address the previously established treatment goal of decreasing emotional eating. Today's appointment was a telepsychological visit due to COVID-19. Adriana Moon provided verbal consent for today's telepsychological appointment and she is aware she is responsible for securing confidentiality on her end of the session. Prior to proceeding with today's appointment, Adriana Moon's physical location at the time of this appointment was obtained as well a phone number she could be reached at in the event of technical difficulties. Adriana Moon and this provider participated in today's telepsychological service.   This provider conducted a brief check-in and verbally administered the PHQ-9 and GAD-7. Adriana Moon shared about her upcoming foot surgery. She shared about recent progress with the clinic, noting, "I feel better." A plan was developed to help Adriana Moon cope with emotional eating in the future using learned skills. She wrote down the following plan: focus on hydration; be prepared with snacks congruent to the meal plan; pause to ask questions when triggered to eat (e.g., [name], Is there something bothering me?, and [name]); and engage in discussed coping strategies after going through the aforementioned  questions. Adriana Moon was receptive to today's appointment as evidenced by openness to sharing, responsiveness to feedback, and willingness to continue engaging in learned skills.  Mental Status Examination:  Appearance: well groomed and appropriate hygiene  Behavior: appropriate to circumstances Mood: euthymic Affect: mood congruent Speech: normal in rate, volume, and tone Eye Contact: appropriate Psychomotor Activity: appropriate Gait: unable to assess Thought Process: linear, logical, and goal directed  Thought Content/Perception: no hallucinations, delusions, bizarre thinking or behavior reported or observed and no evidence of suicidal and homicidal ideation, plan, and intent Orientation: time, person, place, and purpose of appointment Memory/Concentration: memory, attention, language, and fund of knowledge intact  Insight/Judgment: good  Structured Assessments Results: The Patient Health Questionnaire-9 (PHQ-9) is a self-report measure that assesses symptoms and severity of depression over the course of the last two weeks. Adriana Moon obtained a score of 3 suggesting minimal depression. Adriana Moon finds the endorsed symptoms to be very difficult. [0= Not at all; 1= Several days; 2= More than half the days; 3= Nearly every day] Little interest or pleasure in doing things 0  Feeling down, depressed, or hopeless- due to pain in her feet 3  Trouble falling or staying asleep, or sleeping too much 0  Feeling tired or having little energy 0  Poor appetite or overeating 0  Feeling bad about yourself --- or that you are a failure or have let yourself or your family down 0  Trouble concentrating on things, such as reading the newspaper or watching television 0  Moving or speaking so slowly that other people could have noticed? Or the opposite --- being so fidgety or restless that you have been moving around a lot more than usual 0  Thoughts that you would be better  off dead or hurting yourself in some  way 0  PHQ-9 Score 3    The Generalized Anxiety Disorder-7 (GAD-7) is a brief self-report measure that assesses symptoms of anxiety over the course of the last two weeks. Adriana Moon obtained a score of 6 suggesting mild anxiety. Adriana Moon finds the endorsed symptoms to be very difficult. [0= Not at all; 1= Several days; 2= Over half the days; 3= Nearly every day] Feeling nervous, anxious, on edge 0  Not being able to stop or control worrying 0  Worrying too much about different things 0  Trouble relaxing- due to pain in her feet 3  Being so restless that it's hard to sit still 0  Becoming easily annoyed or irritable- due to pain in her feet 3  Feeling afraid as if something awful might happen 0  GAD-7 Score 6   Interventions:  Conducted a brief chart review Verbally administered PHQ-9 and GAD-7 for symptom monitoring Provided empathic reflections and validation Employed supportive psychotherapy interventions to facilitate reduced distress and to improve coping skills with identified stressors Reviewed learned skills  DSM-5 Diagnosis(es): 311 (F32.8) Other Specified Depressive Disorder, Emotional Eating Behaviors and 300.00 (F41.9) Unspecified Anxiety Disorder  Treatment Goal & Progress: During the initial appointment with this provider, the following treatment goal was established: decrease emotional eating. Adriana Moon demonstrated progress in her goal as evidenced by increased awareness of hunger patterns, increased awareness of triggers for emotional eating and reduction in emotional eating. Adriana Moon also continues to demonstrate willingness to engage in learned skill(s).  Plan: As previously planned, today was Adriana Moon last appointment with this provider. She acknowledged understanding that she may request a follow-up appointment with this provider in the future as long as she is still established with the clinic. No further follow-up planned by this provider.

## 2019-10-12 DIAGNOSIS — M722 Plantar fascial fibromatosis: Secondary | ICD-10-CM | POA: Diagnosis not present

## 2019-10-15 ENCOUNTER — Other Ambulatory Visit (INDEPENDENT_AMBULATORY_CARE_PROVIDER_SITE_OTHER): Payer: Self-pay | Admitting: Family Medicine

## 2019-10-15 ENCOUNTER — Encounter (INDEPENDENT_AMBULATORY_CARE_PROVIDER_SITE_OTHER): Payer: Self-pay

## 2019-10-15 ENCOUNTER — Ambulatory Visit (INDEPENDENT_AMBULATORY_CARE_PROVIDER_SITE_OTHER): Payer: BC Managed Care – PPO | Admitting: Family Medicine

## 2019-10-15 DIAGNOSIS — E8881 Metabolic syndrome: Secondary | ICD-10-CM

## 2019-10-19 ENCOUNTER — Encounter: Payer: BC Managed Care – PPO | Admitting: Podiatry

## 2019-10-20 ENCOUNTER — Telehealth (INDEPENDENT_AMBULATORY_CARE_PROVIDER_SITE_OTHER): Payer: BC Managed Care – PPO | Admitting: Psychology

## 2019-10-20 ENCOUNTER — Other Ambulatory Visit: Payer: Self-pay

## 2019-10-20 DIAGNOSIS — F419 Anxiety disorder, unspecified: Secondary | ICD-10-CM | POA: Diagnosis not present

## 2019-10-20 DIAGNOSIS — F3289 Other specified depressive episodes: Secondary | ICD-10-CM | POA: Diagnosis not present

## 2019-10-22 ENCOUNTER — Other Ambulatory Visit: Payer: Self-pay | Admitting: Podiatry

## 2019-10-22 DIAGNOSIS — M722 Plantar fascial fibromatosis: Secondary | ICD-10-CM | POA: Diagnosis not present

## 2019-10-22 DIAGNOSIS — K219 Gastro-esophageal reflux disease without esophagitis: Secondary | ICD-10-CM | POA: Diagnosis not present

## 2019-10-22 MED ORDER — OXYCODONE-ACETAMINOPHEN 5-325 MG PO TABS: 1.0000 | ORAL_TABLET | Freq: Four times a day (QID) | ORAL | 0 refills | Status: DC | PRN

## 2019-10-22 NOTE — Progress Notes (Signed)
PRN postop 

## 2019-10-23 ENCOUNTER — Other Ambulatory Visit: Payer: Self-pay | Admitting: Podiatry

## 2019-10-23 ENCOUNTER — Telehealth: Payer: Self-pay | Admitting: Podiatry

## 2019-10-23 MED ORDER — ONDANSETRON HCL 4 MG PO TABS: 4.0000 mg | ORAL_TABLET | Freq: Three times a day (TID) | ORAL | 0 refills | Status: DC | PRN

## 2019-10-23 MED ORDER — HYDROMORPHONE HCL 4 MG PO TABS: 4.0000 mg | ORAL_TABLET | ORAL | 0 refills | Status: DC | PRN

## 2019-10-23 NOTE — Telephone Encounter (Signed)
Pt had surgery yesterday (10/22/2019) and states that the Oxycodone she is taking is making her severely nauseous and dizzy/light headed. Pt would like for the doctor to call in something different for pain.   Please give patient a call.

## 2019-10-23 NOTE — Telephone Encounter (Signed)
Dr. Amalia Hailey sent alternate pain medication to pt and I informed she should stop the percocet.

## 2019-10-23 NOTE — Progress Notes (Signed)
PRN postop. Patient experiencing Nausea and Vomiting with Percocet.

## 2019-10-23 NOTE — Telephone Encounter (Signed)
I told pt that most narcotic pain medication gave the same problems she was complaining of and I would get orders for a medication for the nausea. Pt states she has that zofran. I told pt I would contact Dr. Amalia Hailey. I sent a SecureChat message to Dr. Amalia Hailey and asked Murlean Caller, CMA to advise Dr. Amalia Hailey of the message.

## 2019-10-24 ENCOUNTER — Encounter: Payer: Self-pay | Admitting: Podiatry

## 2019-10-28 ENCOUNTER — Encounter: Payer: Self-pay | Admitting: Podiatry

## 2019-10-28 ENCOUNTER — Other Ambulatory Visit: Payer: Self-pay

## 2019-10-28 ENCOUNTER — Ambulatory Visit (INDEPENDENT_AMBULATORY_CARE_PROVIDER_SITE_OTHER): Payer: BC Managed Care – PPO | Admitting: Podiatry

## 2019-10-28 DIAGNOSIS — Z9889 Other specified postprocedural states: Secondary | ICD-10-CM

## 2019-10-28 DIAGNOSIS — M722 Plantar fascial fibromatosis: Secondary | ICD-10-CM

## 2019-10-28 NOTE — Progress Notes (Signed)
   Subjective:  Patient presents today status post EPF bilateral. DOS: 10/22/2019.  Patient states that the pain medicine she received made her nauseous.  The pain is tolerable at the moment currently rates the pain as a 4/10.  She has been weightbearing in the surgical shoes as directed no new complaints at this time  Past Medical History:  Diagnosis Date  . Angio-edema   . Anxiety    manages naturally  . Arthritis of both knees   . Asthma   . Bilateral swelling of feet   . Breast cancer ([place])   . Breast disorder    cancer  . Depression    pt. reports that she tends to get down & has had Lexapro in the past but its been taken off her list for now.   . Family history of breast cancer   . Family history of kidney cancer   . Family history of lung cancer   . Family history of prostate cancer   . GERD (gastroesophageal reflux disease)   . History of kidney problems   . IBS (irritable bowel syndrome)   . Joint pain   . Lactose intolerance   . Migraines   . PCOS (polycystic ovarian syndrome)   . Urticaria   . Varicose veins of legs       Objective/Physical Exam Neurovascular status intact.  Skin incisions appear to be well coapted with sutures and staples intact. No sign of infectious process noted. No dehiscence. No active bleeding noted. Moderate edema noted to the surgical extremity.  Assessment: 1. s/p EPF bilateral. DOS: 10/22/2019   Plan of Care:  1. Patient was evaluated.  Antibiotic ointment and Band-Aid applied 2.  Patient may begin getting the foot wet.  Compression ankle sleeves dispensed 3.  Return to clinic in 1 week for suture removal   Edrick Kins, DPM [facility]  Dr. Edrick Kins, [address]                                        [place],  [address]                Office [phone]  Fax [phone]

## 2019-11-04 ENCOUNTER — Other Ambulatory Visit: Payer: Self-pay

## 2019-11-04 ENCOUNTER — Ambulatory Visit (INDEPENDENT_AMBULATORY_CARE_PROVIDER_SITE_OTHER): Payer: BC Managed Care – PPO | Admitting: Podiatry

## 2019-11-04 ENCOUNTER — Ambulatory Visit (INDEPENDENT_AMBULATORY_CARE_PROVIDER_SITE_OTHER): Payer: BC Managed Care – PPO | Admitting: Family Medicine

## 2019-11-04 ENCOUNTER — Encounter: Payer: BC Managed Care – PPO | Admitting: Podiatry

## 2019-11-04 ENCOUNTER — Encounter (INDEPENDENT_AMBULATORY_CARE_PROVIDER_SITE_OTHER): Payer: Self-pay | Admitting: Family Medicine

## 2019-11-04 ENCOUNTER — Encounter: Payer: Self-pay | Admitting: Podiatry

## 2019-11-04 VITALS — BP 98/59 | HR 68 | Temp 98.1°F | Ht 60.0 in | Wt 240.0 lb

## 2019-11-04 DIAGNOSIS — E88819 Insulin resistance, unspecified: Secondary | ICD-10-CM

## 2019-11-04 DIAGNOSIS — M79672 Pain in left foot: Secondary | ICD-10-CM

## 2019-11-04 DIAGNOSIS — E66813 Obesity, class 3: Secondary | ICD-10-CM

## 2019-11-04 DIAGNOSIS — E559 Vitamin D deficiency, unspecified: Secondary | ICD-10-CM

## 2019-11-04 DIAGNOSIS — M79671 Pain in right foot: Secondary | ICD-10-CM

## 2019-11-04 DIAGNOSIS — E8881 Metabolic syndrome: Secondary | ICD-10-CM

## 2019-11-04 DIAGNOSIS — Z9889 Other specified postprocedural states: Secondary | ICD-10-CM

## 2019-11-04 DIAGNOSIS — M722 Plantar fascial fibromatosis: Secondary | ICD-10-CM

## 2019-11-04 DIAGNOSIS — Z6841 Body Mass Index (BMI) 40.0 and over, adult: Secondary | ICD-10-CM

## 2019-11-04 NOTE — Progress Notes (Signed)
   Subjective:  Patient presents today status post EPF bilateral. DOS: 10/22/2019. Patient is doing very well and wearing her [place]oka Shoes today. Tolerable pain. No new complaints at this time  Past Medical History:  Diagnosis Date  . Angio-edema   . Anxiety    manages naturally  . Arthritis of both knees   . Asthma   . Bilateral swelling of feet   . Breast cancer ([place])   . Breast disorder    cancer  . Depression    pt. reports that she tends to get down & has had Lexapro in the past but its been taken off her list for now.   . Family history of breast cancer   . Family history of kidney cancer   . Family history of lung cancer   . Family history of prostate cancer   . GERD (gastroesophageal reflux disease)   . History of kidney problems   . IBS (irritable bowel syndrome)   . Joint pain   . Lactose intolerance   . Migraines   . PCOS (polycystic ovarian syndrome)   . Urticaria   . Varicose veins of legs       Objective/Physical Exam Neurovascular status intact.  Skin incisions appear to be well coapted with sutures intact. No sign of infectious process noted. No dehiscence. No active bleeding noted. Moderate edema noted to the surgical extremity. Minimal tenderness to the incision sites  Assessment: 1. s/p EPF bilateral. DOS: 10/22/2019   Plan of Care:  1. Patient was evaluated.   2. Sutures removed today 3. Compression anklets dispensed. Wear daily 4. Continue Hoka Shoes. 5. RTC in 2-3 weeks to discuss RTW date.   *Works at [employer], DPM [facility]  Dr. Edrick Kins, [address]                                        [place], [place] [address]                Office [phone]  Fax [phone]

## 2019-11-05 NOTE — Progress Notes (Signed)
Chief Complaint:   OBESITY Tinea is here to discuss her progress with her obesity treatment plan along with follow-up of her obesity related diagnoses. Ladrea is on the Category 1 Plan and states she is following her eating plan approximately 10% of the time. Heiley states she is exercising for 0 minutes 0 times per week.  Today's visit was #: 10 Starting weight: 260 lbs Starting date: 04/28/2019 Today's weight: 240 lbs Today's date: 11/04/2019 Total lbs lost to date: 20 lbs Total lbs lost since last in-office visit: 0  Interim History: Marleni is status post bilateral plantar fasciitis surgery.  She has not yet started Victoza and endorses polyphagia.  She has been released from Dr. Mallie Mussel.  She says she is not taking phentermine.  Subjective:   1. Bilateral foot pain Status post food surgery.  Healing well.  Still with sutures in place.  2. Vitamin D deficiency Rei's Vitamin D level was 22.2 on 04/28/2019. She is currently taking prescription vitamin D 50,000 IU each week. She denies nausea, vomiting or muscle weakness.  3. Insulin resistance Arielis has a diagnosis of insulin resistance based on her elevated fasting insulin level >5. She continues to work on diet and exercise to decrease her risk of diabetes.  Lab Results  Component Value Date   INSULIN 19.4 04/28/2019   Lab Results  Component Value Date   HGBA1C 5.4 04/28/2019   Assessment/Plan:   1. Bilateral foot pain Will continue to monitor.  2. Vitamin D deficiency Low Vitamin D level contributes to fatigue and are associated with obesity, breast, and colon cancer. She agrees to continue to take prescription Vitamin D @50 ,000 IU every week and will follow-up for routine testing of Vitamin D, at least 2-3 times per year to avoid over-replacement.  3. Insulin resistance Marinna will continue to work on weight loss, exercise, and decreasing simple carbohydrates to help decrease the risk of diabetes.  Sosie agreed to follow-up with [name] as directed to closely monitor her progress.  Orders - metFORMIN (GLUCOPHAGE) 500 MG tablet; Take 1 tablet (500 mg total) by mouth 2 (two) times daily.  Dispense: 60 tablet; Refill: 0  4. Class 3 severe obesity with serious comorbidity and body mass index (BMI) of 45.0 to 49.9 in adult, unspecified obesity type (HCC) Yaqueline is currently in the action stage of change. As such, her goal is to continue with weight loss efforts. She has agreed to the Category 1 Plan.   Exercise goals: No exercise has been prescribed at this time.   Behavioral modification strategies: increasing lean protein intake.  Start Victoza.  Evalyne has agreed to follow-up with our clinic in 2-3 weeks. She was informed of the importance of frequent follow-up visits to maximize her success with intensive lifestyle modifications for her multiple health conditions.   Objective:   Blood pressure (!) 98/59, pulse 68, temperature 98.1 F (36.7 C), temperature source Oral, height 5' (1.524 m), weight 240 lb (108.9 kg), SpO2 100 %. Body mass index is 46.87 kg/m.  General: Cooperative, alert, well developed, in no acute distress. HEENT: Conjunctivae and lids unremarkable. Cardiovascular: Regular rhythm.  Lungs: Normal work of breathing. Neurologic: No focal deficits.   Lab Results  Component Value Date   CREATININE 1.06 (H) 04/28/2019   BUN 16 04/28/2019   NA 142 04/28/2019   K 4.2 04/28/2019   CL 105 04/28/2019   CO2 26 04/28/2019   Lab Results  Component Value Date   ALT 7 04/28/2019  AST 7 04/28/2019   ALKPHOS 38 (L) 04/28/2019   BILITOT 0.5 04/28/2019   Lab Results  Component Value Date   HGBA1C 5.4 04/28/2019   HGBA1C 5.5 04/20/2016   Lab Results  Component Value Date   INSULIN 19.4 04/28/2019   Lab Results  Component Value Date   TSH 2.630 04/28/2019   Lab Results  Component Value Date   CHOL 170 04/28/2019   HDL 80 04/28/2019   LDLCALC 80  04/28/2019   TRIG 47 04/28/2019   CHOLHDL 2.3 05/08/2017   Lab Results  Component Value Date   WBC 7.5 04/28/2019   HGB 12.5 04/28/2019   HCT 37.8 04/28/2019   MCV 86 04/28/2019   PLT 338 04/28/2019   Lab Results  Component Value Date   IRON 82 04/28/2019   TIBC 315 04/28/2019   FERRITIN 153 (H) 04/28/2019   Attestation Statements:   Reviewed by clinician on day of visit: allergies, medications, problem list, medical history, surgical history, family history, social history, and previous encounter notes.  I, Water quality scientist, CMA, am acting as transcriptionist for Briscoe Deutscher, DO  I have reviewed the above documentation for accuracy and completeness, and I agree with the above. Briscoe Deutscher, DO

## 2019-11-09 MED ORDER — METFORMIN HCL 500 MG PO TABS: 500.0000 mg | ORAL_TABLET | Freq: Two times a day (BID) | ORAL | 0 refills | Status: DC

## 2019-11-11 ENCOUNTER — Encounter: Payer: BC Managed Care – PPO | Admitting: Podiatry

## 2019-11-23 ENCOUNTER — Ambulatory Visit (INDEPENDENT_AMBULATORY_CARE_PROVIDER_SITE_OTHER): Payer: BC Managed Care – PPO | Admitting: Podiatry

## 2019-11-23 ENCOUNTER — Other Ambulatory Visit: Payer: Self-pay

## 2019-11-23 ENCOUNTER — Encounter: Payer: Self-pay | Admitting: Podiatry

## 2019-11-23 DIAGNOSIS — M722 Plantar fascial fibromatosis: Secondary | ICD-10-CM

## 2019-11-23 DIAGNOSIS — Z9889 Other specified postprocedural states: Secondary | ICD-10-CM

## 2019-11-23 NOTE — Progress Notes (Signed)
   Subjective:  Patient presents today status post EPF bilateral. DOS: 10/22/2019. Patient is doing very well and wearing her [employer]oka Shoes today.  Patient states that the pain remains at about 2-3/10.  No new complaints at this time  Past Medical History:  Diagnosis Date  . Angio-edema   . Anxiety    manages naturally  . Arthritis of both knees   . Asthma   . Bilateral swelling of feet   . Breast cancer ([place])   . Breast disorder    cancer  . Depression    pt. reports that she tends to get down & has had Lexapro in the past but its been taken off her list for now.   . Family history of breast cancer   . Family history of kidney cancer   . Family history of lung cancer   . Family history of prostate cancer   . GERD (gastroesophageal reflux disease)   . History of kidney problems   . IBS (irritable bowel syndrome)   . Joint pain   . Lactose intolerance   . Migraines   . PCOS (polycystic ovarian syndrome)   . Urticaria   . Varicose veins of legs       Objective/Physical Exam Neurovascular status intact.  Skin incisions appear to be well coapted and healed. No sign of infectious process noted. No dehiscence. No active bleeding noted.  Negative for any edema to the surgical extremity. Minimal tenderness to the incision sites with palpation  Assessment: 1. s/p EPF bilateral. DOS: 10/22/2019   Plan of Care:  1. Patient was evaluated.   2.  Continue wearing Hoka shoes daily 3.  Continue custom molded orthotics.  Appointment with Pedorthist for new custom molded insoles 4.  Return to work half shift only beginning 12/07/2019.  Patient may return to work full activity full shift beginning 12/29/2019 5.  Return to clinic in 2 months for final follow-up  *Works at [employer], DPM [facility]  Dr. Edrick Kins, [address]                                        [address], [place] [address]                Office [phone]  Fax [phone]

## 2019-11-25 ENCOUNTER — Encounter: Payer: BC Managed Care – PPO | Admitting: Podiatry

## 2019-11-30 ENCOUNTER — Encounter: Payer: BC Managed Care – PPO | Admitting: Nurse Practitioner

## 2019-11-30 ENCOUNTER — Ambulatory Visit (INDEPENDENT_AMBULATORY_CARE_PROVIDER_SITE_OTHER): Payer: BC Managed Care – PPO | Admitting: Family Medicine

## 2019-12-01 ENCOUNTER — Ambulatory Visit (INDEPENDENT_AMBULATORY_CARE_PROVIDER_SITE_OTHER): Payer: BC Managed Care – PPO | Admitting: Orthotics

## 2019-12-01 ENCOUNTER — Encounter (INDEPENDENT_AMBULATORY_CARE_PROVIDER_SITE_OTHER): Payer: Self-pay | Admitting: Family Medicine

## 2019-12-01 ENCOUNTER — Ambulatory Visit (INDEPENDENT_AMBULATORY_CARE_PROVIDER_SITE_OTHER): Payer: BC Managed Care – PPO | Admitting: Family Medicine

## 2019-12-01 ENCOUNTER — Other Ambulatory Visit: Payer: Self-pay

## 2019-12-01 VITALS — BP 109/73 | HR 85 | Temp 98.4°F | Ht 60.0 in | Wt 242.0 lb

## 2019-12-01 DIAGNOSIS — E559 Vitamin D deficiency, unspecified: Secondary | ICD-10-CM | POA: Diagnosis not present

## 2019-12-01 DIAGNOSIS — Z9889 Other specified postprocedural states: Secondary | ICD-10-CM

## 2019-12-01 DIAGNOSIS — Z9189 Other specified personal risk factors, not elsewhere classified: Secondary | ICD-10-CM | POA: Diagnosis not present

## 2019-12-01 DIAGNOSIS — E8881 Metabolic syndrome: Secondary | ICD-10-CM

## 2019-12-01 DIAGNOSIS — F3289 Other specified depressive episodes: Secondary | ICD-10-CM

## 2019-12-01 DIAGNOSIS — N951 Menopausal and female climacteric states: Secondary | ICD-10-CM

## 2019-12-01 DIAGNOSIS — M722 Plantar fascial fibromatosis: Secondary | ICD-10-CM | POA: Diagnosis not present

## 2019-12-01 DIAGNOSIS — Z6841 Body Mass Index (BMI) 40.0 and over, adult: Secondary | ICD-10-CM

## 2019-12-01 MED ORDER — VITAMIN D (ERGOCALCIFEROL) 1.25 MG (50000 UNIT) PO CAPS: 50000.0000 [IU] | ORAL_CAPSULE | ORAL | 0 refills | Status: DC

## 2019-12-01 MED ORDER — ESCITALOPRAM OXALATE 10 MG PO TABS: 10.0000 mg | ORAL_TABLET | Freq: Every day | ORAL | 0 refills | Status: DC

## 2019-12-01 MED ORDER — PHENTERMINE HCL 37.5 MG PO TABS: 37.5000 mg | ORAL_TABLET | Freq: Every day | ORAL | 0 refills | Status: DC

## 2019-12-01 NOTE — Progress Notes (Signed)

## 2019-12-03 NOTE — Progress Notes (Signed)
Chief Complaint:   OBESITY Adriana Moon is here to discuss her progress with her obesity treatment plan along with follow-up of her obesity related diagnoses. Adriana Moon is on the Category 1 Plan and states she is following her eating plan approximately 30% of the time. Adriana Moon states she is doing 0 minutes 0 times per week.  Today's visit was #: 11 Starting weight: 260 lbs Starting date: 04/28/2019 Today's weight: 242 lbs Today's date: 12/02/2019 Total lbs lost to date: 18 Total lbs lost since last in-office visit: 0  Interim History: Adriana Moon has had both feet operated on. She reports she relied on others to bring her food which is often not on the plan. She has started Victoza without much noticeable difference in appetite. She is cleared to start minimal activity. Her phentermine ran out and she was taking and entire pill. No contract on file.  Subjective:   1. Insulin resistance Adriana Moon denies GI side effects of Victoza. She reports she hasn't noticed much of a difference in terms of control of carbohydrate cravings.  2. Vitamin D deficiency Adriana Moon denies nausea, vomiting, or muscle weakness, but she notes fatigue.  3. Other depression, with emotional eating Adriana Moon denies suicidal ideas or homicidal ideas. She was previously on Lexapro with increase in symptoms.  4. At risk for osteoporosis Adriana Moon is at higher risk of osteopenia and osteoporosis due to Vitamin D deficiency.   Assessment/Plan:   1. Insulin resistance Adriana Moon will continue to work on weight loss, exercise, and decreasing simple carbohydrates to help decrease the risk of diabetes. Adriana Moon agreed to increase Victoza to 0.9 mg SubQ daily, no refill needed. Adriana Moon agreed to follow-up with [name] as directed to closely monitor her progress.  2. Vitamin D deficiency Low Vitamin D level contributes to fatigue and are associated with obesity, breast, and colon cancer. We will refill prescription Vitamin D for 1  month. Adriana Moon will follow-up for routine testing of Vitamin D, at least 2-3 times per year to avoid over-replacement.  - Vitamin D, Ergocalciferol, (DRISDOL) 1.25 MG (50000 UNIT) CAPS capsule; Take 1 capsule (50,000 Units total) by mouth every 7 (seven) days.  Dispense: 4 capsule; Refill: 0  3. Other depression, with emotional eating Behavior modification techniques were discussed today to help Adriana Moon deal with her emotional/non-hunger eating behaviors. Adriana Moon agreed to start Lexapro 10 mg PO daily with no refills. Orders and follow up as documented in patient record.   - escitalopram (LEXAPRO) 10 MG tablet; Take 1 tablet (10 mg total) by mouth daily.  Dispense: 30 tablet; Refill: 0  4. At risk for osteoporosis Adriana Moon was given approximately 15 minutes of osteoporosis prevention counseling today. Adriana Moon is at risk for osteopenia and osteoporosis due to her Vitamin D deficiency. She was encouraged to take her Vitamin D and follow her higher calcium diet and increase strengthening exercise to help strengthen her bones and decrease her risk of osteopenia and osteoporosis.  Repetitive spaced learning was employed today to elicit superior memory formation and behavioral change.  5. Class 3 severe obesity with serious comorbidity and body mass index (BMI) of 45.0 to 49.9 in adult, unspecified obesity type (HCC) Adriana Moon is currently in the action stage of change. As such, her goal is to continue with weight loss efforts. She has agreed to the Category 1 Plan or keeping a food journal and adhering to recommended goals of 1000-1100 calories and 70-80+ grams of protein daily.   We discussed various medication options to help Adriana Moon with her  weight loss efforts and we both agreed to continue phentermine 37.5 mg PO daily and we will refill for 1 month.  - phentermine (ADIPEX-P) 37.5 MG tablet; Take 1 tablet (37.5 mg total) by mouth daily before breakfast.  Dispense: 30 tablet; Refill:  0  Exercise goals: All adults should avoid inactivity. Some physical activity is better than none, and adults who participate in any amount of physical activity gain some health benefits.  Behavioral modification strategies: increasing lean protein intake, increasing vegetables, meal planning and cooking strategies, keeping healthy foods in the home and planning for success.  Adriana Moon has agreed to follow-up with our clinic in 2 weeks. She was informed of the importance of frequent follow-up visits to maximize her success with intensive lifestyle modifications for her multiple health conditions.   Objective:   Blood pressure 109/73, pulse 85, temperature 98.4 F (36.9 C), temperature source Oral, height 5' (1.524 m), weight 242 lb (109.8 kg), SpO2 99 %. Body mass index is 47.26 kg/m.  General: Cooperative, alert, well developed, in no acute distress. HEENT: Conjunctivae and lids unremarkable. Cardiovascular: Regular rhythm.  Lungs: Normal work of breathing. Neurologic: No focal deficits.   Lab Results  Component Value Date   CREATININE 1.06 (H) 04/28/2019   BUN 16 04/28/2019   NA 142 04/28/2019   K 4.2 04/28/2019   CL 105 04/28/2019   CO2 26 04/28/2019   Lab Results  Component Value Date   ALT 7 04/28/2019   AST 7 04/28/2019   ALKPHOS 38 (L) 04/28/2019   BILITOT 0.5 04/28/2019   Lab Results  Component Value Date   HGBA1C 5.4 04/28/2019   HGBA1C 5.5 04/20/2016   Lab Results  Component Value Date   INSULIN 19.4 04/28/2019   Lab Results  Component Value Date   TSH 2.630 04/28/2019   Lab Results  Component Value Date   CHOL 170 04/28/2019   HDL 80 04/28/2019   LDLCALC 80 04/28/2019   TRIG 47 04/28/2019   CHOLHDL 2.3 05/08/2017   Lab Results  Component Value Date   WBC 7.5 04/28/2019   HGB 12.5 04/28/2019   HCT 37.8 04/28/2019   MCV 86 04/28/2019   PLT 338 04/28/2019   Lab Results  Component Value Date   IRON 82 04/28/2019   TIBC 315 04/28/2019    FERRITIN 153 (H) 04/28/2019   Attestation Statements:   Reviewed by clinician on day of visit: allergies, medications, problem list, medical history, surgical history, family history, social history, and previous encounter notes.   I, Trixie Dredge, am acting as transcriptionist for Coralie Common, MD.  I have reviewed the above documentation for accuracy and completeness, and I agree with the above. - Jinny Blossom, MD

## 2019-12-11 ENCOUNTER — Encounter: Payer: Self-pay | Admitting: Plastic Surgery

## 2019-12-11 ENCOUNTER — Ambulatory Visit: Payer: BC Managed Care – PPO | Admitting: Plastic Surgery

## 2019-12-11 ENCOUNTER — Other Ambulatory Visit: Payer: Self-pay

## 2019-12-11 VITALS — BP 116/78 | HR 76 | Temp 98.7°F

## 2019-12-11 DIAGNOSIS — Z9889 Other specified postprocedural states: Secondary | ICD-10-CM | POA: Diagnosis not present

## 2019-12-11 DIAGNOSIS — Z9013 Acquired absence of bilateral breasts and nipples: Secondary | ICD-10-CM

## 2019-12-11 NOTE — Progress Notes (Signed)
   Subjective:    Patient ID: Adriana Moon, female    DOB: [birth year], 39 y.o.   MRN: [id number]  The patient is a lovely 39 year old female here for follow-up on her breast reconstruction.  She had breast cancer and underwent bilateral mastectomies.  She has implants in place.  She had originally thought about doing Diep flaps but decided to keep the implants.  Overall she is doing really well and has adjusted very nicely.  Her incisions of all healed well.  There has been no significant weight change for her.  She has had significant relaxing of her skin flaps and the implants are nice and soft.  There is now loss of volume in the superior medial aspect of both breasts.  This is common and often seen at this point in time in the reconstructive process.  She would like to see if there is something that can be done to make it less noticeable.  Particularly when she wears certain shirts.     Review of Systems  Constitutional: Negative.   HENT: Negative.   Eyes: Negative.   Respiratory: Negative.   Cardiovascular: Negative.   Genitourinary: Negative.   Musculoskeletal: Negative.   Hematological: Negative.   Psychiatric/Behavioral: Negative.        Objective:   Physical Exam Vitals and nursing note reviewed.  Constitutional:      Appearance: Normal appearance.  HENT:     Head: Normocephalic and atraumatic.  Cardiovascular:     Rate and Rhythm: Normal rate.     Pulses: Normal pulses.  Pulmonary:     Effort: Pulmonary effort is normal.  Abdominal:     General: Abdomen is flat. There is no distension.  Neurological:     General: No focal deficit present.     Mental Status: She is alert.  Psychiatric:        Mood and Affect: Mood normal.        Behavior: Behavior normal.         Assessment & Plan:     ICD-10-CM   1. Acquired absence of both breasts  Z90.13   2. S/P breast reconstruction, bilateral  Z98.890     The patient is a good candidate and I  recommend bilateral breast reconstruction with lipofilling.  We talked about some of the limitations but she would be a good candidate with an improvement in her symmetry with lipofilling  Pictures were obtained of the patient and placed in the chart with the patient's or guardian's permission.

## 2019-12-15 ENCOUNTER — Other Ambulatory Visit: Payer: Self-pay

## 2019-12-15 ENCOUNTER — Ambulatory Visit (INDEPENDENT_AMBULATORY_CARE_PROVIDER_SITE_OTHER): Payer: BC Managed Care – PPO | Admitting: Family Medicine

## 2019-12-15 ENCOUNTER — Encounter (INDEPENDENT_AMBULATORY_CARE_PROVIDER_SITE_OTHER): Payer: Self-pay | Admitting: Family Medicine

## 2019-12-15 VITALS — BP 106/74 | HR 83 | Temp 98.0°F | Ht 60.0 in | Wt 233.0 lb

## 2019-12-15 DIAGNOSIS — E559 Vitamin D deficiency, unspecified: Secondary | ICD-10-CM | POA: Diagnosis not present

## 2019-12-15 DIAGNOSIS — Z6841 Body Mass Index (BMI) 40.0 and over, adult: Secondary | ICD-10-CM | POA: Diagnosis not present

## 2019-12-15 DIAGNOSIS — E8881 Metabolic syndrome: Secondary | ICD-10-CM

## 2019-12-15 NOTE — Progress Notes (Signed)
Chief Complaint:   OBESITY Adriana Moon is here to discuss her progress with her obesity treatment plan along with follow-up of her obesity related diagnoses. [name] is on keeping a food journal and adhering to recommended goals of 1000-1100 calories and 70-80+ grams of protein daily and states she is following her eating plan approximately 90% of the time. [name] states she is doing 0 minutes 0 times per week.  Today's visit was #: 12 Starting weight: 260 lbs Starting date: 04/28/2019 Today's weight: 233 lbs Today's date: 12/15/2019 Total lbs lost to date: 27 Total lbs lost since last in-office visit: 9  Interim History: Adriana Moon has been trying to stay away from anything red (like red meat). She is trying to get in a sandwich at lunch and controlled portion at dinner. She struggles to get all the protein in. She does realize she could get in more protein.  Subjective:   1. Insulin resistance [name] is on Victoza with minimal GI side effects. Last labs on 04/28/2019 showed an A1c of 5.4 and insulin 19.4.  2. Vitamin D deficiency [name] denies nausea, vomiting, or muscle weakness, but she notes fatigue. She is on prescription Vit D, and last Vit D level was 22.2.  Assessment/Plan:   1. Insulin resistance Adriana Moon will continue to work on weight loss, exercise, and decreasing simple carbohydrates to help decrease the risk of diabetes. Adriana Moon will continue her current dose of Victoza, and we will repeat abs at her next appointment. Adriana Moon agreed to follow-up with [name] as directed to closely monitor her progress.  2. Vitamin D deficiency Low Vitamin D level contributes to fatigue and are associated with obesity, breast, and colon cancer. Jen agreed to continue taking prescription Vitamin D 50,000 IU every week, no refill needed. She will follow-up for routine testing of Vitamin D, at least 2-3 times per year to avoid over-replacement.  3. Class 3 severe obesity with serious  comorbidity and body mass index (BMI) of 45.0 to 49.9 in adult, unspecified obesity type (HCC) Adriana Moon is currently in the action stage of change. As such, her goal is to continue with weight loss efforts. She has agreed to keeping a food journal and adhering to recommended goals of 1000-1100 calories and 70-80+ grams of protein daily.   Exercise goals: All adults should avoid inactivity. Some physical activity is better than none, and adults who participate in any amount of physical activity gain some health benefits.  Behavioral modification strategies: increasing lean protein intake, increasing vegetables, meal planning and cooking strategies, keeping healthy foods in the home and planning for success.  Adriana Moon has agreed to follow-up with our clinic in 2 to 3 weeks. She was informed of the importance of frequent follow-up visits to maximize her success with intensive lifestyle modifications for her multiple health conditions.   Objective:   Blood pressure 106/74, pulse 83, temperature 98 F (36.7 C), temperature source Oral, height 5' (1.524 m), weight 233 lb (105.7 kg), SpO2 99 %. Body mass index is 45.5 kg/m.  General: Cooperative, alert, well developed, in no acute distress. HEENT: Conjunctivae and lids unremarkable. Cardiovascular: Regular rhythm.  Lungs: Normal work of breathing. Neurologic: No focal deficits.   Lab Results  Component Value Date   CREATININE 1.06 (H) 04/28/2019   BUN 16 04/28/2019   NA 142 04/28/2019   K 4.2 04/28/2019   CL 105 04/28/2019   CO2 26 04/28/2019   Lab Results  Component Value Date   ALT 7 04/28/2019  AST 7 04/28/2019   ALKPHOS 38 (L) 04/28/2019   BILITOT 0.5 04/28/2019   Lab Results  Component Value Date   HGBA1C 5.4 04/28/2019   HGBA1C 5.5 04/20/2016   Lab Results  Component Value Date   INSULIN 19.4 04/28/2019   Lab Results  Component Value Date   TSH 2.630 04/28/2019   Lab Results  Component Value Date   CHOL 170  04/28/2019   HDL 80 04/28/2019   LDLCALC 80 04/28/2019   TRIG 47 04/28/2019   CHOLHDL 2.3 05/08/2017   Lab Results  Component Value Date   WBC 7.5 04/28/2019   HGB 12.5 04/28/2019   HCT 37.8 04/28/2019   MCV 86 04/28/2019   PLT 338 04/28/2019   Lab Results  Component Value Date   IRON 82 04/28/2019   TIBC 315 04/28/2019   FERRITIN 153 (H) 04/28/2019   Attestation Statements:   Reviewed by clinician on day of visit: allergies, medications, problem list, medical history, surgical history, family history, social history, and previous encounter notes.  Time spent on visit including pre-visit chart review and post-visit care and charting was 15 minutes.    I, Trixie Dredge, am acting as transcriptionist for Coralie Common, MD.  I have reviewed the above documentation for accuracy and completeness, and I agree with the above. - Jinny Blossom, MD

## 2019-12-24 ENCOUNTER — Ambulatory Visit: Payer: BC Managed Care – PPO | Admitting: Podiatry

## 2019-12-29 ENCOUNTER — Ambulatory Visit (INDEPENDENT_AMBULATORY_CARE_PROVIDER_SITE_OTHER): Payer: BC Managed Care – PPO | Admitting: Family Medicine

## 2019-12-29 ENCOUNTER — Ambulatory Visit: Payer: BC Managed Care – PPO | Admitting: Orthotics

## 2019-12-29 ENCOUNTER — Encounter (INDEPENDENT_AMBULATORY_CARE_PROVIDER_SITE_OTHER): Payer: Self-pay | Admitting: Family Medicine

## 2019-12-29 ENCOUNTER — Other Ambulatory Visit: Payer: Self-pay

## 2019-12-29 VITALS — BP 111/72 | HR 86 | Temp 98.1°F | Ht 60.0 in | Wt 241.0 lb

## 2019-12-29 DIAGNOSIS — Z6841 Body Mass Index (BMI) 40.0 and over, adult: Secondary | ICD-10-CM

## 2019-12-29 DIAGNOSIS — E8881 Metabolic syndrome: Secondary | ICD-10-CM

## 2019-12-29 DIAGNOSIS — E88819 Insulin resistance, unspecified: Secondary | ICD-10-CM

## 2019-12-29 DIAGNOSIS — Z9189 Other specified personal risk factors, not elsewhere classified: Secondary | ICD-10-CM

## 2019-12-29 DIAGNOSIS — M722 Plantar fascial fibromatosis: Secondary | ICD-10-CM

## 2019-12-29 DIAGNOSIS — E66813 Obesity, class 3: Secondary | ICD-10-CM

## 2019-12-29 MED ORDER — BD PEN NEEDLE NANO 2ND GEN 32G X 4 MM MISC: 1.0000 | Freq: Every day | 0 refills | Status: DC

## 2019-12-29 MED ORDER — PHENTERMINE HCL 37.5 MG PO TABS: 37.5000 mg | ORAL_TABLET | Freq: Every day | ORAL | 0 refills | Status: DC

## 2019-12-29 MED ORDER — VICTOZA 18 MG/3ML ~~LOC~~ SOPN: 1.2000 mg | PEN_INJECTOR | Freq: Every day | SUBCUTANEOUS | 0 refills | Status: DC

## 2019-12-29 NOTE — Progress Notes (Signed)
Patient came in today to p/up functional foot orthotics.   The orthotics were assessed to both fit and function.  The F/O addressed the biomechanical issues/pathologies as intended, offering good longitudinal arch support, proper offloading, and foot support. There weren't any signs of discomfort or irritation.  The F/O fit properly in footwear with minimal trimming/adjustments. 

## 2019-12-30 NOTE — Progress Notes (Signed)
Chief Complaint:   OBESITY Adriana Moon is here to discuss her progress with her obesity treatment plan along with follow-up of her obesity related diagnoses. Adriana Moon is on keeping a food journal and adhering to recommended goals of 1000-1100 calories and 70-80+ grams of protein daily and states she is following her eating plan approximately 60% of the time. Adriana Moon states she is doing 0 minutes 0 times per week.  Today's visit was #: 13 Starting weight: 260 lbs Starting date: 04/28/2019 Today's weight: 241 lbs Today's date: 12/29/2019 Total lbs lost to date: 19 Total lbs lost since last in-office visit: 0  Interim History: Adriana Moon voices she has tried to stick on the plan stringently but she voices that she is surprised with weight gain. She doesn't like eggs and she doesn't find the cereal to be convenient. She also has significant indigestion which limits her ability to eat certain foods.  Subjective:   1. Insulin resistance Adriana Moon still notes some carb cravings, and she denies GI side effects of Victoza.  2. Plantar fasciitis Adriana Moon's symptoms are uncontrolled. She feels just as bad today as she did prior to surgery. She is going to get orthotics.  3. At risk for activity intolerance Adriana Moon is at risk for exercise intolerance due to plantar fasciitis.  Assessment/Plan:   1. Insulin resistance Adriana Moon will continue to work on weight loss, exercise, and decreasing simple carbohydrates to help decrease the risk of diabetes. We will refill Victoza and pen needles for 1 month. Adriana Moon agreed to follow-up with [name] as directed to closely monitor her progress.  - Insulin Pen Needle (BD PEN NEEDLE NANO 2ND GEN) 32G X 4 MM MISC; 1 Package by Does not apply route daily.  Dispense: 100 each; Refill: 0 - liraglutide (VICTOZA) 18 MG/3ML SOPN; Inject 0.2 mLs (1.2 mg total) into the skin daily. Start 0.6mg  SQ once a day  Dispense: 6 mL; Refill: 0  2. Plantar fasciitis We will follow  up at [facility] next appointment.  3. At risk for activity intolerance Adriana Moon was given approximately 15 minutes of exercise intolerance counseling today. She is 39 y.o. female and has risk factors exercise intolerance including obesity. We discussed intensive lifestyle modifications today with an emphasis on specific weight loss instructions and strategies. Adriana Moon will slowly increase activity as tolerated.  Repetitive spaced learning was employed today to elicit superior memory formation and behavioral change.  4. Class 3 severe obesity with serious comorbidity and body mass index (BMI) of 45.0 to 49.9 in adult, unspecified obesity type (HCC) Adriana Moon is currently in the action stage of change. As such, her goal is to continue with weight loss efforts. She has agreed to keeping a food journal and adhering to recommended goals of 1000-1100 calories and 70+ grams of protein daily.   We discussed various medication options to help Adriana Moon with her weight loss efforts and we both agreed to continue phentermine, and we will refill for 1 month. I checked PDMP, no adverse filling activites.  - phentermine (ADIPEX-P) 37.5 MG tablet; Take 1 tablet (37.5 mg total) by mouth daily before breakfast.  Dispense: 30 tablet; Refill: 0  Exercise goals: No exercise has been prescribed at this time.  Behavioral modification strategies: increasing lean protein intake, meal planning and cooking strategies, keeping healthy foods in the home and planning for success.  Adriana Moon has agreed to follow-up with our clinic in 2 weeks. She was informed of the importance of frequent follow-up visits to maximize her success with intensive  lifestyle modifications for her multiple health conditions.   Objective:   Blood pressure 111/72, pulse 86, temperature 98.1 F (36.7 C), temperature source Oral, height 5' (1.524 m), weight 241 lb (109.3 kg), last menstrual period 12/15/2019, SpO2 97 %. Body mass index is 47.07  kg/m.  General: Cooperative, alert, well developed, in no acute distress. HEENT: Conjunctivae and lids unremarkable. Cardiovascular: Regular rhythm.  Lungs: Normal work of breathing. Neurologic: No focal deficits.   Lab Results  Component Value Date   CREATININE 1.06 (H) 04/28/2019   BUN 16 04/28/2019   NA 142 04/28/2019   K 4.2 04/28/2019   CL 105 04/28/2019   CO2 26 04/28/2019   Lab Results  Component Value Date   ALT 7 04/28/2019   AST 7 04/28/2019   ALKPHOS 38 (L) 04/28/2019   BILITOT 0.5 04/28/2019   Lab Results  Component Value Date   HGBA1C 5.4 04/28/2019   HGBA1C 5.5 04/20/2016   Lab Results  Component Value Date   INSULIN 19.4 04/28/2019   Lab Results  Component Value Date   TSH 2.630 04/28/2019   Lab Results  Component Value Date   CHOL 170 04/28/2019   HDL 80 04/28/2019   LDLCALC 80 04/28/2019   TRIG 47 04/28/2019   CHOLHDL 2.3 05/08/2017   Lab Results  Component Value Date   WBC 7.5 04/28/2019   HGB 12.5 04/28/2019   HCT 37.8 04/28/2019   MCV 86 04/28/2019   PLT 338 04/28/2019   Lab Results  Component Value Date   IRON 82 04/28/2019   TIBC 315 04/28/2019   FERRITIN 153 (H) 04/28/2019   Attestation Statements:   Reviewed by clinician on day of visit: allergies, medications, problem list, medical history, surgical history, family history, social history, and previous encounter notes.   I, Trixie Dredge, am acting as transcriptionist for Coralie Common, MD.  I have reviewed the above documentation for accuracy and completeness, and I agree with the above. - Jinny Blossom, MD

## 2020-01-06 ENCOUNTER — Ambulatory Visit (INDEPENDENT_AMBULATORY_CARE_PROVIDER_SITE_OTHER): Payer: BC Managed Care – PPO | Admitting: Podiatry

## 2020-01-06 ENCOUNTER — Other Ambulatory Visit: Payer: Self-pay

## 2020-01-06 DIAGNOSIS — M722 Plantar fascial fibromatosis: Secondary | ICD-10-CM

## 2020-01-06 DIAGNOSIS — Z9889 Other specified postprocedural states: Secondary | ICD-10-CM

## 2020-01-06 MED ORDER — MELOXICAM 15 MG PO TABS: 15.0000 mg | ORAL_TABLET | Freq: Every day | ORAL | 1 refills | Status: DC

## 2020-01-06 MED ORDER — METHYLPREDNISOLONE 4 MG PO TBPK
ORAL_TABLET | ORAL | 0 refills | Status: DC
Start: 2020-01-06 — End: 2020-02-18

## 2020-01-07 DIAGNOSIS — N926 Irregular menstruation, unspecified: Secondary | ICD-10-CM | POA: Diagnosis not present

## 2020-01-07 DIAGNOSIS — Z79810 Long term (current) use of selective estrogen receptor modulators (SERMs): Secondary | ICD-10-CM | POA: Diagnosis not present

## 2020-01-07 DIAGNOSIS — D251 Intramural leiomyoma of uterus: Secondary | ICD-10-CM | POA: Diagnosis not present

## 2020-01-07 DIAGNOSIS — Z5181 Encounter for therapeutic drug level monitoring: Secondary | ICD-10-CM | POA: Diagnosis not present

## 2020-01-07 DIAGNOSIS — N939 Abnormal uterine and vaginal bleeding, unspecified: Secondary | ICD-10-CM | POA: Diagnosis not present

## 2020-01-07 DIAGNOSIS — N898 Other specified noninflammatory disorders of vagina: Secondary | ICD-10-CM | POA: Diagnosis not present

## 2020-01-07 DIAGNOSIS — N854 Malposition of uterus: Secondary | ICD-10-CM | POA: Diagnosis not present

## 2020-01-11 NOTE — Progress Notes (Signed)
   Subjective:  Patient presents today status post EPF bilateral. DOS: 10/22/2019.  Patient states as she has returned to work the heels of become increasingly painful.  She has been in major pain since working back in [employer] where she works at.  She has been wearing her custom molded orthotics as directed.  She presents for further treatment and evaluation  Past Medical History:  Diagnosis Date  . Angio-edema   . Anxiety    manages naturally  . Arthritis of both knees   . Asthma   . Bilateral swelling of feet   . Breast cancer ([place])   . Breast disorder    cancer  . Depression    pt. reports that she tends to get down & has had Lexapro in the past but its been taken off her list for now.   . Family history of breast cancer   . Family history of kidney cancer   . Family history of lung cancer   . Family history of prostate cancer   . GERD (gastroesophageal reflux disease)   . History of kidney problems   . IBS (irritable bowel syndrome)   . Joint pain   . Lactose intolerance   . Migraines   . PCOS (polycystic ovarian syndrome)   . Urticaria   . Varicose veins of legs      Objective: Physical Exam General: The patient is alert and oriented x3 in no acute distress.  Dermatology: Skin is cool, dry and supple bilateral lower extremities. Negative for open lesions or macerations.  Vascular: Palpable pedal pulses bilaterally. No edema or erythema noted. Capillary refill within normal limits.  Neurological: Epicritic and protective threshold grossly intact bilaterally.   Musculoskeletal Exam: All pedal and ankle joints range of motion within normal limits bilateral. Muscle strength 5/5 in all groups bilateral.  Significant pain on palpation to the plantar fascia at the insertion of the medial calcaneal tubercle bilateral    Assessment: 1. s/p EPF bilateral. DOS: 10/22/2019 2.  Recurrent acute plantar fasciitis bilateral  Plan of Care:  1. Patient was evaluated.    2.  Continue wearing [place] shoes daily with custom molded orthotics 3.  Injection of 0.5 cc Celestone Soluspan injected into the bilateral plantar fascia bilateral heels 4.  Prescription for Medrol Dosepak 5.  Prescription for meloxicam to take after completion of the Dosepak.  Patient states that she is allergic to NSAIDs, however the only and states that she has had a reaction to his ibuprofen and Aleve.  Recommend starting with half a pill to see if she develops an allergic reaction to the meloxicam 6.  Return to clinic in 4 weeks  *Works at [employer], DPM [facility]  Dr. Edrick Kins, [address]                                        [place], [place] [address]                Office [phone]  Fax [phone]

## 2020-01-24 DIAGNOSIS — Z6841 Body Mass Index (BMI) 40.0 and over, adult: Secondary | ICD-10-CM | POA: Diagnosis not present

## 2020-01-24 DIAGNOSIS — J0100 Acute maxillary sinusitis, unspecified: Secondary | ICD-10-CM | POA: Diagnosis not present

## 2020-01-25 ENCOUNTER — Encounter: Payer: BC Managed Care – PPO | Admitting: Podiatry

## 2020-01-27 ENCOUNTER — Encounter (INDEPENDENT_AMBULATORY_CARE_PROVIDER_SITE_OTHER): Payer: Self-pay | Admitting: Family Medicine

## 2020-01-27 ENCOUNTER — Other Ambulatory Visit: Payer: Self-pay

## 2020-01-27 ENCOUNTER — Ambulatory Visit (INDEPENDENT_AMBULATORY_CARE_PROVIDER_SITE_OTHER): Payer: BC Managed Care – PPO | Admitting: Family Medicine

## 2020-01-27 VITALS — BP 112/78 | HR 75 | Temp 97.8°F | Ht 60.0 in | Wt 231.0 lb

## 2020-01-27 DIAGNOSIS — E8881 Metabolic syndrome: Secondary | ICD-10-CM | POA: Diagnosis not present

## 2020-01-27 DIAGNOSIS — Z6841 Body Mass Index (BMI) 40.0 and over, adult: Secondary | ICD-10-CM

## 2020-01-27 DIAGNOSIS — F3289 Other specified depressive episodes: Secondary | ICD-10-CM | POA: Diagnosis not present

## 2020-01-27 DIAGNOSIS — E88819 Insulin resistance, unspecified: Secondary | ICD-10-CM

## 2020-01-27 DIAGNOSIS — Z9189 Other specified personal risk factors, not elsewhere classified: Secondary | ICD-10-CM | POA: Diagnosis not present

## 2020-01-27 DIAGNOSIS — E66813 Obesity, class 3: Secondary | ICD-10-CM

## 2020-01-27 MED ORDER — METFORMIN HCL 500 MG PO TABS: 500.0000 mg | ORAL_TABLET | Freq: Two times a day (BID) | ORAL | 0 refills | Status: DC

## 2020-01-27 MED ORDER — ESCITALOPRAM OXALATE 10 MG PO TABS: 10.0000 mg | ORAL_TABLET | Freq: Every day | ORAL | 0 refills | Status: DC

## 2020-01-27 NOTE — Progress Notes (Signed)
Chief Complaint:   OBESITY Adriana Moon is here to discuss her progress with her obesity treatment plan along with follow-up of her obesity related diagnoses. Adriana Moon is on keeping a food journal and adhering to recommended goals of 1000-1100 calories and 70+ grams of protein daily and states she is following her eating plan approximately 75% of the time. Adriana Moon states she is doing 0 minutes 0 times per week.  Today's visit was #: 14 Starting weight: 260 lbs Starting date: 04/28/2019 Today's weight: 231 lbs Today's date: 01/27/2020 Total lbs lost to date: 29 Total lbs lost since last in-office visit: 10  Interim History: Adriana Moon is now able to walk with less pain. She voices her Lifestyle isn't setup for journaling. She has been eating a ham sandwich, 3 pieces of fruit and then something like a baked potato for dinner. She is enjoying eating less meat. She is interested in drinking protein water.  Subjective:   1. Insulin resistance Adriana Moon denies GI side effects of metformin. Last labs were in January 2021.  2. Other depression, with emotional eating Adriana Moon denies suicidal ideas or homicidal ideas. She is on Lexapro with improvement in symptoms.  3. At risk for activity intolerance Adriana Moon is at risk for exercise intolerance secondary to recurrent plantar fascitis, status post bilateral foot surgery.  Assessment/Plan:   1. Insulin resistance Adriana Moon will continue to work on weight loss, exercise, and decreasing simple carbohydrates to help decrease the risk of diabetes. We will refill metformin for 1 month. Adriana Moon agreed to follow-up with [name] as directed to closely monitor her progress.  - metFORMIN (GLUCOPHAGE) 500 MG tablet; Take 1 tablet (500 mg total) by mouth 2 (two) times daily.  Dispense: 60 tablet; Refill: 0  2. Other depression, with emotional eating Behavior modification techniques were discussed today to help Adriana Moon deal with her emotional/non-hunger eating  behaviors. We will refill Lexapro for 1 month. Orders and follow up as documented in patient record.   - escitalopram (LEXAPRO) 10 MG tablet; Take 1 tablet (10 mg total) by mouth daily.  Dispense: 30 tablet; Refill: 0  3. At risk for activity intolerance Adriana Moon was given approximately 15 minutes of exercise intolerance counseling today. She is 39 y.o. female and has risk factors exercise intolerance including obesity. We discussed intensive lifestyle modifications today with an emphasis on specific weight loss instructions and strategies. Adriana Moon will slowly increase activity as tolerated.  Repetitive spaced learning was employed today to elicit superior memory formation and behavioral change.  4. Class 3 severe obesity with serious comorbidity and body mass index (BMI) of 45.0 to 49.9 in adult, unspecified obesity type (HCC) Adriana Moon is currently in the action stage of change. As such, her goal is to continue with weight loss efforts. She has agreed to keeping a food journal and adhering to recommended goals of 1000-1100 calories and 70-80 grams of protein daily.   Exercise goals: No exercise has been prescribed at this time.  Behavioral modification strategies: increasing lean protein intake, meal planning and cooking strategies, keeping healthy foods in the home and planning for success.  Adriana Moon has agreed to follow-up with our clinic in 3 weeks. She was informed of the importance of frequent follow-up visits to maximize her success with intensive lifestyle modifications for her multiple health conditions.   Objective:   Blood pressure 112/78, pulse 75, temperature 97.8 F (36.6 C), temperature source Oral, height 5' (1.524 m), weight 231 lb (104.8 kg), SpO2 97 %. Body mass index is 45.Adriana Moon  kg/m.  General: Cooperative, alert, well developed, in no acute distress. HEENT: Conjunctivae and lids unremarkable. Cardiovascular: Regular rhythm.  Lungs: Normal work of breathing. Neurologic:  No focal deficits.   Lab Results  Component Value Date   CREATININE 1.06 (H) 04/28/2019   BUN 16 04/28/2019   NA 142 04/28/2019   K 4.2 04/28/2019   CL 105 04/28/2019   CO2 26 04/28/2019   Lab Results  Component Value Date   ALT 7 04/28/2019   AST 7 04/28/2019   ALKPHOS 38 (L) 04/28/2019   BILITOT 0.5 04/28/2019   Lab Results  Component Value Date   HGBA1C 5.4 04/28/2019   HGBA1C 5.5 04/20/2016   Lab Results  Component Value Date   INSULIN 19.4 04/28/2019   Lab Results  Component Value Date   TSH 2.630 04/28/2019   Lab Results  Component Value Date   CHOL 170 04/28/2019   HDL 80 04/28/2019   LDLCALC 80 04/28/2019   TRIG 47 04/28/2019   CHOLHDL 2.3 05/08/2017   Lab Results  Component Value Date   WBC 7.5 04/28/2019   HGB 12.5 04/28/2019   HCT 37.8 04/28/2019   MCV 86 04/28/2019   PLT 338 04/28/2019   Lab Results  Component Value Date   IRON 82 04/28/2019   TIBC 315 04/28/2019   FERRITIN 153 (H) 04/28/2019   Attestation Statements:   Reviewed by clinician on day of visit: allergies, medications, problem list, medical history, surgical history, family history, social history, and previous encounter notes.   I, Trixie Dredge, am acting as transcriptionist for Coralie Common, MD.  I have reviewed the above documentation for accuracy and completeness, and I agree with the above. - Jinny Blossom, MD

## 2020-02-05 ENCOUNTER — Other Ambulatory Visit: Payer: Self-pay | Admitting: Surgical

## 2020-02-05 DIAGNOSIS — Z170 Estrogen receptor positive status [ER+]: Secondary | ICD-10-CM

## 2020-02-08 DIAGNOSIS — N939 Abnormal uterine and vaginal bleeding, unspecified: Secondary | ICD-10-CM | POA: Diagnosis not present

## 2020-02-08 DIAGNOSIS — N926 Irregular menstruation, unspecified: Secondary | ICD-10-CM | POA: Diagnosis not present

## 2020-02-08 DIAGNOSIS — N854 Malposition of uterus: Secondary | ICD-10-CM | POA: Diagnosis not present

## 2020-02-08 DIAGNOSIS — Z5181 Encounter for therapeutic drug level monitoring: Secondary | ICD-10-CM | POA: Diagnosis not present

## 2020-02-08 DIAGNOSIS — Z79810 Long term (current) use of selective estrogen receptor modulators (SERMs): Secondary | ICD-10-CM | POA: Diagnosis not present

## 2020-02-08 DIAGNOSIS — D251 Intramural leiomyoma of uterus: Secondary | ICD-10-CM | POA: Diagnosis not present

## 2020-02-17 ENCOUNTER — Ambulatory Visit (INDEPENDENT_AMBULATORY_CARE_PROVIDER_SITE_OTHER): Payer: BC Managed Care – PPO | Admitting: Family Medicine

## 2020-02-18 ENCOUNTER — Ambulatory Visit: Payer: BC Managed Care – PPO | Admitting: Gastroenterology

## 2020-02-18 ENCOUNTER — Encounter: Payer: Self-pay | Admitting: Gastroenterology

## 2020-02-18 ENCOUNTER — Other Ambulatory Visit (INDEPENDENT_AMBULATORY_CARE_PROVIDER_SITE_OTHER): Payer: BC Managed Care – PPO

## 2020-02-18 VITALS — BP 128/78 | HR 92 | Ht 59.5 in | Wt 239.2 lb

## 2020-02-18 DIAGNOSIS — K219 Gastro-esophageal reflux disease without esophagitis: Secondary | ICD-10-CM

## 2020-02-18 DIAGNOSIS — R1013 Epigastric pain: Secondary | ICD-10-CM

## 2020-02-18 DIAGNOSIS — R1310 Dysphagia, unspecified: Secondary | ICD-10-CM

## 2020-02-18 LAB — BASIC METABOLIC PANEL
BUN: 12 mg/dL (ref 6–23)
CO2: 27 mEq/L (ref 19–32)
Calcium: 9.1 mg/dL (ref 8.4–10.5)
Chloride: 107 mEq/L (ref 96–112)
Creatinine, Ser: 1.18 mg/dL (ref 0.40–1.20)
GFR: 58.83 mL/min — ABNORMAL LOW (ref >60.00)
Glucose, Bld: 91 mg/dL (ref 70–99)
Potassium: 3.5 mEq/L (ref 3.5–5.1)
Sodium: 141 mEq/L (ref 135–145)

## 2020-02-18 MED ORDER — DEXLANSOPRAZOLE 60 MG PO CPDR: 60.0000 mg | DELAYED_RELEASE_CAPSULE | Freq: Every day | ORAL | 0 refills | Status: DC

## 2020-02-18 NOTE — Patient Instructions (Addendum)
If you are age 38 or older, your body mass index should be between 23-30. Your Body mass index is 47.51 kg/m. If this is out of the aforementioned range listed, please consider follow up with your Primary Care Provider.  If you are age 38 or younger, your body mass index should be between 19-25. Your Body mass index is 47.51 kg/m. If this is out of the aformentioned range listed, please consider follow up with your Primary Care Provider.   You have been scheduled for an endoscopy. Please follow written instructions given to you at your visit today. If you use inhalers (even only as needed), please bring them with you on the day of your procedure.   PLEASE HOLD YOUR PHENTERMINE FOR 10 DAYS PRIOR TO YOUR PROCEDURE.  We are giving you samples of Dexilant 60 mg today. Take once daily.   Please go to the lab in the basement of our building to have lab work done as you leave today. Hit "B" for basement when you get on the elevator.  When the doors open the lab is on your left.  We will call you with the results. Thank you.  Due to recent changes in healthcare laws, you may see the results of your imaging and laboratory studies on MyChart before your provider has had a chance to review them.  We understand that in some cases there may be results that are confusing or concerning to you. Not all laboratory results come back in the same time frame and the provider may be waiting for multiple results in order to interpret others.  Please give [name] 48 hours in order for your provider to thoroughly review all the results before contacting the office for clarification of your results.    Thank you for entrusting me with your care and for choosing [facility], [address]     .

## 2020-02-18 NOTE — Progress Notes (Signed)
HPI :  39 year old female with a history of breast cancer status post bilateral mastectomy, GERD, reported history of stage I CKD, referred by Briscoe Deutscher, DO for evaluation of reflux, dysphagia, other upper tract symptoms.  She states she has had symptoms of GERD for years.  She endorses pyrosis with regurgitation has been longstanding.  Sounds like she has been on a variety of PPIs over the years.  She has been on AcipHex, Protonix, omeprazole, and Nexium.  She states Protonix had worked for her however she developed some mild worsening of her renal function was taken off of that and transition to omeprazole.  Her creatinine appears normal on her last 2 lab draws, the last one was Jan 2021 her Cr was 1.06.  She states she has been taking omeprazole 40 mg once a day but it does not control her reflux symptoms very well.  She has a lot of breakthrough pyrosis and regurgitation.  In recent months she is also developed some dysphagia.  She states she goes through " episodes" where she has difficulty swallowing both solids and liquids.  She states this can feel like it gets stuck at the bottom of her chest before it goes through.  She denies any history of impactions or vomiting when this happens.  She states particular types of crackers can often get stuck there.  She tried some Nexium over-the-counter recently and states it worked a little bit better than the omeprazole had for her.  She has intermittent nausea but no vomiting.  She does have some epigastric pain with the symptoms that bothers her as well.  She states that eating hot foods can make her symptoms worse.  She has bad acidic taste in her mouth as well as symptoms of globus.  She was recently given a prescription for Mobic daily for plantar fasciitis but she just started taking that, these upper tract symptoms have been going on well before she started that.  She has Dilaudid written as an as needed pain medication for plantar fasciitis, she  states she took that after she had surgery on her foot, she does not really take that at all anymore.  She does have a history of morbid obesity, has been on phentermine recently for this and has had a good result of about 30 pounds of weight loss.  She does not think weight loss has helped the reflux symptoms or dysphagia so far.  She has never had a prior EGD before.  Denies any family history of esophageal cancer or colon cancer.  Denies any problems with her bowels, no blood in her stools.    Past Medical History:  Diagnosis Date  . Angio-edema   . Anxiety    manages naturally  . Arthritis of both knees   . Asthma   . Bilateral swelling of feet   . Breast cancer ([place])    twice  . Depression    pt. reports that she tends to get down & has had Lexapro in the past but its been taken off her list for now.   . Family history of breast cancer   . Family history of kidney cancer   . Family history of lung cancer   . Family history of prostate cancer   . GERD (gastroesophageal reflux disease)   . IBS (irritable bowel syndrome)   . Insulin resistance   . Joint pain   . Kidney failure    stage 1  . Lactose intolerance   .  Migraines   . PCOS (polycystic ovarian syndrome)   . PCOS (polycystic ovarian syndrome)   . Urticaria   . Varicose veins of legs      Past Surgical History:  Procedure Laterality Date  . BREAST LUMPECTOMY Right 2009  . BREAST RECONSTRUCTION WITH PLACEMENT OF TISSUE EXPANDER AND FLEX HD (ACELLULAR HYDRATED DERMIS) Bilateral 12/22/2018   Procedure: BILATERAL BREAST RECONSTRUCTION WITH PLACEMENT OF TISSUE EXPANDER AND FLEX HD (ACELLULAR HYDRATED DERMIS);  Surgeon: Wallace Going, DO;  Location: [facility];  Service: Plastics;  Laterality: Bilateral;  . BREAST REDUCTION SURGERY Bilateral 01/25/2015   Procedure: MAMMARY REDUCTION  (BREAST);  Surgeon: [name], MD;  Location: [facility];  Service: Plastics;  Laterality: Bilateral;  .  CESAREAN SECTION     x2  2002 & 2006  . ECTOPIC PREGNANCY SURGERY  06/2018   R side , done at [facility]    . MASTECTOMY W/ SENTINEL NODE BIOPSY Bilateral 12/22/2018   with tissue expanders  . MASTECTOMY W/ SENTINEL NODE BIOPSY Bilateral 12/22/2018   Procedure: BILATERAL MASTECTOMIES WITH RIGHT SENTINEL LYMPH NODE BIOPSY;  Surgeon: Coralie Keens, MD;  Location: [facility];  Service: General;  Laterality: Bilateral;  . PLANTAR FASCIA SURGERY Bilateral   . REDUCTION MAMMAPLASTY Left   . REMOVAL OF BILATERAL TISSUE EXPANDERS WITH PLACEMENT OF BILATERAL BREAST IMPLANTS Bilateral 04/06/2019   Procedure: REMOVAL OF BILATERAL TISSUE EXPANDERS WITH PLACEMENT OF BILATERAL BREAST IMPLANTS;  Surgeon: Wallace Going, DO;  Location: [facility];  Service: Plastics;  Laterality: Bilateral;  . RT lumpectomy     Family History  Problem Relation Age of Onset  . Diabetes Father   . Hypertension Father   . Gout Father   . Obesity Father   . Sleep apnea Father   . Colon polyps Father   . Diverticulitis Father   . Breast cancer Paternal Grandmother        late 41s  . Emphysema Maternal Grandfather   . Lung cancer Maternal Grandfather        72s  . Diabetes Maternal Aunt   . Kidney disease Maternal Aunt   . Prostate cancer Maternal Uncle        late 39s  . Diabetes Maternal Grandmother   . Kidney disease Maternal Grandmother   . Kidney cancer Other 86       4th degree paternal relative   Social History   Tobacco Use  . Smoking status: Never Smoker  . Smokeless tobacco: Never Used  Vaping Use  . Vaping Use: Never used  Substance Use Topics  . Alcohol use: Yes    Alcohol/week: 0.0 standard drinks    Comment: occ. - socially  . Drug use: No   Current Outpatient Medications  Medication Sig Dispense Refill  . acetaminophen (TYLENOL) 500 MG tablet Take 1,000 mg by mouth every 6 (six) hours as needed for moderate pain or headache.    . albuterol (VENTOLIN HFA) 108  (90 Base) MCG/ACT inhaler Inhale 2 puffs into the lungs every 6 (six) hours as needed for wheezing or shortness of breath. 18 g 11  . APPLE CIDER VINEGAR PO Take 1 capsule by mouth 3 (three) times daily.    . cetirizine (ZYRTEC) 10 MG tablet Take 1-2 tablets 1-2 times a day as needed (Patient taking differently: Take 10 mg by mouth every evening. ) 180 tablet 3  . cyclobenzaprine (FLEXERIL) 10 MG tablet Take 1 tablet (10 mg total) by  mouth 2 (two) times daily. 30 tablet 0  . diazepam (VALIUM) 2 MG tablet Take 1 tablet (2 mg total) by mouth every 6 (six) hours as needed for anxiety. 30 tablet 0  . escitalopram (LEXAPRO) 10 MG tablet Take 1 tablet (10 mg total) by mouth daily. 30 tablet 0  . fluconazole (DIFLUCAN) 150 MG tablet Take 1 tablet (150 mg total) by mouth every 30 (thirty) days. every month before period 6 tablet 2  . fluticasone (FLONASE) 50 MCG/ACT nasal spray Place 2 sprays into both nostrils daily. 16 g 6  . fluticasone furoate-vilanterol (BREO ELLIPTA) 100-25 MCG/INH AEPB Inhale 1 puff into the lungs daily. 60 each 11  . HYDROmorphone (DILAUDID) 4 MG tablet Take 1 tablet (4 mg total) by mouth every 4 (four) hours as needed for severe pain. 30 tablet 0  . hydrOXYzine (ATARAX/VISTARIL) 25 MG tablet Take 1 tablet (25 mg total) by mouth 3 (three) times daily as needed for anxiety or itching. 30 tablet 11  . Insulin Pen Needle (BD PEN NEEDLE NANO 2ND GEN) 32G X 4 MM MISC 1 Package by Does not apply route daily. 100 each 0  . liraglutide (VICTOZA) 18 MG/3ML SOPN Inject 0.2 mLs (1.2 mg total) into the skin daily. Start 0.6mg  SQ once a day 6 mL 0  . meloxicam (MOBIC) 15 MG tablet Take 1 tablet (15 mg total) by mouth daily. 30 tablet 1  . metFORMIN (GLUCOPHAGE) 500 MG tablet Take 1 tablet (500 mg total) by mouth 2 (two) times daily. 60 tablet 0  . Multiple Vitamin (MULTIVITAMIN WITH MINERALS) TABS tablet Take 1 tablet by mouth daily.    Marland Kitchen omeprazole (PRILOSEC) 40 MG capsule Take 1 capsule (40 mg  total) by mouth at bedtime. TAKE 1 CAPSULE BY MOUTH ONCE DAILY** NEEDS  TO BE SEEN  BEFORE  NEXT  REFILL** 90 capsule 3  . ondansetron (ZOFRAN) 4 MG tablet Take 1 tablet (4 mg total) by mouth every 8 (eight) hours as needed for nausea or vomiting. 20 tablet 0  . phentermine (ADIPEX-P) 37.5 MG tablet Take 1 tablet (37.5 mg total) by mouth daily before breakfast. 30 tablet 0  . Spacer/Aero Chamber Mouthpiece MISC 1 each by Does not apply route every 6 (six) hours as needed. 1 each 0  . tamoxifen (NOLVADEX) 20 MG tablet Take 1 tablet (20 mg total) by mouth daily. (Patient taking differently: Take 20 mg by mouth at bedtime. ) 90 tablet 3  . Vitamin D, Ergocalciferol, (DRISDOL) 1.25 MG (50000 UNIT) CAPS capsule Take 1 capsule (50,000 Units total) by mouth every 7 (seven) days. 4 capsule 0   No current facility-administered medications for this visit.   Allergies  Allergen Reactions  . Prednisone     Lost taste in her mouth, also makes "me crazy & mean"   . Tolmetin Hives and Swelling  . Nsaids Hives and Swelling  . Percocet [Oxycodone-Acetaminophen] Nausea Only    Very lightheaded.  . Shellfish Allergy Hives and Swelling     Review of Systems: All systems reviewed and negative except where noted in HPI.   Lab Results  Component Value Date   WBC 7.5 04/28/2019   HGB 12.5 04/28/2019   HCT 37.8 04/28/2019   MCV 86 04/28/2019   PLT 338 04/28/2019    Lab Results  Component Value Date   CREATININE 1.18 02/18/2020   BUN 12 02/18/2020   NA 141 02/18/2020   K 3.5 02/18/2020   CL 107 02/18/2020   CO2  27 02/18/2020    Lab Results  Component Value Date   ALT 7 04/28/2019   AST 7 04/28/2019   ALKPHOS 38 (L) 04/28/2019   BILITOT 0.5 04/28/2019     Physical Exam: BP 128/78 (BP Location: Left Arm, Patient Position: Sitting, Cuff Size: Normal)   Pulse 92   Ht 4' 11.5" (1.511 m) Comment: height measured without shoes  Wt 239 lb 4 oz (108.5 kg)   BMI 47.51 kg/m  Constitutional:  Pleasant,well-developed, female in no acute distress. HEENT: Normocephalic and atraumatic. Conjunctivae are normal. No scleral icterus. Neck supple.  Cardiovascular: Normal rate, regular rhythm.  Pulmonary/chest: Effort normal and breath sounds normal.  Abdominal: Soft, nondistended, nontender.  There are no masses palpable.  Extremities: no edema Lymphadenopathy: No cervical adenopathy noted. Neurological: Alert and oriented to person place and time. Skin: Skin is warm and dry. No rashes noted. Psychiatric: Normal mood and affect. Behavior is normal.   ASSESSMENT AND PLAN: 39 year old female here for new patient consultation regarding the following:  GERD / Dysphagia / epigastric pain / nausea - she has had difficult to control reflux historically, on omeprazole 40 mg once a day she has persistent symptoms of bother her routinely.  Now with progressive dysphagia and occasional epigastric pain and nausea.  I discussed the situation with her.  I do think an EGD would be beneficial here to assess for peptic stricture, damage from reflux, rule out large hiatal hernia in regards to her refractory symptoms, etc.  Also rule out EOE.  I discussed EGD with her including risk benefits of the exam and that of anesthesia and she wants to proceed.  She does take phentermine, she will need to hold this for 10 days prior to receiving anesthesia, she agreed with this.  Otherwise she does endorse a history of rising creatinine on Protonix in the past.  Her last creatinine in January 2021 was normal.  She has been on chronic omeprazole since then.  I counseled her on the long-term risks and benefits of chronic PPI use, and risks of CKD is possible however would think usually a class effect.  I will check her renal function today to ensure stable since it was last checked.  Pending that is stable, I will give her a free sample of Dexilant 60 mg once daily for 2 weeks to see if that works better than omeprazole in the  interim.  If it does help she should let me know and we can write her a prescription for this.  Otherwise hopefully her creatinine is normal but if not we will need to discuss holding PPI and considering other options.  Unfortunately she is not a good candidate for TIF for reflux surgery given her body mass index. Encouraged her to continue to work on weight loss as hopefully that will help with time.  Will await her course and EGD.  Chugcreek Cellar, MD [facility]  CC: Briscoe Deutscher, DO

## 2020-02-22 ENCOUNTER — Other Ambulatory Visit: Payer: Self-pay | Admitting: Hematology and Oncology

## 2020-02-23 ENCOUNTER — Ambulatory Visit: Payer: BC Managed Care – PPO | Admitting: Orthotics

## 2020-02-23 ENCOUNTER — Other Ambulatory Visit: Payer: Self-pay

## 2020-02-23 DIAGNOSIS — M722 Plantar fascial fibromatosis: Secondary | ICD-10-CM

## 2020-02-23 NOTE — Progress Notes (Signed)
ADD  Heel punch and h/s cushion'  Remove lateral ff wedge.

## 2020-03-02 ENCOUNTER — Ambulatory Visit: Payer: BC Managed Care – PPO | Admitting: Podiatry

## 2020-03-03 ENCOUNTER — Encounter: Payer: Self-pay | Admitting: Gastroenterology

## 2020-03-03 ENCOUNTER — Other Ambulatory Visit: Payer: Self-pay

## 2020-03-03 ENCOUNTER — Ambulatory Visit (AMBULATORY_SURGERY_CENTER): Payer: BC Managed Care – PPO | Admitting: Gastroenterology

## 2020-03-03 VITALS — BP 154/90 | HR 83 | Temp 97.5°F | Resp 16 | Ht 59.0 in | Wt 239.0 lb

## 2020-03-03 DIAGNOSIS — K319 Disease of stomach and duodenum, unspecified: Secondary | ICD-10-CM

## 2020-03-03 DIAGNOSIS — K449 Diaphragmatic hernia without obstruction or gangrene: Secondary | ICD-10-CM | POA: Diagnosis not present

## 2020-03-03 DIAGNOSIS — R1310 Dysphagia, unspecified: Secondary | ICD-10-CM

## 2020-03-03 DIAGNOSIS — K225 Diverticulum of esophagus, acquired: Secondary | ICD-10-CM | POA: Diagnosis not present

## 2020-03-03 DIAGNOSIS — K2289 Other specified disease of esophagus: Secondary | ICD-10-CM | POA: Diagnosis not present

## 2020-03-03 DIAGNOSIS — K219 Gastro-esophageal reflux disease without esophagitis: Secondary | ICD-10-CM

## 2020-03-03 DIAGNOSIS — R109 Unspecified abdominal pain: Secondary | ICD-10-CM

## 2020-03-03 DIAGNOSIS — R110 Nausea: Secondary | ICD-10-CM

## 2020-03-03 DIAGNOSIS — K3189 Other diseases of stomach and duodenum: Secondary | ICD-10-CM | POA: Diagnosis not present

## 2020-03-03 MED ORDER — DEXLANSOPRAZOLE 60 MG PO CPDR: 60.0000 mg | DELAYED_RELEASE_CAPSULE | Freq: Every day | ORAL | 3 refills | Status: DC

## 2020-03-03 MED ORDER — SODIUM CHLORIDE 0.9 % IV SOLN: 500.0000 mL | Freq: Once | INTRAVENOUS | Status: DC

## 2020-03-03 NOTE — Progress Notes (Signed)
Pt Drowsy. VSS. To PACU, report to RN. No anesthetic complications noted.  

## 2020-03-03 NOTE — Progress Notes (Signed)
Lidocaine 100mg IV given to blunt gag reflex 

## 2020-03-03 NOTE — Progress Notes (Signed)
Vitals-CW  History reviewed. 

## 2020-03-03 NOTE — Op Note (Addendum)
[facility] Patient Name: Adriana Moon Procedure Date: 03/03/2020 11:36 AM MRN: [id number] Endoscopist: Remo Lipps P. Havery Moros , MD Age: 39 Referring MD:  Date of Birth: 08/16/1980 Gender: Female Account #: [id number] Procedure:                Upper GI endoscopy Indications:              Dysphagia, history of persistent gastro-esophageal                            reflux disease, Nausea, epigastric pain - failed                            multiple PPIs, now on Dexilant with significant                            improvement in symptoms, dysphagia resolved. Medicines:                Monitored Anesthesia Care Procedure:                Pre-Anesthesia Assessment:                           - Prior to the procedure, a History and Physical                            was performed, and patient medications and                            allergies were reviewed. The patient's tolerance of                            previous anesthesia was also reviewed. The risks                            and benefits of the procedure and the sedation                            options and risks were discussed with the patient.                            All questions were answered, and informed consent                            was obtained. Prior Anticoagulants: The patient has                            taken no previous anticoagulant or antiplatelet                            agents. ASA Grade Assessment: III - A patient with                            severe systemic disease. After reviewing the risks  and benefits, the patient was deemed in                            satisfactory condition to undergo the procedure.                           After obtaining informed consent, the endoscope was                            passed under direct vision. Throughout the                            procedure, the patient's blood pressure, pulse, and                             oxygen saturations were monitored continuously. The                            Endoscope was introduced through the mouth, and                            advanced to the second part of duodenum. The upper                            GI endoscopy was accomplished without difficulty.                            The patient tolerated the procedure well. Scope In: Scope Out: Findings:                 Esophagogastric landmarks were identified: the                            Z-line was found at 35 cm, the gastroesophageal                            junction was found at 35 cm and the upper extent of                            the gastric folds was found at 36 cm from the                            incisors.                           A 1 cm hiatal hernia was present.                           The exam of the esophagus was otherwise normal. No                            focal stenosis / stricture appreciated. No empiric  dilation performed given dysphagia resolved.                           Biopsies were taken with a cold forceps in the                            upper third of the esophagus, in the middle third                            of the esophagus and in the lower third of the                            esophagus for histology, rule out eosinophilic                            esophagitis.                           The stomach was J shaped - normal variant. The                            entire examined stomach was otherwise normal.                            Biopsies were taken with a cold forceps for                            Helicobacter pylori testing.                           A small diverticulum was found in the second                            portion of the duodenum.                           The exam of the duodenum was otherwise normal. Complications:            No immediate complications. Estimated blood loss:                             Minimal. Estimated Blood Loss:     Estimated blood loss was minimal. Impression:               - Esophagogastric landmarks identified.                           - 1 cm hiatal hernia.                           - Normal esophagus otherwise - biopsies taken to                            rule out eosinophilic esophagitis.                           -  Normal stomach. Biopsied to rule out H pylori.                           - One small duodenal diverticulum.                           - Normal duodenum otherwise Recommendation:           - Patient has a contact number available for                            emergencies. The signs and symptoms of potential                            delayed complications were discussed with the                            patient. Return to normal activities tomorrow.                            Written discharge instructions were provided to the                            patient.                           - Resume previous diet.                           - Continue present medications.                           - Continue Dexilant 60mg  / day for now if that is                            helping symptoms. Check renal function in 1 month                            to ensure stable (Cr previously increased on                            Protonix in the past per patient)                           - Await pathology results. Remo Lipps P. Lindalee Huizinga, MD 03/03/2020 16:55:37 PM This report has been signed electronically.

## 2020-03-03 NOTE — Patient Instructions (Signed)
Thank you for allowing [name] to care for you today!  Await final biopsy results, approximately 7-10 days.  Will make additional recommendations at that time if needed.  Resume previous diet and medications today.  Prescription sent to your pharmacy for [place].  Return to your normal activities tomorrow.     YOU HAD AN ENDOSCOPIC PROCEDURE TODAY AT [facility]:   Refer to the procedure report that was given to you for any specific questions about what was found during the examination.  If the procedure report does not answer your questions, please call your gastroenterologist to clarify.  If you requested that your care partner not be given the details of your procedure findings, then the procedure report has been included in a sealed envelope for you to review at your convenience later.  YOU SHOULD EXPECT: Some feelings of bloating in the abdomen. Passage of more gas than usual.  Walking can help get rid of the air that was put into your GI tract during the procedure and reduce the bloating. If you had a lower endoscopy (such as a colonoscopy or flexible sigmoidoscopy) you may notice spotting of blood in your stool or on the toilet paper. If you underwent a bowel prep for your procedure, you may not have a normal bowel movement for a few days.  Please Note:  You might notice some irritation and congestion in your nose or some drainage.  This is from the oxygen used during your procedure.  There is no need for concern and it should clear up in a day or so.  SYMPTOMS TO REPORT IMMEDIATELY:   Following lower endoscopy (colonoscopy or flexible sigmoidoscopy):  Excessive amounts of blood in the stool  Significant tenderness or worsening of abdominal pains  Swelling of the abdomen that is new, acute  Fever of 100F or higher   For urgent or emergent issues, a gastroenterologist can be reached at any hour by calling [phone]. Do not use MyChart messaging for urgent  concerns.    DIET:  We do recommend a small meal at first, but then you may proceed to your regular diet.  Drink plenty of fluids but you should avoid alcoholic beverages for 24 hours.  ACTIVITY:  You should plan to take it easy for the rest of today and you should NOT DRIVE or use heavy machinery until tomorrow (because of the sedation medicines used during the test).    FOLLOW UP: Our staff will call the number listed on your records 48-72 hours following your procedure to check on you and address any questions or concerns that you may have regarding the information given to you following your procedure. If we do not reach you, we will leave a message.  We will attempt to reach you two times.  During this call, we will ask if you have developed any symptoms of COVID 19. If you develop any symptoms (ie: fever, flu-like symptoms, shortness of breath, cough etc.) before then, please call [phone].  If you test positive for Covid 19 in the 2 weeks post procedure, please call and report this information to [name].    If any biopsies were taken you will be contacted by phone or by letter within the next 1-3 weeks.  Please call us at [phone] if you have not heard about the biopsies in 3 weeks.    SIGNATURES/CONFIDENTIALITY: You and/or your care partner have signed paperwork which will be entered into your electronic medical record.  These signatures  attest to the fact that that the information above on your After Visit Summary has been reviewed and is understood.  Full responsibility of the confidentiality of this discharge information lies with you and/or your care-partner.

## 2020-03-03 NOTE — Progress Notes (Signed)
Called to room to assist during endoscopic procedure.  Patient ID and intended procedure confirmed with present staff. Received instructions for my participation in the procedure from the performing physician.  

## 2020-03-04 ENCOUNTER — Other Ambulatory Visit: Payer: Self-pay

## 2020-03-04 ENCOUNTER — Telehealth: Payer: Self-pay

## 2020-03-04 DIAGNOSIS — R944 Abnormal results of kidney function studies: Secondary | ICD-10-CM

## 2020-03-04 NOTE — Telephone Encounter (Signed)
PA for Dexilant 60 mg  approved from 03-04-20 through 03-04-2021. Ref #[id number].  [employer].

## 2020-03-04 NOTE — Progress Notes (Signed)
PA submitted for Dexilant via CoverMyMeds.

## 2020-03-07 ENCOUNTER — Telehealth: Payer: Self-pay

## 2020-03-07 NOTE — Telephone Encounter (Signed)
  Follow up Call-  Call back number 03/03/2020  Post procedure Call Back phone  # [phone]  Permission to leave phone message Yes  Some recent data might be hidden     Patient questions:  Do you have a fever, pain , or abdominal swelling? No. Pain Score  0 *  Have you tolerated food without any problems? Yes.    Have you been able to return to your normal activities? Yes.    Do you have any questions about your discharge instructions: Diet   No. Medications  No. Follow up visit  No.  Do you have questions or concerns about your Care? No.  Actions: * If pain score is 4 or above: No action needed, pain <4.   1. Have you developed a fever since your procedure? no  2.   Have you had an respiratory symptoms (SOB or cough) since your procedure? no  3.   Have you tested positive for COVID 19 since your procedure no  4.   Have you had any family members/close contacts diagnosed with the COVID 19 since your procedure?  no   If yes to any of these questions please route to Joylene John, RN and Joella Prince, RN

## 2020-03-08 ENCOUNTER — Other Ambulatory Visit: Payer: Self-pay | Admitting: Hematology and Oncology

## 2020-03-09 ENCOUNTER — Other Ambulatory Visit: Payer: Self-pay

## 2020-03-09 ENCOUNTER — Other Ambulatory Visit: Payer: Self-pay | Admitting: *Deleted

## 2020-03-09 DIAGNOSIS — K219 Gastro-esophageal reflux disease without esophagitis: Secondary | ICD-10-CM

## 2020-03-09 DIAGNOSIS — R944 Abnormal results of kidney function studies: Secondary | ICD-10-CM

## 2020-03-09 MED ORDER — TAMOXIFEN CITRATE 20 MG PO TABS
20.0000 mg | ORAL_TABLET | Freq: Every day | ORAL | 1 refills | Status: DC
Start: 2020-03-09 — End: 2020-08-29

## 2020-03-10 ENCOUNTER — Telehealth: Payer: Self-pay | Admitting: Hematology and Oncology

## 2020-03-10 NOTE — Telephone Encounter (Signed)
Scheduled appt per 11/17 sch msg - unable to reach pt and unable to leave message. Mailed reminder letter with appt date and time

## 2020-03-15 DIAGNOSIS — M67372 Transient synovitis, left ankle and foot: Secondary | ICD-10-CM | POA: Diagnosis not present

## 2020-03-15 DIAGNOSIS — M19071 Primary osteoarthritis, right ankle and foot: Secondary | ICD-10-CM | POA: Diagnosis not present

## 2020-03-15 DIAGNOSIS — M71571 Other bursitis, not elsewhere classified, right ankle and foot: Secondary | ICD-10-CM | POA: Diagnosis not present

## 2020-03-15 DIAGNOSIS — M19072 Primary osteoarthritis, left ankle and foot: Secondary | ICD-10-CM | POA: Diagnosis not present

## 2020-03-21 ENCOUNTER — Other Ambulatory Visit: Payer: Self-pay

## 2020-03-21 ENCOUNTER — Ambulatory Visit (INDEPENDENT_AMBULATORY_CARE_PROVIDER_SITE_OTHER): Payer: BC Managed Care – PPO

## 2020-03-21 ENCOUNTER — Ambulatory Visit (INDEPENDENT_AMBULATORY_CARE_PROVIDER_SITE_OTHER): Payer: BC Managed Care – PPO | Admitting: Podiatry

## 2020-03-21 DIAGNOSIS — M722 Plantar fascial fibromatosis: Secondary | ICD-10-CM

## 2020-03-21 DIAGNOSIS — M79671 Pain in right foot: Secondary | ICD-10-CM

## 2020-03-21 DIAGNOSIS — M79672 Pain in left foot: Secondary | ICD-10-CM | POA: Diagnosis not present

## 2020-03-21 NOTE — Progress Notes (Signed)
Subjective:  Patient presents today status post EPF bilateral. DOS: 10/22/2019. Patient is very frustrated today because of the severe pain. She states that she is unable to work because of the severity of the pain. She most recently received steroid oral anti-inflammatory injections with minimal relief. She states that she feels as if something is broken in the foot. She explains that she has pain along the plantar surface of the foot as well as the outside of the feet. She has tried multiple conservative and surgical modalities with minimal relief. She presents for further treatment evaluation  Past Medical History:  Diagnosis Date  . Angio-edema   . Anxiety    manages naturally  . Arthritis of both knees   . Asthma   . Bilateral swelling of feet   . Breast cancer ([place])    twice  . Depression    pt. reports that she tends to get down & has had Lexapro in the past but its been taken off her list for now.   . Family history of breast cancer   . Family history of kidney cancer   . Family history of lung cancer   . Family history of prostate cancer   . GERD (gastroesophageal reflux disease)   . IBS (irritable bowel syndrome)   . Insulin resistance   . Joint pain   . Kidney failure    stage 1  . Lactose intolerance   . Migraines   . PCOS (polycystic ovarian syndrome)   . PCOS (polycystic ovarian syndrome)   . Urticaria   . Varicose veins of legs      Objective: Physical Exam General: The patient is alert and oriented x3 in no acute distress.  Dermatology: Skin is cool, dry and supple bilateral lower extremities. Negative for open lesions or macerations.  Vascular: Palpable pedal pulses bilaterally. No edema or erythema noted. Capillary refill within normal limits.  Neurological: Epicritic and protective threshold grossly intact bilaterally.   Musculoskeletal Exam: All pedal and ankle joints range of motion within normal limits bilateral. Muscle strength 5/5 in all groups  bilateral. Chronic severe foot pain noted to the bilateral feet. Most significantly along the arches of the feet and 5th metatarsal tubercles bilateral. There is no significant edema noted.  Radiographic exam: Otherwise normal. No fractures identified. Normal osseous mineralization noted. Joint spaces preserved.    Assessment: 1. s/p EPF bilateral. DOS: 10/22/2019 2. Chronic severe foot pain bilateral  Plan of Care:  1. Patient was evaluated.   2. Patient is currently having her custom orthotics adjusted. Currently not available for pickup. Adjustment is pending/processing 3. Today the patient broke down in tears due to the chronic foot pain that she is experiencing. She is adamant that she feels that something is wrong with the foot. 4. MRI ordered today. MRI bilateral feet without contrast. 5. Return to clinic after MRI to review results and discuss further possible treatment options 6. Continue conservative treatment modalities. The patient has tried all conservative treatment modalities available to her  *Works at [employer], DPM [facility]  Dr. Edrick Kins, [address]                                        [place], [place] [address]  Office [phone]  Fax [phone]

## 2020-03-29 ENCOUNTER — Telehealth: Payer: Self-pay | Admitting: Podiatry

## 2020-03-29 ENCOUNTER — Other Ambulatory Visit: Payer: Self-pay | Admitting: Podiatry

## 2020-03-29 MED ORDER — MELOXICAM 15 MG PO TABS
15.0000 mg | ORAL_TABLET | Freq: Every day | ORAL | 1 refills | Status: DC
Start: 2020-03-29 — End: 2020-12-22

## 2020-03-29 MED ORDER — CYCLOBENZAPRINE HCL 10 MG PO TABS: 10.0000 mg | ORAL_TABLET | Freq: Three times a day (TID) | ORAL | 0 refills | Status: DC | PRN

## 2020-03-29 NOTE — Progress Notes (Signed)
PRN muscle spasms

## 2020-03-29 NOTE — Telephone Encounter (Signed)
Patient called in and stated she is still in pain and she has missed worked for two days. When she puts her foot on the floor she is soo much pain. The medication that was prescribed has not helped. Patient would like a note for the days she has been out of work. She needs to know what she can do, so she can work. Please Advise as soon as possible.

## 2020-04-05 ENCOUNTER — Telehealth: Payer: Self-pay

## 2020-04-05 NOTE — Telephone Encounter (Signed)
-----   Message from Yevette Edwards, RN sent at 03/04/2020  9:08 AM EST ----- Regarding: Labs BMET, order in epic

## 2020-04-05 NOTE — Telephone Encounter (Signed)
Called patient twice, vm full and cannot accept any messages, unable to leave a message.

## 2020-04-06 NOTE — Telephone Encounter (Signed)
Called patient, vm is full and cannot accept any messages at this time, unable to leave a message.

## 2020-04-07 ENCOUNTER — Telehealth: Payer: Self-pay | Admitting: Podiatry

## 2020-04-07 ENCOUNTER — Other Ambulatory Visit: Payer: BC Managed Care – PPO

## 2020-04-07 NOTE — Telephone Encounter (Signed)
Patient called in stating [facility] informed her , she needs to reach out to our office to get MRI rescheduled but once I contacted [facility] regarding patient I was told they would contact patient to be rescheduled once authorization from [facility] has been approved

## 2020-04-07 NOTE — Telephone Encounter (Signed)
Patient has requested a call from you, Please Advise

## 2020-04-08 NOTE — Telephone Encounter (Signed)
Spoke with patient to remind her that she is due for repeat labs at this time. Advised that no appt is necessary, she is aware that she can stop by the lab in the basement of our office building between 7:30 AM - 5 PM, Monday through Friday. Patient verbalized understanding of all information and had no concerns at the end of the call.

## 2020-04-25 ENCOUNTER — Telehealth: Payer: Self-pay | Admitting: Podiatry

## 2020-04-25 NOTE — Telephone Encounter (Signed)
Patient LVM stating she received a letter from her insurance company denying MRI study. She would like to know what the next step is. Patient call back number is [phone].

## 2020-04-26 ENCOUNTER — Encounter (INDEPENDENT_AMBULATORY_CARE_PROVIDER_SITE_OTHER): Payer: Self-pay | Admitting: Family Medicine

## 2020-04-26 ENCOUNTER — Ambulatory Visit (INDEPENDENT_AMBULATORY_CARE_PROVIDER_SITE_OTHER): Payer: BC Managed Care – PPO | Admitting: Family Medicine

## 2020-04-26 ENCOUNTER — Other Ambulatory Visit: Payer: Self-pay

## 2020-04-26 VITALS — BP 131/85 | HR 78 | Temp 98.6°F | Ht 60.0 in | Wt 243.0 lb

## 2020-04-26 DIAGNOSIS — E8881 Metabolic syndrome: Secondary | ICD-10-CM | POA: Diagnosis not present

## 2020-04-26 DIAGNOSIS — E559 Vitamin D deficiency, unspecified: Secondary | ICD-10-CM

## 2020-04-26 DIAGNOSIS — F3289 Other specified depressive episodes: Secondary | ICD-10-CM | POA: Diagnosis not present

## 2020-04-26 DIAGNOSIS — Z9189 Other specified personal risk factors, not elsewhere classified: Secondary | ICD-10-CM

## 2020-04-26 DIAGNOSIS — Z6841 Body Mass Index (BMI) 40.0 and over, adult: Secondary | ICD-10-CM

## 2020-04-26 MED ORDER — VITAMIN D (ERGOCALCIFEROL) 1.25 MG (50000 UNIT) PO CAPS: 50000.0000 [IU] | ORAL_CAPSULE | ORAL | 0 refills | Status: DC

## 2020-04-26 MED ORDER — VICTOZA 18 MG/3ML ~~LOC~~ SOPN: 1.2000 mg | PEN_INJECTOR | Freq: Every day | SUBCUTANEOUS | 0 refills | Status: DC

## 2020-04-26 MED ORDER — ESCITALOPRAM OXALATE 10 MG PO TABS: 10.0000 mg | ORAL_TABLET | Freq: Every day | ORAL | 0 refills | Status: DC

## 2020-04-26 MED ORDER — METFORMIN HCL 500 MG PO TABS: 500.0000 mg | ORAL_TABLET | Freq: Two times a day (BID) | ORAL | 0 refills | Status: DC

## 2020-04-27 ENCOUNTER — Telehealth: Payer: Self-pay | Admitting: Podiatry

## 2020-04-27 NOTE — Telephone Encounter (Signed)
Patient called stating she received a letter from her insurance company denying MRI study. She would like to know what the next step is. Patient call back number is [phone] 

## 2020-04-27 NOTE — Progress Notes (Signed)
Chief Complaint:   OBESITY Adriana Moon is here to discuss her progress with her obesity treatment plan along with follow-up of her obesity related diagnoses. Adriana Moon is on the Category 1 Plan and states she is following her eating plan approximately 40% of the time. Adriana Moon states she is not exercising 0 minutes 0 times per week.  Today's visit was #: 15 Starting weight: 260 lbs Starting date: 04/28/2019 Today's weight: 243 Today's date: 04/26/2020 Total lbs lost to date: 17 lbs Total lbs lost since last in-office visit: 0  Interim History: Adriana Moon is really struggling with foot pain while standing so she hasn't been able to work; struggling with depression due to this. She hasn't been following meal plan and hasn't been taking medication(victoza) due to this. She has fallen into drinking cranberry juice and tea again.  Subjective:   1. Insulin resistance Garyn denies GI side effects of metformin. She has not been on meds due to cost.  Lab Results  Component Value Date   HGBA1C 5.4 04/28/2019   HGBA1C 5.5 04/20/2016   Lab Results  Component Value Date   LDLCALC 80 04/28/2019   CREATININE 1.18 02/18/2020   Lab Results  Component Value Date   INSULIN 19.4 04/28/2019    2. Vitamin D deficiency  Adriana Moon's Vitamin D level was 22.2 on 04/28/2019. She is currently taking prescription vitamin D 50,000 IU each week. She denies nausea, vomiting or muscle weakness, and notes fatigue.  3. Other depression, with emotional eating Adriana Moon is struggling with emotional eating and using food for comfort to the extent that it is negatively impacting her health. She has been working on behavior modification techniques to help reduce her emotional eating and has been somewhat successful. She shows no sign of suicidal or homicidal ideations. Adriana Moon is on lexapro 10 mg but has not been taking it due to cost.  4. At risk for depression Adriana Moon is at elevated risk of depression due to one  or more of the following: family history, significant life stressors, medical conditions and/or poor nutrition. She is off medication.  Assessment/Plan:   1. Insulin resistance Good blood sugar control is important to decrease the likelihood of diabetic complications such as nephropathy, neuropathy, limb loss, blindness, coronary artery disease, and death. Intensive lifestyle modification including diet, exercise and weight loss are the first line of treatment for diabetes.   - metFORMIN (GLUCOPHAGE) 500 MG tablet; Take 1 tablet (500 mg total) by mouth 2 (two) times daily.  Dispense: 180 tablet; Refill: 0 - liraglutide (VICTOZA) 18 MG/3ML SOPN; Inject 1.2 mg into the skin daily. Start 0.6mg  SQ once a day  Dispense: 6 mL; Refill: 0  2. Vitamin D deficiency Low Vitamin D level contributes to fatigue and are associated with obesity, breast, and colon cancer. She agrees to continue to take prescription Vitamin D @50 ,000 IU every week and will follow-up for routine testing of Vitamin D, at least 2-3 times per year to avoid over-replacement.  - Vitamin D, Ergocalciferol, (DRISDOL) 1.25 MG (50000 UNIT) CAPS capsule; Take 1 capsule (50,000 Units total) by mouth every 7 (seven) days.  Dispense: 4 capsule; Refill: 0  3. Other depression, with emotional eating Behavior modification techniques were discussed today to help Adriana Moon deal with her emotional/non-hunger eating behaviors.  Orders and follow up as documented in patient record.   - escitalopram (LEXAPRO) 10 MG tablet; Take 1 tablet (10 mg total) by mouth daily.  Dispense: 90 tablet; Refill: 0  4. At risk for  depression Adriana Moon was given approximately 15 minutes of depression risk counseling today. She has risk factors for depression including being off her medication. We discussed the importance of a healthy work life balance, a healthy relationship with food and a good support system.  Repetitive spaced learning was employed today to elicit  superior memory formation and behavioral change.  5. Class 3 severe obesity with serious comorbidity and body mass index (BMI) of 45.0 to 49.9 in adult, unspecified obesity type (HCC)  Adriana Moon is currently in the action stage of change. As such, her goal is to continue with weight loss efforts. She has agreed to practicing portion control and making smarter food choices, such as increasing vegetables and decreasing simple carbohydrates.   Exercise goals: No exercise has been prescribed at this time.  Behavioral modification strategies: increasing lean protein intake, meal planning and cooking strategies, keeping healthy foods in the home and avoiding temptations.  Rozlyn has agreed to follow-up with our clinic in 3 weeks. She was informed of the importance of frequent follow-up visits to maximize her success with intensive lifestyle modifications for her multiple health conditions.   Objective:   Blood pressure 131/85, pulse 78, temperature 98.6 F (37 C), temperature source Oral, height 5' (1.524 m), weight 243 lb (110.2 kg), last menstrual period 04/24/2020, SpO2 97 %. Body mass index is 47.46 kg/m.  General: Cooperative, alert, well developed, in no acute distress. HEENT: Conjunctivae and lids unremarkable. Cardiovascular: Regular rhythm.  Lungs: Normal work of breathing. Neurologic: No focal deficits.   Lab Results  Component Value Date   CREATININE 1.18 02/18/2020   BUN 12 02/18/2020   NA 141 02/18/2020   K 3.5 02/18/2020   CL 107 02/18/2020   CO2 27 02/18/2020   Lab Results  Component Value Date   ALT 7 04/28/2019   AST 7 04/28/2019   ALKPHOS 38 (L) 04/28/2019   BILITOT 0.5 04/28/2019   Lab Results  Component Value Date   HGBA1C 5.4 04/28/2019   HGBA1C 5.5 04/20/2016   Lab Results  Component Value Date   INSULIN 19.4 04/28/2019   Lab Results  Component Value Date   TSH 2.630 04/28/2019   Lab Results  Component Value Date   CHOL 170 04/28/2019   HDL 80  04/28/2019   LDLCALC 80 04/28/2019   TRIG 47 04/28/2019   CHOLHDL 2.3 05/08/2017   Lab Results  Component Value Date   WBC 7.5 04/28/2019   HGB 12.5 04/28/2019   HCT 37.8 04/28/2019   MCV 86 04/28/2019   PLT 338 04/28/2019   Lab Results  Component Value Date   IRON 82 04/28/2019   TIBC 315 04/28/2019   FERRITIN 153 (H) 04/28/2019   I, Delorse Limber, am acting as Energy manager for Reuben Likes, MD.  I have reviewed the above documentation for accuracy and completeness, and I agree with the above. - Katherina Mires, MD

## 2020-04-28 ENCOUNTER — Encounter: Payer: Self-pay | Admitting: Adult Health

## 2020-04-28 ENCOUNTER — Ambulatory Visit: Payer: BC Managed Care – PPO | Admitting: Adult Health

## 2020-04-28 ENCOUNTER — Other Ambulatory Visit (HOSPITAL_COMMUNITY)
Admission: RE | Admit: 2020-04-28 | Discharge: 2020-04-28 | Disposition: A | Discharge status: Home / Self Care | Payer: BC Managed Care – PPO | Source: Ambulatory Visit | Attending: Adult Health | Admitting: Adult Health

## 2020-04-28 ENCOUNTER — Other Ambulatory Visit: Payer: Self-pay

## 2020-04-28 VITALS — BP 113/79 | HR 73 | Ht 60.0 in | Wt 242.5 lb

## 2020-04-28 DIAGNOSIS — Z853 Personal history of malignant neoplasm of breast: Secondary | ICD-10-CM

## 2020-04-28 DIAGNOSIS — N898 Other specified noninflammatory disorders of vagina: Secondary | ICD-10-CM | POA: Insufficient documentation

## 2020-04-28 DIAGNOSIS — R8290 Unspecified abnormal findings in urine: Secondary | ICD-10-CM | POA: Insufficient documentation

## 2020-04-28 LAB — POCT URINALYSIS DIPSTICK
Glucose, UA: NEGATIVE
Leukocytes, UA: NEGATIVE
Nitrite, UA: NEGATIVE
Protein, UA: NEGATIVE

## 2020-04-28 NOTE — Progress Notes (Signed)
  Subjective:     Patient ID: Adriana Moon, female   DOB: [birth year], 40 y.o.   MRN: [id number]  HPI Adriana Moon is a 40 year old black female, G3P0012 in complaining of vaginal discharge, it is white and paste like, feels dry with sex. Adriana Moon been off tamoxifen since October, she just stopped but is starting back. She has had ablation but will bleed some monthly.  PCP is Gilford Silvius, NP   Review of Systems Has white paste like discharge, or itching burning or odor, has used Metrogel  Gets dry with sex Reviewed past medical,surgical, social and family history. Reviewed medications and allergies.     Objective:   Physical Exam BP 113/79 (BP Location: Left Arm, Patient Position: Sitting, Cuff Size: Large)   Pulse 73   Ht 5' (1.524 m)   Wt 242 lb 8 oz (110 kg)   LMP 04/24/2020   BMI 47.36 kg/m urine tace blood, 2+ketones Skin warm and dry.Pelvic: external genitalia is normal in appearance no lesions, vagina: white,paste like discharge without odor,urethra has no lesions or masses noted, cervix:smooth and bulbous, uterus: normal size, shape and contour, non tender, no masses felt, adnexa: no masses or tenderness noted. Bladder is non tender and no masses felt. CV swab obtained. Fall risk is low  Upstream - 04/28/20 1403      Pregnancy Intention Screening   Does the patient want to become pregnant in the next year? N/A    Does the patient's partner want to become pregnant in the next year? N/A    Would the patient like to discuss contraceptive options today? N/A      Contraception Wrap Up   Current Method No Method - Other Reason   ablation   End Method No Method - Other Reason   ablation   Contraception Counseling Provided No         Examination chaperoned by Malachy Mood LPN    Assessment:     1. Cloudy urine  2. Vaginal discharge CV swab sent for GC/CHL, trich and BV,yeast Do not douche  3. History of breast cancer Should be taking tamoxifen  4. Vaginal  dryness Try olive oil or coconut oil      Plan:     Follow up prn

## 2020-05-02 ENCOUNTER — Encounter: Payer: Self-pay | Admitting: Obstetrics & Gynecology

## 2020-05-02 ENCOUNTER — Other Ambulatory Visit: Payer: Self-pay

## 2020-05-02 ENCOUNTER — Ambulatory Visit (INDEPENDENT_AMBULATORY_CARE_PROVIDER_SITE_OTHER): Payer: BC Managed Care – PPO | Admitting: Obstetrics & Gynecology

## 2020-05-02 VITALS — BP 110/73 | HR 82 | Ht 66.0 in | Wt 248.5 lb

## 2020-05-02 DIAGNOSIS — T192XXA Foreign body in vulva and vagina, initial encounter: Secondary | ICD-10-CM

## 2020-05-02 LAB — CERVICOVAGINAL ANCILLARY ONLY
Bacterial Vaginitis (gardnerella): NEGATIVE
Candida Glabrata: NEGATIVE
Candida Vaginitis: NEGATIVE
Chlamydia: NEGATIVE
Comment: NEGATIVE
Comment: NEGATIVE
Comment: NEGATIVE
Comment: NEGATIVE
Comment: NEGATIVE
Comment: NORMAL
Neisseria Gonorrhea: NEGATIVE
Trichomonas: NEGATIVE

## 2020-05-02 NOTE — Progress Notes (Signed)
Chief Complaint  Patient presents with  . detox pearl vaginal area      40 y.o. [id number] Patient's last menstrual period was 04/24/2020. The current method of family planning is tubal ligation.endometrial ablation  Outpatient Encounter Medications as of 05/02/2020  Medication Sig  . acetaminophen (TYLENOL) 500 MG tablet Take 1,000 mg by mouth every 6 (six) hours as needed for moderate pain or headache.  . albuterol (VENTOLIN HFA) 108 (90 Base) MCG/ACT inhaler Inhale 2 puffs into the lungs every 6 (six) hours as needed for wheezing or shortness of breath.  . APPLE CIDER VINEGAR PO Take 1 capsule by mouth 3 (three) times daily.  . cetirizine (ZYRTEC) 10 MG tablet Take 1-2 tablets 1-2 times a day as needed (Patient taking differently: Take 10 mg by mouth every evening.)  . cyclobenzaprine (FLEXERIL) 10 MG tablet Take 1 tablet (10 mg total) by mouth 3 (three) times daily as needed for muscle spasms.  . diazepam (VALIUM) 2 MG tablet Take 1 tablet (2 mg total) by mouth every 6 (six) hours as needed for anxiety.  Marland Kitchen escitalopram (LEXAPRO) 10 MG tablet Take 1 tablet (10 mg total) by mouth daily.  . fluticasone (FLONASE) 50 MCG/ACT nasal spray Place 2 sprays into both nostrils daily.  . fluticasone furoate-vilanterol (BREO ELLIPTA) 100-25 MCG/INH AEPB Inhale 1 puff into the lungs daily.  . Insulin Pen Needle (BD PEN NEEDLE NANO 2ND GEN) 32G X 4 MM MISC 1 Package by Does not apply route daily.  Marland Kitchen liraglutide (VICTOZA) 18 MG/3ML SOPN Inject 1.2 mg into the skin daily. Start 0.6mg  SQ once a day  . meloxicam (MOBIC) 15 MG tablet Take 1 tablet (15 mg total) by mouth daily.  . metFORMIN (GLUCOPHAGE) 500 MG tablet Take 1 tablet (500 mg total) by mouth 2 (two) times daily.  . Multiple Vitamin (MULTIVITAMIN WITH MINERALS) TABS tablet Take 1 tablet by mouth daily.  . tamoxifen (NOLVADEX) 20 MG tablet Take 1 tablet (20 mg total) by mouth daily.  . Vitamin D, Ergocalciferol, (DRISDOL) 1.25 MG (50000  UNIT) CAPS capsule Take 1 capsule (50,000 Units total) by mouth every 7 (seven) days.  Marland Kitchen dexlansoprazole (DEXILANT) 60 MG capsule Take 1 capsule (60 mg total) by mouth daily.  Marland Kitchen HYDROmorphone (DILAUDID) 4 MG tablet Take 1 tablet (4 mg total) by mouth every 4 (four) hours as needed for severe pain. (Patient not taking: Reported on 05/02/2020)  . hydrOXYzine (ATARAX/VISTARIL) 25 MG tablet Take 1 tablet (25 mg total) by mouth 3 (three) times daily as needed for anxiety or itching. (Patient not taking: Reported on 05/02/2020)  . omeprazole (PRILOSEC) 40 MG capsule Take 1 capsule (40 mg total) by mouth at bedtime. TAKE 1 CAPSULE BY MOUTH ONCE DAILY** NEEDS  TO BE SEEN  BEFORE  NEXT  REFILL**  . ondansetron (ZOFRAN) 4 MG tablet Take 1 tablet (4 mg total) by mouth every 8 (eight) hours as needed for nausea or vomiting. (Patient not taking: Reported on 05/02/2020)  . Spacer/Aero Chamber Mouthpiece MISC 1 each by Does not apply route every 6 (six) hours as needed. (Patient not taking: Reported on 05/02/2020)   No facility-administered encounter medications on file as of 05/02/2020.    Subjective Pt has used a vaginal detox "pearl" which she placed a day ago but cannot retrieve it The string is quite long, pt tried but could not retrieve it  Past Medical History:  Diagnosis Date  . Angio-edema   . Anxiety  manages naturally  . Arthritis of both knees   . Asthma   . Bilateral swelling of feet   . Breast cancer ([place])    twice  . Depression    pt. reports that she tends to get down & has had Lexapro in the past but its been taken off her list for now.   . Family history of breast cancer   . Family history of kidney cancer   . Family history of lung cancer   . Family history of prostate cancer   . GERD (gastroesophageal reflux disease)   . IBS (irritable bowel syndrome)   . Insulin resistance   . Joint pain   . Kidney failure    stage 1  . Lactose intolerance   . Migraines   . PCOS (polycystic  ovarian syndrome)   . PCOS (polycystic ovarian syndrome)   . Urticaria   . Varicose veins of legs     Past Surgical History:  Procedure Laterality Date  . BREAST LUMPECTOMY Right 2009  . BREAST RECONSTRUCTION WITH PLACEMENT OF TISSUE EXPANDER AND FLEX HD (ACELLULAR HYDRATED DERMIS) Bilateral 12/22/2018   Procedure: BILATERAL BREAST RECONSTRUCTION WITH PLACEMENT OF TISSUE EXPANDER AND FLEX HD (ACELLULAR HYDRATED DERMIS);  Surgeon: Wallace Going, DO;  Location: [facility];  Service: Plastics;  Laterality: Bilateral;  . BREAST REDUCTION SURGERY Bilateral 01/25/2015   Procedure: MAMMARY REDUCTION  (BREAST);  Surgeon: [name], MD;  Location: [facility];  Service: Plastics;  Laterality: Bilateral;  . CESAREAN SECTION     x2  2002 & 2006  . ECTOPIC PREGNANCY SURGERY  06/2018   R side , done at [facility]    . MASTECTOMY W/ SENTINEL NODE BIOPSY Bilateral 12/22/2018   with tissue expanders  . MASTECTOMY W/ SENTINEL NODE BIOPSY Bilateral 12/22/2018   Procedure: BILATERAL MASTECTOMIES WITH RIGHT SENTINEL LYMPH NODE BIOPSY;  Surgeon: Coralie Keens, MD;  Location: [facility];  Service: General;  Laterality: Bilateral;  . PLANTAR FASCIA SURGERY Bilateral   . REDUCTION MAMMAPLASTY Left   . REMOVAL OF BILATERAL TISSUE EXPANDERS WITH PLACEMENT OF BILATERAL BREAST IMPLANTS Bilateral 04/06/2019   Procedure: REMOVAL OF BILATERAL TISSUE EXPANDERS WITH PLACEMENT OF BILATERAL BREAST IMPLANTS;  Surgeon: Wallace Going, DO;  Location: [facility];  Service: Plastics;  Laterality: Bilateral;  . RT lumpectomy      OB History    Gravida  3   Para  2   Term      Preterm      AB  1   Living  2     SAB      IAB      Ectopic  1   Multiple      Live Births              Allergies  Allergen Reactions  . Prednisone     Lost taste in her mouth, also makes "me crazy & mean"   . Tolmetin Hives and Swelling  . Other Cough    pollen  .  Nsaids Hives and Swelling  . Percocet [Oxycodone-Acetaminophen] Nausea Only    Very lightheaded.  . Shellfish Allergy Hives and Swelling    Social History   Socioeconomic History  . Marital status: Legally Separated    Spouse name: Not on file  . Number of children: 2  . Years of education: Not on file  . Highest education level: Not on file  Occupational History  . Occupation:  [employer]  Tobacco Use  . Smoking status: Never Smoker  . Smokeless tobacco: Never Used  Vaping Use  . Vaping Use: Never used  Substance and Sexual Activity  . Alcohol use: Yes    Alcohol/week: 0.0 standard drinks    Comment: occ. - socially  . Drug use: No  . Sexual activity: Yes    Birth control/protection: Surgical    Comment: ablation  Other Topics Concern  . Not on file  Social History Narrative  . Not on file   Social Determinants of Health   Financial Resource Strain: Not on file  Food Insecurity: Not on file  Transportation Needs: Not on file  Physical Activity: Not on file  Stress: Not on file  Social Connections: Not on file    Family History  Problem Relation Age of Onset  . Diabetes Father   . Hypertension Father   . Gout Father   . Obesity Father   . Sleep apnea Father   . Colon polyps Father   . Diverticulitis Father   . Esophageal cancer Son   . Breast cancer Paternal Grandmother        late 47s  . Emphysema Maternal Grandfather   . Lung cancer Maternal Grandfather        23s  . Diabetes Maternal Aunt   . Kidney disease Maternal Aunt   . Prostate cancer Maternal Uncle        late 52s  . Diabetes Maternal Grandmother   . Kidney disease Maternal Grandmother   . Kidney cancer Other 48       4th degree paternal relative  . Colon cancer Other   . Rectal cancer Neg Hx   . Stomach cancer Neg Hx     Medications:       Current Outpatient Medications:  .  acetaminophen (TYLENOL) 500 MG tablet, Take 1,000 mg by mouth every 6 (six) hours as needed for moderate  pain or headache., Disp: , Rfl:  .  albuterol (VENTOLIN HFA) 108 (90 Base) MCG/ACT inhaler, Inhale 2 puffs into the lungs every 6 (six) hours as needed for wheezing or shortness of breath., Disp: 18 g, Rfl: 11 .  APPLE CIDER VINEGAR PO, Take 1 capsule by mouth 3 (three) times daily., Disp: , Rfl:  .  cetirizine (ZYRTEC) 10 MG tablet, Take 1-2 tablets 1-2 times a day as needed (Patient taking differently: Take 10 mg by mouth every evening.), Disp: 180 tablet, Rfl: 3 .  cyclobenzaprine (FLEXERIL) 10 MG tablet, Take 1 tablet (10 mg total) by mouth 3 (three) times daily as needed for muscle spasms., Disp: 90 tablet, Rfl: 0 .  diazepam (VALIUM) 2 MG tablet, Take 1 tablet (2 mg total) by mouth every 6 (six) hours as needed for anxiety., Disp: 30 tablet, Rfl: 0 .  escitalopram (LEXAPRO) 10 MG tablet, Take 1 tablet (10 mg total) by mouth daily., Disp: 90 tablet, Rfl: 0 .  fluticasone (FLONASE) 50 MCG/ACT nasal spray, Place 2 sprays into both nostrils daily., Disp: 16 g, Rfl: 6 .  fluticasone furoate-vilanterol (BREO ELLIPTA) 100-25 MCG/INH AEPB, Inhale 1 puff into the lungs daily., Disp: 60 each, Rfl: 11 .  Insulin Pen Needle (BD PEN NEEDLE NANO 2ND GEN) 32G X 4 MM MISC, 1 Package by Does not apply route daily., Disp: 100 each, Rfl: 0 .  liraglutide (VICTOZA) 18 MG/3ML SOPN, Inject 1.2 mg into the skin daily. Start 0.6mg  SQ once a day, Disp: 6 mL, Rfl: 0 .  meloxicam (MOBIC)  15 MG tablet, Take 1 tablet (15 mg total) by mouth daily., Disp: 30 tablet, Rfl: 1 .  metFORMIN (GLUCOPHAGE) 500 MG tablet, Take 1 tablet (500 mg total) by mouth 2 (two) times daily., Disp: 180 tablet, Rfl: 0 .  Multiple Vitamin (MULTIVITAMIN WITH MINERALS) TABS tablet, Take 1 tablet by mouth daily., Disp: , Rfl:  .  tamoxifen (NOLVADEX) 20 MG tablet, Take 1 tablet (20 mg total) by mouth daily., Disp: 30 tablet, Rfl: 1 .  Vitamin D, Ergocalciferol, (DRISDOL) 1.25 MG (50000 UNIT) CAPS capsule, Take 1 capsule (50,000 Units total) by mouth  every 7 (seven) days., Disp: 4 capsule, Rfl: 0 .  dexlansoprazole (DEXILANT) 60 MG capsule, Take 1 capsule (60 mg total) by mouth daily., Disp: 90 capsule, Rfl: 3 .  HYDROmorphone (DILAUDID) 4 MG tablet, Take 1 tablet (4 mg total) by mouth every 4 (four) hours as needed for severe pain. (Patient not taking: Reported on 05/02/2020), Disp: 30 tablet, Rfl: 0 .  hydrOXYzine (ATARAX/VISTARIL) 25 MG tablet, Take 1 tablet (25 mg total) by mouth 3 (three) times daily as needed for anxiety or itching. (Patient not taking: Reported on 05/02/2020), Disp: 30 tablet, Rfl: 11 .  omeprazole (PRILOSEC) 40 MG capsule, Take 1 capsule (40 mg total) by mouth at bedtime. TAKE 1 CAPSULE BY MOUTH ONCE DAILY** NEEDS  TO BE SEEN  BEFORE  NEXT  REFILL**, Disp: 90 capsule, Rfl: 3 .  ondansetron (ZOFRAN) 4 MG tablet, Take 1 tablet (4 mg total) by mouth every 8 (eight) hours as needed for nausea or vomiting. (Patient not taking: Reported on 05/02/2020), Disp: 20 tablet, Rfl: 0 .  Spacer/Aero Chamber Mouthpiece MISC, 1 each by Does not apply route every 6 (six) hours as needed. (Patient not taking: Reported on 05/02/2020), Disp: 1 each, Rfl: 0  Objective Blood pressure 110/73, pulse 82, height 5\' 6"  (1.676 m), weight 248 lb 8 oz (112.7 kg), last menstrual period 04/24/2020.  No pearl was found either using a speculum visually or manually I made several passes  Pertinent ROS No burning with urination, frequency or urgency No nausea, vomiting or diarrhea Nor fever chills or other constitutional symptoms   Labs or studies     Impression Diagnoses this Encounter::   ICD-10-CM   1. Foreign body in vagina, initial encounter  T19.2XXA    turns out there was not a detox "pearl" in the vagina    Established relevant diagnosis(es):   Plan/Recommendations: No orders of the defined types were placed in this encounter.   Labs or Scans Ordered: No orders of the defined types were placed in this  encounter.   Management:: No foreign object was located in the vagina  Follow up Return if symptoms worsen or fail to improve.      All questions were answered.

## 2020-05-03 ENCOUNTER — Telehealth: Payer: Self-pay | Admitting: Podiatry

## 2020-05-03 NOTE — Telephone Encounter (Signed)
Patient called stating she received a letter from her insurance company denying MRI study. She would like to know what the next step is. Patient call back number is [phone]

## 2020-05-03 NOTE — Telephone Encounter (Signed)
Spoke with patient. Is there any way to set up a peer-to-peer with her insurance to see if we can approve her MRI's bilateral feet?  Thanks, Dr. Amalia Hailey

## 2020-05-04 ENCOUNTER — Telehealth: Payer: Self-pay | Admitting: Podiatry

## 2020-05-04 NOTE — Telephone Encounter (Signed)
Adriana Moon is still out of work, how long should I extend her leave, it is up on 05/08/2020?

## 2020-05-04 NOTE — Telephone Encounter (Signed)
Could we extend it an additional 4 weeks to 06/08/20? Thanks Adriana Moon!

## 2020-05-05 ENCOUNTER — Encounter: Payer: Self-pay | Admitting: Podiatry

## 2020-05-09 NOTE — Progress Notes (Signed)
Patient Care Team: Baruch Gouty, FNP (Inactive) as PCP - General (Family Medicine) Gala Romney, Cristopher Estimable, MD as Consulting Physician (Gastroenterology) Nicholas Lose, MD as Consulting Physician (Hematology and Oncology) Dillingham, Loel Lofty, DO as Attending Physician (Plastic Surgery) Coralie Keens, MD as Consulting Physician (General Surgery) Gery Pray, MD as Consulting Physician (Radiation Oncology)  DIAGNOSIS:    ICD-10-CM   1. Malignant neoplasm of upper-outer quadrant of right breast in female, estrogen receptor positive ([place])  C50.411    Z17.0     SUMMARY OF ONCOLOGIC HISTORY: Oncology History  Breast cancer of lower-outer quadrant of right female breast ([place])  06/17/2007 Surgery   right lumpectomy: Invasive ductal carcinoma 1.8 cm with abundant extracellular mucin 1.8 cm, grade 2, lymphovascular invasion present, margins negative, 7 lymph nodes negative, T1 cN0 stage IA   07/15/2007 - 08/27/2007 Radiation Therapy   adjuvant radiation therapy   09/25/2007 - 09/24/2009 Anti-estrogen oral therapy   Tamoxifen with Zoladex for 2 years patient stopped it because she felt foggy in the head   01/25/2015 Surgery   breast reduction surgery: Right breast atypical ductal hyperplasia microscopic focus left breast benign   02/25/2015 -  Anti-estrogen oral therapy   tamoxifen 20 mg daily restarted   11/03/2018 Relapse/Recurrence   Patient palpated right breast changes. Mammogram showed 4 masses in the right breast measuring 1.0cm at 2 o'clock, 0.8cm at 10 o'clock, and 0.7cm and 0.5cm at 9:30, with no right axillary adenopathy. Biopsy confirmed IDC, grade 2, HER-2 - (1+), ER +100%, PR+ 100%, Ki67 30%.    11/28/2018 Genetic Testing   Negative genetic testing results on the [facility] Common Hereditary Cancers panel. 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  The Common Hereditary Gene Panel offered by [facility] includes sequencing and/or deletion  duplication testing of the following 48 genes: APC, ATM, AXIN2, BARD1, BMPR1A, BRCA1, BRCA2, BRIP1, CDH1, CDK4, CDKN2A (p14ARF), CDKN2A (p16INK4a), CHEK2, CTNNA1, DICER1, EPCAM (Deletion/duplication testing only), GREM1 (promoter region deletion/duplication testing only), KIT, MEN1, MLH1, MSH2, MSH3, MSH6, MUTYH, NBN, NF1, NHTL1, PALB2, PDGFRA, PMS2, POLD1, POLE, PTEN, RAD50, RAD51C, RAD51D, RNF43, SDHB, SDHC, SDHD, SMAD4, SMARCA4. STK11, TP53, TSC1, TSC2, and VHL.  The following genes were evaluated for sequence changes only: SDHA and HOXB13 c.251G>A variant only.    12/22/2018 Surgery   Bilateral mastectomies Ninfa Linden): left breast: benign; right breast: IDC with DCIS, 3cm, clear margins, and one negative right axillary lymph node.    01/23/2019 Oncotype testing   The Oncotype DX score was 18 predicting a risk of outside the breast recurrence over the next 9 years of 5% if the patient's only systemic therapy is tamoxifen for 5 years.     Malignant neoplasm of upper-outer quadrant of right breast in female, estrogen receptor positive ([place])  11/03/2018 Cancer Staging   Staging form: Breast, AJCC 8th Edition - Clinical stage from 11/03/2018: Stage IA (cT1b, cN0, cM0, G2, ER+, PR+, HER2-) - Signed by Gardenia Phlegm, NP on 11/12/2018   11/03/2018 Initial Diagnosis   Palpable abnormality in the right breast status post right lower breast cancer in 2009 and bilateral reduction mammoplasty in 2016, ultrasound revealed 1 cm lobulated hypoechoic mass, additional masses 8 mm, 7 mm and 5 mm were noted.  Biopsy ([id number]) right breast 10 o'clock position: IDC grade 2, ER 100%, PR 5%, Ki-67 30%, HER-2 -1+: Biopsy 2 o'clock position: Fat necrosis   11/28/2018 Genetic Testing   Negative genetic testing results on the [facility] Common Hereditary Cancers  panel. 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  The Common Hereditary Gene Panel offered by [facility] includes  sequencing and/or deletion duplication testing of the following 48 genes: APC, ATM, AXIN2, BARD1, BMPR1A, BRCA1, BRCA2, BRIP1, CDH1, CDK4, CDKN2A (p14ARF), CDKN2A (p16INK4a), CHEK2, CTNNA1, DICER1, EPCAM (Deletion/duplication testing only), GREM1 (promoter region deletion/duplication testing only), KIT, MEN1, MLH1, MSH2, MSH3, MSH6, MUTYH, NBN, NF1, NHTL1, PALB2, PDGFRA, PMS2, POLD1, POLE, PTEN, RAD50, RAD51C, RAD51D, RNF43, SDHB, SDHC, SDHD, SMAD4, SMARCA4. STK11, TP53, TSC1, TSC2, and VHL.  The following genes were evaluated for sequence changes only: SDHA and HOXB13 c.251G>A variant only.    12/22/2018 Surgery   Bilateral mastectomies Evlyn Courier) [phone]): Benign left breast, and in the right breast, invasive ductal carcinoma with DCIS, 3cm, clear margins, and one negative right axillary lymph node.    12/31/2018 Cancer Staging   Staging form: Breast, AJCC 8th Edition - Pathologic stage from 12/31/2018: Stage IA (pT2, pN0, cM0, G2, ER+, PR+, HER2-) - Signed by Nicholas Lose, MD on 12/31/2018   01/23/2019 Oncotype testing   The Oncotype DX score was 18 predicting a risk of outside the breast recurrence over the next 9 years of 5% if the patient's only systemic therapy is tamoxifen for 5 years.     01/2019 - 01/2029 Anti-estrogen oral therapy   Tamoxifen 20 mg   04/06/2019 Surgery   Reconstruction ([place])     CHIEF COMPLIANT: Follow-up of right breast cancer on tamoxifen  INTERVAL HISTORY: Bernadene Garside is a 40 y.o. with above-mentioned history of right breast cancer who underwent bilateral mastectomies with reconstruction and is currently on antiestrogen therapy with tamoxifen. She presents to the clinic today for follow-up.  She informed me that she is stopped taking tamoxifen for 3 months and just  Resumed it.  She reports occasional hot flashes but otherwise tolerating it very well.  Denies any lumps or nodules in the reconstructed breast.  ALLERGIES:  is allergic to  prednisone, tolmetin, other, nsaids, percocet [oxycodone-acetaminophen], and shellfish allergy.  MEDICATIONS:  Current Outpatient Medications  Medication Sig Dispense Refill   acetaminophen (TYLENOL) 500 MG tablet Take 1,000 mg by mouth every 6 (six) hours as needed for moderate pain or headache.     albuterol (VENTOLIN HFA) 108 (90 Base) MCG/ACT inhaler Inhale 2 puffs into the lungs every 6 (six) hours as needed for wheezing or shortness of breath. 18 g 11   APPLE CIDER VINEGAR PO Take 1 capsule by mouth 3 (three) times daily.     cetirizine (ZYRTEC) 10 MG tablet Take 1-2 tablets 1-2 times a day as needed (Patient taking differently: Take 10 mg by mouth every evening.) 180 tablet 3   cyclobenzaprine (FLEXERIL) 10 MG tablet Take 1 tablet (10 mg total) by mouth 3 (three) times daily as needed for muscle spasms. 90 tablet 0   dexlansoprazole (DEXILANT) 60 MG capsule Take 1 capsule (60 mg total) by mouth daily. 90 capsule 3   diazepam (VALIUM) 2 MG tablet Take 1 tablet (2 mg total) by mouth every 6 (six) hours as needed for anxiety. 30 tablet 0   escitalopram (LEXAPRO) 10 MG tablet Take 1 tablet (10 mg total) by mouth daily. 90 tablet 0   fluticasone (FLONASE) 50 MCG/ACT nasal spray Place 2 sprays into both nostrils daily. 16 g 6   fluticasone furoate-vilanterol (BREO ELLIPTA) 100-25 MCG/INH AEPB Inhale 1 puff into the lungs daily. 60 each 11   HYDROmorphone (DILAUDID) 4 MG tablet Take 1  tablet (4 mg total) by mouth every 4 (four) hours as needed for severe pain. (Patient not taking: Reported on 05/02/2020) 30 tablet 0   hydrOXYzine (ATARAX/VISTARIL) 25 MG tablet Take 1 tablet (25 mg total) by mouth 3 (three) times daily as needed for anxiety or itching. (Patient not taking: Reported on 05/02/2020) 30 tablet 11   Insulin Pen Needle (BD PEN NEEDLE NANO 2ND GEN) 32G X 4 MM MISC 1 Package by Does not apply route daily. 100 each 0   liraglutide (VICTOZA) 18 MG/3ML SOPN Inject 1.2 mg into the  skin daily. Start 0.25m SQ once a day 6 mL 0   meloxicam (MOBIC) 15 MG tablet Take 1 tablet (15 mg total) by mouth daily. 30 tablet 1   metFORMIN (GLUCOPHAGE) 500 MG tablet Take 1 tablet (500 mg total) by mouth 2 (two) times daily. 180 tablet 0   Multiple Vitamin (MULTIVITAMIN WITH MINERALS) TABS tablet Take 1 tablet by mouth daily.     omeprazole (PRILOSEC) 40 MG capsule Take 1 capsule (40 mg total) by mouth at bedtime. TAKE 1 CAPSULE BY MOUTH ONCE DAILY** NEEDS  TO BE SEEN  BEFORE  NEXT  REFILL** 90 capsule 3   ondansetron (ZOFRAN) 4 MG tablet Take 1 tablet (4 mg total) by mouth every 8 (eight) hours as needed for nausea or vomiting. (Patient not taking: Reported on 05/02/2020) 20 tablet 0   Spacer/Aero Chamber Mouthpiece MISC 1 each by Does not apply route every 6 (six) hours as needed. (Patient not taking: Reported on 05/02/2020) 1 each 0   tamoxifen (NOLVADEX) 20 MG tablet Take 1 tablet (20 mg total) by mouth daily. 30 tablet 1   Vitamin D, Ergocalciferol, (DRISDOL) 1.25 MG (50000 UNIT) CAPS capsule Take 1 capsule (50,000 Units total) by mouth every 7 (seven) days. 4 capsule 0   No current facility-administered medications for this visit.    PHYSICAL EXAMINATION: ECOG PERFORMANCE STATUS: 1 - Symptomatic but completely ambulatory  Vitals:   05/10/20 1539  BP: 131/72  Pulse: 81  Resp: 16  Temp: 97.9 F (36.6 C)  SpO2: 100%   Filed Weights   05/10/20 1539  Weight: 256 lb 3.2 oz (116.2 kg)    BREAST: No palpable lumps or nodules in bilateral reconstructed breasts or axilla. (exam performed in the presence of a chaperone)  LABORATORY DATA:  I have reviewed the data as listed CMP Latest Ref Rng & Units 02/18/2020 04/28/2019 12/16/2018  Glucose 70 - 99 mg/dL 91 81 80  BUN 6 - 23 mg/dL 12 16 16   Creatinine 0.40 - 1.20 mg/dL 1.18 1.06(H) 1.08(H)  Sodium 135 - 145 mEq/L 141 142 137  Potassium 3.5 - 5.1 mEq/L 3.5 4.2 3.9  Chloride 96 - 112 mEq/L 107 105 104  CO2 19 - 32 mEq/L 27  26 25   Calcium 8.4 - 10.5 mg/dL 9.1 9.2 8.9  Total Protein 6.0 - 8.5 g/dL - 7.3 -  Total Bilirubin 0.0 - 1.2 mg/dL - 0.5 -  Alkaline Phos 39 - 117 IU/L - 38(L) -  AST 0 - 40 IU/L - 7 -  ALT 0 - 32 IU/L - 7 -    Lab Results  Component Value Date   WBC 7.5 04/28/2019   HGB 12.5 04/28/2019   HCT 37.8 04/28/2019   MCV 86 04/28/2019   PLT 338 04/28/2019   NEUTROABS 3.9 04/28/2019    ASSESSMENT & PLAN:  Malignant neoplasm of upper-outer quadrant of right breast in female, estrogen receptor positive ([place]  12/22/2018: Bilateral mastectomies:benign left breast, and in the right breast, invasive ductal carcinoma with DCIS, 3cm, clear margins, and one negative right axillary lymph node.  Oncotype DX score 18: Distant recurrence at 9 years: 5%  Current treatment: Adjuvant antiestrogen therapy with tamoxifen started 01/29/2019 stopped for 3 months and resumed January 2022 Tamoxifen toxicities: Occasional hot flashes and night sweats but tolerating it well I encouraged her to stay compliant on tamoxifen.  Unfortunately she takes a lot of medications and therefore she gets tired of taking medicines.  Breast cancer surveillance: 1.  Chest exam 05/10/2020: Benign  Return to clinic in 1 year for follow-up  No orders of the defined types were placed in this encounter.  The patient has a good understanding of the overall plan. she agrees with it. she will call with any problems that may develop before the next visit here.  Total time spent: 20 mins including face to face time and time spent for planning, charting and coordination of care  Nicholas Lose, MD 05/10/2020  I, Cloyde Reams Dorshimer, am acting as scribe for Dr. Nicholas Lose.  I have reviewed the above documentation for accuracy and completeness, and I agree with the above.

## 2020-05-10 ENCOUNTER — Inpatient Hospital Stay: Payer: BC Managed Care – PPO | Attending: Hematology and Oncology | Admitting: Hematology and Oncology

## 2020-05-10 ENCOUNTER — Other Ambulatory Visit: Payer: Self-pay

## 2020-05-10 DIAGNOSIS — C50411 Malignant neoplasm of upper-outer quadrant of right female breast: Secondary | ICD-10-CM | POA: Diagnosis not present

## 2020-05-10 DIAGNOSIS — Z79810 Long term (current) use of selective estrogen receptor modulators (SERMs): Secondary | ICD-10-CM | POA: Insufficient documentation

## 2020-05-10 DIAGNOSIS — Z9013 Acquired absence of bilateral breasts and nipples: Secondary | ICD-10-CM | POA: Insufficient documentation

## 2020-05-10 DIAGNOSIS — Z170 Estrogen receptor positive status [ER+]: Secondary | ICD-10-CM | POA: Insufficient documentation

## 2020-05-10 NOTE — Assessment & Plan Note (Signed)
12/22/2018: Bilateral mastectomies:benign left breast, and in the right breast, invasive ductal carcinoma with DCIS, 3cm, clear margins, and one negative right axillary lymph node.  Oncotype DX score 18: Distant recurrence at 9 years: 5%  Current treatment: Adjuvant antiestrogen therapy with tamoxifen started 01/29/2019 Tamoxifen toxicities:  Breast cancer surveillance:  Return to clinic in 1 year for follow-up

## 2020-05-11 ENCOUNTER — Telehealth: Payer: Self-pay | Admitting: Podiatry

## 2020-05-11 NOTE — Telephone Encounter (Signed)
Pt called and stated her MRI was denied at [facility] imaging. They stated that she would need a peer to peer. Please call Provider services [phone].

## 2020-05-17 ENCOUNTER — Encounter (INDEPENDENT_AMBULATORY_CARE_PROVIDER_SITE_OTHER): Payer: Self-pay | Admitting: Family Medicine

## 2020-05-17 ENCOUNTER — Other Ambulatory Visit: Payer: Self-pay

## 2020-05-17 ENCOUNTER — Ambulatory Visit (INDEPENDENT_AMBULATORY_CARE_PROVIDER_SITE_OTHER): Payer: BC Managed Care – PPO | Admitting: Family Medicine

## 2020-05-17 VITALS — BP 114/76 | HR 81 | Temp 98.3°F | Ht 66.0 in | Wt 248.0 lb

## 2020-05-17 DIAGNOSIS — E8881 Metabolic syndrome: Secondary | ICD-10-CM

## 2020-05-17 DIAGNOSIS — F419 Anxiety disorder, unspecified: Secondary | ICD-10-CM | POA: Diagnosis not present

## 2020-05-17 DIAGNOSIS — F32A Depression, unspecified: Secondary | ICD-10-CM | POA: Diagnosis not present

## 2020-05-17 DIAGNOSIS — Z6841 Body Mass Index (BMI) 40.0 and over, adult: Secondary | ICD-10-CM

## 2020-05-18 NOTE — Progress Notes (Signed)
Chief Complaint:   OBESITY Adriana Moon is here to discuss her progress with her obesity treatment plan along with follow-up of her obesity related diagnoses. Alanah is on the Category 1 Plan and states she is following her eating plan approximately 40% of the time. Adriana Moon states she is not exercising.  Today's visit was #: 28 Starting weight: 260 lbs Starting date: 04/28/2019 Today's weight: 248 lbs Today's date: 05/17/2020 Total lbs lost to date: 12 lbs Total lbs lost since last in-office visit: 0  Interim History: Adriana Moon is feeling bored with meal plan. She tries to eat cereal but no measuring because she doesn't eat much. Often just eating 2 times a day only. Now Adriana Moon is home all day and is often missing meals due to time getting away from her.  Subjective:   1. Insulin resistance Adriana Moon is on 0.6 mg Victoza daily. She is still experiencing gravitation toward carbs.  2. Anxiety and depression Adriana Moon's symptoms are better controlled on medication now that she restarted medication. She denies suicidal or homicidal ideas.   Assessment/Plan:   1. Insulin resistance Adriana Moon will continue to work on weight loss, exercise, and decreasing simple carbohydrates to help decrease the risk of diabetes. Adriana Moon agreed to follow-up with [name] as directed to closely monitor her progress. Increase Victoza 5 clicks to 0.9 mg 1 time a week,  then increase another 5 clicks to 1.2 mg until next appointment.  2. Anxiety and depression Behavior modification techniques were discussed today to help Adriana Moon deal with her emotional/non-hunger eating behaviors.  Orders and follow up as documented in patient record. Barbi will continue current medications with no change in dose.  3. Class 3 severe obesity with serious comorbidity and body mass index (BMI) of 45.0 to 49.9 in adult, unspecified obesity type (HCC)  Adriana Moon is currently in the action stage of change. As such, her goal is to  continue with weight loss efforts. She has agreed to following a lower carbohydrate, vegetable and lean protein rich diet plan.   Counseled that she must increase compliance to any meal plan to see more consistent results.  Exercise goals: No exercise has been prescribed at this time.  Behavioral modification strategies: increasing lean protein intake, no skipping meals, meal planning and cooking strategies and keeping healthy foods in the home.  Adriana Moon has agreed to follow-up with our clinic in 2 weeks. She was informed of the importance of frequent follow-up visits to maximize her success with intensive lifestyle modifications for her multiple health conditions.     Objective:   Blood pressure 114/76, pulse 81, temperature 98.3 F (36.8 C), temperature source Oral, height 5\' 6"  (1.676 m), weight 248 lb (112.5 kg), last menstrual period 04/24/2020, SpO2 99 %. Body mass index is 40.03 kg/m.  General: Cooperative, alert, well developed, in no acute distress. HEENT: Conjunctivae and lids unremarkable. Cardiovascular: Regular rhythm.  Lungs: Normal work of breathing. Neurologic: No focal deficits.   Lab Results  Component Value Date   CREATININE 1.18 02/18/2020   BUN 12 02/18/2020   NA 141 02/18/2020   K 3.5 02/18/2020   CL 107 02/18/2020   CO2 27 02/18/2020   Lab Results  Component Value Date   ALT 7 04/28/2019   AST 7 04/28/2019   ALKPHOS 38 (L) 04/28/2019   BILITOT 0.5 04/28/2019   Lab Results  Component Value Date   HGBA1C 5.4 04/28/2019   HGBA1C 5.5 04/20/2016   Lab Results  Component Value Date   INSULIN  19.4 04/28/2019   Lab Results  Component Value Date   TSH 2.630 04/28/2019   Lab Results  Component Value Date   CHOL 170 04/28/2019   HDL 80 04/28/2019   LDLCALC 80 04/28/2019   TRIG 47 04/28/2019   CHOLHDL 2.3 05/08/2017   Lab Results  Component Value Date   WBC 7.5 04/28/2019   HGB 12.5 04/28/2019   HCT 37.8 04/28/2019   MCV 86 04/28/2019    PLT 338 04/28/2019   Lab Results  Component Value Date   IRON 82 04/28/2019   TIBC 315 04/28/2019   FERRITIN 153 (H) 04/28/2019      Attestation Statements:   Reviewed by clinician on day of visit: allergies, medications, problem list, medical history, surgical history, family history, social history, and previous encounter notes.   I, Para March, am acting as transcriptionist for Coralie Common, MD. I have reviewed the above documentation for accuracy and completeness, and I agree with the above. - Jinny Blossom, MD

## 2020-05-31 ENCOUNTER — Ambulatory Visit (INDEPENDENT_AMBULATORY_CARE_PROVIDER_SITE_OTHER): Payer: BC Managed Care – PPO | Admitting: Family Medicine

## 2020-05-31 ENCOUNTER — Encounter (INDEPENDENT_AMBULATORY_CARE_PROVIDER_SITE_OTHER): Payer: Self-pay | Admitting: Family Medicine

## 2020-05-31 ENCOUNTER — Other Ambulatory Visit: Payer: Self-pay

## 2020-05-31 VITALS — BP 110/78 | HR 92 | Temp 97.8°F | Ht 66.0 in | Wt 232.0 lb

## 2020-05-31 DIAGNOSIS — E559 Vitamin D deficiency, unspecified: Secondary | ICD-10-CM | POA: Diagnosis not present

## 2020-05-31 DIAGNOSIS — Z6837 Body mass index (BMI) 37.0-37.9, adult: Secondary | ICD-10-CM | POA: Diagnosis not present

## 2020-05-31 DIAGNOSIS — Z9189 Other specified personal risk factors, not elsewhere classified: Secondary | ICD-10-CM

## 2020-05-31 DIAGNOSIS — E8881 Metabolic syndrome: Secondary | ICD-10-CM | POA: Diagnosis not present

## 2020-05-31 MED ORDER — VICTOZA 18 MG/3ML ~~LOC~~ SOPN: 1.2000 mg | PEN_INJECTOR | Freq: Every day | SUBCUTANEOUS | 0 refills | Status: DC

## 2020-05-31 MED ORDER — BD PEN NEEDLE NANO 2ND GEN 32G X 4 MM MISC: 1.0000 | Freq: Two times a day (BID) | 0 refills | Status: DC

## 2020-06-01 NOTE — Progress Notes (Signed)
Chief Complaint:   OBESITY Adriana Moon is here to discuss her progress with her obesity treatment plan along with follow-up of her obesity related diagnoses. Adriana Moon is on following a lower carbohydrate, vegetable and lean protein rich diet plan and states she is following her eating plan approximately 100% of the time. Adriana Moon states she is doing 0 minutes 0 times per week.  Today's visit was #: 24 Starting weight: 260 lbs Starting date: 04/28/2019 Today's weight: 232 lbs Today's date: 05/31/2020 Total lbs lost to date: 28 lbs Total lbs lost since last in-office visit: 16 lbs  Interim History: Pt has been extremely compliant with low carb plan over the last few weeks. She denies hunger. Feels that she may want to eat indulgently 1 time a week or every other week. May be interested in figuring how to incorporate pancakes into low carb plan.  Subjective:   1. Vitamin D deficiency Pt denies nausea, vomiting, and muscle weakness but notes fatigue. Pt is on prescription Vit D.  2. Insulin resistance Pt is on Victoza 1.2 mg SubQ daily. She denies GI side effects.  Lab Results  Component Value Date   INSULIN 19.4 04/28/2019   Lab Results  Component Value Date   HGBA1C 5.4 04/28/2019    3. At risk for osteoporosis Adriana Moon is at higher risk of osteopenia and osteoporosis due to Vitamin D deficiency.    Assessment/Plan:   1. Vitamin D deficiency Low Vitamin D level contributes to fatigue and are associated with obesity, breast, and colon cancer. She agrees to continue to take prescription Vitamin D @50 ,000 IU every week and will follow-up for routine testing of Vitamin D, at least 2-3 times per year to avoid over-replacement.  2. Insulin resistance Adriana Moon will continue to work on weight loss, exercise, and decreasing simple carbohydrates to help decrease the risk of diabetes. Adriana Moon agreed to follow-up with [name] as directed to closely monitor her progress.  - Insulin Pen  Needle (BD PEN NEEDLE NANO 2ND GEN) 32G X 4 MM MISC; 1 Package by Does not apply route 2 (two) times daily.  Dispense: 100 each; Refill: 0 - liraglutide (VICTOZA) 18 MG/3ML SOPN; Inject 1.2 mg into the skin daily.  Dispense: 6 mL; Refill: 0  3. At risk for osteoporosis Adriana Moon was given approximately 15 minutes of osteoporosis prevention counseling today. Adriana Moon is at risk for osteopenia and osteoporosis due to her Vitamin D deficiency. She was encouraged to take her Vitamin D and follow her higher calcium diet and increase strengthening exercise to help strengthen her bones and decrease her risk of osteopenia and osteoporosis.  Repetitive spaced learning was employed today to elicit superior memory formation and behavioral change.  4. Class 2 severe obesity with serious comorbidity and body mass index (BMI) of 37.0 to 37.9 in adult, unspecified obesity type (HCC) Adriana Moon is currently in the action stage of change. As such, her goal is to continue with weight loss efforts. She has agreed to following a lower carbohydrate, vegetable and lean protein rich diet plan.   Exercise goals: No exercise has been prescribed at this time.  Behavioral modification strategies: increasing lean protein intake, meal planning and cooking strategies, keeping healthy foods in the home and planning for success.  Adriana Moon has agreed to follow-up with our clinic in 2-3 weeks. She was informed of the importance of frequent follow-up visits to maximize her success with intensive lifestyle modifications for her multiple health conditions.   Objective:   Blood pressure 110/78,  pulse 92, temperature 97.8 F (36.6 C), temperature source Oral, height 5\' 6"  (1.676 m), weight 232 lb (105.2 kg), last menstrual period 05/24/2020, SpO2 99 %. Body mass index is 37.45 kg/m.  General: Cooperative, alert, well developed, in no acute distress. HEENT: Conjunctivae and lids unremarkable. Cardiovascular: Regular rhythm.  Lungs:  Normal work of breathing. Neurologic: No focal deficits.   Lab Results  Component Value Date   CREATININE 1.18 02/18/2020   BUN 12 02/18/2020   NA 141 02/18/2020   K 3.5 02/18/2020   CL 107 02/18/2020   CO2 27 02/18/2020   Lab Results  Component Value Date   ALT 7 04/28/2019   AST 7 04/28/2019   ALKPHOS 38 (L) 04/28/2019   BILITOT 0.5 04/28/2019   Lab Results  Component Value Date   HGBA1C 5.4 04/28/2019   HGBA1C 5.5 04/20/2016   Lab Results  Component Value Date   INSULIN 19.4 04/28/2019   Lab Results  Component Value Date   TSH 2.630 04/28/2019   Lab Results  Component Value Date   CHOL 170 04/28/2019   HDL 80 04/28/2019   LDLCALC 80 04/28/2019   TRIG 47 04/28/2019   CHOLHDL 2.3 05/08/2017   Lab Results  Component Value Date   WBC 7.5 04/28/2019   HGB 12.5 04/28/2019   HCT 37.8 04/28/2019   MCV 86 04/28/2019   PLT 338 04/28/2019   Lab Results  Component Value Date   IRON 82 04/28/2019   TIBC 315 04/28/2019   FERRITIN 153 (H) 04/28/2019    Attestation Statements:   Reviewed by clinician on day of visit: allergies, medications, problem list, medical history, surgical history, family history, social history, and previous encounter notes.  Adriana Moon, am acting as transcriptionist for Coralie Common, MD.  I have reviewed the above documentation for accuracy and completeness, and I agree with the above. - Adriana Blossom, MD

## 2020-06-06 ENCOUNTER — Telehealth: Payer: Self-pay | Admitting: Podiatry

## 2020-06-06 NOTE — Telephone Encounter (Signed)
I called patient to speak with her about her Return to work date of 06/08/2020. Adriana Moon, stated that, she still has not had her MRI. She is still having the same pain, no change. Please advise on RTW plan. And Patient would like a call about, options

## 2020-06-07 ENCOUNTER — Encounter: Payer: Self-pay | Admitting: Podiatry

## 2020-06-14 ENCOUNTER — Telehealth: Payer: Self-pay | Admitting: Podiatry

## 2020-06-14 ENCOUNTER — Other Ambulatory Visit: Payer: Self-pay

## 2020-06-14 ENCOUNTER — Telehealth (INDEPENDENT_AMBULATORY_CARE_PROVIDER_SITE_OTHER): Payer: BC Managed Care – PPO | Admitting: Family Medicine

## 2020-06-14 ENCOUNTER — Encounter (INDEPENDENT_AMBULATORY_CARE_PROVIDER_SITE_OTHER): Payer: Self-pay | Admitting: Family Medicine

## 2020-06-14 DIAGNOSIS — F3289 Other specified depressive episodes: Secondary | ICD-10-CM

## 2020-06-14 DIAGNOSIS — F32A Depression, unspecified: Secondary | ICD-10-CM | POA: Diagnosis not present

## 2020-06-14 DIAGNOSIS — Z6837 Body mass index (BMI) 37.0-37.9, adult: Secondary | ICD-10-CM

## 2020-06-14 DIAGNOSIS — F419 Anxiety disorder, unspecified: Secondary | ICD-10-CM

## 2020-06-14 DIAGNOSIS — E559 Vitamin D deficiency, unspecified: Secondary | ICD-10-CM

## 2020-06-14 MED ORDER — ESCITALOPRAM OXALATE 10 MG PO TABS: 20.0000 mg | ORAL_TABLET | Freq: Every day | ORAL | 0 refills | Status: DC

## 2020-06-16 NOTE — Progress Notes (Signed)
TeleHealth Visit:  Due to the COVID-19 pandemic, this visit was completed with telemedicine (audio/video) technology to reduce patient and provider exposure as well as to preserve personal protective equipment.   Adriana Moon has verbally consented to this TeleHealth visit. The patient is located at home, the provider is located at the [facility] office. The participants in this visit include the listed provider and patient. The visit was conducted today via video.  Chief Complaint: OBESITY Adriana Moon is here to discuss her progress with her obesity treatment plan along with follow-up of her obesity related diagnoses. Adriana Moon is on following a lower carbohydrate, vegetable and lean protein rich diet plan and states she is following her eating plan approximately 70% of the time. Adriana Moon states she is doing 0 minutes 0 times per week.  Today's visit was #: 18 Starting weight: 260 lbs Starting date: 04/28/2019  Interim History: Pt's friend granddaughter is upset this AM-> she is doing a virtual visit today. 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 She has been testing with ketosis strips to see if she continues to stay in ketosis. She feels like she needs to go numerous days (upward of 90 days) without sweets. She is still awaiting further workup for her feet. She feels she has gained about 2 lbs.  Subjective:   1. Anxiety and depression Pt's symptoms are slightly better controlled but pt is struggling with depression still.  2. Vitamin D deficiency Pt just found Vit D again. She reports fatigue.  Assessment/Plan:   1. Anxiety and depression Behavior modification techniques were discussed today to help Adriana Moon deal with her anxiety.  Orders and follow up as documented in patient record. Behavior modification techniques were discussed today to help Adriana Moon deal with her emotional/non-hunger eating behaviors.  Orders and follow up as documented  in patient record. Increase Lexapro to 20 mg daily and will follow up on symptoms at next appt.  - escitalopram (LEXAPRO) 10 MG tablet; Take 2 tablets (20 mg total) by mouth daily.  Dispense: 90 tablet; Refill: 0  2. Vitamin D deficiency Low Vitamin D level contributes to fatigue and are associated with obesity, breast, and colon cancer. She agrees to continue to take prescription Vitamin D @50 ,000 IU every week and will follow-up for routine testing of Vitamin D, at least 2-3 times per year to avoid over-replacement.  3. Class 2 severe obesity with serious comorbidity and body mass index (BMI) of 37.0 to 37.9 in adult, unspecified obesity type (HCC) Adriana Moon is currently in the action stage of change. As such, her goal is to continue with weight loss efforts. She has agreed to following a lower carbohydrate, vegetable and lean protein rich diet plan.   Exercise goals: No exercise has been prescribed at this time.  Behavioral modification strategies: increasing lean protein intake, meal planning and cooking strategies, keeping healthy foods in the home and planning for success.  Adriana Moon has agreed to follow-up with our clinic in 2 weeks. She was informed of the importance of frequent follow-up visits to maximize her success with intensive lifestyle modifications for her multiple health conditions.  Objective:   VITALS: Per patient if applicable, see vitals. GENERAL: Alert and in no acute distress. CARDIOPULMONARY: No increased WOB. Speaking in clear sentences.  PSYCH: Pleasant and cooperative. Speech normal rate and rhythm. Affect is appropriate. Insight and judgement are appropriate. Attention is focused, linear, and appropriate.  NEURO: Oriented as arrived to appointment on  time with no prompting.   Lab Results  Component Value Date   CREATININE 1.18 02/18/2020   BUN 12 02/18/2020   NA 141 02/18/2020   K 3.5 02/18/2020   CL 107 02/18/2020   CO2 27 02/18/2020   Lab Results   Component Value Date   ALT 7 04/28/2019   AST 7 04/28/2019   ALKPHOS 38 (L) 04/28/2019   BILITOT 0.5 04/28/2019   Lab Results  Component Value Date   HGBA1C 5.4 04/28/2019   HGBA1C 5.5 04/20/2016   Lab Results  Component Value Date   INSULIN 19.4 04/28/2019   Lab Results  Component Value Date   TSH 2.630 04/28/2019   Lab Results  Component Value Date   CHOL 170 04/28/2019   HDL 80 04/28/2019   LDLCALC 80 04/28/2019   TRIG 47 04/28/2019   CHOLHDL 2.3 05/08/2017   Lab Results  Component Value Date   WBC 7.5 04/28/2019   HGB 12.5 04/28/2019   HCT 37.8 04/28/2019   MCV 86 04/28/2019   PLT 338 04/28/2019   Lab Results  Component Value Date   IRON 82 04/28/2019   TIBC 315 04/28/2019   FERRITIN 153 (H) 04/28/2019    Attestation Statements:   Reviewed by clinician on day of visit: allergies, medications, problem list, medical history, surgical history, family history, social history, and previous encounter notes.  Coral Ceo, am acting as transcriptionist for Coralie Common, MD.   I have reviewed the above documentation for accuracy and completeness, and I agree with the above. - Jinny Blossom, MD

## 2020-06-27 ENCOUNTER — Ambulatory Visit (INDEPENDENT_AMBULATORY_CARE_PROVIDER_SITE_OTHER): Payer: BC Managed Care – PPO | Admitting: Podiatry

## 2020-06-27 ENCOUNTER — Other Ambulatory Visit: Payer: Self-pay

## 2020-06-27 ENCOUNTER — Encounter: Payer: Self-pay | Admitting: Podiatry

## 2020-06-27 DIAGNOSIS — M79671 Pain in right foot: Secondary | ICD-10-CM

## 2020-06-27 DIAGNOSIS — M722 Plantar fascial fibromatosis: Secondary | ICD-10-CM | POA: Diagnosis not present

## 2020-06-27 DIAGNOSIS — M79672 Pain in left foot: Secondary | ICD-10-CM

## 2020-06-28 ENCOUNTER — Telehealth: Payer: Self-pay | Admitting: Podiatry

## 2020-06-28 ENCOUNTER — Ambulatory Visit (INDEPENDENT_AMBULATORY_CARE_PROVIDER_SITE_OTHER): Payer: BC Managed Care – PPO | Admitting: Family Medicine

## 2020-06-28 NOTE — Telephone Encounter (Signed)
Please put in notes for patient, she said that you wanted to Extend her FMLA for about 3-4 weeks, since she has been having a problem getting her MRI. Please advise?

## 2020-06-29 ENCOUNTER — Encounter: Payer: Self-pay | Admitting: Podiatry

## 2020-07-04 ENCOUNTER — Telehealth: Payer: Self-pay | Admitting: Podiatry

## 2020-07-04 NOTE — Telephone Encounter (Signed)
Good Morning, Adriana Moon called again to have notes sent to her [place], but I still don't see any notes for 06/27/2020, office visit.

## 2020-07-05 NOTE — Telephone Encounter (Signed)
Dr. Amalia Hailey, please complete this note.

## 2020-07-10 NOTE — Progress Notes (Signed)
   Subjective:  Patient  PMHx morbid obesity presents today status post EPF bilateral. DOS: 10/22/2019.  Patient continues to be frustrated because she is still having pain to her bilateral feet.  She states that it is very severe.  She is requesting an MRI to be placed today.  She presents for further treatment and evaluation  Past Medical History:  Diagnosis Date  . Angio-edema   . Anxiety    manages naturally  . Arthritis of both knees   . Asthma   . Bilateral swelling of feet   . Breast cancer ([place])    twice  . Depression    pt. reports that she tends to get down & has had Lexapro in the past but its been taken off her list for now.   . Family history of breast cancer   . Family history of kidney cancer   . Family history of lung cancer   . Family history of prostate cancer   . GERD (gastroesophageal reflux disease)   . IBS (irritable bowel syndrome)   . Insulin resistance   . Joint pain   . Kidney failure    stage 1  . Lactose intolerance   . Migraines   . PCOS (polycystic ovarian syndrome)   . PCOS (polycystic ovarian syndrome)   . Urticaria   . Varicose veins of legs      Objective: Physical Exam General: The patient is alert and oriented x3 in no acute distress.  Dermatology: Skin is cool, dry and supple bilateral lower extremities. Negative for open lesions or macerations.  Vascular: Palpable pedal pulses bilaterally. No edema or erythema noted. Capillary refill within normal limits.  Neurological: Epicritic and protective threshold grossly intact bilaterally.   Musculoskeletal Exam: All pedal and ankle joints range of motion within normal limits bilateral. Muscle strength 5/5 in all groups bilateral. Chronic severe foot pain noted to the bilateral feet. Most significantly along the arches of the feet and 5th metatarsal tubercles bilateral. There is no significant edema noted.   Assessment: 1. s/p EPF bilateral. DOS: 10/22/2019 2. Chronic severe foot pain  bilateral  Plan of Care:  1. Patient was evaluated.   2. Patient is currently having her custom orthotics adjusted. Currently not available for pickup. Adjustment is pending/processing 3.  Continue OTC insoles.  Patient states that the custom orthotics are uncomfortable  4. MRI reordered today. MRI bilateral feet without contrast. 5. Return to clinic after MRI to review results and discuss further possible treatment options 6.  Note for work was provided today.   *Works at [employer], DPM [facility]  Dr[address] Edrick Kins, [address]                                        [place], [place] [address]                Office [phone]  Fax [phone]

## 2020-07-13 ENCOUNTER — Ambulatory Visit (INDEPENDENT_AMBULATORY_CARE_PROVIDER_SITE_OTHER): Payer: BC Managed Care – PPO | Admitting: Family Medicine

## 2020-07-13 ENCOUNTER — Encounter (INDEPENDENT_AMBULATORY_CARE_PROVIDER_SITE_OTHER): Payer: Self-pay

## 2020-07-19 ENCOUNTER — Encounter: Payer: Self-pay | Admitting: Plastic Surgery

## 2020-07-19 ENCOUNTER — Other Ambulatory Visit: Payer: Self-pay

## 2020-07-19 ENCOUNTER — Ambulatory Visit (INDEPENDENT_AMBULATORY_CARE_PROVIDER_SITE_OTHER): Payer: BC Managed Care – PPO | Admitting: Plastic Surgery

## 2020-07-19 VITALS — BP 115/83 | HR 74

## 2020-07-19 DIAGNOSIS — Z9013 Acquired absence of bilateral breasts and nipples: Secondary | ICD-10-CM

## 2020-07-19 NOTE — Progress Notes (Signed)
   Subjective:    Patient ID: Adriana Moon, female    DOB: [birth year], 40 y.o.   MRN: [id number]  The patient is a 40 year old female here for follow-up after undergoing bilateral breast reconstruction for breast cancer.  She opted for implant-based reconstruction.  Overall she is happy and doing really well.  Her implants are in place there does not appear to be any sign of hematoma or seroma.  There are no lumps or bumps noted.  The patient might be interested in lipoma willing in the future.  She is okay for right now.  She has Mentor 535 cc implants in place which was done in 03/2019.   Review of Systems  Constitutional: Negative.   Eyes: Negative.   Respiratory: Negative.   Cardiovascular: Negative.   Gastrointestinal: Negative.   Endocrine: Negative.   Genitourinary: Negative.   Musculoskeletal: Negative.        Objective:   Physical Exam Vitals and nursing note reviewed.  Constitutional:      Appearance: Normal appearance.  HENT:     Head: Atraumatic.  Cardiovascular:     Rate and Rhythm: Normal rate.     Pulses: Normal pulses.  Pulmonary:     Effort: Pulmonary effort is normal. No respiratory distress.     Breath sounds: No stridor. No wheezing.  Skin:    General: Skin is warm.     Coloration: Skin is not jaundiced.     Findings: No bruising.  Neurological:     General: No focal deficit present.     Mental Status: She is alert. Mental status is at baseline.  Psychiatric:        Mood and Affect: Mood normal.        Behavior: Behavior normal.        Thought Content: Thought content normal.           Assessment & Plan:     ICD-10-CM   1. Acquired absence of both breasts  Z90.13       She would like to move ahead with nipple areola tattooing.  I will get this set up for her.   Pictures were obtained of the patient and placed in the chart with the patient's or guardian's permission.

## 2020-07-21 ENCOUNTER — Telehealth: Payer: Self-pay | Admitting: Podiatry

## 2020-07-21 NOTE — Telephone Encounter (Signed)
A representative from patient insurance is calling on behalf of patient regarding prior authorization for MRI has requested someone return call, Please Advise  Kyla [phone] Ext. [phone]

## 2020-07-22 NOTE — Telephone Encounter (Signed)
Kyla, Representative from the following patients insurance has requested prior authorization for an MRI, would like return call regarding this matter also second attempt, Please Advise  Kyla [phone] Ext. [phone]

## 2020-07-25 ENCOUNTER — Ambulatory Visit: Payer: BC Managed Care – PPO | Admitting: Adult Health

## 2020-08-10 ENCOUNTER — Other Ambulatory Visit (INDEPENDENT_AMBULATORY_CARE_PROVIDER_SITE_OTHER): Payer: Self-pay | Admitting: Family Medicine

## 2020-08-10 DIAGNOSIS — F32A Depression, unspecified: Secondary | ICD-10-CM

## 2020-08-10 DIAGNOSIS — F419 Anxiety disorder, unspecified: Secondary | ICD-10-CM

## 2020-08-11 ENCOUNTER — Ambulatory Visit (INDEPENDENT_AMBULATORY_CARE_PROVIDER_SITE_OTHER): Payer: Self-pay | Admitting: Family Medicine

## 2020-08-15 ENCOUNTER — Encounter (INDEPENDENT_AMBULATORY_CARE_PROVIDER_SITE_OTHER): Payer: Self-pay

## 2020-08-27 ENCOUNTER — Other Ambulatory Visit: Payer: Self-pay

## 2020-08-29 ENCOUNTER — Other Ambulatory Visit: Payer: Self-pay | Admitting: Hematology and Oncology

## 2020-09-06 ENCOUNTER — Other Ambulatory Visit: Payer: Self-pay

## 2020-09-06 ENCOUNTER — Ambulatory Visit
Admission: RE | Admit: 2020-09-06 | Discharge: 2020-09-06 | Disposition: A | Discharge status: Home / Self Care | Payer: BLUE CROSS/BLUE SHIELD | Source: Ambulatory Visit | Attending: Podiatry | Admitting: Podiatry

## 2020-09-06 ENCOUNTER — Ambulatory Visit
Admission: RE | Admit: 2020-09-06 | Discharge: 2020-09-06 | Disposition: A | Discharge status: Home / Self Care | Payer: Medicaid Other | Source: Ambulatory Visit | Attending: Podiatry | Admitting: Podiatry

## 2020-09-06 DIAGNOSIS — M19071 Primary osteoarthritis, right ankle and foot: Secondary | ICD-10-CM | POA: Diagnosis not present

## 2020-09-06 DIAGNOSIS — M79672 Pain in left foot: Secondary | ICD-10-CM

## 2020-09-06 DIAGNOSIS — M79671 Pain in right foot: Secondary | ICD-10-CM | POA: Diagnosis not present

## 2020-09-06 DIAGNOSIS — M722 Plantar fascial fibromatosis: Secondary | ICD-10-CM

## 2020-09-06 DIAGNOSIS — Z9889 Other specified postprocedural states: Secondary | ICD-10-CM | POA: Diagnosis not present

## 2020-09-22 ENCOUNTER — Ambulatory Visit: Payer: BC Managed Care – PPO

## 2020-09-28 ENCOUNTER — Other Ambulatory Visit: Payer: Self-pay | Admitting: Otolaryngology

## 2020-09-29 ENCOUNTER — Ambulatory Visit (INDEPENDENT_AMBULATORY_CARE_PROVIDER_SITE_OTHER): Payer: 59

## 2020-09-29 ENCOUNTER — Other Ambulatory Visit: Payer: Self-pay

## 2020-09-29 VITALS — BP 128/86 | HR 78 | Temp 97.7°F | Ht 66.0 in | Wt 255.0 lb

## 2020-09-29 DIAGNOSIS — Z9013 Acquired absence of bilateral breasts and nipples: Secondary | ICD-10-CM

## 2020-10-03 ENCOUNTER — Ambulatory Visit (INDEPENDENT_AMBULATORY_CARE_PROVIDER_SITE_OTHER): Payer: BLUE CROSS/BLUE SHIELD | Admitting: Podiatry

## 2020-10-03 ENCOUNTER — Other Ambulatory Visit: Payer: Self-pay

## 2020-10-03 DIAGNOSIS — M659 Synovitis and tenosynovitis, unspecified: Secondary | ICD-10-CM | POA: Diagnosis not present

## 2020-10-03 DIAGNOSIS — M722 Plantar fascial fibromatosis: Secondary | ICD-10-CM

## 2020-10-03 MED ORDER — CELECOXIB 100 MG PO CAPS: 100.0000 mg | ORAL_CAPSULE | Freq: Two times a day (BID) | ORAL | 1 refills | Status: DC

## 2020-10-03 MED ORDER — METHYLPREDNISOLONE 4 MG PO TBPK: ORAL_TABLET | ORAL | 0 refills | Status: DC

## 2020-10-06 ENCOUNTER — Other Ambulatory Visit: Payer: Self-pay

## 2020-10-06 ENCOUNTER — Ambulatory Visit: Payer: Medicaid Other

## 2020-10-06 ENCOUNTER — Encounter (HOSPITAL_COMMUNITY): Payer: Self-pay | Admitting: Otolaryngology

## 2020-10-06 NOTE — Progress Notes (Signed)
PCP - [place]  Cardiologist - Denies  Chest x-ray - Denies  EKG - Denies  Stress Test - Denies  ECHO - Denies  Cardiac Cath - Denies  AICD-na PM-na LOOP-na  Sleep Study - Denies CPAP - Denies  LABS- 10/11/20: CBC, BMP, POC UPreg  ASA- Denies  ERAS- No  HA1C- Denies  Pt states she does not have a history of diabetes and she takes metformin for PCOS and victoza for weight loss.  Anesthesia- No  Pt denies having chest pain, sob, or fever during the pre-op phone call. All instructions explained to the pt, with a verbal understanding of the material including: as of today, stop taking all Aspirin (unless instructed by your doctor) and Other Aspirin containing products, Vitamins, Fish oils, and Herbal medications. Also stop all NSAIDS i.e. Advil, Ibuprofen, Motrin, Aleve, Anaprox, Naproxen, BC, Goody Powders, and all Supplements.  Pt also instructed to wear a mask and social distance if she has to go out prior to her surgery. The opportunity to ask questions was provided.    Coronavirus Screening  Have you experienced the following symptoms:  Cough yes/no: No Fever (>100.15F)  yes/no: No Runny nose yes/no: No Sore throat yes/no: No Difficulty breathing/shortness of breath  yes/no: No  Have you or a family member traveled in the last 14 days and where? yes/no: No   If the patient indicates "YES" to the above questions, their PAT will be rescheduled to limit the exposure to others and, the surgeon will be notified. THE PATIENT WILL NEED TO BE ASYMPTOMATIC FOR 14 DAYS.   If the patient is not experiencing any of these symptoms, the PAT nurse will instruct them to NOT bring anyone with them to their appointment since they may have these symptoms or traveled as well.   Please remind your patients and families that hospital visitation restrictions are in effect and the importance of the restrictions.

## 2020-10-10 DIAGNOSIS — M659 Synovitis and tenosynovitis, unspecified: Secondary | ICD-10-CM

## 2020-10-10 MED ORDER — BETAMETHASONE SOD PHOS & ACET 6 (3-3) MG/ML IJ SUSP
3.0000 mg | Freq: Once | INTRAMUSCULAR | Status: AC
Start: 2020-10-10 — End: 2020-10-10
  Administered 2020-10-10: 3 mg via INTRA_ARTICULAR

## 2020-10-10 NOTE — Progress Notes (Signed)
Subjective:  Patient  PMHx morbid obesity presents today status post EPF bilateral. DOS: 10/22/2019.  Patient continues to have some pain and tenderness to the bilateral feet.  She states that currently her right ankle has been very painful for the last few months.  She has noticed an increase of pain perhaps due to compensation.  She presents for further treatment evaluation  Past Medical History:  Diagnosis Date   Angio-edema    Anxiety    manages naturally   Arthritis of both knees    Asthma    Bilateral swelling of feet    Breast cancer (HCC)    twice   Depression    pt. reports that she tends to get down & has had Lexapro in the past but its been taken off her list for now.    Family history of breast cancer    Family history of kidney cancer    Family history of lung cancer    Family history of prostate cancer    GERD (gastroesophageal reflux disease)    IBS (irritable bowel syndrome)    Insulin resistance    Joint pain    Kidney failure    stage 1   Lactose intolerance    Migraines    PCOS (polycystic ovarian syndrome)    PCOS (polycystic ovarian syndrome)    Urticaria    Varicose veins of legs      Objective: Physical Exam General: The patient is alert and oriented x3 in no acute distress.  Dermatology: Skin is cool, dry and supple bilateral lower extremities. Negative for open lesions or macerations.  Vascular: Palpable pedal pulses bilaterally. No edema or erythema noted. Capillary refill within normal limits.  Neurological: Epicritic and protective threshold grossly intact bilaterally.   Musculoskeletal Exam: All pedal and ankle joints range of motion within normal limits bilateral. Muscle strength 5/5 in all groups bilateral. Chronic severe foot pain noted to the bilateral feet. Most significantly along the arches of the feet and 5th metatarsal tubercles bilateral. There is no significant edema noted.  Pain on palpation noted to the right ankle joint.  No  crepitus noted.  MRI RT foot wo contrast 09/06/2020: IMPRESSION: 1. No acute abnormality. 2. Mild tibiotalar osteoarthritis. 3. Chronic postsurgical changes of the proximal plantar fascia without tear or acute inflammation.  MRI LT foot wo contrast 09/06/2020: IMPRESSION: 1. No acute abnormality. 2. Chronic postsurgical changes of the proximal plantar fascia without tear or acute inflammation   Assessment: 1. s/p EPF bilateral. DOS: 10/22/2019 2. Chronic severe foot pain bilateral 3.  Synovitis of right ankle  Plan of Care:  1. Patient was evaluated.   2.  Continue custom molded orthotics 3.  Prescription for a Medrol Dosepak. 4.  Prescription for Celebrex 100 mg 2 times daily to initiate after completion of the Dosepak 5.  Injection of 0.5 cc Celestone Soluspan injection of the right ankle 6.  Return to clinic as needed   *No longer working at [employer], DPM [facility]  Dr. Edrick Kins, [address]                                        [place], [place] [address]                Office [phone]  Fax [phone]

## 2020-10-11 ENCOUNTER — Encounter (HOSPITAL_COMMUNITY)
Admission: RE | Disposition: A | Discharge status: Home / Self Care | Payer: Self-pay | Source: Home / Self Care | Attending: Otolaryngology

## 2020-10-11 ENCOUNTER — Encounter (HOSPITAL_COMMUNITY): Payer: Self-pay | Admitting: Otolaryngology

## 2020-10-11 ENCOUNTER — Ambulatory Visit (HOSPITAL_COMMUNITY): Payer: 59 | Admitting: Anesthesiology

## 2020-10-11 ENCOUNTER — Ambulatory Visit (HOSPITAL_COMMUNITY)
Admission: RE | Admit: 2020-10-11 | Discharge: 2020-10-11 | Disposition: A | Discharge status: Home / Self Care | Payer: 59 | Attending: Otolaryngology | Admitting: Otolaryngology

## 2020-10-11 DIAGNOSIS — J353 Hypertrophy of tonsils with hypertrophy of adenoids: Secondary | ICD-10-CM | POA: Diagnosis present

## 2020-10-11 DIAGNOSIS — Z833 Family history of diabetes mellitus: Secondary | ICD-10-CM | POA: Diagnosis not present

## 2020-10-11 DIAGNOSIS — Z853 Personal history of malignant neoplasm of breast: Secondary | ICD-10-CM | POA: Diagnosis not present

## 2020-10-11 DIAGNOSIS — J3501 Chronic tonsillitis: Secondary | ICD-10-CM | POA: Insufficient documentation

## 2020-10-11 HISTORY — PX: TONSILLECTOMY AND ADENOIDECTOMY: SHX28

## 2020-10-11 LAB — BASIC METABOLIC PANEL
Anion gap: 13 (ref 5–15)
BUN: 23 mg/dL — ABNORMAL HIGH (ref 6–20)
CO2: 24 mmol/L (ref 22–32)
Calcium: 9.4 mg/dL (ref 8.9–10.3)
Chloride: 102 mmol/L (ref 98–111)
Creatinine, Ser: 1.1 mg/dL — ABNORMAL HIGH (ref 0.44–1.00)
GFR, Estimated: >60 mL/min (ref >60)
Glucose, Bld: 95 mg/dL (ref 70–99)
Potassium: 4.1 mmol/L (ref 3.5–5.1)
Sodium: 139 mmol/L (ref 135–145)

## 2020-10-11 LAB — CBC
HCT: 40 % (ref 36.0–46.0)
Hemoglobin: 12.8 g/dL (ref 12.0–15.0)
MCH: 28.3 pg (ref 26.0–34.0)
MCHC: 32 g/dL (ref 30.0–36.0)
MCV: 88.5 fL (ref 80.0–100.0)
Platelets: 334 10*3/uL (ref 150–400)
RBC: 4.52 MIL/uL (ref 3.87–5.11)
RDW: 13.3 % (ref 11.5–15.5)
WBC: 5.3 10*3/uL (ref 4.0–10.5)
nRBC: 0 % (ref 0.0–0.2)

## 2020-10-11 SURGERY — TONSILLECTOMY AND ADENOIDECTOMY
Anesthesia: General

## 2020-10-11 MED ORDER — CHLORHEXIDINE GLUCONATE 0.12 % MT SOLN: 15.0000 mL | Freq: Once | OROMUCOSAL | Status: DC

## 2020-10-11 MED ORDER — ORAL CARE MOUTH RINSE: 15.0000 mL | Freq: Once | OROMUCOSAL | Status: DC

## 2020-10-11 MED ORDER — DEXMEDETOMIDINE (PRECEDEX) IN NS 20 MCG/5ML (4 MCG/ML) IV SYRINGE
PREFILLED_SYRINGE | INTRAVENOUS | Status: AC
  Filled 2020-10-11: qty 5

## 2020-10-11 MED ORDER — SODIUM CHLORIDE 0.9 % IR SOLN
Status: DC | PRN
  Administered 2020-10-11: 1000 mL

## 2020-10-11 MED ORDER — SUCCINYLCHOLINE CHLORIDE 200 MG/10ML IV SOSY
PREFILLED_SYRINGE | INTRAVENOUS | Status: DC | PRN
  Administered 2020-10-11: 160 mg via INTRAVENOUS

## 2020-10-11 MED ORDER — CHLORHEXIDINE GLUCONATE 0.12 % MT SOLN
OROMUCOSAL | Status: AC
  Filled 2020-10-11: qty 15

## 2020-10-11 MED ORDER — LIDOCAINE HCL (CARDIAC) PF 100 MG/5ML IV SOSY
PREFILLED_SYRINGE | INTRAVENOUS | Status: DC | PRN
  Administered 2020-10-11: 50 mg via INTRAVENOUS

## 2020-10-11 MED ORDER — SUCCINYLCHOLINE CHLORIDE 200 MG/10ML IV SOSY
PREFILLED_SYRINGE | INTRAVENOUS | Status: AC
  Filled 2020-10-11: qty 10

## 2020-10-11 MED ORDER — ACETAMINOPHEN 500 MG PO TABS
1000.0000 mg | ORAL_TABLET | Freq: Once | ORAL | Status: AC
  Administered 2020-10-11: 1000 mg via ORAL
  Filled 2020-10-11: qty 2

## 2020-10-11 MED ORDER — HYDROMORPHONE HCL 2 MG PO TABS: 2.0000 mg | ORAL_TABLET | Freq: Once | ORAL | Status: DC

## 2020-10-11 MED ORDER — SUGAMMADEX SODIUM 500 MG/5ML IV SOLN
INTRAVENOUS | Status: DC | PRN
  Administered 2020-10-11: 500 mg via INTRAVENOUS

## 2020-10-11 MED ORDER — PROMETHAZINE HCL 25 MG/ML IJ SOLN: 6.2500 mg | INTRAMUSCULAR | Status: DC | PRN

## 2020-10-11 MED ORDER — LIDOCAINE HCL (PF) 2 % IJ SOLN
INTRAMUSCULAR | Status: AC
  Filled 2020-10-11: qty 5

## 2020-10-11 MED ORDER — LACTATED RINGERS IV SOLN: INTRAVENOUS | Status: DC

## 2020-10-11 MED ORDER — ROCURONIUM BROMIDE 100 MG/10ML IV SOLN
INTRAVENOUS | Status: DC | PRN
  Administered 2020-10-11: 40 mg via INTRAVENOUS

## 2020-10-11 MED ORDER — DEXAMETHASONE SODIUM PHOSPHATE 10 MG/ML IJ SOLN
INTRAMUSCULAR | Status: AC
  Filled 2020-10-11: qty 2

## 2020-10-11 MED ORDER — AMISULPRIDE (ANTIEMETIC) 5 MG/2ML IV SOLN
INTRAVENOUS | Status: AC
  Filled 2020-10-11: qty 4

## 2020-10-11 MED ORDER — SCOPOLAMINE 1 MG/3DAYS TD PT72
1.0000 | MEDICATED_PATCH | TRANSDERMAL | Status: DC
  Administered 2020-10-11: 1.5 mg via TRANSDERMAL
  Filled 2020-10-11: qty 1

## 2020-10-11 MED ORDER — AMISULPRIDE (ANTIEMETIC) 5 MG/2ML IV SOLN
10.0000 mg | Freq: Once | INTRAVENOUS | Status: AC | PRN
Start: 2020-10-11 — End: 2020-10-11
  Administered 2020-10-11: 10 mg via INTRAVENOUS

## 2020-10-11 MED ORDER — MIDAZOLAM HCL 5 MG/5ML IJ SOLN
INTRAMUSCULAR | Status: DC | PRN
  Administered 2020-10-11: 2 mg via INTRAVENOUS

## 2020-10-11 MED ORDER — PROPOFOL 10 MG/ML IV BOLUS
INTRAVENOUS | Status: DC | PRN
  Administered 2020-10-11: 200 mg via INTRAVENOUS
  Administered 2020-10-11: 50 mg via INTRAVENOUS

## 2020-10-11 MED ORDER — OXYMETAZOLINE HCL 0.05 % NA SOLN
NASAL | Status: DC | PRN
  Administered 2020-10-11: 1 via TOPICAL

## 2020-10-11 MED ORDER — PHENYLEPHRINE HCL-NACL 10-0.9 MG/250ML-% IV SOLN
INTRAVENOUS | Status: AC
  Filled 2020-10-11: qty 500

## 2020-10-11 MED ORDER — FENTANYL CITRATE (PF) 100 MCG/2ML IJ SOLN
INTRAMUSCULAR | Status: DC | PRN
  Administered 2020-10-11 (×3): 50 ug via INTRAVENOUS
  Administered 2020-10-11: 100 ug via INTRAVENOUS

## 2020-10-11 MED ORDER — ONDANSETRON HCL 4 MG/2ML IJ SOLN
INTRAMUSCULAR | Status: AC
  Filled 2020-10-11: qty 2

## 2020-10-11 MED ORDER — HYDROMORPHONE HCL 1 MG/ML IJ SOLN
0.2500 mg | INTRAMUSCULAR | Status: DC | PRN
  Administered 2020-10-11 (×2): 0.5 mg via INTRAVENOUS

## 2020-10-11 MED ORDER — MIDAZOLAM HCL 2 MG/2ML IJ SOLN
INTRAMUSCULAR | Status: AC
  Filled 2020-10-11: qty 2

## 2020-10-11 MED ORDER — ROCURONIUM BROMIDE 10 MG/ML (PF) SYRINGE
PREFILLED_SYRINGE | INTRAVENOUS | Status: AC
  Filled 2020-10-11: qty 10

## 2020-10-11 MED ORDER — ONDANSETRON HCL 4 MG/2ML IJ SOLN
INTRAMUSCULAR | Status: DC | PRN
  Administered 2020-10-11: 4 mg via INTRAVENOUS

## 2020-10-11 MED ORDER — HYDROMORPHONE HCL 4 MG PO TABS: 4.0000 mg | ORAL_TABLET | ORAL | 0 refills | Status: AC | PRN

## 2020-10-11 MED ORDER — AMOXICILLIN 400 MG/5ML PO SUSR: 800.0000 mg | Freq: Two times a day (BID) | ORAL | 0 refills | Status: AC

## 2020-10-11 MED ORDER — SUGAMMADEX SODIUM 500 MG/5ML IV SOLN
INTRAVENOUS | Status: AC
  Filled 2020-10-11: qty 5

## 2020-10-11 MED ORDER — OXYMETAZOLINE HCL 0.05 % NA SOLN
NASAL | Status: AC
  Filled 2020-10-11: qty 30

## 2020-10-11 MED ORDER — HYDROMORPHONE HCL 1 MG/ML IJ SOLN
INTRAMUSCULAR | Status: AC
  Filled 2020-10-11: qty 1

## 2020-10-11 MED ORDER — DEXAMETHASONE SODIUM PHOSPHATE 10 MG/ML IJ SOLN
INTRAMUSCULAR | Status: DC | PRN
  Administered 2020-10-11: 20 mg via INTRAVENOUS

## 2020-10-11 MED ORDER — 0.9 % SODIUM CHLORIDE (POUR BTL) OPTIME
TOPICAL | Status: DC | PRN
  Administered 2020-10-11: 1000 mL

## 2020-10-11 MED ORDER — DEXMEDETOMIDINE (PRECEDEX) IN NS 20 MCG/5ML (4 MCG/ML) IV SYRINGE
PREFILLED_SYRINGE | INTRAVENOUS | Status: DC | PRN
  Administered 2020-10-11: 20 ug via INTRAVENOUS

## 2020-10-11 MED ORDER — FENTANYL CITRATE (PF) 250 MCG/5ML IJ SOLN
INTRAMUSCULAR | Status: AC
  Filled 2020-10-11: qty 5

## 2020-10-11 SURGICAL SUPPLY — 23 items
CANISTER SUCT 3000ML PPV (MISCELLANEOUS) IMPLANT
CATH ROBINSON RED A/P 10FR (CATHETERS) IMPLANT
COAGULATOR SUCT SWTCH 10FR 6 (ELECTROSURGICAL) IMPLANT
COVER WAND RF STERILE (DRAPES) ×2 IMPLANT
ELECT REM PT RETURN 9FT ADLT (ELECTROSURGICAL)
ELECT REM PT RETURN 9FT PED (ELECTROSURGICAL)
ELECTRODE REM PT RETRN 9FT PED (ELECTROSURGICAL) IMPLANT
ELECTRODE REM PT RTRN 9FT ADLT (ELECTROSURGICAL) IMPLANT
GAUZE 4X4 16PLY RFD (DISPOSABLE) ×2 IMPLANT
GLOVE ECLIPSE 7.5 STRL STRAW (GLOVE) ×2 IMPLANT
GOWN STRL REUS W/ TWL LRG LVL3 (GOWN DISPOSABLE) ×2 IMPLANT
GOWN STRL REUS W/TWL LRG LVL3 (GOWN DISPOSABLE) ×2
KIT BASIN OR (CUSTOM PROCEDURE TRAY) ×2 IMPLANT
KIT TURNOVER KIT B (KITS) ×2 IMPLANT
MANIFOLD NEPTUNE II (INSTRUMENTS) ×2 IMPLANT
NS IRRIG 1000ML POUR BTL (IV SOLUTION) ×2 IMPLANT
PACK SURGICAL SETUP 50X90 (CUSTOM PROCEDURE TRAY) ×2 IMPLANT
SPONGE TONSIL TAPE 1 RFD (DISPOSABLE) ×2 IMPLANT
SYR BULB EAR ULCER 3OZ GRN STR (SYRINGE) ×2 IMPLANT
TOWEL GREEN STERILE FF (TOWEL DISPOSABLE) ×4 IMPLANT
TUBE CONNECTING 12X1/4 (SUCTIONS) ×2 IMPLANT
TUBE SALEM SUMP 16 FR W/ARV (TUBING) ×2 IMPLANT
WAND COBLATOR 70 EVAC XTRA (SURGICAL WAND) ×2 IMPLANT

## 2020-10-11 NOTE — Anesthesia Preprocedure Evaluation (Addendum)
Anesthesia Evaluation  Patient identified by MRN, date of birth, ID band Patient awake    Reviewed: Allergy & Precautions, NPO status , Patient's Chart, lab work & pertinent test results  Airway Mallampati: I  TM Distance: >3 FB Neck ROM: Full    Dental  (+) Teeth Intact, Dental Advisory Given, Chipped,    Pulmonary asthma (hasnt used rescue inhaler in years ) ,    Pulmonary exam normal breath sounds clear to auscultation       Cardiovascular negative cardio ROS Normal cardiovascular exam Rhythm:Regular Rate:Normal     Neuro/Psych  Headaches, PSYCHIATRIC DISORDERS Anxiety Depression    GI/Hepatic Neg liver ROS, GERD  Medicated and Controlled,  Endo/Other  diabetes, Well Controlled, Type 2, Insulin Dependent, Oral Hypoglycemic AgentsMorbid obesityBMI 60  Renal/GU negative Renal ROS  Female GU complaint     Musculoskeletal  (+) Arthritis , Osteoarthritis,    Abdominal (+) + obese,   Peds negative pediatric ROS (+)  Hematology negative hematology ROS (+)   Anesthesia Other Findings Enlarged tonsils and adenoids   Hx breast ca- restricted RUE  Reproductive/Obstetrics negative OB ROS                            Anesthesia Physical Anesthesia Plan  ASA: 3  Anesthesia Plan: General   Post-op Pain Management:    Induction: Intravenous  PONV Risk Score and Plan: 4 or greater and Ondansetron, Dexamethasone, Midazolam, Scopolamine patch - Pre-op and Treatment may vary due to age or medical condition  Airway Management Planned: Oral ETT  Additional Equipment: None  Intra-op Plan:   Post-operative Plan: Extubation in OR  Informed Consent: I have reviewed the patients History and Physical, chart, labs and discussed the procedure including the risks, benefits and alternatives for the proposed anesthesia with the patient or authorized representative who has indicated his/her understanding  and acceptance.     Dental advisory given  Plan Discussed with: CRNA  Anesthesia Plan Comments:         Anesthesia Quick Evaluation

## 2020-10-11 NOTE — Transfer of Care (Signed)
Immediate Anesthesia Transfer of Care Note  Patient: Adriana Moon  Procedure(s) Performed: TONSILLECTOMY AND ADENOIDECTOMY  Patient Location: PACU  Anesthesia Type:General  Level of Consciousness: awake, alert , oriented and patient cooperative  Airway & Oxygen Therapy: Patient Spontanous Breathing and Patient connected to face mask oxygen  Post-op Assessment: Report given to RN, Post -op Vital signs reviewed and stable and Patient moving all extremities X 4  Post vital signs: Reviewed and stable  Last Vitals:  Vitals Value Taken Time  BP 115/80 10/11/20 0915  Temp 36.7 C 10/11/20 0914  Pulse 94 10/11/20 0917  Resp 18 10/11/20 0917  SpO2 91 % 10/11/20 0917  Vitals shown include unvalidated device data.  Last Pain:  Vitals:   10/11/20 0914  TempSrc:   PainSc: Asleep      Patients Stated Pain Goal: 3 (44/69/50 7225)  Complications: No notable events documented.

## 2020-10-11 NOTE — Discharge Instructions (Addendum)
[facility] Postoperative Instructions for Tonsillectomy & Adenoidectomy (T&A) Activity Restrict activity at home for the first two days, resting as much as possible. Light indoor activity is best. You may usually return to school or work within a week but void strenuous activity and sports for two weeks. Sleep with your head elevated on 2-3 pillows for 3-4 days to help decrease swelling. Diet Due to tissue swelling and throat discomfort, you may have little desire to drink for several days. However fluids are very important to prevent dehydration. You will find that non-acidic juices, soups, popsicles, Jell-O, custard, puddings, and any soft or mashed foods taken in small quantities can be swallowed fairly easily. Try to increase your fluid and food intake as the discomfort subsides. It is recommended that a child receive 1-1/2 quarts of fluid in a 24-hour period. Adult require twice this amount.  Discomfort Your sore throat may be relieved by applying an ice collar to your neck and/or by taking Tylenol. You may experience an earache, which is due to referred pain from the throat. Referred ear pain is commonly felt at night when trying to rest.  Bleeding                        Although rare, there is risk of having some bleeding during the first 2 weeks after having a T&A. This usually happens between days 7-10 postoperatively. If you or your child should have any bleeding, try to remain calm. We recommend sitting up quietly in a chair and gently spitting out the blood into a bowl. For adults, gargling gently with ice water may help. If the bleeding does not stop after a short time (5 minutes), is more than 1 teaspoonful, or if you become worried, please call our office at [phone] or go directly to the nearest hospital emergency room. Do not eat or drink anything prior to going to the hospital as you may need to be taken to the operating room in order to control the bleeding. GENERAL  CONSIDERATIONS Brush your teeth regularly. Avoid mouthwashes and gargles for three weeks. You may gargle gently with warm salt-water as necessary or spray with Chloraseptic. You may make salt-water by placing 2 teaspoons of table salt into a quart of fresh water. Warm the salt-water in a microwave to a luke warm temperature.  Avoid exposure to colds and upper respiratory infections if possible.  If you look into a mirror or into your child's mouth, you will see white-gray patches in the back of the throat. This is normal after having a T&A and is like a scab that forms on the skin after an abrasion. It will disappear once the back of the throat heals completely. However, it may cause a noticeable odor; this too will disappear with time. Again, warm salt-water gargles may be used to help keep the throat clean and promote healing.  You may notice a temporary change in voice quality, such as a higher pitched voice or a nasal sound, until healing is complete. This may last for 1-2 weeks and should resolve.  Do not take or give you child any medications that we have not prescribed or recommended.  Snoring may occur, especially at night, for the first week after a T&A. It is due to swelling of the soft palate and will usually resolve.  Please call our office at [phone] if you have any questions.    

## 2020-10-11 NOTE — H&P (Signed)
Cc: Chronic sore throat  HPI: The patient is a 40 y/o female who presents today for evaluation of frequent tonsil stones, sore throat, and halitosis. The patient has noted issues with her tonsils for many years. However, the frequency of the stones and sore throat are increasing. She is symptomatic on a weekly basis. The patient is also concerned with halitosis. She has been using a waterpik to remove the stones but this causes bleeding at times. No tobacco use is noted. Previous ENT surgery is denied.    The patient's review of systems (constitutional, eyes, ENT, cardiovascular, respiratory, GI, musculoskeletal, skin, neurologic, psychiatric, endocrine, hematologic, allergic) is noted in the ROS questionnaire.  It is reviewed with the patient.   Family health history: Diabetes.  Major events: Cesarean sections x 2, endometrial ablation, lumpectomy, breast reduction and reconstruction, breast cancer.  Ongoing medical problems: GERD, arthritis in knees, breast cancer in 2009 and 2020, migraines, asthma, depression and anxiety.  Social history: The patient is single. She drinks alcohol socially. She denies the use of tobacco or illegal drugs..   Exam: General: Communicates without difficulty, well nourished, no acute distress. Head: Normocephalic, no evidence injury, no tenderness, facial buttresses intact without stepoff. Eyes: PERRL, EOMI. No scleral icterus, conjunctivae clear. Neuro: CN II exam reveals vision grossly intact. No nystagmus at any point of gaze. Ears: Auricles well formed without lesions. Ear canals are intact without mass or lesion. No erythema or edema is appreciated. The TMs are intact without fluid. Nose: External evaluation reveals normal support and skin without lesions. Dorsum is intact. Anterior rhinoscopy reveals congested and edematous mucosa over anterior aspect of the inferior turbinates and nasal septum. No purulence is noted. Oral:  Oral cavity and oropharynx are intact,  symmetric, without erythema or edema. Mucosa is moist without lesions. Tonsils 2+, cryptic. Neck: Full range of motion without pain. There is no significant lymphadenopathy. No masses palpable. Thyroid bed within normal limits to palpation. Parotid glands and submandibular glands equal bilaterally without mass. Trachea is midline. Neuro:  CN 2-12 grossly intact. Gait normal. Vestibular: No nystagmus at any point of gaze.   Assessment  The patient's history and physical exam findings are consistent with chronic tonsillitis/pharyngitis secondary to adenotonsillar hypertrophy. Tonsils are 2+ and cryptic.   Plan  1. The physical exam findings are reviewed with the patient.  2. The treatment options for her chronic tonsillitis/halitosis/tonsilloliths include the use of a water pik versus adenotonsillectomy. The risks, benefits, alternatives, and details of the procedure are reviewed with the patient. Questions are invited and answered. 3. The patient is interested in proceeding with the procedure.  We will schedule the procedure in accordance with the family schedule.

## 2020-10-11 NOTE — Op Note (Signed)
DATE OF PROCEDURE:  10/11/2020                              OPERATIVE REPORT  SURGEON:  Leta Baptist, MD  PREOPERATIVE DIAGNOSES: 1. Adenotonsillar hypertrophy. 2. Chronic tonsillitis and pharyngitis  POSTOPERATIVE DIAGNOSES: 1. Adenotonsillar hypertrophy. 2. Chronic tonsillitis and pharyngitis  PROCEDURE PERFORMED:  Adenotonsillectomy.  ANESTHESIA:  General endotracheal tube anesthesia.  COMPLICATIONS:  None.  ESTIMATED BLOOD LOSS:  Minimal.  INDICATION FOR PROCEDURE:  Adriana Moon is a 40 y.o. female with a history of chronic tonsillitis/pharyngitis and halitosis.  According to the patient, she has been experiencing chronic throat discomfort with halitosis for several years. The patient continued to be symptomatic despite medical treatments. On examination, the patient was noted to have bilateral cryptic tonsils, with numerous tonsilloliths. Based on the above findings, the decision was made for the patient to undergo the adenotonsillectomy procedure. Likelihood of success in reducing symptoms was also discussed.  The risks, benefits, alternatives, and details of the procedure were discussed with the patient.  Questions were invited and answered.  Informed consent was obtained.  DESCRIPTION:  The patient was taken to the operating room and placed supine on the operating table.  General endotracheal tube anesthesia was administered by the anesthesiologist.  The patient was positioned and prepped and draped in a standard fashion for adenotonsillectomy.  A Crowe-Davis mouth gag was inserted into the oral cavity for exposure. 2+ cryptic tonsils were noted bilaterally.  No bifidity was noted.  Indirect mirror examination of the nasopharynx revealed mild adenoid hypertrophy. The adenoid was ablated with the Coblator device. Hemostasis was achieved with the Coblator device.  The right tonsil was then grasped with a straight Allis clamp and retracted medially.  It was resected free from  the underlying pharyngeal constrictor muscles with the Coblator device.  The same procedure was repeated on the left side without exception.  The surgical sites were copiously irrigated.  The mouth gag was removed.  The care of the patient was turned over to the anesthesiologist.  The patient was awakened from anesthesia without difficulty.  The patient was extubated and transferred to the recovery room in good condition.  OPERATIVE FINDINGS:  Adenotonsillar hypertrophy.  SPECIMEN: None.  FOLLOWUP CARE:  The patient will be discharged home once awake and alert.  She will be placed on amoxicillin 800 mg p.o. b.i.d. for 5 days, and hydromorphone for postop pain control.   The patient will follow up in my office in approximately 2 weeks.  Tamieka Rancourt W Chaley Castellanos 10/11/2020 9:00 AM

## 2020-10-11 NOTE — Anesthesia Postprocedure Evaluation (Signed)
Anesthesia Post Note  Patient: Adriana Moon  Procedure(s) Performed: TONSILLECTOMY AND ADENOIDECTOMY     Patient location during evaluation: PACU Anesthesia Type: General Level of consciousness: awake and alert, oriented and patient cooperative Pain management: pain level controlled Vital Signs Assessment: post-procedure vital signs reviewed and stable Respiratory status: spontaneous breathing, nonlabored ventilation and respiratory function stable Cardiovascular status: blood pressure returned to baseline and stable Postop Assessment: no apparent nausea or vomiting Anesthetic complications: no   No notable events documented.  Last Vitals:  Vitals:   10/11/20 0944 10/11/20 0959  BP: (!) 134/92 118/77  Pulse: 81 78  Resp: 18 15  Temp:  (!) 36.2 C  SpO2: 94% 94%    Last Pain:  Vitals:   10/11/20 0959  TempSrc:   PainSc: Experiment

## 2020-10-11 NOTE — Anesthesia Procedure Notes (Signed)
Procedure Name: Intubation Date/Time: 10/11/2020 8:34 AM Performed by: Jonna Munro, CRNA Pre-anesthesia Checklist: Patient identified, Emergency Drugs available, Suction available, Patient being monitored and Timeout performed Patient Re-evaluated:Patient Re-evaluated prior to induction Oxygen Delivery Method: Circle system utilized Preoxygenation: Pre-oxygenation with 100% oxygen Induction Type: IV induction, Rapid sequence and Cricoid Pressure applied Laryngoscope Size: Mac and 3 Grade View: Grade I Tube type: Oral Tube size: 7.0 mm Number of attempts: 1 Airway Equipment and Method: Stylet Secured at: 21 cm Tube secured with: Tape Dental Injury: Teeth and Oropharynx as per pre-operative assessment

## 2020-10-12 ENCOUNTER — Encounter (HOSPITAL_COMMUNITY): Payer: Self-pay | Admitting: Otolaryngology

## 2020-10-16 NOTE — Patient Instructions (Signed)
After care instructions reviewed with patient. She will use vaseline/xeroform & gauze dressing until sites are healed. She understands that the colors/shades may fade which would then require further tattoo sessions for touch-ups. She will call for any concerns or questions. F/U in 6 wks

## 2020-10-16 NOTE — Progress Notes (Signed)
NIPPLE AREOLAR TATTOO PROCEDURE  PREOPERATIVE DIAGNOSIS:  Acquired absence of BILATERAL nipple areolar   POSTOPERATIVE DIAGNOSIS: Acquired absence of BILATERAL nipple areolar    PROCEDURES: BILATERAL nipple areolar tattoo   ATTENDING SURGEON: Dr. Lyndee Leo Dillingham  ANESTHESIA:  EMLA  COMPLICATIONS: None.  JUSTIFICATION FOR PROCEDURE:  Adriana Moon is a 40 y.o. female with a history of breast cancer status post  bilateral breast reconstruction. The patient presents for bilateral nipple areolar complex tattoo. Risks, benefits, indications, and alternatives of the above described procedures were discussed with the patient and all the patient's questions were answered.   DESCRIPTION OF PROCEDURE: After informed consent was obtained and proper identification of patient and surgical site was made, the patient was taken to the procedure room and pre-procedure photos taken & entered into chart. Placement/size & colors were chosen by patient. Patient was then placed supine on the operating room table. A time out was performed to confirm patient's identity and surgical site. The patient was prepped and draped in the usual sterile fashion.  Using a #7 tattoo head on the [employer] Digital Pop tattoo machine, pigment was instilled to the designed bilateral nipple areolar complexes. Once adequate pigment had been applied to the nipple areolar complex, a post-procedure photo taken. Vaseline,xeroform & gauze dressing was applied. The patient tolerated the procedure well.  After care instructions reviewed. [employer]amous Tattoo Ink used: Dark Tan/ [place]/ Antique Gold/ Tan [employer] [place]/ Dark Mink Ultra Duration used for topical anethesi Lot #'s & exp  dates on file

## 2020-10-25 ENCOUNTER — Other Ambulatory Visit: Payer: Self-pay | Admitting: Adult Health

## 2020-10-26 ENCOUNTER — Other Ambulatory Visit: Payer: Self-pay | Admitting: Podiatry

## 2020-11-15 ENCOUNTER — Encounter (INDEPENDENT_AMBULATORY_CARE_PROVIDER_SITE_OTHER): Payer: Self-pay | Admitting: Family Medicine

## 2020-11-15 ENCOUNTER — Ambulatory Visit (INDEPENDENT_AMBULATORY_CARE_PROVIDER_SITE_OTHER): Payer: 59 | Admitting: Family Medicine

## 2020-11-15 ENCOUNTER — Other Ambulatory Visit: Payer: Self-pay

## 2020-11-15 VITALS — BP 112/80 | HR 73 | Temp 98.0°F | Ht 60.0 in | Wt 271.0 lb

## 2020-11-15 DIAGNOSIS — F419 Anxiety disorder, unspecified: Secondary | ICD-10-CM

## 2020-11-15 DIAGNOSIS — Z9189 Other specified personal risk factors, not elsewhere classified: Secondary | ICD-10-CM

## 2020-11-15 DIAGNOSIS — E8881 Metabolic syndrome: Secondary | ICD-10-CM | POA: Diagnosis not present

## 2020-11-15 DIAGNOSIS — E559 Vitamin D deficiency, unspecified: Secondary | ICD-10-CM

## 2020-11-15 DIAGNOSIS — Z6841 Body Mass Index (BMI) 40.0 and over, adult: Secondary | ICD-10-CM

## 2020-11-15 DIAGNOSIS — F32A Depression, unspecified: Secondary | ICD-10-CM

## 2020-11-15 IMAGING — MG DIGITAL DIAGNOSTIC BILATERAL MAMMOGRAM WITH TOMO AND CAD
6 of 12 series · 6 of 36 positions shown · non-contrast
Comparison: Previous exam(s).

CLINICAL DATA: 37-year-old female complaining of palpable
abnormalities in the right breast. Status post lumpectomy for right

EXAM:
DIGITAL DIAGNOSTIC BILATERAL MAMMOGRAM WITH CAD AND TOMO
ULTRASOUND RIGHT BREAST

[L MLO synth-2D]
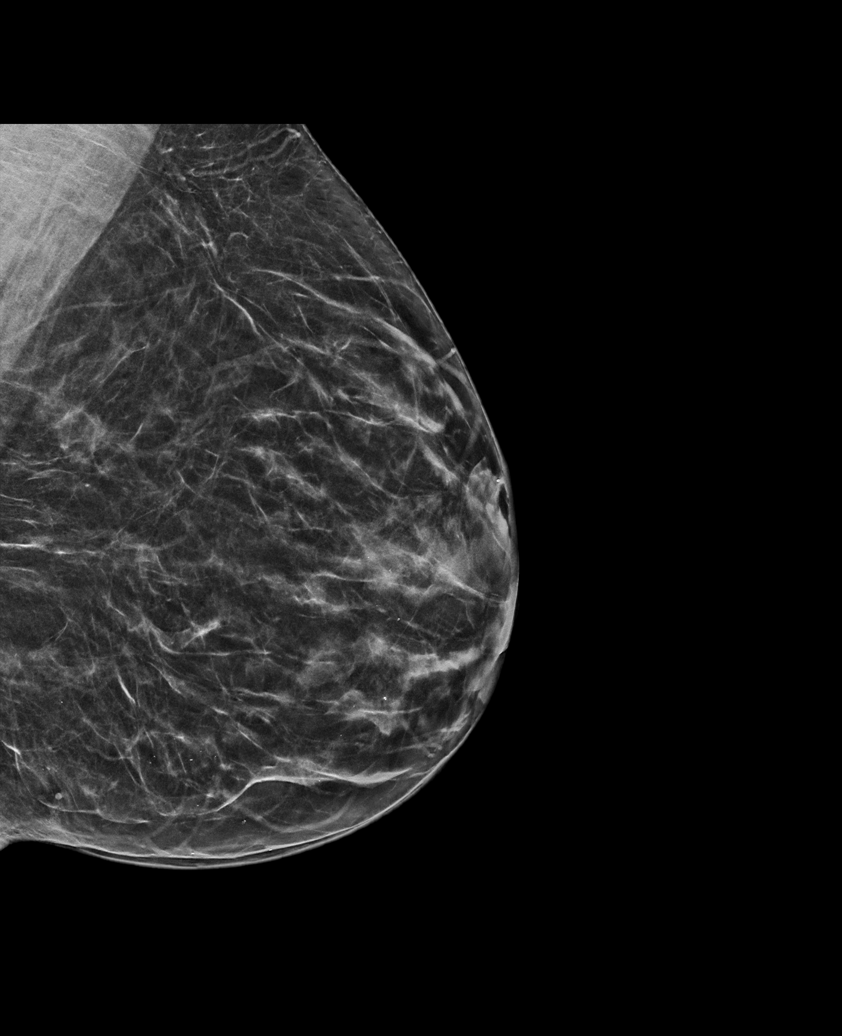

[R CC synth-2D]
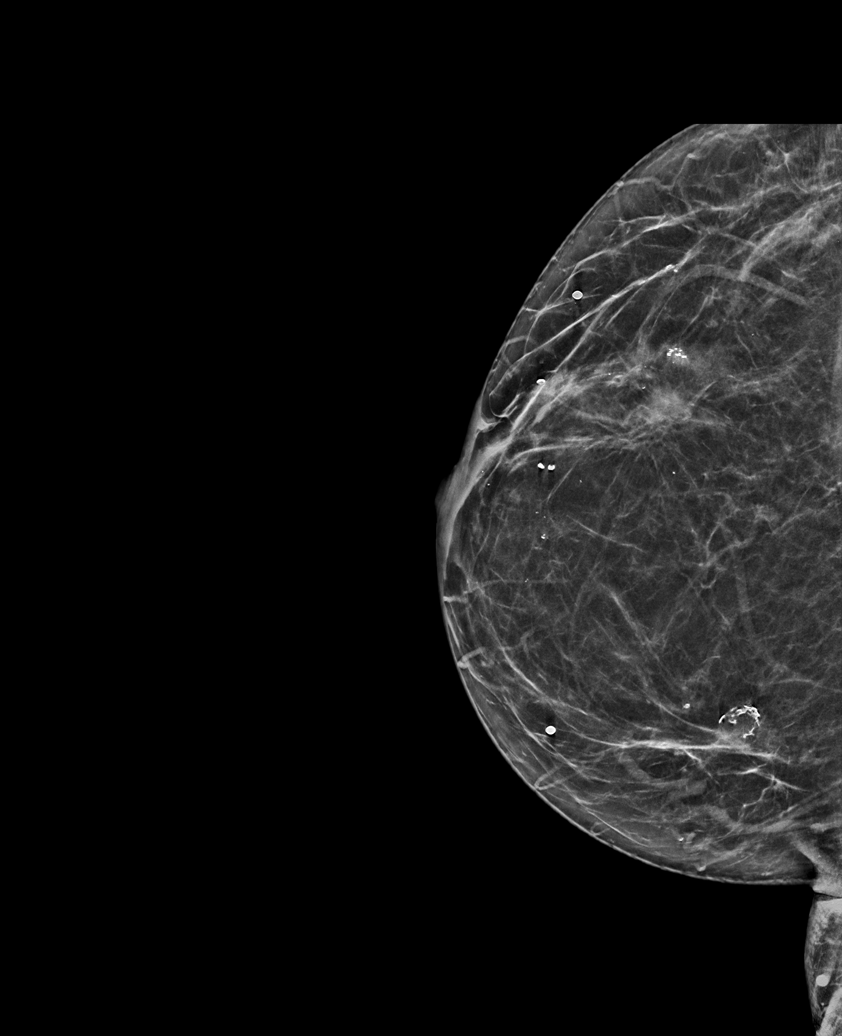

[R TAN synth-2D (1 of 2)]
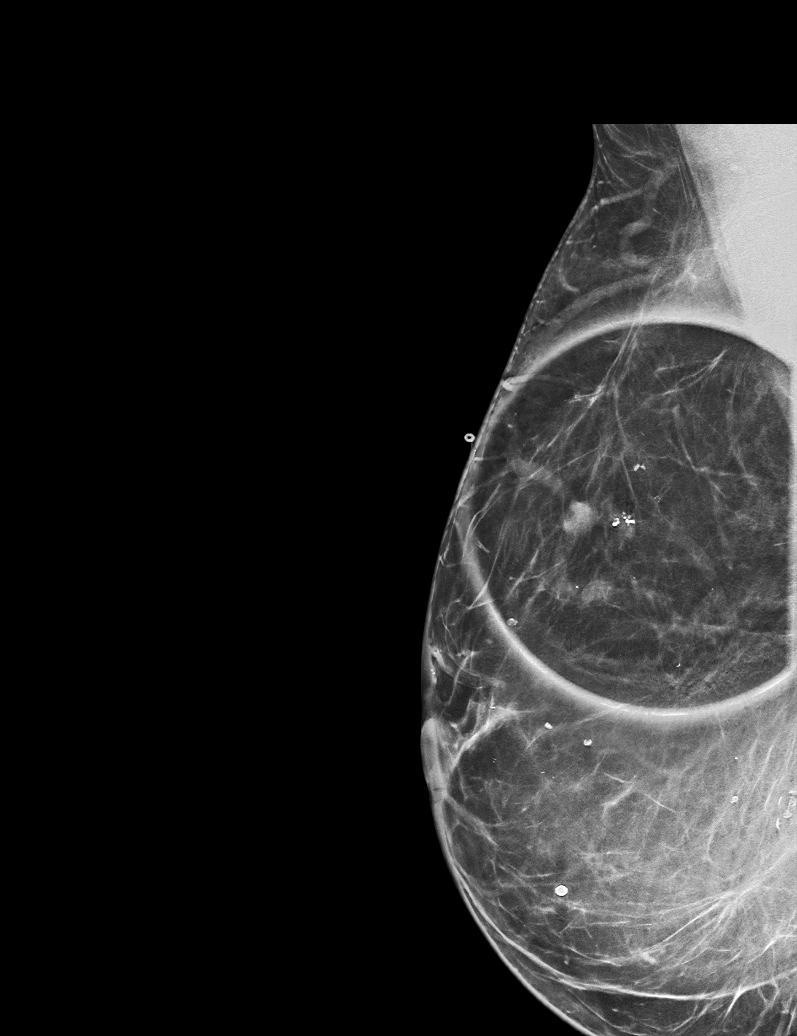

[L CC synth-2D]
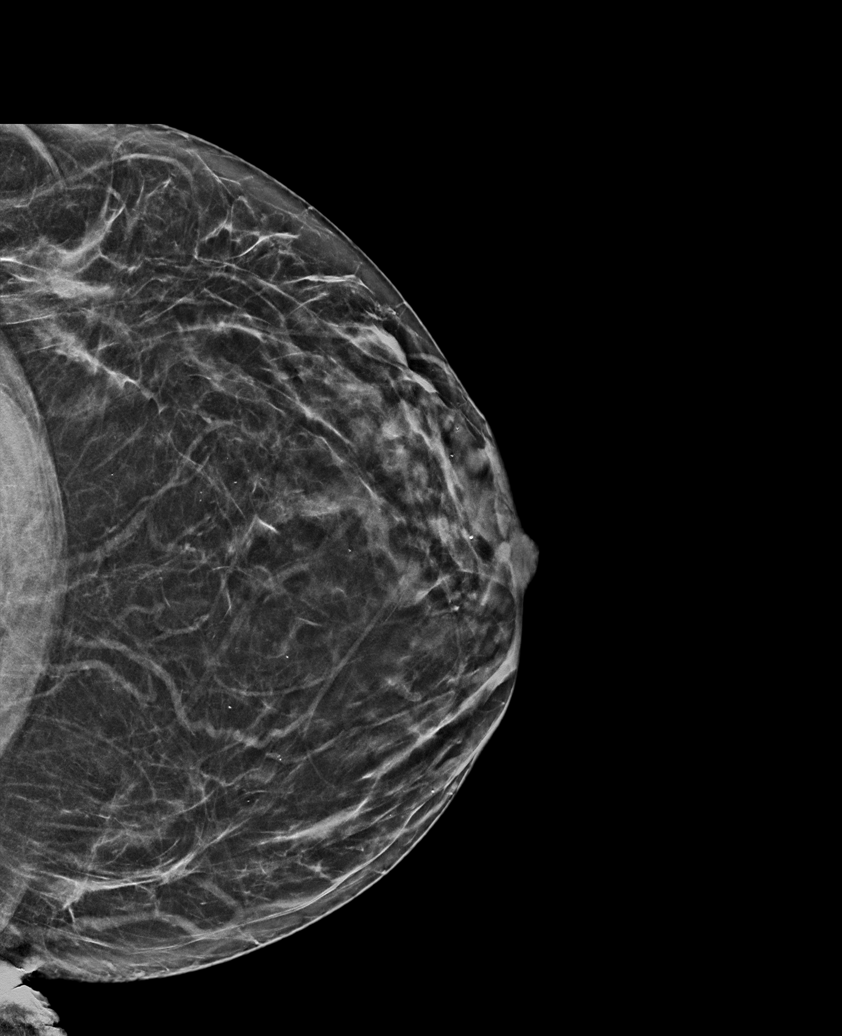

[R MLO synth-2D]
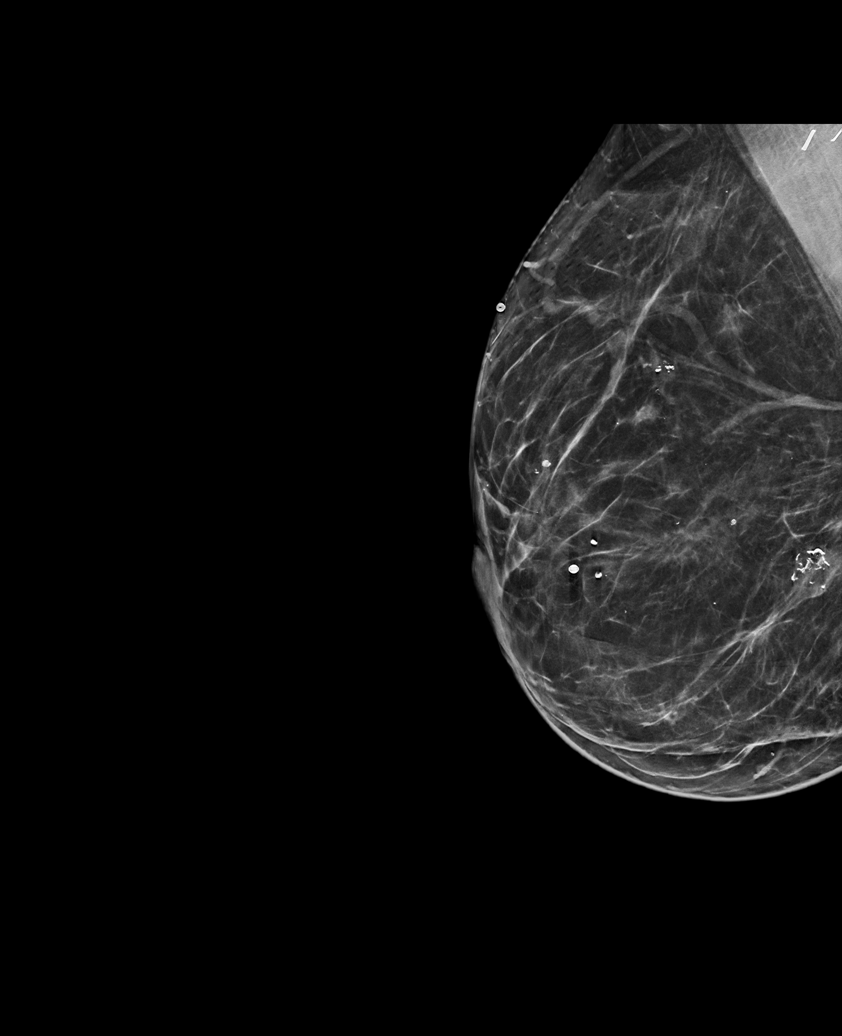

[R TAN synth-2D (2 of 2)]
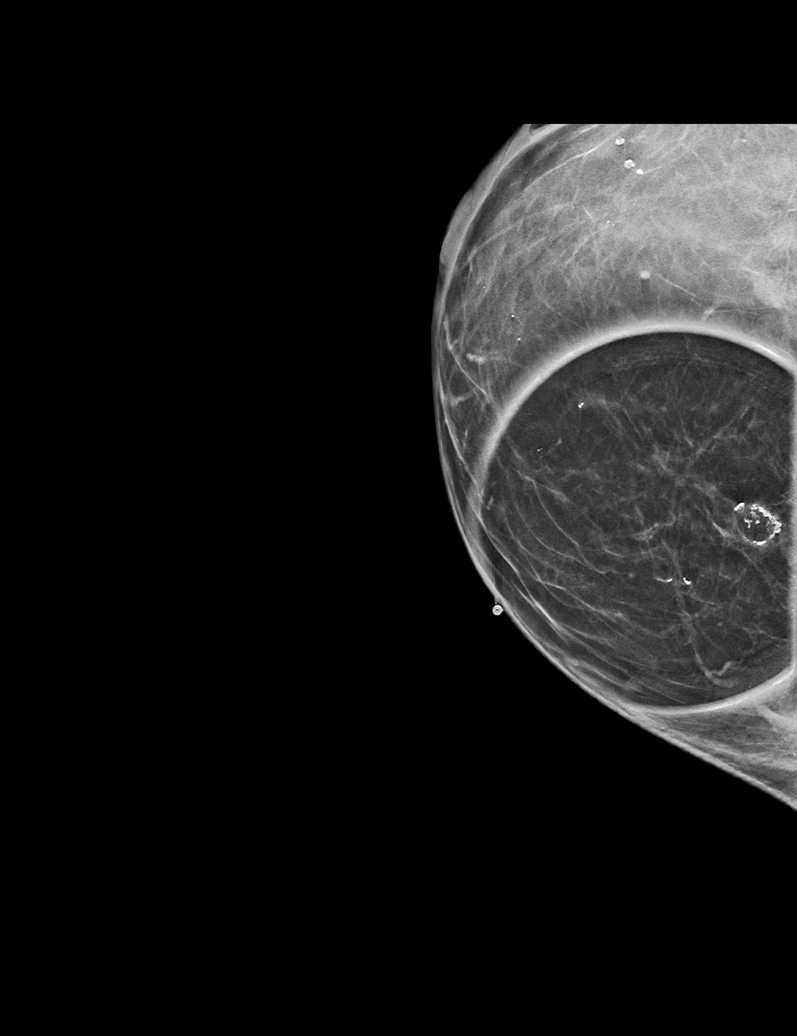

[6 of 36 positions shown; findings below may reference images not displayed]

ACR Breast Density Category b: There are scattered areas of
fibroglandular density.
FINDINGS: Changes of reduction mammoplasty is seen bilaterally. No suspicious
mass or malignant type microcalcifications identified in the left
breast.

There is nodularity and dystrophic appearing calcifications in the
upper-outer quadrant of the right breast where the patient complains
of a palpable abnormality. No malignant type microcalcifications
identified in the right breast.

Mammographic images were processed with CAD.

On physical exam, I palpate minimal thickening in the right breast
at 2 o'clock 7 cm from the nipple. I palpate a discrete nodule in
the right breast at 10 o'clock 7 cm from the nipple.

Targeted ultrasound is performed, showing a hypoechoic slightly
irregular mass in the right breast at 2 o'clock 7 cm from the nipple
measuring 9 x 5 x 10 mm. There is a lobulated hypoechoic mass in the
right breast at 10 o'clock 7 cm from the nipple measuring 8 x 5 x 8
mm. There are 2 hypoechoic masses in the right breast at [DATE] 7 cm
from the nipple measuring 6 x 3 x 7 mm and 5 x 2 x 4 mm. Sonographic
evaluation the right axilla does not show any enlarged adenopathy.
IMPRESSION: Four indeterminate masses in the right breast in a patient with
history of breast cancer at the age of 27.

RECOMMENDATION:
I recommend ultrasound-guided core biopsies of the masses in the
right breast at 2 o'clock 7 cm from the nipple and at 10 o'clock 7
cm from the nipple. If these masses are benign I would recommend
short-term interval follow-up of the additional 2 masses in the
right breast at [DATE] 7 cm from the nipple as well as evaluation with
MRI given the patient's history of breast cancer at the age of 27.

I have discussed the findings and recommendations with the patient.
Results were also provided in writing at the conclusion of the
visit. If applicable, a reminder letter will be sent to the patient
regarding the next appointment.

BI-RADS CATEGORY  4: Suspicious.

## 2020-11-15 MED ORDER — VITAMIN D (ERGOCALCIFEROL) 1.25 MG (50000 UNIT) PO CAPS: 50000.0000 [IU] | ORAL_CAPSULE | ORAL | 0 refills | Status: DC

## 2020-11-15 MED ORDER — BD PEN NEEDLE NANO 2ND GEN 32G X 4 MM MISC: 0 refills | Status: DC

## 2020-11-15 MED ORDER — VICTOZA 18 MG/3ML ~~LOC~~ SOPN: 1.2000 mg | PEN_INJECTOR | Freq: Every day | SUBCUTANEOUS | 0 refills | Status: DC

## 2020-11-15 MED ORDER — ESCITALOPRAM OXALATE 20 MG PO TABS: 20.0000 mg | ORAL_TABLET | Freq: Every day | ORAL | 0 refills | Status: DC

## 2020-11-20 NOTE — Progress Notes (Signed)
Chief Complaint:   OBESITY Adriana Moon is here to discuss her progress with her obesity treatment plan along with follow-up of her obesity related diagnoses. Adriana Moon is on following a lower carbohydrate, vegetable and lean protein rich diet plan (home) and states she is following her eating plan approximately 0% of the time. Adriana Moon states she is doing 0 minutes 0 times per week.  Today's visit was #: 58 Starting weight: 260 lbs Starting date: 04/28/2019 Today's weight: 271 lbs Today's date: 11/15/2020 Total lbs lost to date: 0 Total lbs lost since last in-office visit: 0  Interim History: Adriana Moon has not been here since 06/14/2020. She has been out of work due to surgery for plantar fasciitis. She is interested in bariatric surgery and working on completing the packet. She is up 39 lbs since Feb 2022.  Subjective:   1. Anxiety and depression Moriya's mood is stable on Lexapro 20 mg every day. She is struggling with emotional eating and using food for comfort to the extent that it is negatively impacting her health.   2. Insulin resistance Adriana Moon has been off her Victoza for 2 months.  Lab Results  Component Value Date   INSULIN 19.4 04/28/2019   Lab Results  Component Value Date   HGBA1C 5.4 04/28/2019    3. Vitamin D deficiency Adriana Moon's Vitamin D level was low at 22.2 on 04/27/2020. She has been out of her Vitamin D. supplement.  Lab Results  Component Value Date   VD25OH 22.2 (L) 04/28/2019   VD25OH 37 03/10/2010   VD25OH 43 11/30/2009    4. At risk for side effect of medication Adriana Moon is at risk for side effect of medication due to restart Victoza.  Assessment/Plan:   1. Anxiety and depression  We will refill Lexapro 20 mg for 1 month with no refills.Orders and follow up as documented in patient record.    - escitalopram (LEXAPRO) 20 MG tablet; Take 1 tablet (20 mg total) by mouth daily.  Dispense: 90 tablet; Refill: 0  2. Insulin resistance We will  refill Victoza 1.2 mg every day with no refills. Roger agreed to start at 0.6 mg for 2 weeks and then up dose if no nausea. We will refill needles also.Adriana Moon agreed to follow-up with [name] as directed to closely monitor her progress.  - liraglutide (VICTOZA) 18 MG/3ML SOPN; Inject 1.2 mg into the skin daily.  Dispense: 6 mL; Refill: 0 - Insulin Pen Needle (BD PEN NEEDLE NANO 2ND GEN) 32G X 4 MM MISC; Use 1 needle daily to inject Victoza.  Dispense: 100 each; Refill: 0  3. Vitamin D deficiency We will refill Vitamin D weekly for 1 month with no refills.Adriana Moon agrees to continue to take prescription Vitamin D 50,000 IU every week and will follow-up for routine testing of Vitamin D, at least 2-3 times per year to avoid over-replacement. We will check labs on next visit.  - Vitamin D, Ergocalciferol, (DRISDOL) 1.25 MG (50000 UNIT) CAPS capsule; Take 1 capsule (50,000 Units total) by mouth every 7 (seven) days.  Dispense: 4 capsule; Refill: 0  4. At risk for side effect of medication Adriana Moon was given approximately 15 minutes of drug side effect counseling today.  We discussed side effect possibility and risk versus benefits. Adriana Moon agreed to the medication and will contact this office if these side effects are intolerable.  Repetitive spaced learning was employed today to elicit superior memory formation and behavioral change.   5. Obesity: Current BMI 52.93 Adriana Moon is  currently in the action stage of change. As such, her goal is to continue with weight loss efforts. She has agreed to keeping a food journal and adhering to recommended goals of 1000-1100 calories and 75-80 grams of protein daily.   Handouts- Category 2, breakfast and lunch options. 100 calorie snacks.  Exercise goals:  None.  Behavioral modification strategies: increasing lean protein intake, decreasing simple carbohydrates, decreasing liquid calories, and keeping a strict food journal.  Adriana Moon has agreed to follow-up  with our clinic in 2 weeks (fasting).  Objective:   Blood pressure 112/80, pulse 73, temperature 98 F (36.7 C), height 5' (1.524 m), weight 271 lb (122.9 kg), SpO2 100 %. Body mass index is 52.93 kg/m.  General: Cooperative, alert, well developed, in no acute distress. HEENT: Conjunctivae and lids unremarkable. Cardiovascular: Regular rhythm.  Lungs: Normal work of breathing. Neurologic: No focal deficits.   Lab Results  Component Value Date   CREATININE 1.10 (H) 10/11/2020   BUN 23 (H) 10/11/2020   NA 139 10/11/2020   K 4.1 10/11/2020   CL 102 10/11/2020   CO2 24 10/11/2020   Lab Results  Component Value Date   ALT 7 04/28/2019   AST 7 04/28/2019   ALKPHOS 38 (L) 04/28/2019   BILITOT 0.5 04/28/2019   Lab Results  Component Value Date   HGBA1C 5.4 04/28/2019   HGBA1C 5.5 04/20/2016   Lab Results  Component Value Date   INSULIN 19.4 04/28/2019   Lab Results  Component Value Date   TSH 2.630 04/28/2019   Lab Results  Component Value Date   CHOL 170 04/28/2019   HDL 80 04/28/2019   LDLCALC 80 04/28/2019   TRIG 47 04/28/2019   CHOLHDL 2.3 05/08/2017   Lab Results  Component Value Date   VD25OH 22.2 (L) 04/28/2019   VD25OH 37 03/10/2010   VD25OH 43 11/30/2009   Lab Results  Component Value Date   WBC 5.3 10/11/2020   HGB 12.8 10/11/2020   HCT 40.0 10/11/2020   MCV 88.5 10/11/2020   PLT 334 10/11/2020   Lab Results  Component Value Date   IRON 82 04/28/2019   TIBC 315 04/28/2019   FERRITIN 153 (H) 04/28/2019   Attestation Statements:   Reviewed by clinician on day of visit: allergies, medications, problem list, medical history, surgical history, family history, social history, and previous encounter notes.  I, Lizbeth Bark, RMA, am acting as Location manager for [employer], [place].   I have reviewed the above documentation for accuracy and completeness, and I agree with the above. -  Georgianne Fick, FNP

## 2020-11-21 ENCOUNTER — Encounter (INDEPENDENT_AMBULATORY_CARE_PROVIDER_SITE_OTHER): Payer: Self-pay | Admitting: Family Medicine

## 2020-11-21 NOTE — Telephone Encounter (Signed)
Called and left patient a message.  Letter that was needed is ready to be picked up.  Will also send my chart message.

## 2020-11-29 ENCOUNTER — Ambulatory Visit (INDEPENDENT_AMBULATORY_CARE_PROVIDER_SITE_OTHER): Payer: 59 | Admitting: Adult Health

## 2020-12-01 ENCOUNTER — Ambulatory Visit (INDEPENDENT_AMBULATORY_CARE_PROVIDER_SITE_OTHER): Payer: 59 | Admitting: Family Medicine

## 2020-12-05 ENCOUNTER — Other Ambulatory Visit: Payer: Self-pay | Admitting: Surgery

## 2020-12-05 ENCOUNTER — Other Ambulatory Visit (HOSPITAL_COMMUNITY): Payer: Self-pay | Admitting: Surgery

## 2020-12-08 ENCOUNTER — Ambulatory Visit: Payer: 59

## 2020-12-22 ENCOUNTER — Other Ambulatory Visit: Payer: Self-pay | Admitting: Podiatry

## 2020-12-27 ENCOUNTER — Encounter (INDEPENDENT_AMBULATORY_CARE_PROVIDER_SITE_OTHER): Payer: Self-pay | Admitting: Adult Health

## 2020-12-27 ENCOUNTER — Other Ambulatory Visit: Payer: Self-pay

## 2020-12-27 ENCOUNTER — Ambulatory Visit (INDEPENDENT_AMBULATORY_CARE_PROVIDER_SITE_OTHER): Payer: 59 | Admitting: Adult Health

## 2020-12-27 VITALS — BP 114/78 | HR 74 | Temp 98.3°F | Ht 60.0 in | Wt 275.0 lb

## 2020-12-27 DIAGNOSIS — R5383 Other fatigue: Secondary | ICD-10-CM

## 2020-12-27 DIAGNOSIS — Z9189 Other specified personal risk factors, not elsewhere classified: Secondary | ICD-10-CM

## 2020-12-27 DIAGNOSIS — E88819 Insulin resistance, unspecified: Secondary | ICD-10-CM

## 2020-12-27 DIAGNOSIS — Z6841 Body Mass Index (BMI) 40.0 and over, adult: Secondary | ICD-10-CM

## 2020-12-27 DIAGNOSIS — E8881 Metabolic syndrome: Secondary | ICD-10-CM | POA: Diagnosis not present

## 2020-12-27 DIAGNOSIS — E559 Vitamin D deficiency, unspecified: Secondary | ICD-10-CM | POA: Diagnosis not present

## 2020-12-27 MED ORDER — VITAMIN D (ERGOCALCIFEROL) 1.25 MG (50000 UNIT) PO CAPS
50000.0000 [IU] | ORAL_CAPSULE | ORAL | 0 refills | Status: DC
Start: 2020-12-27 — End: 2021-01-16

## 2020-12-27 MED ORDER — METFORMIN HCL 500 MG PO TABS: ORAL_TABLET | ORAL | 0 refills | Status: DC

## 2020-12-27 MED ORDER — VICTOZA 18 MG/3ML ~~LOC~~ SOPN: 1.2000 mg | PEN_INJECTOR | Freq: Every day | SUBCUTANEOUS | 0 refills | Status: DC

## 2020-12-27 NOTE — Progress Notes (Signed)
Chief Complaint:   OBESITY Adriana Moon is here to discuss her progress with her obesity treatment plan along with follow-up of her obesity related diagnoses. Adriana Moon is on keeping a food journal and adhering to recommended goals of 1000-1100 calories and 75-80 grams of protein and states she is following her eating plan approximately 10% of the time. Adriana Moon states she is not exercising regularly.  Today's visit was #: 20 Starting weight: 260 lbs Starting date: 04/28/2019 Today's weight: 275 lbs Today's date: 12/27/2020 Total lbs lost to date: 0 Total lbs lost since last in-office visit: 0  Interim History: Adriana Moon restarted Victoza at last office visit on 11/15/2020.   She has titrated up to 1.2 mg daily with occasional nausea if she eats too much or consumes a high carbohydrate meal. She reports a recent increase in fatigue levels - denies personal history of thyroid disorder.  Subjective:   1. Insulin resistance Victoza was restarted at 0.6 mg at last office visit on 11/15/2020.  She has titrated up to Victoza 1.2 mg.  She denies mass in neck, dysphagia, dyspepsia, or persistent hoarseness.   Lab Results  Component Value Date   INSULIN 19.4 04/28/2019   Lab Results  Component Value Date   HGBA1C 5.4 04/28/2019   2. Vitamin D deficiency On 04/28/2019, vitamin D level - 22.2.   She is currently taking prescription ergocalciferol 50,000 IU each week. She denies nausea, vomiting or muscle weakness.  Lab Results  Component Value Date   VD25OH 22.2 (L) 04/28/2019   VD25OH 37 03/10/2010   VD25OH 43 11/30/2009   3. Fatigue, unspecified type She reports a recent increase in fatigue levels - denies personal history of thyroid disorder.   04/28/19 TSH- 2.630  4. At risk for activity intolerance Adriana Moon is at risk for activity intolerance due to increased fatigues.  Assessment/Plan:   1. Insulin resistance Refill Victoza 1.2 mg daily. Refill metformin 500 mg 2 tablets daily -  equals 1000 mg daily. Check labs. Adriana Moon will continue to work on weight loss, exercise, and decreasing simple carbohydrates to help decrease the risk of diabetes. Adriana Moon agreed to follow-up with [name] as directed to closely monitor her progress.  - Increase and refill liraglutide (VICTOZA) 18 MG/3ML SOPN; Inject 1.2 mg into the skin daily.  Dispense: 6 mL; Refill: 0 - Comprehensive metabolic panel - Refill metFORMIN (GLUCOPHAGE) 500 MG tablet; Take two tablets by mouth once daily- total '1000mg'$ /day.  Dispense: 60 tablet; Refill: 0 - Hemoglobin A1c - Insulin, random  2. Vitamin D deficiency Refill ergocalciferol 50,000 IU weekly, as per below. Check labs today.  - Refill Vitamin D, Ergocalciferol, (DRISDOL) 1.25 MG (50000 UNIT) CAPS capsule; Take 1 capsule (50,000 Units total) by mouth every 7 (seven) days.  Dispense: 4 capsule; Refill: 0 - VITAMIN D 25 Hydroxy (Vit-D Deficiency, Fractures)  3. Fatigue, unspecified type Check labs today.  - TSH + free T4 - T3 - Vitamin B12  4. At risk for activity intolerance Adriana Moon was given approximately 15 minutes of exercise intolerance counseling today. She is 40 y.o. female and has risk factors exercise intolerance including obesity. We discussed intensive lifestyle modifications today with an emphasis on specific weight loss instructions and strategies. Adriana Moon will slowly increase activity as tolerated.  Repetitive spaced learning was employed today to elicit superior memory formation and behavioral change.  5. Obesity: Current BMI 53.8  Adriana Moon is currently in the action stage of change. As such, her goal is to continue with weight  loss efforts. She has agreed to the Category 2 Plan.   Exercise goals:  Bike at home.  Behavioral modification strategies: increasing lean protein intake, decreasing simple carbohydrates, meal planning and cooking strategies, keeping healthy foods in the home, and planning for success.  Adriana Moon has agreed  to follow-up with our clinic in 3 weeks. She was informed of the importance of frequent follow-up visits to maximize her success with intensive lifestyle modifications for her multiple health conditions.   Adriana Moon was informed we would discuss her lab results at her next visit unless there is a critical issue that needs to be addressed sooner. Adriana Moon agreed to keep her next visit at the agreed upon time to discuss these results.  Objective:   Blood pressure 114/78, pulse 74, temperature 98.3 F (36.8 C), height 5' (1.524 m), weight 275 lb (124.7 kg), SpO2 97 %. Body mass index is 53.71 kg/m.  General: Cooperative, alert, well developed, in no acute distress. HEENT: Conjunctivae and lids unremarkable. Cardiovascular: Regular rhythm.  Lungs: Normal work of breathing. Neurologic: No focal deficits.   Lab Results  Component Value Date   CREATININE 1.10 (H) 10/11/2020   BUN 23 (H) 10/11/2020   NA 139 10/11/2020   K 4.1 10/11/2020   CL 102 10/11/2020   CO2 24 10/11/2020   Lab Results  Component Value Date   ALT 7 04/28/2019   AST 7 04/28/2019   ALKPHOS 38 (L) 04/28/2019   BILITOT 0.5 04/28/2019   Lab Results  Component Value Date   HGBA1C 5.4 04/28/2019   HGBA1C 5.5 04/20/2016   Lab Results  Component Value Date   INSULIN 19.4 04/28/2019   Lab Results  Component Value Date   TSH 2.630 04/28/2019   Lab Results  Component Value Date   CHOL 170 04/28/2019   HDL 80 04/28/2019   LDLCALC 80 04/28/2019   TRIG 47 04/28/2019   CHOLHDL 2.3 05/08/2017   Lab Results  Component Value Date   VD25OH 22.2 (L) 04/28/2019   VD25OH 37 03/10/2010   VD25OH 43 11/30/2009   Lab Results  Component Value Date   WBC 5.3 10/11/2020   HGB 12.8 10/11/2020   HCT 40.0 10/11/2020   MCV 88.5 10/11/2020   PLT 334 10/11/2020   Lab Results  Component Value Date   IRON 82 04/28/2019   TIBC 315 04/28/2019   FERRITIN 153 (H) 04/28/2019   Attestation Statements:   Reviewed by  clinician on day of visit: allergies, medications, problem list, medical history, surgical history, family history, social history, and previous encounter notes.  I, Water quality scientist, CMA, am acting as Location manager for Mina Marble, NP.  I have reviewed the above documentation for accuracy and completeness, and I agree with the above. -  Holley Wirt d. Ebonie Westerlund, NP-C

## 2020-12-28 LAB — COMPREHENSIVE METABOLIC PANEL
ALT: 10 IU/L (ref 0–32)
AST: 16 IU/L (ref 0–40)
Albumin/Globulin Ratio: 1.4 (ref 1.2–2.2)
Albumin: 4.1 g/dL (ref 3.8–4.8)
Alkaline Phosphatase: 44 IU/L (ref 44–121)
BUN/Creatinine Ratio: 11 (ref 9–23)
BUN: 13 mg/dL (ref 6–20)
Bilirubin Total: 0.4 mg/dL (ref 0.0–1.2)
CO2: 24 mmol/L (ref 20–29)
Calcium: 9.5 mg/dL (ref 8.7–10.2)
Chloride: 105 mmol/L (ref 96–106)
Creatinine, Ser: 1.23 mg/dL — ABNORMAL HIGH (ref 0.57–1.00)
Globulin, Total: 3 g/dL (ref 1.5–4.5)
Glucose: 79 mg/dL (ref 65–99)
Potassium: 5 mmol/L (ref 3.5–5.2)
Sodium: 141 mmol/L (ref 134–144)
Total Protein: 7.1 g/dL (ref 6.0–8.5)
eGFR: 58 mL/min/{1.73_m2} — ABNORMAL LOW (ref >59)

## 2020-12-28 LAB — VITAMIN B12: Vitamin B-12: 704 pg/mL (ref 232–1245)

## 2020-12-28 LAB — TSH+FREE T4
Free T4: 1.22 ng/dL (ref 0.82–1.77)
TSH: 2.21 u[IU]/mL (ref 0.450–4.500)

## 2020-12-28 LAB — VITAMIN D 25 HYDROXY (VIT D DEFICIENCY, FRACTURES): Vit D, 25-Hydroxy: 45.8 ng/mL (ref 30.0–100.0)

## 2020-12-28 LAB — INSULIN, RANDOM: INSULIN: 9.8 u[IU]/mL (ref 2.6–24.9)

## 2020-12-28 LAB — HEMOGLOBIN A1C
Est. average glucose Bld gHb Est-mCnc: 111 mg/dL
Hgb A1c MFr Bld: 5.5 % (ref 4.8–5.6)

## 2020-12-28 LAB — T3: T3, Total: 153 ng/dL (ref 71–180)

## 2021-01-16 ENCOUNTER — Other Ambulatory Visit: Payer: Self-pay

## 2021-01-16 ENCOUNTER — Telehealth (INDEPENDENT_AMBULATORY_CARE_PROVIDER_SITE_OTHER): Payer: 59 | Admitting: Family Medicine

## 2021-01-16 ENCOUNTER — Encounter (INDEPENDENT_AMBULATORY_CARE_PROVIDER_SITE_OTHER): Payer: Self-pay | Admitting: Family Medicine

## 2021-01-16 DIAGNOSIS — R5383 Other fatigue: Secondary | ICD-10-CM

## 2021-01-16 DIAGNOSIS — E8881 Metabolic syndrome: Secondary | ICD-10-CM | POA: Diagnosis not present

## 2021-01-16 DIAGNOSIS — E559 Vitamin D deficiency, unspecified: Secondary | ICD-10-CM

## 2021-01-16 DIAGNOSIS — R944 Abnormal results of kidney function studies: Secondary | ICD-10-CM | POA: Diagnosis not present

## 2021-01-16 DIAGNOSIS — F32A Depression, unspecified: Secondary | ICD-10-CM

## 2021-01-16 DIAGNOSIS — Z6841 Body Mass Index (BMI) 40.0 and over, adult: Secondary | ICD-10-CM

## 2021-01-16 DIAGNOSIS — F419 Anxiety disorder, unspecified: Secondary | ICD-10-CM

## 2021-01-16 MED ORDER — TIRZEPATIDE 7.5 MG/0.5ML ~~LOC~~ SOAJ: 7.5000 mg | SUBCUTANEOUS | 0 refills | Status: DC

## 2021-01-16 MED ORDER — VITAMIN D (ERGOCALCIFEROL) 1.25 MG (50000 UNIT) PO CAPS
50000.0000 [IU] | ORAL_CAPSULE | ORAL | 0 refills | Status: DC
Start: 2021-01-16 — End: 2021-03-08

## 2021-01-16 MED ORDER — ESCITALOPRAM OXALATE 20 MG PO TABS: 20.0000 mg | ORAL_TABLET | Freq: Every day | ORAL | 0 refills | Status: DC

## 2021-01-17 ENCOUNTER — Encounter (INDEPENDENT_AMBULATORY_CARE_PROVIDER_SITE_OTHER): Payer: Self-pay

## 2021-01-17 NOTE — Progress Notes (Signed)
TeleHealth Visit:  Due to the COVID-19 pandemic, this visit was completed with telemedicine (audio/video) technology to reduce patient and provider exposure as well as to preserve personal protective equipment.   Adriana Moon has verbally consented to this TeleHealth visit. The patient is located at home, the provider is located at the [facility] office. The participants in this visit include the listed provider and patient. The visit was conducted today via video.  Chief Complaint: OBESITY Adriana Moon is here to discuss her progress with her obesity treatment plan along with follow-up of her obesity related diagnoses. Adriana Moon is on the Category 2 Plan and states she is following her eating plan approximately 60% of the time. Adriana Moon states she is walking for 5 minutes 5 times per week.  Today's visit was #: 21 Starting weight: 260 lbs Starting date: 04/28/2019  Interim History: Adriana Moon does not feel well today so we are doing a virtual visit. She feels she has lost weight but hasn't reported a weight today. She has met with a Psychologist, sport and exercise at [facility] and is planning RNYGB.   Subjective:   1. Vitamin D deficiency Adriana Moon's Vitamin D is nearly at goal of 50 (45.8). She is on weekly prescription Vitamin D.  Lab Results  Component Value Date   VD25OH 45.8 12/27/2020   VD25OH 22.2 (L) 04/28/2019   VD25OH 37 03/10/2010    2. Fatigue, unspecified type Adriana Moon notes severe fatigue. Her TSH and Hgb were within normal limits. She notes snoring. Her Bariatric surgeon did not recommend sleep study. She is on Lexapro and takes in pm.  3. Decreased glomerular filtration rate Adriana Moon's GFR is stable at 58. She was 66 in Oct. 2021. She has no history of diabetes mellitus or hypertension. She does not take NSAIDs.  4. Insulin resistance Adriana Moon is on Victoza. She notes polyphagia and cravings. She has no history of pancreatitis, cholecystitis, or thyroid cancer..   Lab  Results  Component Value Date   INSULIN 9.8 12/27/2020   INSULIN 19.4 04/28/2019   Lab Results  Component Value Date   HGBA1C 5.5 12/27/2020    5. Anxiety and depression Adriana Moon's mood is stable. She notes some fatigue. She takes Lexapro at night.   Assessment/Plan:   1. Vitamin D deficiency We will refill prescription Vitamin D 50,000 IU every week and [name] will follow-up for routine testing of Vitamin D, at least 2-3 times per year to avoid over-replacement.  - Vitamin D, Ergocalciferol, (DRISDOL) 1.25 MG (50000 UNIT) CAPS capsule; Take 1 capsule (50,000 Units total) by mouth every 7 (seven) days.  Dispense: 4 capsule; Refill: 0  2. Fatigue, unspecified type Adriana Moon does feel that her weight is causing her energy to be lower than it should be. We will continue to monitor. Adriana Moon is possibly secondary to poor sleep quality. Fatigue may be related to obesity, depression or many other causes. [name] will focus on self care including making healthy food choices, increasing physical activity and focusing on stress reduction.   3. Decreased glomerular filtration rate We will continue to monitor. Adriana Moon will continue to avoid NSAIDs and she will increase water.  4. Insulin resistance Adriana Moon discontinue Victoza. Adriana Moon agrees to start Mounjaro 7.5 mg weekly.  - tirzepatide (MOUNJARO) 7.5 MG/0.5ML Pen; Inject 7.5 mg into the skin once a week.  Dispense: 6 mL; Refill: 0  5. Anxiety and depression  We will refill Lexapro 20 mg daily for 3 months with no refills. Orders and follow up as documented in  patient record.    - escitalopram (LEXAPRO) 20 MG tablet; Take 1 tablet (20 mg total) by mouth daily.  Dispense: 90 tablet; Refill: 0  6. Obesity: Current BMI 53.71 Adriana Moon is currently in the action stage of change. As such, her goal is to continue with weight loss efforts. She has agreed to keeping a food journal and adhering to recommended goals of 1000-1100 calories and  75-80 grams of protein daily.   Exercise goals: All adults should avoid inactivity. Some physical activity is better than none, and adults who participate in any amount of physical activity gain some health benefits.  Behavioral modification strategies: increasing lean protein intake and decreasing simple carbohydrates.  Adriana Moon has agreed to follow-up with our clinic in 3 weeks.  Objective:   VITALS: Per patient if applicable, see vitals. GENERAL: Alert and in no acute distress. CARDIOPULMONARY: No increased WOB. Speaking in clear sentences.  PSYCH: Pleasant and cooperative. Speech normal rate and rhythm. Affect is appropriate. Insight and judgement are appropriate. Attention is focused, linear, and appropriate.  NEURO: Oriented as arrived to appointment on time with no prompting.   Lab Results  Component Value Date   CREATININE 1.23 (H) 12/27/2020   BUN 13 12/27/2020   NA 141 12/27/2020   K 5.0 12/27/2020   CL 105 12/27/2020   CO2 24 12/27/2020   Lab Results  Component Value Date   ALT 10 12/27/2020   AST 16 12/27/2020   ALKPHOS 44 12/27/2020   BILITOT 0.4 12/27/2020   Lab Results  Component Value Date   HGBA1C 5.5 12/27/2020   HGBA1C 5.4 04/28/2019   HGBA1C 5.5 04/20/2016   Lab Results  Component Value Date   INSULIN 9.8 12/27/2020   INSULIN 19.4 04/28/2019   Lab Results  Component Value Date   TSH 2.210 12/27/2020   Lab Results  Component Value Date   CHOL 170 04/28/2019   HDL 80 04/28/2019   LDLCALC 80 04/28/2019   TRIG 47 04/28/2019   CHOLHDL 2.3 05/08/2017   Lab Results  Component Value Date   VD25OH 45.8 12/27/2020   VD25OH 22.2 (L) 04/28/2019   VD25OH 37 03/10/2010   Lab Results  Component Value Date   WBC 5.3 10/11/2020   HGB 12.8 10/11/2020   HCT 40.0 10/11/2020   MCV 88.5 10/11/2020   PLT 334 10/11/2020   Lab Results  Component Value Date   IRON 82 04/28/2019   TIBC 315 04/28/2019   FERRITIN 153 (H) 04/28/2019    Attestation  Statements:   Reviewed by clinician on day of visit: allergies, medications, problem list, medical history, surgical history, family history, social history, and previous encounter notes.  I, Lizbeth Bark, RMA, am acting as Location manager for [employer], [place].   I have reviewed the above documentation for accuracy and completeness, and I agree with the above. - Georgianne Fick, FNP

## 2021-01-19 ENCOUNTER — Encounter (INDEPENDENT_AMBULATORY_CARE_PROVIDER_SITE_OTHER): Payer: Self-pay | Admitting: Family Medicine

## 2021-01-23 NOTE — Telephone Encounter (Signed)
Last OV with Dawn 

## 2021-02-02 ENCOUNTER — Encounter: Payer: 59 | Attending: Surgery | Admitting: Skilled Nursing Facility1

## 2021-02-02 ENCOUNTER — Encounter: Payer: Self-pay | Admitting: Skilled Nursing Facility1

## 2021-02-02 ENCOUNTER — Other Ambulatory Visit: Payer: Self-pay

## 2021-02-02 DIAGNOSIS — N189 Chronic kidney disease, unspecified: Secondary | ICD-10-CM | POA: Insufficient documentation

## 2021-02-02 DIAGNOSIS — Z6841 Body Mass Index (BMI) 40.0 and over, adult: Secondary | ICD-10-CM | POA: Diagnosis present

## 2021-02-02 NOTE — Progress Notes (Signed)
Nutrition Assessment for Bariatric Surgery Medical Nutrition Therapy Appt Start Time: 1:39    End Time: 2:44  Patient was seen on 02/02/2021 for Pre-Operative Nutrition Assessment. Letter of approval faxed to [facility] bariatric surgery program coordinator on 02/02/2021.   Referral stated Supervised Weight Loss (SWL) visits needed: 3  Planned surgery: RYGB Pt expectation of surgery: to lose weight Pt expectation of dietitian: none identified     NUTRITION ASSESSMENT   Anthropometrics  Start weight at [facility]: 277.8 lbs (date: 02/02/2021)  Height: 60 in BMI: 54.25 kg/m2     Clinical  Medical hx: GERD, PCOS, IBS. Chronic kidney disease  Medications: see list  Labs: 1.23, GFR 58, vtimain D 45.8 Notable signs/symptoms: increased bowel habits with anxiety  Any previous deficiencies? 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  Pt states she does have anxiousness with a physical manifestation about 2 times a week.  Pt states she has plantar fascia issues reducing her overall movement due to pain.  Pt states she goes for 10 minutes walks a day. Pt states she bought an exercises machine but has not really used it.  Pt states she has been feeling fatigued for the last month.  Pt states she does eat past fullness stating she does not like to throw food away.  Pt states she did use laxatives to lose weight about 5 years ago stating it did not work so she stopped.  Pt states her work schedule is about to change to a 12 hour rotation.   24-Hr Dietary Recall First Meal: poptart Snack:  Second Meal: 2 chicken patties on a bun with mayo + pringle's Snack:  Third Meal: pinto beans + 3 corns + 2 corn bread Snack: corn Beverages:  carbonated water, diet soda, water, coffee + cream   Estimated Energy Needs Calories: 1500   NUTRITION DIAGNOSIS  Overweight/obesity ([place]-3.3) related to past poor dietary habits and physical inactivity as evidenced by patient w/ planned RYGB surgery following dietary guidelines for continued weight loss.    NUTRITION INTERVENTION  Nutrition counseling (C-1) and education (E-2) to facilitate bariatric surgery goals.  Educated pt on micronutrient deficiencies post surgery and strategies to mitigate that risk Soy and breast cancer Renal disease and protein     Pre-Op Goals Reviewed with the Patient Track food and beverage intake (pen and paper, MyFitness Pal, Baritastic app, etc.) Make healthy food choices while monitoring portion sizes Consume 3 meals per day or try to eat every 3-5 hours Avoid concentrated sugars and fried foods Keep sugar & fat in the single digits per serving on food labels Practice CHEWING your food (aim for applesauce consistency) Practice not drinking 15 minutes before, during, and 30 minutes after each meal and snack Avoid all carbonated beverages (ex: soda, sparkling beverages)  Limit caffeinated beverages (ex: coffee, tea, energy drinks) Avoid all sugar-sweetened beverages (ex: regular soda, sports drinks)  Avoid alcohol  Aim for 64-100 ounces of FLUID daily (with at least half of fluid intake being plain water)  Aim for at least 60-80 grams of PROTEIN daily Look for a liquid protein source that contains ?15 g protein and ?5 g carbohydrate (ex: shakes, drinks, shots) Make a list of non-food related activities Physical activity is an important part of a  healthy lifestyle so keep it moving! The goal is to reach 150 minutes of exercise per week, including cardiovascular and weight baring activity.  *Goals that are bolded indicate the pt would like to start working towards these  Handouts Provided Include  Bariatric Surgery handouts (Nutrition Visits, Pre-Op  Goals, Protein Shakes, Vitamins & Minerals)  Learning Style & Readiness for Change Teaching method utilized: Visual & Auditory  Demonstrated degree of understanding via: Teach Back  Readiness Level: Action Barriers to learning/adherence to lifestyle change: none identified   RD's Notes for Next Visit Assess pts adherence to chosen goals     MONITORING & EVALUATION Dietary intake, weekly physical activity, body weight, and pre-op goals reached at next nutrition visit.    Next Steps  Patient is to follow up at [facility] for Pre-Op Class >2 weeks before surgery for further nutrition education.

## 2021-02-06 ENCOUNTER — Ambulatory Visit (INDEPENDENT_AMBULATORY_CARE_PROVIDER_SITE_OTHER): Payer: 59 | Admitting: Family Medicine

## 2021-02-06 ENCOUNTER — Encounter (INDEPENDENT_AMBULATORY_CARE_PROVIDER_SITE_OTHER): Payer: Self-pay

## 2021-02-20 ENCOUNTER — Ambulatory Visit: Payer: Medicaid Other

## 2021-03-08 ENCOUNTER — Ambulatory Visit (INDEPENDENT_AMBULATORY_CARE_PROVIDER_SITE_OTHER): Payer: 59 | Admitting: Family Medicine

## 2021-03-08 ENCOUNTER — Encounter (INDEPENDENT_AMBULATORY_CARE_PROVIDER_SITE_OTHER): Payer: Self-pay | Admitting: Family Medicine

## 2021-03-08 ENCOUNTER — Ambulatory Visit: Payer: 59 | Admitting: Skilled Nursing Facility1

## 2021-03-08 ENCOUNTER — Other Ambulatory Visit: Payer: Self-pay

## 2021-03-08 VITALS — BP 131/88 | HR 79 | Temp 98.1°F | Ht 60.0 in | Wt 264.0 lb

## 2021-03-08 DIAGNOSIS — E559 Vitamin D deficiency, unspecified: Secondary | ICD-10-CM | POA: Diagnosis not present

## 2021-03-08 DIAGNOSIS — Z6841 Body Mass Index (BMI) 40.0 and over, adult: Secondary | ICD-10-CM

## 2021-03-08 DIAGNOSIS — K219 Gastro-esophageal reflux disease without esophagitis: Secondary | ICD-10-CM | POA: Diagnosis not present

## 2021-03-08 DIAGNOSIS — E8881 Metabolic syndrome: Secondary | ICD-10-CM

## 2021-03-08 MED ORDER — TIRZEPATIDE 10 MG/0.5ML ~~LOC~~ SOAJ: 10.0000 mg | SUBCUTANEOUS | 0 refills | Status: DC

## 2021-03-08 MED ORDER — OMEPRAZOLE 40 MG PO CPDR
40.0000 mg | DELAYED_RELEASE_CAPSULE | Freq: Every day | ORAL | 0 refills | Status: DC
Start: 2021-03-08 — End: 2021-06-06

## 2021-03-08 MED ORDER — VITAMIN D (ERGOCALCIFEROL) 1.25 MG (50000 UNIT) PO CAPS: 50000.0000 [IU] | ORAL_CAPSULE | ORAL | 0 refills | Status: DC

## 2021-03-09 ENCOUNTER — Encounter (INDEPENDENT_AMBULATORY_CARE_PROVIDER_SITE_OTHER): Payer: Self-pay | Admitting: Family Medicine

## 2021-03-09 NOTE — Progress Notes (Signed)
Chief Complaint:   OBESITY Adriana Moon is here to discuss her progress with her obesity treatment plan along with follow-up of her obesity related diagnoses. Adriana Moon is on the Category 1 Plan and keeping a food journal and adhering to recommended goals of 1000-1100 calories and 75-80 grams of protein and states she is following her eating plan approximately 40% of the time. Adriana Moon states she is walking for 10 minutes 5 times per week.  Today's visit was #: 22 Starting weight: 260 lbs Starting date: 04/28/2019 Today's weight: 264 lbs Today's date: 03/08/2021 Total lbs lost to date: 0 Total lbs lost since last in-office visit: 11 lbs  Interim History: Adriana Moon is preparing for bariatric surgery by Dr. Romana Juniper at [facility].  She has recently seen the [facility] for approval for surgery and has an upcoming appt to see the psychologist. She said that the RD voiced that she felt 80 gms of protein was too high since Adriana Moon has CKD stage 3a. I reassured her that CKD is mild and 80 mg is appropriate for her. She is not journaling but says she eats the same foods every day. She averages only 60 grams of protein per day and has only has one meal per day.  Subjective:   1. Gastroesophageal reflux disease without esophagitis Adriana Moon notes increased GERD since starting Mounjaro. She is no longer on Dexilant because she can't afford it.  2. Insulin resistance Adriana Moon is on Mounjaro 7.5 mg weekly. She notes good appetite suppression but increased GERD symptoms.  Lab Results  Component Value Date   INSULIN 9.8 12/27/2020   INSULIN 19.4 04/28/2019   Lab Results  Component Value Date   HGBA1C 5.5 12/27/2020    3. Vitamin D deficiency Adriana Moon's Vitamin D is low at 85 (goal is 50). She is on weekly prescription Vitamin D.  Lab Results  Component Value Date   VD25OH 45.8 12/27/2020   VD25OH 22.2 (L) 04/28/2019   VD25OH 37 03/10/2010    Assessment/Plan:   1.  Gastroesophageal reflux disease without esophagitis  Adriana Moon agrees to start Prilosec 40 mg daily. She may also take OTC Pepcid and or TUMS PRN.   - omeprazole (PRILOSEC) 40 MG capsule; Take 1 capsule (40 mg total) by mouth daily.  Dispense: 90 capsule; Refill: 0  2. Insulin resistance Adriana Moon agrees to increase dose of Mounjaro to 10 mg.   - tirzepatide (MOUNJARO) 10 MG/0.5ML Pen; Inject 10 mg into the skin once a week.  Dispense: 6 mL; Refill: 0  3. Vitamin D deficiency We will refill prescription Vitamin D 50,000 IU every week and Adriana Moon will follow-up for routine testing of Vitamin D, at least 2-3 times per year to avoid over-replacement.  - Vitamin D, Ergocalciferol, (DRISDOL) 1.25 MG (50000 UNIT) CAPS capsule; Take 1 capsule (50,000 Units total) by mouth every 7 (seven) days.  Dispense: 4 capsule; Refill: 0  4. Obesity: Current BMI 51.56 Adriana Moon is currently in the action stage of change. As such, her goal is to continue with weight loss efforts. She has agreed to keeping a food journal and adhering to recommended goals of 1000-1100 calories and 75-80 grams of protein daily.  We discussed ideas for increasing protein.  Adriana Moon will increase meals to 3 times per day and work on increasing protein to 80 mg daily.  We discussed the importance of protein intake and overall adherence to healthy diet to be successful long-term after RNYGB.  Exercise goals:  As is.  Behavioral modification  strategies: increasing lean protein intake and no skipping meals.  Adriana Moon has agreed to follow-up with our clinic in 3-4 weeks.   Objective:   Blood pressure 131/88, pulse 79, temperature 98.1 F (36.7 C), height 5' (1.524 m), weight 264 lb (119.7 kg), SpO2 98 %. Body mass index is 51.56 kg/m.  General: Cooperative, alert, well developed, in no acute distress. HEENT: Conjunctivae and lids unremarkable. Cardiovascular: Regular rhythm.  Lungs: Normal work of breathing. Neurologic: No  focal deficits.   Lab Results  Component Value Date   CREATININE 1.23 (H) 12/27/2020   BUN 13 12/27/2020   NA 141 12/27/2020   K 5.0 12/27/2020   CL 105 12/27/2020   CO2 24 12/27/2020   Lab Results  Component Value Date   ALT 10 12/27/2020   AST 16 12/27/2020   ALKPHOS 44 12/27/2020   BILITOT 0.4 12/27/2020   Lab Results  Component Value Date   HGBA1C 5.5 12/27/2020   HGBA1C 5.4 04/28/2019   HGBA1C 5.5 04/20/2016   Lab Results  Component Value Date   INSULIN 9.8 12/27/2020   INSULIN 19.4 04/28/2019   Lab Results  Component Value Date   TSH 2.210 12/27/2020   Lab Results  Component Value Date   CHOL 170 04/28/2019   HDL 80 04/28/2019   LDLCALC 80 04/28/2019   TRIG 47 04/28/2019   CHOLHDL 2.3 05/08/2017   Lab Results  Component Value Date   VD25OH 45.8 12/27/2020   VD25OH 22.2 (L) 04/28/2019   VD25OH 37 03/10/2010   Lab Results  Component Value Date   WBC 5.3 10/11/2020   HGB 12.8 10/11/2020   HCT 40.0 10/11/2020   MCV 88.5 10/11/2020   PLT 334 10/11/2020   Lab Results  Component Value Date   IRON 82 04/28/2019   TIBC 315 04/28/2019   FERRITIN 153 (H) 04/28/2019   Attestation Statements:   Reviewed by clinician on day of visit: allergies, medications, problem list, medical history, surgical history, family history, social history, and previous encounter notes.  I, Lizbeth Bark, RMA, am acting as Location manager for [employer], [place].   I have reviewed the above documentation for accuracy and completeness, and I agree with the above. -  Georgianne Fick, FNP

## 2021-04-02 ENCOUNTER — Other Ambulatory Visit: Payer: Self-pay | Admitting: Podiatry

## 2021-04-02 NOTE — Telephone Encounter (Signed)
Please advise 

## 2021-04-04 ENCOUNTER — Ambulatory Visit (INDEPENDENT_AMBULATORY_CARE_PROVIDER_SITE_OTHER): Payer: 59 | Admitting: Family Medicine

## 2021-04-04 ENCOUNTER — Encounter (INDEPENDENT_AMBULATORY_CARE_PROVIDER_SITE_OTHER): Payer: Self-pay

## 2021-04-05 ENCOUNTER — Ambulatory Visit: Payer: 59 | Admitting: Adult Health

## 2021-04-05 ENCOUNTER — Ambulatory Visit: Payer: 59 | Admitting: Skilled Nursing Facility1

## 2021-04-11 ENCOUNTER — Other Ambulatory Visit: Payer: Self-pay

## 2021-04-11 ENCOUNTER — Encounter: Payer: Self-pay | Admitting: Adult Health

## 2021-04-11 ENCOUNTER — Other Ambulatory Visit (HOSPITAL_COMMUNITY)
Admission: RE | Admit: 2021-04-11 | Discharge: 2021-04-11 | Disposition: A | Discharge status: Home / Self Care | Payer: 59 | Source: Ambulatory Visit | Attending: Adult Health | Admitting: Adult Health

## 2021-04-11 ENCOUNTER — Ambulatory Visit (INDEPENDENT_AMBULATORY_CARE_PROVIDER_SITE_OTHER): Payer: 59 | Admitting: Adult Health

## 2021-04-11 VITALS — BP 107/79 | HR 65 | Ht 59.0 in | Wt 260.5 lb

## 2021-04-11 DIAGNOSIS — N898 Other specified noninflammatory disorders of vagina: Secondary | ICD-10-CM | POA: Diagnosis present

## 2021-04-11 DIAGNOSIS — Z308 Encounter for other contraceptive management: Secondary | ICD-10-CM | POA: Diagnosis not present

## 2021-04-11 MED ORDER — PHEXXI 1.8-1-0.4 % VA GEL: VAGINAL | 99 refills | Status: DC

## 2021-04-11 NOTE — Progress Notes (Signed)
°  Subjective:     Patient ID: Adriana Moon, female   DOB: 05/05/1980, 40 y.o.   MRN: [id number]  HPI Adriana Moon is a 40 year old black female,single, [id number], in complaining of having thick discharge after sex, no odor.  She has lost over 30 lbs going to [facility].  Lab Results  Component Value Date   DIAGPAP  12/17/2018    NEGATIVE FOR INTRAEPITHELIAL LESIONS OR MALIGNANCY.   DIAGPAP  12/17/2018    FUNGAL ORGANISMS PRESENT CONSISTENT WITH CANDIDA SPP.   HPV NOT Detected 12/17/2018   PCP is Darla Lesches NP  Review of Systems +vaginal discharge Reviewed past medical,surgical, social and family history. Reviewed medications and allergies.     Objective:   Physical Exam BP 107/79 (BP Location: Left Arm, Patient Position: Sitting, Cuff Size: Large)    Pulse 65    Ht 4\' 11"  (1.499 m)    Wt 260 lb 8 oz (118.2 kg)    LMP 04/08/2021 (Approximate)    BMI 52.61 kg/m     Skin warm and dry.Pelvic: external genitalia is normal in appearance no lesions, vagina: white discharge without odor,urethra has no lesions or masses noted, cervix:smooth and bulbous, uterus: normal size, shape and contour, non tender, no masses felt, adnexa: no masses or tenderness noted. Bladder is non tender and no masses felt. CV swab obtained.  Fall risk is low  Upstream - 04/11/21 1407       Pregnancy Intention Screening   Does the patient want to become pregnant in the next year? No    Does the patient's partner want to become pregnant in the next year? No    Would the patient like to discuss contraceptive options today? No      Contraception Wrap Up   Current Method No Method - Other Reason   ablation   End Method No Method - Other Reason   ablation/phexxi   Contraception Counseling Provided No            Examination chaperoned by Levy Pupa LPN  Assessment:     1. Vaginal discharge CV swab sent for GC/CHL,trich,BV and yeast  2. Encounter for other contraceptive  management I gave sample of Phexxi Meds ordered this encounter  Medications   Lactic Ac-Citric Ac-Pot Bitart (PHEXXI) 1.8-1-0.4 % GEL    Sig: Place 1 applicator in vagina before up to 1 hour before each act of  vaginal intercourse as needed    Dispense:  5 g    Refill:  PRN    Order Specific Question:   Supervising Provider    Answer:   Florian Buff [[id number]]       Plan:     Follow up prn

## 2021-04-13 LAB — CERVICOVAGINAL ANCILLARY ONLY
Bacterial Vaginitis (gardnerella): NEGATIVE
Candida Glabrata: NEGATIVE
Candida Vaginitis: NEGATIVE
Chlamydia: NEGATIVE
Comment: NEGATIVE
Comment: NEGATIVE
Comment: NEGATIVE
Comment: NEGATIVE
Comment: NEGATIVE
Comment: NORMAL
Neisseria Gonorrhea: NEGATIVE
Trichomonas: NEGATIVE

## 2021-05-08 ENCOUNTER — Ambulatory Visit: Payer: 59 | Admitting: Skilled Nursing Facility1

## 2021-05-09 ENCOUNTER — Ambulatory Visit (INDEPENDENT_AMBULATORY_CARE_PROVIDER_SITE_OTHER): Payer: 59 | Admitting: Family Medicine

## 2021-05-10 ENCOUNTER — Inpatient Hospital Stay: Payer: 59 | Admitting: Hematology and Oncology

## 2021-05-13 NOTE — Progress Notes (Incomplete)
Patient Care Team: Baruch Gouty, FNP as PCP - General (Family Medicine) Gala Romney, Cristopher Estimable, MD as Consulting Physician (Gastroenterology) Nicholas Lose, MD as Consulting Physician (Hematology and Oncology) Dilling[place], Loel Lofty, DO as Attending Physician (Plastic Surgery) Coralie Keens, MD as Consulting Physician (General Surgery) Gery Pray, MD as Consulting Physician (Radiation Oncology)  DIAGNOSIS: No diagnosis found.  SUMMARY OF ONCOLOGIC HISTORY: Oncology History  Breast cancer of lower-outer quadrant of right female breast ([place])  06/17/2007 Surgery   right lumpectomy: Invasive ductal carcinoma 1.8 cm with abundant extracellular mucin 1.8 cm, grade 2, lymphovascular invasion present, margins negative, 7 lymph nodes negative, T1 cN0 stage IA   07/15/2007 - 08/27/2007 Radiation Therapy   adjuvant radiation therapy   09/25/2007 - 09/24/2009 Anti-estrogen oral therapy   Tamoxifen with Zoladex for 2 years patient stopped it because she felt foggy in the head   01/25/2015 Surgery   breast reduction surgery: Right breast atypical ductal hyperplasia microscopic focus left breast benign   02/25/2015 -  Anti-estrogen oral therapy   tamoxifen 20 mg daily restarted   11/03/2018 Relapse/Recurrence   Patient palpated right breast changes. Mammogram showed 4 masses in the right breast measuring 1.0cm at 2 o'clock, 0.8cm at 10 o'clock, and 0.7cm and 0.5cm at 9:30, with no right axillary adenopathy. Biopsy confirmed IDC, grade 2, HER-2 - (1+), ER +100%, PR+ 100%, Ki67 30%.    11/28/2018 Genetic Testing   Negative genetic testing results on the [facility] Common Hereditary Cancers panel. 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  The Common Hereditary Gene Panel offered by [facility] includes sequencing and/or deletion duplication testing of the following 48 genes: APC, ATM, AXIN2, BARD1, BMPR1A, BRCA1, BRCA2, BRIP1, CDH1, CDK4, CDKN2A (p14ARF), CDKN2A  (p16INK4a), CHEK2, CTNNA1, DICER1, EPCAM (Deletion/duplication testing only), GREM1 (promoter region deletion/duplication testing only), KIT, MEN1, MLH1, MSH2, MSH3, MSH6, MUTYH, NBN, NF1, NHTL1, PALB2, PDGFRA, PMS2, POLD1, POLE, PTEN, RAD50, RAD51C, RAD51D, RNF43, SDHB, SDHC, SDHD, SMAD4, SMARCA4. STK11, TP53, TSC1, TSC2, and VHL.  The following genes were evaluated for sequence changes only: SDHA and HOXB13 c.251G>A variant only.    12/22/2018 Surgery   Bilateral mastectomies Ninfa Linden): left breast: benign; right breast: IDC with DCIS, 3cm, clear margins, and one negative right axillary lymph node.    01/23/2019 Oncotype testing   The Oncotype DX score was 18 predicting a risk of outside the breast recurrence over the next 9 years of 5% if the patient's only systemic therapy is tamoxifen for 5 years.     Malignant neoplasm of upper-outer quadrant of right breast in female, estrogen receptor positive ([place])  11/03/2018 Cancer Staging   Staging form: Breast, AJCC 8th Edition - Clinical stage from 11/03/2018: Stage IA (cT1b, cN0, cM0, G2, ER+, PR+, HER2-) - Signed by Gardenia Phlegm, NP on 11/12/2018    11/03/2018 Initial Diagnosis   Palpable abnormality in the right breast status post right lower breast cancer in 2009 and bilateral reduction mammoplasty in 2016, ultrasound revealed 1 cm lobulated hypoechoic mass, additional masses 8 mm, 7 mm and 5 mm were noted.  Biopsy ([id number]) right breast 10 o'clock position: IDC grade 2, ER 100%, PR 5%, Ki-67 30%, HER-2 -1+: Biopsy 2 o'clock position: Fat necrosis   11/28/2018 Genetic Testing   Negative genetic testing results on the [facility] Common Hereditary Cancers panel. 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  The Common Hereditary Gene Panel offered by [facility] includes sequencing and/or deletion  duplication testing of the following 48 genes: APC, ATM, AXIN2, BARD1, BMPR1A, BRCA1, BRCA2, BRIP1, CDH1,  CDK4, CDKN2A (p14ARF), CDKN2A (p16INK4a), CHEK2, CTNNA1, DICER1, EPCAM (Deletion/duplication testing only), GREM1 (promoter region deletion/duplication testing only), KIT, MEN1, MLH1, MSH2, MSH3, MSH6, MUTYH, NBN, NF1, NHTL1, PALB2, PDGFRA, PMS2, POLD1, POLE, PTEN, RAD50, RAD51C, RAD51D, RNF43, SDHB, SDHC, SDHD, SMAD4, SMARCA4. STK11, TP53, TSC1, TSC2, and VHL.  The following genes were evaluated for sequence changes only: SDHA and HOXB13 c.251G>A variant only.    12/22/2018 Surgery   Bilateral mastectomies Evlyn Courier) [phone]): Benign left breast, and in the right breast, invasive ductal carcinoma with DCIS, 3cm, clear margins, and one negative right axillary lymph node.    12/31/2018 Cancer Staging   Staging form: Breast, AJCC 8th Edition - Pathologic stage from 12/31/2018: Stage IA (pT2, pN0, cM0, G2, ER+, PR+, HER2-) - Signed by Nicholas Lose, MD on 12/31/2018    01/23/2019 Oncotype testing   The Oncotype DX score was 18 predicting a risk of outside the breast recurrence over the next 9 years of 5% if the patient's only systemic therapy is tamoxifen for 5 years.     01/2019 - 01/2029 Anti-estrogen oral therapy   Tamoxifen 20 mg   04/06/2019 Surgery   Reconstruction ([facility])     CHIEF COMPLIANT: Follow-up of right breast cancer on tamoxifen  INTERVAL HISTORY: Adriana Moon is a 41 y.o. with above-mentioned history of right breast cancer who underwent bilateral mastectomies with reconstruction and is currently on antiestrogen therapy with tamoxifen. She presents to the clinic today for follow-up.   ALLERGIES:  is allergic to prednisone, other, nsaids, percocet [oxycodone-acetaminophen], and shellfish allergy.  MEDICATIONS:  Current Outpatient Medications  Medication Sig Dispense Refill   celecoxib (CELEBREX) 100 MG capsule Take 1 capsule (100 mg total) by mouth 2 (two) times daily. 120 capsule 1   cetirizine (ZYRTEC) 10 MG tablet Take 1-2 tablets 1-2 times a day as  needed (Patient taking differently: Take 10 mg by mouth every evening.) 180 tablet 3   cyclobenzaprine (FLEXERIL) 10 MG tablet Take 1 tablet by mouth three times daily as needed for muscle spasm 90 tablet 0   escitalopram (LEXAPRO) 20 MG tablet Take 1 tablet (20 mg total) by mouth daily. 90 tablet 0   fluticasone (FLONASE) 50 MCG/ACT nasal spray Place 2 sprays into both nostrils daily. 16 g 6   Insulin Pen Needle (BD PEN NEEDLE NANO 2ND GEN) 32G X 4 MM MISC Use 1 needle daily to inject Victoza. 100 each 0   Lactic Ac-Citric Ac-Pot Bitart (PHEXXI) 1.8-1-0.4 % GEL Place 1 applicator in vagina before up to 1 hour before each act of  vaginal intercourse as needed 5 g PRN   meloxicam (MOBIC) 15 MG tablet Take 1 tablet by mouth once daily 30 tablet 0   metFORMIN (GLUCOPHAGE) 500 MG tablet Take two tablets by mouth once daily- total 1029m/day. 60 tablet 0   Multiple Vitamin (MULTIVITAMIN WITH MINERALS) TABS tablet Take 1 tablet by mouth daily.     omeprazole (PRILOSEC) 40 MG capsule Take 1 capsule (40 mg total) by mouth daily. 90 capsule 0   Spacer/Aero Chamber Mouthpiece MISC 1 each by Does not apply route every 6 (six) hours as needed. 1 each 0   tamoxifen (NOLVADEX) 20 MG tablet Take 1 tablet by mouth once daily (Patient taking differently: Take 20 mg by mouth at bedtime.) 30 tablet 3   tirzepatide (MOUNJARO) 10 MG/0.5ML Pen Inject 10 mg into the skin once a  week. 6 mL 0   Vitamin D, Ergocalciferol, (DRISDOL) 1.25 MG (50000 UNIT) CAPS capsule Take 1 capsule (50,000 Units total) by mouth every 7 (seven) days. 4 capsule 0   No current facility-administered medications for this visit.    PHYSICAL EXAMINATION: ECOG PERFORMANCE STATUS: {CHL ONC ECOG PS:[id number]}  There were no vitals filed for this visit. There were no vitals filed for this visit.  BREAST:*** No palpable masses or nodules in either right or left breasts. No palpable axillary supraclavicular or infraclavicular adenopathy no breast  tenderness or nipple discharge. (exam performed in the presence of a chaperone)  LABORATORY DATA:  I have reviewed the data as listed CMP Latest Ref Rng & Units 12/27/2020 10/11/2020 02/18/2020  Glucose 65 - 99 mg/dL 79 95 91  BUN 6 - 20 mg/dL 13 23(H) 12  Creatinine 0.57 - 1.00 mg/dL 1.23(H) 1.10(H) 1.18  Sodium 134 - 144 mmol/L 141 139 141  Potassium 3.5 - 5.2 mmol/L 5.0 4.1 3.5  Chloride 96 - 106 mmol/L 105 102 107  CO2 20 - 29 mmol/L _0 Calcium 8.7 - 10.2 mg/dL 9.5 9.4 9.1  Total Protein 6.0 - 8.5 g/dL 7.1 - -  Total Bilirubin 0.0 - 1.2 mg/dL 0.4 - -  Alkaline Phos 44 - 121 IU/L 44 - -  AST 0 - 40 IU/L 16 - -  ALT 0 - 32 IU/L 10 - -    Lab Results  Component Value Date   WBC 5.3 10/11/2020   HGB 12.8 10/11/2020   HCT 40.0 10/11/2020   MCV 88.5 10/11/2020   PLT 334 10/11/2020   NEUTROABS 3.9 04/28/2019    ASSESSMENT & PLAN:  No problem-specific Assessment & Plan notes found for this encounter.    No orders of the defined types were placed in this encounter.  The patient has a good understanding of the overall plan. she agrees with it. she will call with any problems that may develop before the next visit here.  Total time spent: *** mins including face to face time and time spent for planning, charting and coordination of care  Rulon Eisenmenger, MD, MPH 05/13/2021  I, Thana Ates, am acting as scribe for Dr. Nicholas Lose.  {insert scribe attestation}

## 2021-05-15 ENCOUNTER — Inpatient Hospital Stay: Payer: 59 | Attending: Hematology and Oncology | Admitting: Hematology and Oncology

## 2021-05-15 ENCOUNTER — Ambulatory Visit: Payer: 59 | Admitting: Podiatry

## 2021-05-15 NOTE — Assessment & Plan Note (Deleted)
12/22/2018: Bilateral mastectomies:benign left breast, and in the right breast, invasive ductal carcinoma with DCIS, 3cm, clear margins, and one negative right axillary lymph node.  Oncotype DX score 18: Distant recurrence at 9 years: 5%  Current treatment: Adjuvant antiestrogen therapy with tamoxifen started 01/29/2019 stopped for 3 months and resumed January 2022 Tamoxifen toxicities: Occasional hot flashes and night sweats but tolerating it well I encouraged her to stay compliant on tamoxifen.  Unfortunately she takes a lot of medications and therefore she gets tired of taking medicines.  Breast cancer surveillance: 1.  Chest exam 05/10/2020: Benign 2. no role of imaging since she had bilateral mastectomies  Return to clinic in 1 year for follow-up

## 2021-05-22 ENCOUNTER — Other Ambulatory Visit: Payer: Self-pay

## 2021-05-22 ENCOUNTER — Ambulatory Visit: Payer: 59 | Admitting: Podiatry

## 2021-05-22 DIAGNOSIS — M722 Plantar fascial fibromatosis: Secondary | ICD-10-CM | POA: Diagnosis not present

## 2021-05-22 NOTE — Progress Notes (Signed)
HPI: 41 y.o. female presenting today for follow-up evaluation of chronic bilateral foot and ankle pain.  Patient states that overall there is some improvement and things have somewhat stabilized.  She does have a longstanding history of chronic bilateral heel pain.  She presents for further treatment and evaluation  Past Medical History:  Diagnosis Date   Angio-edema    Anxiety    manages naturally   Arthritis of both knees    Asthma    Bilateral swelling of feet    Breast cancer (HCC)    twice   Depression    pt. reports that she tends to get down & has had Lexapro in the past but its been taken off her list for now.    Family history of breast cancer    Family history of kidney cancer    Family history of lung cancer    Family history of prostate cancer    GERD (gastroesophageal reflux disease)    IBS (irritable bowel syndrome)    Insulin resistance    Joint pain    Kidney failure    stage 1   Lactose intolerance    Migraines    PCOS (polycystic ovarian syndrome)    PCOS (polycystic ovarian syndrome)    Urticaria    Varicose veins of legs     Past Surgical History:  Procedure Laterality Date   BREAST LUMPECTOMY Right 2009   BREAST RECONSTRUCTION WITH PLACEMENT OF TISSUE EXPANDER AND FLEX HD (ACELLULAR HYDRATED DERMIS) Bilateral 12/22/2018   Procedure: BILATERAL BREAST RECONSTRUCTION WITH PLACEMENT OF TISSUE EXPANDER AND FLEX HD (ACELLULAR HYDRATED DERMIS);  Surgeon: Wallace Going, DO;  Location: [facility];  Service: Plastics;  Laterality: Bilateral;   BREAST REDUCTION SURGERY Bilateral 01/25/2015   Procedure: MAMMARY REDUCTION  (BREAST);  Surgeon: [name], MD;  Location: [facility];  Service: Plastics;  Laterality: Bilateral;   CESAREAN SECTION     x2  2002 & 2006   ECTOPIC PREGNANCY SURGERY  06/2018   R side , done at [facility] Bilateral 12/22/2018   with tissue  expanders   MASTECTOMY W/ SENTINEL NODE BIOPSY Bilateral 12/22/2018   Procedure: BILATERAL MASTECTOMIES WITH RIGHT SENTINEL LYMPH NODE BIOPSY;  Surgeon: Coralie Keens, MD;  Location: [facility];  Service: General;  Laterality: Bilateral;   PLANTAR FASCIA SURGERY Bilateral    REDUCTION MAMMAPLASTY Left    REMOVAL OF BILATERAL TISSUE EXPANDERS WITH PLACEMENT OF BILATERAL BREAST IMPLANTS Bilateral 04/06/2019   Procedure: REMOVAL OF BILATERAL TISSUE EXPANDERS WITH PLACEMENT OF BILATERAL BREAST IMPLANTS;  Surgeon: Wallace Going, DO;  Location: [facility];  Service: Plastics;  Laterality: Bilateral;   RT lumpectomy     TONSILLECTOMY AND ADENOIDECTOMY N/A 10/11/2020   Procedure: TONSILLECTOMY AND ADENOIDECTOMY;  Surgeon: Leta Baptist, MD;  Location: [facility];  Service: ENT;  Laterality: N/A;    Allergies  Allergen Reactions   Prednisone     Lost taste in her mouth, also makes "me crazy & mean"    Other Cough    pollen   Nsaids Hives and Swelling   Percocet [Oxycodone-Acetaminophen] Nausea Only    Very lightheaded.   Shellfish Allergy Hives and Swelling     Physical Exam: General: The patient is alert and oriented x3 in no acute distress.  Dermatology: Skin is warm, dry and supple bilateral lower extremities. Negative for open lesions or macerations.  Vascular: Palpable pedal  pulses bilaterally. Capillary refill within normal limits.  Negative for any significant edema or erythema  Neurological: Light touch and protective threshold grossly intact.  Patient states that she gets a burning sensation to the dorsum of the bilateral feet  Musculoskeletal Exam: There continues to be some pain and tenderness throughout the heels and plantar arch of the foot bilateral.  MRI RT foot wo contrast 09/06/2020: IMPRESSION: 1. No acute abnormality. 2. Mild tibiotalar osteoarthritis. 3. Chronic postsurgical changes of the proximal plantar fascia without tear or acute inflammation.   MRI LT foot wo contrast  09/06/2020: IMPRESSION: 1. No acute abnormality. 2. Chronic postsurgical changes of the proximal plantar fascia without tear or acute inflammation  Assessment: 1.  Burning sensation dorsum of the bilateral feet 2.  Chronic bilateral foot pain/plantar fasciitis bilateral 3. H/o EPF B/L.  DOS: 10/22/2019   Plan of Care:  1. Patient evaluated.  2.  Overall the patient is doing well.  She continues to have some heel and arch pain with tightness throughout the day.  She does work on her feet for 12-hour shifts. 3.  Appointment with Pedorthist for custom molded orthotics 4.  Recommend topical diclofenac to the dorsum of the feet 2 times daily 5.  Handicap parking placard form provided for the patient today  6.  Recommend stretching nightly before the patient gets into bed to see if this helps to alleviate some of the restless legs and feet  7.   Return to clinic as needed      Edrick Kins, DPM [facility]  Dr. Edrick Kins, DPM    [address], [place] [address]                Office [phone]  Fax [phone]

## 2021-05-23 ENCOUNTER — Ambulatory Visit (INDEPENDENT_AMBULATORY_CARE_PROVIDER_SITE_OTHER): Payer: 59 | Admitting: Family Medicine

## 2021-06-01 ENCOUNTER — Ambulatory Visit (INDEPENDENT_AMBULATORY_CARE_PROVIDER_SITE_OTHER): Payer: 59

## 2021-06-01 ENCOUNTER — Other Ambulatory Visit: Payer: Self-pay

## 2021-06-01 DIAGNOSIS — M722 Plantar fascial fibromatosis: Secondary | ICD-10-CM

## 2021-06-01 NOTE — Progress Notes (Signed)
SITUATION Reason for Consult: Evaluation for Bilateral Custom Foot Orthoses Patient / Caregiver Report: Patient is ready for foot orthotics  OBJECTIVE DATA: Patient History / Diagnosis:    ICD-10-CM   1. Plantar fasciitis, left  M72.2     2. Plantar fasciitis, right  M72.2       Current or Previous Devices: Long term FO user  Foot Examination: Skin presentation:   Intact Ulcers & Callousing:   None and no history Toe / Foot Deformities:  Pes planus Weight Bearing Presentation:  Planus Sensation:    Intact  ORTHOTIC RECOMMENDATION Recommended Device: 1x pair of custom functional foot orthotics  GOALS OF ORTHOSES - Reduce Pain - Prevent Foot Deformity - Prevent Progression of Further Foot Deformity - Relieve Pressure - Improve the Overall Biomechanical Function of the Foot and Lower Extremity.  ACTIONS PERFORMED Patient was casted for Foot Orthoses via crush box. Procedure was explained and patient tolerated procedure well. All questions were answered and concerns addressed.  PLAN Potential out of pocket cost was communicated to patient. Casts are to be sent to [facility] for fabrication. Patient is to be called for fitting when devices are ready.

## 2021-06-06 ENCOUNTER — Encounter (INDEPENDENT_AMBULATORY_CARE_PROVIDER_SITE_OTHER): Payer: Self-pay | Admitting: Family Medicine

## 2021-06-06 ENCOUNTER — Other Ambulatory Visit: Payer: Self-pay

## 2021-06-06 ENCOUNTER — Ambulatory Visit (INDEPENDENT_AMBULATORY_CARE_PROVIDER_SITE_OTHER): Payer: 59 | Admitting: Family Medicine

## 2021-06-06 VITALS — BP 123/83 | HR 87 | Temp 98.5°F | Ht 60.0 in | Wt 248.0 lb

## 2021-06-06 DIAGNOSIS — K219 Gastro-esophageal reflux disease without esophagitis: Secondary | ICD-10-CM

## 2021-06-06 DIAGNOSIS — E559 Vitamin D deficiency, unspecified: Secondary | ICD-10-CM

## 2021-06-06 DIAGNOSIS — E669 Obesity, unspecified: Secondary | ICD-10-CM

## 2021-06-06 DIAGNOSIS — F419 Anxiety disorder, unspecified: Secondary | ICD-10-CM

## 2021-06-06 DIAGNOSIS — F32A Depression, unspecified: Secondary | ICD-10-CM

## 2021-06-06 DIAGNOSIS — E88819 Insulin resistance, unspecified: Secondary | ICD-10-CM

## 2021-06-06 DIAGNOSIS — E8881 Metabolic syndrome: Secondary | ICD-10-CM | POA: Diagnosis not present

## 2021-06-06 DIAGNOSIS — Z6841 Body Mass Index (BMI) 40.0 and over, adult: Secondary | ICD-10-CM

## 2021-06-06 MED ORDER — TIRZEPATIDE 10 MG/0.5ML ~~LOC~~ SOAJ: 10.0000 mg | SUBCUTANEOUS | 0 refills | Status: DC

## 2021-06-06 MED ORDER — OMEPRAZOLE 40 MG PO CPDR: 40.0000 mg | DELAYED_RELEASE_CAPSULE | Freq: Every day | ORAL | 0 refills | Status: DC

## 2021-06-06 MED ORDER — ESCITALOPRAM OXALATE 20 MG PO TABS: 20.0000 mg | ORAL_TABLET | Freq: Every day | ORAL | 0 refills | Status: DC

## 2021-06-06 MED ORDER — VITAMIN D (ERGOCALCIFEROL) 1.25 MG (50000 UNIT) PO CAPS: 50000.0000 [IU] | ORAL_CAPSULE | ORAL | 0 refills | Status: DC

## 2021-06-06 NOTE — Progress Notes (Signed)
Chief Complaint:   OBESITY Adriana Moon is here to discuss her progress with her obesity treatment plan along with follow-up of her obesity related diagnoses. Adriana Moon is on Category 1 and keeping a food journal and adhering to recommended goals of 1000-1100 calories and 75-80 grams of protein and states she is following her eating plan approximately 40% of the time. Adriana Moon states she is walking for 10 minutes 4-5 times per week.  Today's visit was #: 23 Starting weight: 260 lbs Starting date: 04/28/2019 Today's weight: 248 lbs Today's date: 06/06/2021  Total lbs lost to date: 12 lbs Total lbs lost since last in-office visit: 16 lbs  Interim History: Adriana Moon has been undergoing the approval process for bariatric surgery but is now having second thoughts.  Her last office visit was 03-08-2021 and she is down 16 lbs today.   Subjective:   1. Insulin resistance Adriana Moon's appetite is well controlled. She is on 10 mg of Mounjaro weekly.   Lab Results  Component Value Date   HGBA1C 5.5 12/27/2020   Lab Results  Component Value Date   INSULIN 9.8 12/27/2020   INSULIN 19.4 04/28/2019    2. Vitamin D deficiency Adriana Moon's Vitamin D is nearly at goal 45.8. She is on weekly prescription Vitamin D.   Lab Results  Component Value Date   VD25OH 45.8 12/27/2020   VD25OH 22.2 (L) 04/28/2019   VD25OH 37 03/10/2010    3. Gastroesophageal reflux disease without esophagitis Adriana Moon notes Adriana Moon has worsened GERD. Prilosec works will for this and she adds in over the counter famotidine as needed.   4. Anxiety and depression Adriana Moon's mood is stable. She is on Adriana Moon 20 mg every day.  Assessment/Plan:   1. Insulin resistance We will refill Mounjaro 10 mg weekly.  - tirzepatide (MOUNJARO) 10 MG/0.5ML Pen; Inject 10 mg into the skin once a week.  Dispense: 2 mL; Refill: 0  2. Vitamin D deficiency We will refill prescription Vitamin D 50,000 IU weekly and Adriana Moon will  follow-up for routine testing of Vitamin D, at least 2-3 times per year to avoid over-replacement.  - Vitamin D, Ergocalciferol, (DRISDOL) 1.25 MG (50000 UNIT) CAPS capsule; Take 1 capsule (50,000 Units total) by mouth every 7 (seven) days.  Dispense: 4 capsule; Refill: 0  3. Gastroesophageal reflux disease without esophagitis We will refill Omeprazole 40 mg daily.  She can continue over-the-counter famotidine per package directions as needed. - omeprazole (PRILOSEC) 40 MG capsule; Take 1 capsule (40 mg total) by mouth daily.  Dispense: 90 capsule; Refill: 0  4. Anxiety and depression We will refill Adriana Moon 20 mg daily. Behavior modification techniques were discussed today to help Adriana Moon deal with her emotional/non-hunger eating behaviors.  Orders and follow up as documented in patient record.   - escitalopram (Adriana Moon) 20 MG tablet; Take 1 tablet (20 mg total) by mouth daily.  Dispense: 90 tablet; Refill: 0  5. Obesity: Current BMI 48.43 Adriana Moon is currently in the action stage of change. As such, her goal is to continue with weight loss efforts. She has agreed to keeping a food journal and adhering to recommended goals of 1100-1200 calories and 75-80 grams of protein.   We discussed bariatric surgery and I advised that it is a very personal decision but wewill support her no matter what she decides. I stressed that surgery is a tool and she must continue good eating and exercise habits to be successful long-term with surgery.  Exercise goals:  As is.  Behavioral  modification strategies: increasing lean protein intake and decreasing simple carbohydrates.  Adriana Moon has agreed to follow-up with our clinic in 3 weeks (fasting).  Objective:   Blood pressure 123/83, pulse 87, temperature 98.5 F (36.9 C), height 5' (1.524 m), weight 248 lb (112.5 kg), SpO2 97 %. Body mass index is 48.43 kg/m.  General: Cooperative, alert, well developed, in no acute distress. HEENT: Conjunctivae and  lids unremarkable. Cardiovascular: Regular rhythm.  Lungs: Normal work of breathing. Neurologic: No focal deficits.   Lab Results  Component Value Date   CREATININE 1.23 (H) 12/27/2020   BUN 13 12/27/2020   NA 141 12/27/2020   K 5.0 12/27/2020   CL 105 12/27/2020   CO2 24 12/27/2020   Lab Results  Component Value Date   ALT 10 12/27/2020   AST 16 12/27/2020   ALKPHOS 44 12/27/2020   BILITOT 0.4 12/27/2020   Lab Results  Component Value Date   HGBA1C 5.5 12/27/2020   HGBA1C 5.4 04/28/2019   HGBA1C 5.5 04/20/2016   Lab Results  Component Value Date   INSULIN 9.8 12/27/2020   INSULIN 19.4 04/28/2019   Lab Results  Component Value Date   TSH 2.210 12/27/2020   Lab Results  Component Value Date   CHOL 170 04/28/2019   HDL 80 04/28/2019   LDLCALC 80 04/28/2019   TRIG 47 04/28/2019   CHOLHDL 2.3 05/08/2017   Lab Results  Component Value Date   VD25OH 45.8 12/27/2020   VD25OH 22.2 (L) 04/28/2019   VD25OH 37 03/10/2010   Lab Results  Component Value Date   WBC 5.3 10/11/2020   HGB 12.8 10/11/2020   HCT 40.0 10/11/2020   MCV 88.5 10/11/2020   PLT 334 10/11/2020   Lab Results  Component Value Date   IRON 82 04/28/2019   TIBC 315 04/28/2019   FERRITIN 153 (H) 04/28/2019   Attestation Statements:   Reviewed by clinician on day of visit: allergies, medications, problem list, medical history, surgical history, family history, social history, and previous encounter notes.  I, Lizbeth Bark, RMA, am acting as Location manager for [employer].  I have reviewed the above documentation for accuracy and completeness, and I agree with the above. -  Georgianne Fick, FNP

## 2021-06-07 ENCOUNTER — Other Ambulatory Visit: Payer: Self-pay | Admitting: Hematology and Oncology

## 2021-06-07 ENCOUNTER — Encounter (INDEPENDENT_AMBULATORY_CARE_PROVIDER_SITE_OTHER): Payer: Self-pay | Admitting: Family Medicine

## 2021-06-08 ENCOUNTER — Encounter (INDEPENDENT_AMBULATORY_CARE_PROVIDER_SITE_OTHER): Payer: Self-pay | Admitting: Family Medicine

## 2021-06-08 NOTE — Telephone Encounter (Signed)
Adriana Moon 

## 2021-06-13 ENCOUNTER — Other Ambulatory Visit: Payer: Self-pay | Admitting: Podiatry

## 2021-06-28 NOTE — Progress Notes (Signed)
Patient Care Team: Rakes, Connye Burkitt, FNP as PCP - General (Family Medicine) Gala Romney, Cristopher Estimable, MD as Consulting Physician (Gastroenterology) Nicholas Lose, MD as Consulting Physician (Hematology and Oncology) Dillingham, Loel Lofty, DO as Attending Physician (Plastic Surgery) Coralie Keens, MD as Consulting Physician (General Surgery) Gery Pray, MD as Consulting Physician (Radiation Oncology)  DIAGNOSIS:    ICD-10-CM   1. Malignant neoplasm of upper-outer quadrant of right breast in female, estrogen receptor positive ([place])  C50.411    Z17.0       SUMMARY OF ONCOLOGIC HISTORY: Oncology History  Breast cancer of lower-outer quadrant of right female breast ([place])  06/17/2007 Surgery   right lumpectomy: Invasive ductal carcinoma 1.8 cm with abundant extracellular mucin 1.8 cm, grade 2, lymphovascular invasion present, margins negative, 7 lymph nodes negative, T1 cN0 stage IA   07/15/2007 - 08/27/2007 Radiation Therapy   adjuvant radiation therapy   09/25/2007 - 09/24/2009 Anti-estrogen oral therapy   Tamoxifen with Zoladex for 2 years patient stopped it because she felt foggy in the head   01/25/2015 Surgery   breast reduction surgery: Right breast atypical ductal hyperplasia microscopic focus left breast benign   02/25/2015 -  Anti-estrogen oral therapy   tamoxifen 20 mg daily restarted   11/03/2018 Relapse/Recurrence   Patient palpated right breast changes. Mammogram showed 4 masses in the right breast measuring 1.0cm at 2 o'clock, 0.8cm at 10 o'clock, and 0.7cm and 0.5cm at 9:30, with no right axillary adenopathy. Biopsy confirmed IDC, grade 2, HER-2 - (1+), ER +100%, PR+ 100%, Ki67 30%.    11/28/2018 Genetic Testing   Negative genetic testing results on the [facility] Common Hereditary Cancers panel. 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  The Common Hereditary Gene Panel offered by [facility] includes sequencing and/or deletion duplication  testing of the following 48 genes: APC, ATM, AXIN2, BARD1, BMPR1A, BRCA1, BRCA2, BRIP1, CDH1, CDK4, CDKN2A (p14ARF), CDKN2A (p16INK4a), CHEK2, CTNNA1, DICER1, EPCAM (Deletion/duplication testing only), GREM1 (promoter region deletion/duplication testing only), KIT, MEN1, MLH1, MSH2, MSH3, MSH6, MUTYH, NBN, NF1, NHTL1, PALB2, PDGFRA, PMS2, POLD1, POLE, PTEN, RAD50, RAD51C, RAD51D, RNF43, SDHB, SDHC, SDHD, SMAD4, SMARCA4. STK11, TP53, TSC1, TSC2, and VHL.  The following genes were evaluated for sequence changes only: SDHA and HOXB13 c.251G>A variant only.    12/22/2018 Surgery   Bilateral mastectomies Ninfa Linden): left breast: benign; right breast: IDC with DCIS, 3cm, clear margins, and one negative right axillary lymph node.    01/23/2019 Oncotype testing   The Oncotype DX score was 18 predicting a risk of outside the breast recurrence over the next 9 years of 5% if the patient's only systemic therapy is tamoxifen for 5 years.     Malignant neoplasm of upper-outer quadrant of right breast in female, estrogen receptor positive ([place])  11/03/2018 Cancer Staging   Staging form: Breast, AJCC 8th Edition - Clinical stage from 11/03/2018: Stage IA (cT1b, cN0, cM0, G2, ER+, PR+, HER2-) - Signed by Gardenia Phlegm, NP on 11/12/2018    11/03/2018 Initial Diagnosis   Palpable abnormality in the right breast status post right lower breast cancer in 2009 and bilateral reduction mammoplasty in 2016, ultrasound revealed 1 cm lobulated hypoechoic mass, additional masses 8 mm, 7 mm and 5 mm were noted.  Biopsy ([id number]) right breast 10 o'clock position: IDC grade 2, ER 100%, PR 5%, Ki-67 30%, HER-2 -1+: Biopsy 2 o'clock position: Fat necrosis   11/28/2018 Genetic Testing   Negative genetic testing results on the [facility] Common  Hereditary Cancers panel. 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  The Common Hereditary Gene Panel offered by [facility] includes sequencing  and/or deletion duplication testing of the following 48 genes: APC, ATM, AXIN2, BARD1, BMPR1A, BRCA1, BRCA2, BRIP1, CDH1, CDK4, CDKN2A (p14ARF), CDKN2A (p16INK4a), CHEK2, CTNNA1, DICER1, EPCAM (Deletion/duplication testing only), GREM1 (promoter region deletion/duplication testing only), KIT, MEN1, MLH1, MSH2, MSH3, MSH6, MUTYH, NBN, NF1, NHTL1, PALB2, PDGFRA, PMS2, POLD1, POLE, PTEN, RAD50, RAD51C, RAD51D, RNF43, SDHB, SDHC, SDHD, SMAD4, SMARCA4. STK11, TP53, TSC1, TSC2, and VHL.  The following genes were evaluated for sequence changes only: SDHA and HOXB13 c.251G>A variant only.    12/22/2018 Surgery   Bilateral mastectomies Evlyn Courier) [phone]): Benign left breast, and in the right breast, invasive ductal carcinoma with DCIS, 3cm, clear margins, and one negative right axillary lymph node.    12/31/2018 Cancer Staging   Staging form: Breast, AJCC 8th Edition - Pathologic stage from 12/31/2018: Stage IA (pT2, pN0, cM0, G2, ER+, PR+, HER2-) - Signed by Nicholas Lose, MD on 12/31/2018    01/23/2019 Oncotype testing   The Oncotype DX score was 18 predicting a risk of outside the breast recurrence over the next 9 years of 5% if the patient's only systemic therapy is tamoxifen for 5 years.     01/2019 - 01/2029 Anti-estrogen oral therapy   Tamoxifen 20 mg   04/06/2019 Surgery   Reconstruction ([place])     CHIEF COMPLIANT: Follow-up of right breast cancer on tamoxifen  INTERVAL HISTORY: Adriana Moon is a 41 y.o. with above-mentioned history of right breast cancer who underwent bilateral mastectomies with reconstruction and is currently on antiestrogen therapy with tamoxifen. She presents to the clinic today for follow-up.   ALLERGIES:  is allergic to prednisone, other, nsaids, percocet [oxycodone-acetaminophen], and shellfish allergy.  MEDICATIONS:  Current Outpatient Medications  Medication Sig Dispense Refill   celecoxib (CELEBREX) 100 MG capsule Take 1 capsule (100 mg  total) by mouth 2 (two) times daily. 120 capsule 1   cetirizine (ZYRTEC) 10 MG tablet Take 1-2 tablets 1-2 times a day as needed (Patient taking differently: Take 10 mg by mouth every evening.) 180 tablet 3   cyclobenzaprine (FLEXERIL) 10 MG tablet Take 1 tablet by mouth three times daily as needed for muscle spasm 90 tablet 0   escitalopram (LEXAPRO) 20 MG tablet Take 1 tablet (20 mg total) by mouth daily. 90 tablet 0   fluticasone (FLONASE) 50 MCG/ACT nasal spray Place 2 sprays into both nostrils daily. 16 g 6   Insulin Pen Needle (BD PEN NEEDLE NANO 2ND GEN) 32G X 4 MM MISC Use 1 needle daily to inject Victoza. 100 each 0   Lactic Ac-Citric Ac-Pot Bitart (PHEXXI) 1.8-1-0.4 % GEL Place 1 applicator in vagina before up to 1 hour before each act of  vaginal intercourse as needed 5 g PRN   meloxicam (MOBIC) 15 MG tablet Take 1 tablet by mouth once daily 30 tablet 0   Multiple Vitamin (MULTIVITAMIN WITH MINERALS) TABS tablet Take 1 tablet by mouth daily.     omeprazole (PRILOSEC) 40 MG capsule Take 1 capsule (40 mg total) by mouth daily. 90 capsule 0   Spacer/Aero Chamber Mouthpiece MISC 1 each by Does not apply route every 6 (six) hours as needed. 1 each 0   tamoxifen (NOLVADEX) 20 MG tablet Take 1 tablet by mouth once daily 30 tablet 0   tirzepatide (MOUNJARO) 10 MG/0.5ML Pen Inject 10 mg into the skin once a  week. 2 mL 0   Vitamin D, Ergocalciferol, (DRISDOL) 1.25 MG (50000 UNIT) CAPS capsule Take 1 capsule (50,000 Units total) by mouth every 7 (seven) days. 4 capsule 0   No current facility-administered medications for this visit.    PHYSICAL EXAMINATION: ECOG PERFORMANCE STATUS: 1 - Symptomatic but completely ambulatory  Vitals:   06/29/21 1513  BP: 127/80  Pulse: 77  Resp: 18  Temp: 97.7 F (36.5 C)  SpO2: 97%   Filed Weights   06/29/21 1513  Weight: 246 lb 12.8 oz (111.9 kg)    BREAST: No palpable masses or nodules in either right or left breasts. No palpable axillary  supraclavicular or infraclavicular adenopathy no breast tenderness or nipple discharge. (exam performed in the presence of a chaperone)  LABORATORY DATA:  I have reviewed the data as listed CMP Latest Ref Rng & Units 12/27/2020 10/11/2020 02/18/2020  Glucose 65 - 99 mg/dL 79 95 91  BUN 6 - 20 mg/dL 13 23(H) 12  Creatinine 0.57 - 1.00 mg/dL 1.23(H) 1.10(H) 1.18  Sodium 134 - 144 mmol/L 141 139 141  Potassium 3.5 - 5.2 mmol/L 5.0 4.1 3.5  Chloride 96 - 106 mmol/L 105 102 107  CO2 20 - 29 mmol/L 24 24 27   Calcium 8.7 - 10.2 mg/dL 9.5 9.4 9.1  Total Protein 6.0 - 8.5 g/dL 7.1 - -  Total Bilirubin 0.0 - 1.2 mg/dL 0.4 - -  Alkaline Phos 44 - 121 IU/L 44 - -  AST 0 - 40 IU/L 16 - -  ALT 0 - 32 IU/L 10 - -    Lab Results  Component Value Date   WBC 5.3 10/11/2020   HGB 12.8 10/11/2020   HCT 40.0 10/11/2020   MCV 88.5 10/11/2020   PLT 334 10/11/2020   NEUTROABS 3.9 04/28/2019    ASSESSMENT & PLAN:  Malignant neoplasm of upper-outer quadrant of right breast in female, estrogen receptor positive ([place]) 12/22/2018: Bilateral mastectomies:benign left breast, and in the right breast, invasive ductal carcinoma with DCIS, 3cm, clear margins, and one negative right axillary lymph node.    Oncotype DX score 18: Distant recurrence at 9 years: 5%   Current treatment: Adjuvant antiestrogen therapy with tamoxifen started 01/29/2019 stopped for 3 months and resumed January 2022 Tamoxifen toxicities: Occasional hot flashes and night sweats but tolerating it well I encouraged her to stay compliant on tamoxifen.  Unfortunately she takes a lot of medications and therefore she gets tired of taking medicines.   Breast cancer surveillance: 1.  Chest exam 06/29/2021: Benign 2. no role of imaging since she had bilateral mastectomies shoulder exam   Obesity: Patient lost 45 pounds on Monjuro  Return to clinic in 1 year for follow-up  No orders of the defined types were placed in this encounter.  The patient  has a good understanding of the overall plan. she agrees with it. she will call with any problems that may develop before the next visit here.  Total time spent: 30 mins including face to face time and time spent for planning, charting and coordination of care  Rulon Eisenmenger, MD, MPH 06/29/2021  I, Thana Ates, am acting as scribe for Dr. Nicholas Lose.  I have reviewed the above documentation for accuracy and completeness, and I agree with the above.

## 2021-06-29 ENCOUNTER — Other Ambulatory Visit: Payer: Self-pay

## 2021-06-29 ENCOUNTER — Inpatient Hospital Stay: Payer: 59 | Attending: Hematology and Oncology | Admitting: Hematology and Oncology

## 2021-06-29 DIAGNOSIS — C50411 Malignant neoplasm of upper-outer quadrant of right female breast: Secondary | ICD-10-CM | POA: Diagnosis not present

## 2021-06-29 DIAGNOSIS — Z79810 Long term (current) use of selective estrogen receptor modulators (SERMs): Secondary | ICD-10-CM | POA: Insufficient documentation

## 2021-06-29 DIAGNOSIS — E669 Obesity, unspecified: Secondary | ICD-10-CM | POA: Insufficient documentation

## 2021-06-29 DIAGNOSIS — Z170 Estrogen receptor positive status [ER+]: Secondary | ICD-10-CM | POA: Diagnosis not present

## 2021-06-29 MED ORDER — TAMOXIFEN CITRATE 20 MG PO TABS: 20.0000 mg | ORAL_TABLET | Freq: Every day | ORAL | 3 refills | Status: DC

## 2021-06-29 NOTE — Assessment & Plan Note (Signed)
12/22/2018: Bilateral mastectomies:benign left breast, and in the right breast, invasive ductal carcinoma with DCIS, 3cm, clear margins, and one negative right axillary lymph node.? ?? ?Oncotype DX score 18: Distant recurrence at 9 years: 5% ?? ?Current treatment: Adjuvant antiestrogen therapy with tamoxifen started 01/29/2019 stopped for 3 months and resumed January 2022 ?Tamoxifen toxicities: Occasional hot flashes and night sweats but tolerating it well ?I encouraged her to stay compliant on tamoxifen.  Unfortunately she takes a lot of medications and therefore she gets tired of taking medicines. ?? ?Breast cancer surveillance: ?1.  Chest exam 06/29/2021: Benign ?? ?Return to clinic in 1 year for follow-up ?

## 2021-06-30 ENCOUNTER — Telehealth: Payer: Self-pay | Admitting: Hematology and Oncology

## 2021-06-30 NOTE — Telephone Encounter (Signed)
Scheduled appointment per 03/08 los. Patient aware.  ? ?

## 2021-07-07 ENCOUNTER — Ambulatory Visit: Payer: 59 | Admitting: Allergy

## 2021-07-10 ENCOUNTER — Encounter (INDEPENDENT_AMBULATORY_CARE_PROVIDER_SITE_OTHER): Payer: Self-pay | Admitting: Adult Health

## 2021-07-10 ENCOUNTER — Other Ambulatory Visit: Payer: Self-pay

## 2021-07-10 ENCOUNTER — Ambulatory Visit
Admission: RE | Admit: 2021-07-10 | Discharge: 2021-07-10 | Disposition: A | Discharge status: Home / Self Care | Payer: 59 | Source: Ambulatory Visit | Attending: Surgery | Admitting: Surgery

## 2021-07-10 ENCOUNTER — Ambulatory Visit (INDEPENDENT_AMBULATORY_CARE_PROVIDER_SITE_OTHER): Payer: 59 | Admitting: Adult Health

## 2021-07-10 VITALS — BP 121/81 | HR 69 | Temp 97.6°F | Ht 60.0 in | Wt 244.0 lb

## 2021-07-10 DIAGNOSIS — Z6841 Body Mass Index (BMI) 40.0 and over, adult: Secondary | ICD-10-CM

## 2021-07-10 DIAGNOSIS — K219 Gastro-esophageal reflux disease without esophagitis: Secondary | ICD-10-CM | POA: Diagnosis not present

## 2021-07-10 DIAGNOSIS — E559 Vitamin D deficiency, unspecified: Secondary | ICD-10-CM | POA: Diagnosis not present

## 2021-07-10 DIAGNOSIS — N189 Chronic kidney disease, unspecified: Secondary | ICD-10-CM | POA: Diagnosis not present

## 2021-07-10 DIAGNOSIS — Z9189 Other specified personal risk factors, not elsewhere classified: Secondary | ICD-10-CM

## 2021-07-10 DIAGNOSIS — E669 Obesity, unspecified: Secondary | ICD-10-CM

## 2021-07-10 DIAGNOSIS — E8881 Metabolic syndrome: Secondary | ICD-10-CM

## 2021-07-10 MED ORDER — TIRZEPATIDE 10 MG/0.5ML ~~LOC~~ SOAJ: 10.0000 mg | SUBCUTANEOUS | 0 refills | Status: DC

## 2021-07-10 NOTE — Progress Notes (Signed)
? ? ? ?Chief Complaint:  ? ?OBESITY ?Adriana Moon is here to discuss her progress with her obesity treatment plan along with follow-up of her obesity related diagnoses. Adriana Moon is on keeping a food journal and adhering to recommended goals of 1100-1200 calories and 75-80 grams of protein and states she is following her eating plan approximately 85% of the time. Adriana Moon states she is walking for 45 minutes 5 times per week. ? ?Today's visit was #: 24 ?Starting weight: 260 lbs ?Starting date: 04/28/2019 ?Today's weight: 244 lbs ?Today's date: 07/10/2021 ?Total lbs lost to date: 16 lbs ?Total lbs lost since last in-office visit: 4 lbs ? ?Interim History:  ?Asees's Victoza was replaced with Mounjaro 7.5 mg - 01/10/2021. ?Mounjaro increased to 7.5 mg to 10 mg on 03/08/2021. ?She injects on Tuesday. ? ?Subjective:  ? ?1. Gastroesophageal reflux disease without esophagitis ?Trigger foods - red sauce,spicy foods. ?Followed by Dr Arbruster/GI -  ?Upper GI endoscopy on 03/03/2020 -  ?Impression: ?- Esophagogastric landmarks identified. ?- 1 cm hiatal hernia. ?- Normal esophagus otherwise - biopsies taken to rule out eosinophilic esophagitis. ?- Normal stomach. Biopsied to rule out H pylori. ?- One small duodenal diverticulum. ?- Normal duodenum otherwise ? ?2. Insulin resistance ?She has previously on metformin. ?Analaura's Victoza was replaced with Mounjaro 7.5 mg - 01/10/2021. ?Mounjaro increased to 7.5 mg to 10 mg on 03/08/2021. ?She injects on Tuesday. ?Her father has T2D - diagnosed in his 93s. ? ?3. Vitamin D deficiency ?She is currently taking prescription ergocalciferol 50,000 IU each week. She denies nausea, vomiting or muscle weakness. ? ?4. Chronic kidney disease, unspecified CKD stage ?Reviewed last several CMPs in Epic- increasing creat (12/27/20 creat 1.23) with decreased GFR - holding in the upper 50s (12/27/20 GFR 58). ?Mobic/Celebrex provided by Ortho for chronic bilateral foot pain. ? ?5. At risk for deficient  intake of food ?Shlonda is at risk for deficient intake of food due to worsening GERD. ? ?Assessment/Plan:  ? ?1. Gastroesophageal reflux disease without esophagitis ?Avoid known food triggers. ?Continue omeprazole 40 mg daily. ?F/u with GI as needed. ? ?2. Insulin resistance ?Check labs today. ?Refill Mounjaro 10 mg once weekly. ? ?- Hemoglobin A1c ?- Insulin, random ?- Refill tirzepatide (MOUNJARO) 10 MG/0.5ML Pen; Inject 10 mg into the skin once a week.  Dispense: 2 mL; Refill: 0 ? ?3. Vitamin D deficiency ?Check vitamin D level today and then MyChart patient with results and ergocalciferol refill. ? ?- VITAMIN D 25 Hydroxy (Vit-D Deficiency, Fractures) ? ?4. Chronic kidney disease, unspecified CKD stage ?Avoid NSAIDs.  Use acetaminophen for pain control. F/u with Orthopedic provider. ?Check labs today. ? ?- Comprehensive metabolic panel ? ?5. At risk for deficient intake of food ?Adriana Moon was given approximately 15 minutes of deficient intake of food prevention counseling today. Adriana Moon is at risk for eating too few calories based on current food recall. She was encouraged to focus on meeting caloric and protein goals according to her recommended meal plan. ? ?6. Obesity: Current BMI 47.8 ? ?Denetria is currently in the action stage of change. As such, her goal is to continue with weight loss efforts. She has agreed to keeping a food journal and adhering to recommended goals of 1100-1200 calories and 75-80 grams of protein.  ? ?Exercise goals:  As is. ? ?Behavioral modification strategies: increasing lean protein intake, decreasing simple carbohydrates, meal planning and cooking strategies, keeping healthy foods in the home, planning for success, and keeping a strict food journal. ? ?Adriana Moon has agreed to  follow-up with our clinic in 3-4 weeks. She was informed of the importance of frequent follow-up visits to maximize her success with intensive lifestyle modifications for her multiple health conditions.   ? ?Adriana Moon was informed we would discuss her lab results at her next visit unless there is a critical issue that needs to be addressed sooner. Adriana Moon agreed to keep her next visit at the agreed upon time to discuss these results. ? ?Objective:  ? ?Blood pressure 121/81, pulse 69, temperature 97.6 ?F (36.4 ?C), height 5' (1.524 m), weight 244 lb (110.7 kg), SpO2 98 %. ?Body mass index is 47.65 kg/m?. ? ?General: Cooperative, alert, well developed, in no acute distress. ?HEENT: Conjunctivae and lids unremarkable. ?Cardiovascular: Regular rhythm.  ?Lungs: Normal work of breathing. ?Neurologic: No focal deficits.  ? ?Lab Results  ?Component Value Date  ? CREATININE 1.23 (H) 12/27/2020  ? BUN 13 12/27/2020  ? NA 141 12/27/2020  ? K 5.0 12/27/2020  ? CL 105 12/27/2020  ? CO2 24 12/27/2020  ? ?Lab Results  ?Component Value Date  ? ALT 10 12/27/2020  ? AST 16 12/27/2020  ? ALKPHOS 44 12/27/2020  ? BILITOT 0.4 12/27/2020  ? ?Lab Results  ?Component Value Date  ? HGBA1C 5.5 12/27/2020  ? HGBA1C 5.4 04/28/2019  ? HGBA1C 5.5 04/20/2016  ? ?Lab Results  ?Component Value Date  ? INSULIN 9.8 12/27/2020  ? INSULIN 19.4 04/28/2019  ? ?Lab Results  ?Component Value Date  ? TSH 2.210 12/27/2020  ? ?Lab Results  ?Component Value Date  ? CHOL 170 04/28/2019  ? HDL 80 04/28/2019  ? [place] 80 04/28/2019  ? TRIG 47 04/28/2019  ? CHOLHDL 2.3 05/08/2017  ? ?Lab Results  ?Component Value Date  ? VD25OH 45.8 12/27/2020  ? VD25OH 22.2 (L) 04/28/2019  ? VD25OH 37 03/10/2010  ? ?Lab Results  ?Component Value Date  ? WBC 5.3 10/11/2020  ? HGB 12.8 10/11/2020  ? HCT 40.0 10/11/2020  ? MCV 88.5 10/11/2020  ? PLT 334 10/11/2020  ? ?Lab Results  ?Component Value Date  ? IRON 82 04/28/2019  ? TIBC 315 04/28/2019  ? FERRITIN 153 (H) 04/28/2019  ? ?Attestation Statements:  ? ?Reviewed by clinician on day of visit: allergies, medications, problem list, medical history, surgical history, family history, social history, and previous encounter  notes. ? ?I, Water quality scientist, CMA, am acting as Location manager for Mina Marble, NP. ? ?I have reviewed the above documentation for accuracy and completeness, and I agree with the above. -  Khali Albanese d. Dasie Chancellor, NP-C ?

## 2021-07-11 LAB — INSULIN, RANDOM: INSULIN: 11.2 u[IU]/mL (ref 2.6–24.9)

## 2021-07-11 LAB — COMPREHENSIVE METABOLIC PANEL
ALT: 10 IU/L (ref 0–32)
AST: 16 IU/L (ref 0–40)
Albumin/Globulin Ratio: 1.5 (ref 1.2–2.2)
Albumin: 4.1 g/dL (ref 3.8–4.8)
Alkaline Phosphatase: 48 IU/L (ref 44–121)
BUN/Creatinine Ratio: 10 (ref 9–23)
BUN: 10 mg/dL (ref 6–20)
Bilirubin Total: 0.3 mg/dL (ref 0.0–1.2)
CO2: 25 mmol/L (ref 20–29)
Calcium: 9 mg/dL (ref 8.7–10.2)
Chloride: 105 mmol/L (ref 96–106)
Creatinine, Ser: 1.01 mg/dL — ABNORMAL HIGH (ref 0.57–1.00)
Globulin, Total: 2.7 g/dL (ref 1.5–4.5)
Glucose: 71 mg/dL (ref 70–99)
Potassium: 4.1 mmol/L (ref 3.5–5.2)
Sodium: 143 mmol/L (ref 134–144)
Total Protein: 6.8 g/dL (ref 6.0–8.5)
eGFR: 73 mL/min/{1.73_m2} (ref >59)

## 2021-07-11 LAB — VITAMIN D 25 HYDROXY (VIT D DEFICIENCY, FRACTURES): Vit D, 25-Hydroxy: 46.8 ng/mL (ref 30.0–100.0)

## 2021-07-11 LAB — HEMOGLOBIN A1C
Est. average glucose Bld gHb Est-mCnc: 108 mg/dL
Hgb A1c MFr Bld: 5.4 % (ref 4.8–5.6)

## 2021-07-24 ENCOUNTER — Other Ambulatory Visit (HOSPITAL_COMMUNITY): Payer: Self-pay | Admitting: Surgery

## 2021-08-15 ENCOUNTER — Ambulatory Visit (INDEPENDENT_AMBULATORY_CARE_PROVIDER_SITE_OTHER): Payer: 59 | Admitting: Adult Health

## 2021-08-15 ENCOUNTER — Encounter (INDEPENDENT_AMBULATORY_CARE_PROVIDER_SITE_OTHER): Payer: Self-pay | Admitting: Adult Health

## 2021-08-15 VITALS — BP 117/80 | HR 86 | Temp 98.0°F | Ht 60.0 in | Wt 239.0 lb

## 2021-08-15 DIAGNOSIS — E669 Obesity, unspecified: Secondary | ICD-10-CM

## 2021-08-15 DIAGNOSIS — E88819 Insulin resistance, unspecified: Secondary | ICD-10-CM

## 2021-08-15 DIAGNOSIS — F32A Depression, unspecified: Secondary | ICD-10-CM

## 2021-08-15 DIAGNOSIS — E559 Vitamin D deficiency, unspecified: Secondary | ICD-10-CM

## 2021-08-15 DIAGNOSIS — F419 Anxiety disorder, unspecified: Secondary | ICD-10-CM | POA: Diagnosis not present

## 2021-08-15 DIAGNOSIS — K219 Gastro-esophageal reflux disease without esophagitis: Secondary | ICD-10-CM

## 2021-08-15 DIAGNOSIS — Z6841 Body Mass Index (BMI) 40.0 and over, adult: Secondary | ICD-10-CM

## 2021-08-15 DIAGNOSIS — E8881 Metabolic syndrome: Secondary | ICD-10-CM | POA: Diagnosis not present

## 2021-08-15 DIAGNOSIS — Z9189 Other specified personal risk factors, not elsewhere classified: Secondary | ICD-10-CM

## 2021-08-15 MED ORDER — VITAMIN D (ERGOCALCIFEROL) 1.25 MG (50000 UNIT) PO CAPS: 50000.0000 [IU] | ORAL_CAPSULE | ORAL | 0 refills | Status: DC

## 2021-08-15 MED ORDER — OMEPRAZOLE 40 MG PO CPDR: 40.0000 mg | DELAYED_RELEASE_CAPSULE | Freq: Every day | ORAL | 0 refills | Status: DC

## 2021-08-15 MED ORDER — ESCITALOPRAM OXALATE 20 MG PO TABS: 20.0000 mg | ORAL_TABLET | Freq: Every day | ORAL | 0 refills | Status: DC

## 2021-08-15 MED ORDER — TIRZEPATIDE 10 MG/0.5ML ~~LOC~~ SOAJ: 10.0000 mg | SUBCUTANEOUS | 0 refills | Status: DC

## 2021-08-27 NOTE — Progress Notes (Signed)
? ? ? ?Chief Complaint:  ? ?OBESITY ?Adriana Moon is here to discuss her progress with her obesity treatment plan along with follow-up of her obesity related diagnoses. Adriana Moon is on 1100-1200 calories and 75-80 grams protein / Category 1 and states she is following her eating plan approximately 40% of the time. Adriana Moon states she is walking 10 minutes 5 times per week. ? ?Today's visit was #: 25 ?Starting weight: 260 ?Starting date: 04/28/2019 ?Today's weight: 239 ?Today's date:08/15/2021 ?Total lbs lost to date: 21 ?Total lbs lost since last in-office visit: 5 ? ?Interim History:  ?Adriana Moon is only able to consume 40 % of the food on the plan.   ?Mounjaro 10 mg has decreased caloric intake and sugar cravings.   ? 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   ?She endorses extreme fatigue.   ? ?Subjective:  ? ?1. Insulin resistance ?Discussed labs with patient from 07/10/21 Blood glucose 71, A1C 5.4 and Insulin level 11.2 ?Adriana Moon is injecting Mounjaro 10 mg on Sunday- denies mass in neck, dysphagia, dyspepsia, persistent hoarseness, abd pain, or N/V/Constipation. ? ?2. Vitamin D deficiency ?Discussed labs with patient from 07/10/21.  Vitamin D level 46.8, stable ? ?3. Anxiety and depression ?Adriana Moon reports stable mood and denies suicidal or homicidal ideations. She is on Lexapro 20 mg daily ? ?4. Gastroesophageal reflux disease without esophagitis ?Last EGD on 03/03/2020: ?Impression: ?- Esophagogastric landmarks identified. ?- 1 cm hiatal hernia. ?- Normal esophagus otherwise - biopsies taken to rule out eosinophilic esophagitis. ?- Normal stomach. Biopsied to rule out H pylori. ?- One small duodenal diverticulum. ?- Normal duodenum otherwise ? ?5. At risk for deficient intake of food ?The patient is at a higher than average risk of deficient intake of food due to taking Mounjaro with obesity. ? ? ?Assessment/Plan:  ? ?1. Insulin resistance ?We will refill Mounjaro today. ?- tirzepatide  (MOUNJARO) 10 MG/0.5ML Pen; Inject 10 mg into the skin once a week.  Dispense: 2 mL; Refill: 0 ? ?2. Vitamin D deficiency ?We will refill Vitamin D, Ergocalciferol.  ? ?- Vitamin D, Ergocalciferol, (DRISDOL) 1.25 MG (50000 UNIT) CAPS capsule; Take 1 capsule (50,000 Units total) by mouth every 7 (seven) days.  Dispense: 4 capsule; Refill: 0 ? ?3. Anxiety and depression ?Will refill Lexapro today. ?- escitalopram (LEXAPRO) 20 MG tablet; Take 1 tablet (20 mg total) by mouth daily.  Dispense: 90 tablet; Refill: 0 ? ?4. Gastroesophageal reflux disease without esophagitis ?Avoid trigger foods. ?Do not overeat, or too close to bedtime. ? ?We will refill Prilosec today. ?- omeprazole (PRILOSEC) 40 MG capsule; Take 1 capsule (40 mg total) by mouth daily.  Dispense: 90 capsule; Refill: 0 ? ?5. At risk for deficient intake of food ?The patient is at a higher than average risk of deficient intake of food due to taking Mounjaro with obesity.  ? ?6. Obesity: Current BMI 46.8 ? ? ?Adriana Moon is currently in the action stage of change. As such, her goal is to continue with weight loss efforts. She has agreed to the Category 1 Plan.  ? ?Exercise goals: As is ? ?Behavioral modification strategies: increasing lean protein intake, decreasing simple carbohydrates, meal planning and cooking strategies, keeping healthy foods in the home, and planning for success. ? ?Adriana Moon has agreed to follow-up with our clinic in 4 weeks. She was informed of the importance of frequent follow-up visits to maximize her success with intensive lifestyle modifications for her multiple health conditions.  ? ? ?Objective:  ? ?Blood pressure 117/80,  pulse 86, temperature 98 ?F (36.7 ?C), height 5' (1.524 m), weight 239 lb (108.4 kg), SpO2 98 %. ?Body mass index is 46.68 kg/m?. ? ?General: Cooperative, alert, well developed, in no acute distress. ?HEENT: Conjunctivae and lids unremarkable. ?Cardiovascular: Regular rhythm.  ?Lungs: Normal work of  breathing. ?Neurologic: No focal deficits.  ? ?Lab Results  ?Component Value Date  ? CREATININE 1.01 (H) 07/10/2021  ? BUN 10 07/10/2021  ? NA 143 07/10/2021  ? K 4.1 07/10/2021  ? CL 105 07/10/2021  ? CO2 25 07/10/2021  ? ?Lab Results  ?Component Value Date  ? ALT 10 07/10/2021  ? AST 16 07/10/2021  ? ALKPHOS 48 07/10/2021  ? BILITOT 0.3 07/10/2021  ? ?Lab Results  ?Component Value Date  ? HGBA1C 5.4 07/10/2021  ? HGBA1C 5.5 12/27/2020  ? HGBA1C 5.4 04/28/2019  ? HGBA1C 5.5 04/20/2016  ? ?Lab Results  ?Component Value Date  ? INSULIN 11.2 07/10/2021  ? INSULIN 9.8 12/27/2020  ? INSULIN 19.4 04/28/2019  ? ?Lab Results  ?Component Value Date  ? TSH 2.210 12/27/2020  ? ?Lab Results  ?Component Value Date  ? CHOL 170 04/28/2019  ? HDL 80 04/28/2019  ? [place] 80 04/28/2019  ? TRIG 47 04/28/2019  ? CHOLHDL 2.3 05/08/2017  ? ?Lab Results  ?Component Value Date  ? VD25OH 46.8 07/10/2021  ? VD25OH 45.8 12/27/2020  ? VD25OH 22.2 (L) 04/28/2019  ? ?Lab Results  ?Component Value Date  ? WBC 5.3 10/11/2020  ? HGB 12.8 10/11/2020  ? HCT 40.0 10/11/2020  ? MCV 88.5 10/11/2020  ? PLT 334 10/11/2020  ? ?Lab Results  ?Component Value Date  ? IRON 82 04/28/2019  ? TIBC 315 04/28/2019  ? FERRITIN 153 (H) 04/28/2019  ? ? ? ?Attestation Statements:  ? ?Reviewed by clinician on day of visit: allergies, medications, problem list, medical history, surgical history, family history, social history, and previous encounter notes. ? ?I, Althea Charon, am acting as Location manager for Mina Marble, NP. ? ?I have reviewed the above documentation for accuracy and completeness, and I agree with the above. -  Kysa Calais d. Leah Thornberry, NP-C ? ?

## 2021-08-30 ENCOUNTER — Encounter: Payer: Self-pay | Admitting: Podiatry

## 2021-09-01 ENCOUNTER — Ambulatory Visit (INDEPENDENT_AMBULATORY_CARE_PROVIDER_SITE_OTHER): Payer: 59

## 2021-09-01 DIAGNOSIS — M722 Plantar fascial fibromatosis: Secondary | ICD-10-CM

## 2021-09-01 NOTE — Progress Notes (Signed)
SITUATION: ?Reason for Visit: Fitting and Delivery of Custom Fabricated Foot Orthoses ?Patient Report: Patient reports comfort and is satisfied with device. ? ?OBJECTIVE DATA: ?Patient History / Diagnosis:   ?  ICD-10-CM   ?1. Plantar fasciitis, left  M72.2   ?  ?2. Plantar fasciitis, right  M72.2   ?  ? ? ?Provided Device:  Custom Functional Foot Orthotics ?    [facility]: [id number] ? ?GOAL OF ORTHOSIS ?- Improve gait ?- Decrease energy expenditure ?- Improve Balance ?- Provide Triplanar stability of foot complex ?- Facilitate motion ? ?ACTIONS PERFORMED ?Patient was fit with foot orthotics trimmed to shoe last. Patient tolerated fittign procedure.  ? ?Patient was provided with verbal and written instruction and demonstration regarding donning, doffing, wear, care, proper fit, function, purpose, cleaning, and use of the orthosis and in all related precautions and risks and benefits regarding the orthosis. ? ?Patient was also provided with verbal instruction regarding how to report any failures or malfunctions of the orthosis and necessary follow up care. Patient was also instructed to contact our office regarding any change in status that may affect the function of the orthosis. ? ?Patient demonstrated independence with proper donning, doffing, and fit and verbalized understanding of all instructions. ? ?PLAN: ?Patient is to follow up in one week or as necessary (PRN). All questions were answered and concerns addressed. Plan of care was discussed with and agreed upon by the patient. ? ?

## 2021-09-11 ENCOUNTER — Telehealth (INDEPENDENT_AMBULATORY_CARE_PROVIDER_SITE_OTHER): Payer: 59 | Admitting: Family Medicine

## 2021-09-11 ENCOUNTER — Encounter (INDEPENDENT_AMBULATORY_CARE_PROVIDER_SITE_OTHER): Payer: Self-pay | Admitting: Family Medicine

## 2021-09-11 DIAGNOSIS — E8881 Metabolic syndrome: Secondary | ICD-10-CM

## 2021-09-11 DIAGNOSIS — E559 Vitamin D deficiency, unspecified: Secondary | ICD-10-CM

## 2021-09-11 DIAGNOSIS — Z6841 Body Mass Index (BMI) 40.0 and over, adult: Secondary | ICD-10-CM | POA: Diagnosis not present

## 2021-09-11 DIAGNOSIS — E669 Obesity, unspecified: Secondary | ICD-10-CM | POA: Diagnosis not present

## 2021-09-11 DIAGNOSIS — E88819 Insulin resistance, unspecified: Secondary | ICD-10-CM

## 2021-09-11 MED ORDER — VITAMIN D (ERGOCALCIFEROL) 1.25 MG (50000 UNIT) PO CAPS: 50000.0000 [IU] | ORAL_CAPSULE | ORAL | 0 refills | Status: DC

## 2021-09-11 MED ORDER — TIRZEPATIDE 12.5 MG/0.5ML ~~LOC~~ SOAJ: 12.5000 mg | SUBCUTANEOUS | 0 refills | Status: DC

## 2021-09-11 MED ORDER — TIRZEPATIDE 12.5 MG/0.5ML ~~LOC~~ SOAJ: 12.5000 mg | SUBCUTANEOUS | 1 refills | Status: DC

## 2021-09-11 NOTE — Progress Notes (Signed)
TeleHealth Visit:  This visit was completed with telemedicine (audio/video) technology. Adriana Moon has verbally consented to this TeleHealth visit. The patient is located at home, the provider is located at home. The participants in this visit include the listed provider and patient. The visit was conducted today via MyChart video.  OBESITY [name] is here to discuss her progress with her obesity treatment plan along with follow-up of her obesity related diagnoses.   Today's visit was # 37 Starting weight: 260 Starting date: 04/28/2019 Weight at last in office visit: 239 lbs on 08/15/21 Total weight loss: 21 lbs at last in office visit on 08/15/21. No weight reported.  Nutrition Plan: the Category 1 Plan. 45% adherence -skips meals Hunger is well controlled. Cravings are moderately controlled.   Interim History: Adriana Moon says her biggest barrier to adhering better to plan is meal skipping.  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  As an example she had an ice cream drumstick this morning rather than eating a meal. She is in the approval process for bariatric surgery by Dr. Romana Juniper.  They are planning Roux-en-Y gastric bypass.  Adriana Moon is changing jobs.  She will be without insurance from June 13 to approximately July 13.  Assessment/Plan:  1. Insulin Resistance Carolann has had elevated fasting insulin readings. Goal is HgbA1c < 5.7, fasting insulin closer to 5.   She denies polyphagia. Medication(s): Mounjaro 10 mg weekly. Lab Results  Component Value Date   HGBA1C 5.4 07/10/2021   Lab Results  Component Value Date   INSULIN 11.2 07/10/2021   INSULIN 9.8 12/27/2020   INSULIN 19.4 04/28/2019    Plan Increase dose of Mounjaro to 12.5 mg weekly.   2. Vitamin D Deficiency Vitamin D is not at goal of 50. She is on weekly prescription Vitamin D 50,000 IU.  Lab Results  Component Value Date   VD25OH 46.8 07/10/2021   VD25OH 45.8 12/27/2020   VD25OH 22.2 (L)  04/28/2019    Plan: Refill prescription vitamin D 50,000 IU weekly.   3. Obesity: Current BMI 46.68 [name] is currently in the action stage of change. As such, her goal is to continue with weight loss efforts.  She has agreed to the Category 1 Plan and keeping a food journal and adhering to recommended goals of 1100-1200 calories and 75 gms  protein.   Weigh every 2 weeks during her hiatus from the clinic.  Exercise goals: All adults should avoid inactivity. Some physical activity is better than none, and adults who participate in any amount of physical activity gain some health benefits.  Behavioral modification strategies: increasing lean protein intake, decreasing simple carbohydrates, no skipping meals, and keeping a strict food journal. Discussed the importance of adequate protein intake and regular meals for maintenance of metabolism and weight loss.  [name] has agreed to follow-up with our clinic in 6 weeks or after her health insurance starts at her new job. She will call to schedule her appointment.  No orders of the defined types were placed in this encounter.   Medications Discontinued During This Encounter  Medication Reason   tirzepatide (MOUNJARO) 10 MG/0.5ML Pen Dose change   Vitamin D, Ergocalciferol, (DRISDOL) 1.25 MG (50000 UNIT) CAPS capsule Reorder   tirzepatide (MOUNJARO) 12.5 MG/0.5ML Pen Reorder   tirzepatide (MOUNJARO) 12.5 MG/0.5ML Pen Reorder     Meds ordered this encounter  Medications   DISCONTD: tirzepatide (MOUNJARO) 12.5 MG/0.5ML Pen    Sig: Inject 12.5 mg into the skin once a week.  Dispense:  6 mL    Refill:  0    Order Specific Question:   Supervising Provider    Answer:   Dennard Nip D [[id number]]   Vitamin D, Ergocalciferol, (DRISDOL) 1.25 MG (50000 UNIT) CAPS capsule    Sig: Take 1 capsule (50,000 Units total) by mouth every 7 (seven) days.    Dispense:  12 capsule    Refill:  0    Order Specific Question:   Supervising Provider     Answer:   Starlyn Skeans [[id number]]   DISCONTD: tirzepatide (MOUNJARO) 12.5 MG/0.5ML Pen    Sig: Inject 12.5 mg into the skin once a week.    Dispense:  2 mL    Refill:  1    Order Specific Question:   Supervising Provider    Answer:   Dennard Nip D [[id number]]   tirzepatide (MOUNJARO) 12.5 MG/0.5ML Pen    Sig: Inject 12.5 mg into the skin once a week.    Dispense:  6 mL    Refill:  0    Order Specific Question:   Supervising Provider    Answer:   Dennard Nip D [[id number]]      Objective:   VITALS: Per patient if applicable, see vitals. GENERAL: Alert and in no acute distress. CARDIOPULMONARY: No increased WOB. Speaking in clear sentences.  PSYCH: Pleasant and cooperative. Speech normal rate and rhythm. Affect is appropriate. Insight and judgement are appropriate. Attention is focused, linear, and appropriate.  NEURO: Oriented as arrived to appointment on time with no prompting.   Lab Results  Component Value Date   CREATININE 1.01 (H) 07/10/2021   BUN 10 07/10/2021   NA 143 07/10/2021   K 4.1 07/10/2021   CL 105 07/10/2021   CO2 25 07/10/2021   Lab Results  Component Value Date   ALT 10 07/10/2021   AST 16 07/10/2021   ALKPHOS 48 07/10/2021   BILITOT 0.3 07/10/2021   Lab Results  Component Value Date   HGBA1C 5.4 07/10/2021   HGBA1C 5.5 12/27/2020   HGBA1C 5.4 04/28/2019   HGBA1C 5.5 04/20/2016   Lab Results  Component Value Date   INSULIN 11.2 07/10/2021   INSULIN 9.8 12/27/2020   INSULIN 19.4 04/28/2019   Lab Results  Component Value Date   TSH 2.210 12/27/2020   Lab Results  Component Value Date   CHOL 170 04/28/2019   HDL 80 04/28/2019   LDLCALC 80 04/28/2019   TRIG 47 04/28/2019   CHOLHDL 2.3 05/08/2017   Lab Results  Component Value Date   WBC 5.3 10/11/2020   HGB 12.8 10/11/2020   HCT 40.0 10/11/2020   MCV 88.5 10/11/2020   PLT 334 10/11/2020   Lab Results  Component Value Date   IRON 82 04/28/2019   TIBC 315 04/28/2019    FERRITIN 153 (H) 04/28/2019   Lab Results  Component Value Date   VD25OH 46.8 07/10/2021   VD25OH 45.8 12/27/2020   VD25OH 22.2 (L) 04/28/2019    Attestation Statements:   Reviewed by clinician on day of visit: allergies, medications, problem list, medical history, surgical history, family history, social history, and previous encounter notes.

## 2021-09-12 ENCOUNTER — Ambulatory Visit (HOSPITAL_COMMUNITY)
Admission: RE | Admit: 2021-09-12 | Discharge: 2021-09-12 | Disposition: A | Discharge status: Home / Self Care | Payer: 59 | Source: Ambulatory Visit | Attending: Surgery | Admitting: Surgery

## 2021-09-12 DIAGNOSIS — Z01818 Encounter for other preprocedural examination: Secondary | ICD-10-CM | POA: Diagnosis present

## 2021-10-26 ENCOUNTER — Other Ambulatory Visit (INDEPENDENT_AMBULATORY_CARE_PROVIDER_SITE_OTHER): Payer: Self-pay | Admitting: Family Medicine

## 2021-10-26 DIAGNOSIS — E8881 Metabolic syndrome: Secondary | ICD-10-CM

## 2021-11-02 ENCOUNTER — Telehealth (INDEPENDENT_AMBULATORY_CARE_PROVIDER_SITE_OTHER): Payer: Self-pay | Admitting: Family Medicine

## 2021-11-02 NOTE — Telephone Encounter (Signed)
Pt called to remind provider she is switching jobs and insurance. Pt states this is why she needs 10 mg tirzepatide prescribed

## 2021-11-06 ENCOUNTER — Other Ambulatory Visit (INDEPENDENT_AMBULATORY_CARE_PROVIDER_SITE_OTHER): Payer: Self-pay | Admitting: Family Medicine

## 2021-11-06 DIAGNOSIS — E88819 Insulin resistance, unspecified: Secondary | ICD-10-CM

## 2021-11-06 DIAGNOSIS — E8881 Metabolic syndrome: Secondary | ICD-10-CM

## 2021-11-06 MED ORDER — TIRZEPATIDE 10 MG/0.5ML ~~LOC~~ SOAJ: 10.0000 mg | SUBCUTANEOUS | 0 refills | Status: DC

## 2021-11-06 NOTE — Telephone Encounter (Signed)
Please review

## 2021-11-09 ENCOUNTER — Telehealth (INDEPENDENT_AMBULATORY_CARE_PROVIDER_SITE_OTHER): Payer: Self-pay | Admitting: Family Medicine

## 2021-11-09 ENCOUNTER — Encounter (INDEPENDENT_AMBULATORY_CARE_PROVIDER_SITE_OTHER): Payer: Self-pay

## 2021-11-09 NOTE — Telephone Encounter (Signed)
Dawn [employer] - Prior authorization denied for [employer]. Per insurance: Patient does not have type 2 diabetes. Patient already uses copay card. Patient sent denial message via mychart.

## 2021-11-29 ENCOUNTER — Encounter (INDEPENDENT_AMBULATORY_CARE_PROVIDER_SITE_OTHER): Payer: Self-pay

## 2021-12-26 NOTE — Progress Notes (Signed)
TeleHealth Visit:  This visit was completed with telemedicine (audio/video) technology. Adriana Moon has verbally consented to this TeleHealth visit. The patient is located at home, the provider is located at home. The participants in this visit include the listed provider and patient. The visit was conducted today via MyChart video.  OBESITY Adriana Moon is here to discuss her progress with her obesity treatment plan along with follow-up of her obesity related diagnoses.   Today's visit was # 27 Starting weight: 260 Starting date: 04/28/2019 Weight at last in office visit: 239 lbs on 08/15/21 Total weight loss: 21 lbs at last in office visit on 08/15/21. Today's reported weight: 276 lbs lbs  Weight change since last visit: +37  Nutrition Plan: Has not been following a plan  Current exercise: none  Interim History: Adriana Moon has gained 37 pounds since her last in office appointment on 08/15/2021.  She started a new job this summer with [employer] in [place] and had to travel to attend training.  She has not had health insurance until recently.  She is having a lot of hunger and cravings-especially for sweets.  She reports that she has been out of control with her eating.  She has been off of Mounjaro for 2 months.  She is in the approval process for bariatric surgery by Dr. Phylliss Blakes.  They are planning Roux-en-Y gastric bypass.  This was put on hold over the summer while she was waiting on her new health insurance to start.  Her next step is a psychological evaluation and she hopes to have surgery after the first of the year.  Assessment/Plan:  1. Polyphagia Adriana Moon endorses excessive hunger and cravings Medication(s): none.  She had been on Mounjaro until 2 months ago. She is interested in starting phentermine since Wegovy and Saxenda are currently hard to get.  She is unsure if she has coverage for antiobesity medications.  Plan: New prescription for Wegovy 0.5 mg weekly. I advised her  that Reginal Lutes is a better choice for her if we can get coverage.  2. Vitamin D Deficiency Vitamin D is nearly at goal-level was 46.8 on 07/10/2021. Has been off prescription vitamin D. Lab Results  Component Value Date   VD25OH 46.8 07/10/2021   VD25OH 45.8 12/27/2020   VD25OH 22.2 (L) 04/28/2019   Plan: Restart prescription vitamin D 50,000 IU weekly.  3.  Anxiety and depression Mood stable.  Taking Lexapro 20 mg daily.  Plan: Refill Lexapro 20 mg daily.  4. GERD Has had severe issues with GERD in the past.  Currently well controlled with omeprazole 40 mg daily.  She occasionally takes OTC famotidine for breakthrough issues.  Sometimes has issues with reflux at night.  Plan: Refill omeprazole 20 mg daily. Avoid trigger foods. Avoid eating within 3 hours of bedtime. May take Tums and or Pepcid along with omeprazole if needed.   5. Obesity: Current BMI 46.68 Adriana Moon is currently in the action stage of change. As such, her goal is to continue with weight loss efforts.  She has agreed to keeping a food journal and adhering to recommended goals of 1100-1200 calories and 75 gms protein.   Exercise goals: No exercise has been prescribed at this time.  Behavioral modification strategies: increasing lean protein intake, decreasing simple carbohydrates, decreasing eating out, planning for success, and keeping a strict food journal.  Adriana Moon has agreed to follow-up with our clinic in 4 weeks.   No orders of the defined types were placed in this encounter.   Medications Discontinued  During This Encounter  Medication Reason   tirzepatide [facility]) 10 MG/0.5ML Pen Change in therapy   escitalopram (LEXAPRO) 20 MG tablet Reorder   omeprazole (PRILOSEC) 40 MG capsule Reorder   Vitamin D, Ergocalciferol, (DRISDOL) 1.25 MG (50000 UNIT) CAPS capsule Reorder   Insulin Pen Needle (BD PEN NEEDLE NANO 2ND GEN) 32G X 4 MM MISC Change in therapy     Meds ordered this encounter   Medications   Semaglutide-Weight Management (WEGOVY) 0.5 MG/0.5ML SOAJ    Sig: Inject 0.5 mg into the skin once a week.    Dispense:  2 mL    Refill:  0    Order Specific Question:   Supervising Provider    Answer:   Quillian Quince D [[id number]]   Vitamin D, Ergocalciferol, (DRISDOL) 1.25 MG (50000 UNIT) CAPS capsule    Sig: Take 1 capsule (50,000 Units total) by mouth every 7 (seven) days.    Dispense:  12 capsule    Refill:  0    Order Specific Question:   Supervising Provider    Answer:   Quillian Quince D [[id number]]   escitalopram (LEXAPRO) 20 MG tablet    Sig: Take 1 tablet (20 mg total) by mouth daily.    Dispense:  90 tablet    Refill:  0    Order Specific Question:   Supervising Provider    Answer:   Quillian Quince D [[id number]]   omeprazole (PRILOSEC) 40 MG capsule    Sig: Take 1 capsule (40 mg total) by mouth daily.    Dispense:  90 capsule    Refill:  0    Order Specific Question:   Supervising Provider    Answer:   Quillian Quince D [[id number]]      Objective:   VITALS: Per patient if applicable, see vitals. GENERAL: Alert and in no acute distress. CARDIOPULMONARY: No increased WOB. Speaking in clear sentences.  PSYCH: Pleasant and cooperative. Speech normal rate and rhythm. Affect is appropriate. Insight and judgement are appropriate. Attention is focused, linear, and appropriate.  NEURO: Oriented as arrived to appointment on time with no prompting.   Lab Results  Component Value Date   CREATININE 1.01 (H) 07/10/2021   BUN 10 07/10/2021   NA 143 07/10/2021   K 4.1 07/10/2021   CL 105 07/10/2021   CO2 25 07/10/2021   Lab Results  Component Value Date   ALT 10 07/10/2021   AST 16 07/10/2021   ALKPHOS 48 07/10/2021   BILITOT 0.3 07/10/2021   Lab Results  Component Value Date   HGBA1C 5.4 07/10/2021   HGBA1C 5.5 12/27/2020   HGBA1C 5.4 04/28/2019   HGBA1C 5.5 04/20/2016   Lab Results  Component Value Date   INSULIN 11.2 07/10/2021   INSULIN 9.8 12/27/2020    INSULIN 19.4 04/28/2019   Lab Results  Component Value Date   TSH 2.210 12/27/2020   Lab Results  Component Value Date   CHOL 170 04/28/2019   HDL 80 04/28/2019   LDLCALC 80 04/28/2019   TRIG 47 04/28/2019   CHOLHDL 2.3 05/08/2017   Lab Results  Component Value Date   WBC 5.3 10/11/2020   HGB 12.8 10/11/2020   HCT 40.0 10/11/2020   MCV 88.5 10/11/2020   PLT 334 10/11/2020   Lab Results  Component Value Date   IRON 82 04/28/2019   TIBC 315 04/28/2019   FERRITIN 153 (H) 04/28/2019   Lab Results  Component Value Date   VD25OH 46.8 07/10/2021   VD25OH  45.8 12/27/2020   VD25OH 22.2 (L) 04/28/2019    Attestation Statements:   Reviewed by clinician on day of visit: allergies, medications, problem list, medical history, surgical history, family history, social history, and previous encounter notes.

## 2021-12-27 ENCOUNTER — Telehealth (INDEPENDENT_AMBULATORY_CARE_PROVIDER_SITE_OTHER): Payer: 59 | Admitting: Family Medicine

## 2021-12-27 ENCOUNTER — Encounter (INDEPENDENT_AMBULATORY_CARE_PROVIDER_SITE_OTHER): Payer: Self-pay | Admitting: Family Medicine

## 2021-12-27 ENCOUNTER — Telehealth: Payer: Self-pay | Admitting: Podiatry

## 2021-12-27 DIAGNOSIS — R632 Polyphagia: Secondary | ICD-10-CM

## 2021-12-27 DIAGNOSIS — K219 Gastro-esophageal reflux disease without esophagitis: Secondary | ICD-10-CM | POA: Diagnosis not present

## 2021-12-27 DIAGNOSIS — F419 Anxiety disorder, unspecified: Secondary | ICD-10-CM

## 2021-12-27 DIAGNOSIS — E559 Vitamin D deficiency, unspecified: Secondary | ICD-10-CM | POA: Diagnosis not present

## 2021-12-27 DIAGNOSIS — Z6841 Body Mass Index (BMI) 40.0 and over, adult: Secondary | ICD-10-CM

## 2021-12-27 DIAGNOSIS — E669 Obesity, unspecified: Secondary | ICD-10-CM

## 2021-12-27 DIAGNOSIS — F32A Depression, unspecified: Secondary | ICD-10-CM

## 2021-12-27 MED ORDER — VITAMIN D (ERGOCALCIFEROL) 1.25 MG (50000 UNIT) PO CAPS: 50000.0000 [IU] | ORAL_CAPSULE | ORAL | 0 refills | Status: DC

## 2021-12-27 MED ORDER — OMEPRAZOLE 40 MG PO CPDR: 40.0000 mg | DELAYED_RELEASE_CAPSULE | Freq: Every day | ORAL | 0 refills | Status: DC

## 2021-12-27 MED ORDER — WEGOVY 0.5 MG/0.5ML ~~LOC~~ SOAJ: 0.5000 mg | SUBCUTANEOUS | 0 refills | Status: DC

## 2021-12-27 MED ORDER — ESCITALOPRAM OXALATE 20 MG PO TABS: 20.0000 mg | ORAL_TABLET | Freq: Every day | ORAL | 0 refills | Status: DC

## 2021-12-27 NOTE — Telephone Encounter (Signed)
Okay to renew.  She just needs to come into the front office for a new form to be filled out.  6 months.  Thanks, Dr. Amalia Hailey

## 2021-12-27 NOTE — Telephone Encounter (Signed)
Pt called & stated that her handicap placard is expired & would like it renewed. Please advise

## 2022-01-03 ENCOUNTER — Encounter: Payer: Self-pay | Admitting: Family Medicine

## 2022-01-03 ENCOUNTER — Ambulatory Visit (INDEPENDENT_AMBULATORY_CARE_PROVIDER_SITE_OTHER): Payer: BC Managed Care – PPO | Admitting: Family Medicine

## 2022-01-03 DIAGNOSIS — R062 Wheezing: Secondary | ICD-10-CM | POA: Diagnosis not present

## 2022-01-03 DIAGNOSIS — J069 Acute upper respiratory infection, unspecified: Secondary | ICD-10-CM | POA: Diagnosis not present

## 2022-01-03 DIAGNOSIS — J4540 Moderate persistent asthma, uncomplicated: Secondary | ICD-10-CM

## 2022-01-03 MED ORDER — DOXYCYCLINE HYCLATE 100 MG PO TABS: 100.0000 mg | ORAL_TABLET | Freq: Two times a day (BID) | ORAL | 0 refills | Status: AC

## 2022-01-03 MED ORDER — TRELEGY ELLIPTA 100-62.5-25 MCG/ACT IN AEPB: 1.0000 | INHALATION_SPRAY | Freq: Every day | RESPIRATORY_TRACT | 11 refills | Status: DC

## 2022-01-03 MED ORDER — MONTELUKAST SODIUM 10 MG PO TABS: 10.0000 mg | ORAL_TABLET | Freq: Every day | ORAL | 3 refills | Status: DC

## 2022-01-03 MED ORDER — ALBUTEROL SULFATE HFA 108 (90 BASE) MCG/ACT IN AERS: 2.0000 | INHALATION_SPRAY | Freq: Four times a day (QID) | RESPIRATORY_TRACT | 2 refills | Status: DC | PRN

## 2022-01-03 NOTE — Progress Notes (Signed)
Virtual Visit via Telephone Note Due to COVID-19 pandemic this visit was conducted virtually. This visit type was conducted due to national recommendations for restrictions regarding the COVID-19 Pandemic (e.g. social distancing, sheltering in place) in an effort to limit this patient's exposure and mitigate transmission in our community. All issues noted in this document were discussed and addressed.  A physical exam was not performed with this format.   I connected with Adriana Moon on 01/03/2022 at 1444 by telephone and verified that I am speaking with the correct person using two identifiers. Adriana Moon is currently located at home and patient is currently with them during visit. The provider, Monia Pouch, FNP is located in their office at time of visit.  I discussed the limitations, risks, security and privacy concerns of performing an evaluation and management service by virtual visit and the availability of in person appointments. I also discussed with the patient that there may be a patient responsible charge related to this service. The patient expressed understanding and agreed to proceed.  Subjective:  Patient ID: Adriana Moon, female    DOB: 1981/04/09, 41 y.o.   MRN: [id number]  Chief Complaint:  URI   HPI: Adriana Moon is a 41 y.o. female presenting on 01/03/2022 for URI   Pt reports she has been out of her Breo and Singulair for her asthma for a few months. States over the last 7-10 days she has had to use her Albuterol inhaler more than normal due to increased cough, congestion, rhinorrhea, and wheezing. She took a home COVID test which was negative. States she has been using her Flonase and Mucinex without relief of symptoms. States she has pressure in her forehead and under her eyes, worse with bending over. No shortness of breath or chest pain.   URI  This is a new problem. The current episode started 1 to 4  weeks ago. The problem has been waxing and waning. Associated symptoms include congestion, coughing, headaches, a plugged ear sensation, rhinorrhea, sinus pain, a sore throat and wheezing. Pertinent negatives include no abdominal pain, chest pain, diarrhea, dysuria, ear pain, joint pain, joint swelling, nausea, neck pain, rash, sneezing, swollen glands or vomiting. She has tried decongestant, inhaler use and acetaminophen for the symptoms. The treatment provided no relief.     Relevant past medical, surgical, family, and social history reviewed and updated as indicated.  Allergies and medications reviewed and updated.   Past Medical History:  Diagnosis Date   Angio-edema    Anxiety    manages naturally   Arthritis of both knees    Asthma    Bilateral swelling of feet    Breast cancer (HCC)    twice   Depression    pt. reports that she tends to get down & has had Lexapro in the past but its been taken off her list for now.    Family history of breast cancer    Family history of kidney cancer    Family history of lung cancer    Family history of prostate cancer    GERD (gastroesophageal reflux disease)    IBS (irritable bowel syndrome)    Insulin resistance    Joint pain    Kidney failure    stage 1   Lactose intolerance    Migraines    PCOS (polycystic ovarian syndrome)    PCOS (polycystic ovarian syndrome)    Urticaria    Varicose veins of legs     Past  Surgical History:  Procedure Laterality Date   BREAST LUMPECTOMY Right 2009   BREAST RECONSTRUCTION WITH PLACEMENT OF TISSUE EXPANDER AND FLEX HD (ACELLULAR HYDRATED DERMIS) Bilateral 12/22/2018   Procedure: BILATERAL BREAST RECONSTRUCTION WITH PLACEMENT OF TISSUE EXPANDER AND FLEX HD (ACELLULAR HYDRATED DERMIS);  Surgeon: Wallace Going, DO;  Location: [facility];  Service: Plastics;  Laterality: Bilateral;   BREAST REDUCTION SURGERY Bilateral 01/25/2015   Procedure: MAMMARY REDUCTION  (BREAST);  Surgeon: [name], MD;  Location: [facility];  Service: Plastics;  Laterality: Bilateral;   CESAREAN SECTION     x2  2002 & 2006   ECTOPIC PREGNANCY SURGERY  06/2018   R side , done at [place] Bilateral 12/22/2018   with tissue expanders   MASTECTOMY W/ SENTINEL NODE BIOPSY Bilateral 12/22/2018   Procedure: BILATERAL MASTECTOMIES WITH RIGHT SENTINEL LYMPH NODE BIOPSY;  Surgeon: Coralie Keens, MD;  Location: [facility];  Service: General;  Laterality: Bilateral;   PLANTAR FASCIA SURGERY Bilateral    REDUCTION MAMMAPLASTY Left    REMOVAL OF BILATERAL TISSUE EXPANDERS WITH PLACEMENT OF BILATERAL BREAST IMPLANTS Bilateral 04/06/2019   Procedure: REMOVAL OF BILATERAL TISSUE EXPANDERS WITH PLACEMENT OF BILATERAL BREAST IMPLANTS;  Surgeon: Wallace Going, DO;  Location: [facility];  Service: Plastics;  Laterality: Bilateral;   RT lumpectomy     TONSILLECTOMY AND ADENOIDECTOMY N/A 10/11/2020   Procedure: TONSILLECTOMY AND ADENOIDECTOMY;  Surgeon: Leta Baptist, MD;  Location: [facility];  Service: ENT;  Laterality: N/A;    Social History   Socioeconomic History   Marital status: Legally Separated    Spouse name: Not on file   Number of children: 2   Years of education: Not on file   Highest education level: Not on file  Occupational History   Occupation: [employer]  Tobacco Use   Smoking status: Never   Smokeless tobacco: Never  Vaping Use   Vaping Use: Never used  Substance and Sexual Activity   Alcohol use: Yes    Alcohol/week: 0.0 standard drinks of alcohol    Comment: occ. - socially   Drug use: No   Sexual activity: Yes    Birth control/protection: Surgical    Comment: ablation  Other Topics Concern   Not on file  Social History Narrative   Not on file   Social Determinants of Health   Financial Resource Strain: Not on file  Food Insecurity: Not on file  Transportation Needs: Not on file  Physical  Activity: Not on file  Stress: Not on file  Social Connections: Not on file  Intimate Partner Violence: Not on file    Outpatient Encounter Medications as of 01/03/2022  Medication Sig   albuterol (VENTOLIN HFA) 108 (90 Base) MCG/ACT inhaler Inhale 2 puffs into the lungs every 6 (six) hours as needed for wheezing or shortness of breath.   doxycycline (VIBRA-TABS) 100 MG tablet Take 1 tablet (100 mg total) by mouth 2 (two) times daily for 10 days. 1 po bid   Fluticasone-Umeclidin-Vilant (TRELEGY ELLIPTA) 100-62.5-25 MCG/ACT AEPB Inhale 1 puff into the lungs daily.   montelukast (SINGULAIR) 10 MG tablet Take 1 tablet (10 mg total) by mouth at bedtime.   celecoxib (CELEBREX) 100 MG capsule Take 1 capsule (100 mg total) by mouth 2 (two) times daily.   cetirizine (ZYRTEC) 10 MG tablet Take 1-2 tablets 1-2 times a day as needed (Patient taking differently: Take 10  mg by mouth every evening.)   cyclobenzaprine (FLEXERIL) 10 MG tablet Take 1 tablet by mouth three times daily as needed for muscle spasm   escitalopram (LEXAPRO) 20 MG tablet Take 1 tablet (20 mg total) by mouth daily.   fluticasone (FLONASE) 50 MCG/ACT nasal spray Place 2 sprays into both nostrils daily.   Lactic Ac-Citric Ac-Pot Bitart (PHEXXI) 1.8-1-0.4 % GEL Place 1 applicator in vagina before up to 1 hour before each act of  vaginal intercourse as needed   Multiple Vitamin (MULTIVITAMIN WITH MINERALS) TABS tablet Take 1 tablet by mouth daily.   omeprazole (PRILOSEC) 40 MG capsule Take 1 capsule (40 mg total) by mouth daily.   Semaglutide-Weight Management (WEGOVY) 0.5 MG/0.5ML SOAJ Inject 0.5 mg into the skin once a week.   Spacer/Aero Chamber Mouthpiece MISC 1 each by Does not apply route every 6 (six) hours as needed.   tamoxifen (NOLVADEX) 20 MG tablet Take 1 tablet (20 mg total) by mouth daily.   Vitamin D, Ergocalciferol, (DRISDOL) 1.25 MG (50000 UNIT) CAPS capsule Take 1 capsule (50,000 Units total) by mouth every 7 (seven)  days.   [DISCONTINUED] meloxicam (MOBIC) 15 MG tablet Take 1 tablet by mouth once daily   No facility-administered encounter medications on file as of 01/03/2022.    Allergies  Allergen Reactions   Nsaids Hives, Swelling and Anaphylaxis   Prednisone     Lost taste in her mouth, also makes "me crazy & mean"    Sheep Allergy Anaphylaxis   Dust Mite Extract Cough   Other Cough    pollen   Pollen Extract Cough   Percocet [Oxycodone-Acetaminophen] Nausea Only    Very lightheaded.   Shellfish Allergy Hives and Swelling    Review of Systems  Constitutional:  Positive for activity change. Negative for appetite change, chills, diaphoresis, fatigue, fever and unexpected weight change.  HENT:  Positive for congestion, postnasal drip, rhinorrhea, sinus pressure, sinus pain and sore throat. Negative for dental problem, drooling, ear discharge, ear pain, facial swelling, hearing loss, mouth sores, nosebleeds, sneezing, tinnitus, trouble swallowing and voice change.   Eyes:  Negative for photophobia and visual disturbance.  Respiratory:  Positive for cough and wheezing. Negative for apnea, choking, chest tightness, shortness of breath and stridor.   Cardiovascular:  Negative for chest pain, palpitations and leg swelling.  Gastrointestinal:  Negative for abdominal pain, diarrhea, nausea and vomiting.  Genitourinary:  Negative for decreased urine volume, difficulty urinating and dysuria.  Musculoskeletal:  Negative for joint pain and neck pain.  Skin:  Negative for rash.  Neurological:  Positive for headaches. Negative for dizziness, tremors, seizures, syncope, facial asymmetry, speech difficulty, weakness, light-headedness and numbness.  Psychiatric/Behavioral:  Negative for confusion.   All other systems reviewed and are negative.        Observations/Objective: No vital signs or physical exam, this was a virtual health encounter.  Pt alert and oriented, answers all questions appropriately,  and able to speak in full sentences.    Assessment and Plan: Janiyla was seen today for uri.  Diagnoses and all orders for this visit:  Moderate persistent asthma without complication Wheezing Refill rescue inhaler, restart Singulair and Trelegy as pt has become more symptomatic with more persistent symptoms. Pt aware to report new, worsening, or persistent symptoms.  -     Fluticasone-Umeclidin-Vilant (TRELEGY ELLIPTA) 100-62.5-25 MCG/ACT AEPB; Inhale 1 puff into the lungs daily. -     montelukast (SINGULAIR) 10 MG tablet; Take 1 tablet (10 mg total) by mouth  at bedtime. -     albuterol (VENTOLIN HFA) 108 (90 Base) MCG/ACT inhaler; Inhale 2 puffs into the lungs every 6 (six) hours as needed for wheezing or shortness of breath.  URI with cough and congestion Due to ongoing symptoms despite symptomatic care at home, will initiate antibiotic therapy. Continue symptomatic care. Report new, worsening, or persistent symptoms.  -     Fluticasone-Umeclidin-Vilant (TRELEGY ELLIPTA) 100-62.5-25 MCG/ACT AEPB; Inhale 1 puff into the lungs daily. -     albuterol (VENTOLIN HFA) 108 (90 Base) MCG/ACT inhaler; Inhale 2 puffs into the lungs every 6 (six) hours as needed for wheezing or shortness of breath. -     doxycycline (VIBRA-TABS) 100 MG tablet; Take 1 tablet (100 mg total) by mouth 2 (two) times daily for 10 days. 1 po bid     Follow Up Instructions: Return in about 2 weeks (around 01/17/2022), or if symptoms worsen or fail to improve.    I discussed the assessment and treatment plan with the patient. The patient was provided an opportunity to ask questions and all were answered. The patient agreed with the plan and demonstrated an understanding of the instructions.   The patient was advised to call back or seek an in-person evaluation if the symptoms worsen or if the condition fails to improve as anticipated.  The above assessment and management plan was discussed with the patient. The  patient verbalized understanding of and has agreed to the management plan. Patient is aware to call the clinic if they develop any new symptoms or if symptoms persist or worsen. Patient is aware when to return to the clinic for a follow-up visit. Patient educated on when it is appropriate to go to the emergency department.    I provided 20 minutes of time during this telephone encounter.   Monia Pouch, FNP-C [facility] [address] [place], [place] [address] [phone] 01/03/2022

## 2022-01-15 ENCOUNTER — Encounter (INDEPENDENT_AMBULATORY_CARE_PROVIDER_SITE_OTHER): Payer: Self-pay

## 2022-01-15 ENCOUNTER — Telehealth (INDEPENDENT_AMBULATORY_CARE_PROVIDER_SITE_OTHER): Payer: Self-pay | Admitting: Family Medicine

## 2022-01-15 NOTE — Telephone Encounter (Signed)
Dawn [employer] - Prior authorization denied for [employer]. Per insurance: plan exclusion/not a covered benefit. Patient sent denial message via mychart.

## 2022-01-16 ENCOUNTER — Telehealth: Payer: Self-pay | Admitting: Podiatry

## 2022-01-16 NOTE — Telephone Encounter (Signed)
Pt called stating she was waiting on a call about the handicap form.  Upon checking there was a message left for pt that he did approve it and pt needs to stop at the front desk and pick up the form.

## 2022-01-24 ENCOUNTER — Ambulatory Visit (INDEPENDENT_AMBULATORY_CARE_PROVIDER_SITE_OTHER): Payer: Self-pay | Admitting: Family Medicine

## 2022-02-11 NOTE — Progress Notes (Unsigned)
TeleHealth Visit:  This visit was completed with telemedicine (audio/video) technology. Adriana Moon has verbally consented to this TeleHealth visit. The patient is located at home, the provider is located at home. The participants in this visit include the listed provider and patient. The visit was conducted today via MyChart video.  OBESITY Adriana Moon is here to discuss her progress with her obesity treatment plan along with follow-up of her obesity related diagnoses.   Today's visit was # 18 Starting weight: 260 Starting date: 04/28/2019 Weight reported last virtual office visit: 276 lbs on 12/27/21 Today's reported weight: No weight reported.  Nutrition Plan: keeping a food journal and adhering to recommended goals of 1100-1200 calories and 75 gms protein daily  Current exercise:  walks 10000 steps daily at work  Interim History: Adriana Moon has not been journaling.  She says she is too busy to keep up with it.  Prescribed Wegovy last office visit but it was not covered by insurance.  She notes hunger has not been a big issue for her recently. She says she is doing better with her eating.  She struggles with getting enough protein but she is working on it.  She recently completed requirements for approval for bariatric surgery.  She had her psychology visit today and was cleared for surgery.  She is planning Roux-en-Y gastric bypass after the first of the year with Dr. Romana Juniper.  She plans to continue to follow-up with [name] after surgery as I have advised her previously.  Assessment/Plan:  1. GERD This is currently well controlled with omeprazole and over-the-counter famotidine as needed.  Likely will improve with weight loss after surgery.  Plan: Refill omeprazole 40 mg daily. May continue OTC famotidine per package directions as needed.  2. Vitamin D Deficiency Vitamin D is nearly at goal of 50.  Last vitamin D level was 46.8 on 07/10/2021. She is on weekly prescription  Vitamin D 50,000 IU.  Lab Results  Component Value Date   VD25OH 46.8 07/10/2021   VD25OH 45.8 12/27/2020   VD25OH 22.2 (L) 04/28/2019    Plan: Refill prescription vitamin D 50,000 IU weekly. Check vitamin D level next office visit.   3. Obesity: Current BMI 46.68 Adriana Moon is currently in the action stage of change. As such, her goal is to continue with weight loss efforts.  She has agreed to the Category 1 Plan or following a lower carbohydrate, vegetable and lean protein rich diet plan.   Discussed the low-carb plan and she wants to give this a try. Sent low-carb plan via MyChart message. Discussed that she will likely be put on a liver shrinking diet before surgery which is low-carb.  Also postop the focus will be on protein so she should start preparing to amp up her protein intake. Patient was advised that bariatric surgery is a tool for weight loss and will not be successful long-term without significant dietary changes and exercise.  Exercise goals: All adults should avoid inactivity. Some physical activity is better than none, and adults who participate in any amount of physical activity gain some health benefits.  Behavioral modification strategies: increasing lean protein intake, decreasing simple carbohydrates, increasing water intake, meal planning and cooking strategies, and planning for success.  Adriana Moon has agreed to follow-up with our clinic in 5 weeks with Dr. Gerarda Fraction with fasting labs.  No orders of the defined types were placed in this encounter.   Medications Discontinued During This Encounter  Medication Reason   Semaglutide-Weight Management (WEGOVY) 0.5 MG/0.5ML SOAJ Cost  of medication   Vitamin D, Ergocalciferol, (DRISDOL) 1.25 MG (50000 UNIT) CAPS capsule Reorder   omeprazole (PRILOSEC) 40 MG capsule Reorder     Meds ordered this encounter  Medications   omeprazole (PRILOSEC) 40 MG capsule    Sig: Take 1 capsule (40 mg total) by mouth daily.     Dispense:  90 capsule    Refill:  0    Order Specific Question:   Supervising Provider    Answer:   Loyal Gambler E [[id number]]   Vitamin D, Ergocalciferol, (DRISDOL) 1.25 MG (50000 UNIT) CAPS capsule    Sig: Take 1 capsule (50,000 Units total) by mouth every 7 (seven) days.    Dispense:  12 capsule    Refill:  0    Order Specific Question:   Supervising Provider    Answer:   Dell Ponto [[id number]]      Objective:   VITALS: Per patient if applicable, see vitals. GENERAL: Alert and in no acute distress. CARDIOPULMONARY: No increased WOB. Speaking in clear sentences.  PSYCH: Pleasant and cooperative. Speech normal rate and rhythm. Affect is appropriate. Insight and judgement are appropriate. Attention is focused, linear, and appropriate.  NEURO: Oriented as arrived to appointment on time with no prompting.   Lab Results  Component Value Date   CREATININE 1.01 (H) 07/10/2021   BUN 10 07/10/2021   NA 143 07/10/2021   K 4.1 07/10/2021   CL 105 07/10/2021   CO2 25 07/10/2021   Lab Results  Component Value Date   ALT 10 07/10/2021   AST 16 07/10/2021   ALKPHOS 48 07/10/2021   BILITOT 0.3 07/10/2021   Lab Results  Component Value Date   HGBA1C 5.4 07/10/2021   HGBA1C 5.5 12/27/2020   HGBA1C 5.4 04/28/2019   HGBA1C 5.5 04/20/2016   Lab Results  Component Value Date   INSULIN 11.2 07/10/2021   INSULIN 9.8 12/27/2020   INSULIN 19.4 04/28/2019   Lab Results  Component Value Date   TSH 2.210 12/27/2020   Lab Results  Component Value Date   CHOL 170 04/28/2019   HDL 80 04/28/2019   LDLCALC 80 04/28/2019   TRIG 47 04/28/2019   CHOLHDL 2.3 05/08/2017   Lab Results  Component Value Date   WBC 5.3 10/11/2020   HGB 12.8 10/11/2020   HCT 40.0 10/11/2020   MCV 88.5 10/11/2020   PLT 334 10/11/2020   Lab Results  Component Value Date   IRON 82 04/28/2019   TIBC 315 04/28/2019   FERRITIN 153 (H) 04/28/2019   Lab Results  Component Value Date   VD25OH 46.8 07/10/2021    VD25OH 45.8 12/27/2020   VD25OH 22.2 (L) 04/28/2019    Attestation Statements:   Reviewed by clinician on day of visit: allergies, medications, problem list, medical history, surgical history, family history, social history, and previous encounter notes.

## 2022-02-13 ENCOUNTER — Encounter (INDEPENDENT_AMBULATORY_CARE_PROVIDER_SITE_OTHER): Payer: Self-pay | Admitting: Family Medicine

## 2022-02-13 ENCOUNTER — Telehealth (INDEPENDENT_AMBULATORY_CARE_PROVIDER_SITE_OTHER): Payer: BC Managed Care – PPO | Admitting: Family Medicine

## 2022-02-13 DIAGNOSIS — K219 Gastro-esophageal reflux disease without esophagitis: Secondary | ICD-10-CM

## 2022-02-13 DIAGNOSIS — E559 Vitamin D deficiency, unspecified: Secondary | ICD-10-CM

## 2022-02-13 DIAGNOSIS — Z6841 Body Mass Index (BMI) 40.0 and over, adult: Secondary | ICD-10-CM | POA: Diagnosis not present

## 2022-02-13 DIAGNOSIS — E669 Obesity, unspecified: Secondary | ICD-10-CM | POA: Diagnosis not present

## 2022-02-13 MED ORDER — VITAMIN D (ERGOCALCIFEROL) 1.25 MG (50000 UNIT) PO CAPS: 50000.0000 [IU] | ORAL_CAPSULE | ORAL | 0 refills | Status: DC

## 2022-02-13 MED ORDER — OMEPRAZOLE 40 MG PO CPDR: 40.0000 mg | DELAYED_RELEASE_CAPSULE | Freq: Every day | ORAL | 0 refills | Status: DC

## 2022-02-16 ENCOUNTER — Telehealth: Payer: Self-pay | Admitting: *Deleted

## 2022-02-16 NOTE — Telephone Encounter (Signed)
Patient has received her handicap placard, approved by physician-02/16/22

## 2022-03-22 ENCOUNTER — Ambulatory Visit (INDEPENDENT_AMBULATORY_CARE_PROVIDER_SITE_OTHER): Payer: BC Managed Care – PPO | Admitting: Internal Medicine

## 2022-03-25 ENCOUNTER — Other Ambulatory Visit: Payer: Self-pay | Admitting: Podiatry

## 2022-04-09 NOTE — Progress Notes (Unsigned)
TeleHealth Visit:  This visit was completed with telemedicine (audio/video) technology. Adriana Moon has verbally consented to this TeleHealth visit. The patient is located at home, the provider is located at home. The participants in this visit include the listed provider and patient. The visit was conducted today via MyChart video.  OBESITY Adriana Moon is here to discuss her progress with her obesity treatment plan along with follow-up of her obesity related diagnoses.   Today's visit was # 38 Starting weight: 260 Starting date: 04/28/2019 Weight at last in office visit: 239 lbs on 08/15/21 Total weight loss: 21 lbs at last in office visit on 08/15/21. Today's reported weight: *** lbs No weight reported.  Nutrition Plan: the Category 1 Plan OR  following a lower carbohydrate, vegetable and lean protein rich diet plan.   Current exercise: {exercise types:16438} walks 10000 steps daily at work  Interim History:  ***  Assessment/Plan:  1. ***  2. ***  3. ***  Obesity: Current BMI *** Adriana Moon {CHL AMB IS/IS NOT:210130109} currently in the action stage of change. As such, her goal is to {MWMwtloss#1:210800005}.  She has agreed to {MWMwtlossportion/plan2:23431}.   Exercise goals: {MWM EXERCISE RECS:23473}  Behavioral modification strategies: {MWMwtlossdietstrategies3:23432}.  Adriana Moon has agreed to follow-up with our clinic in {NUMBER 1-10:22536} weeks.   No orders of the defined types were placed in this encounter.   There are no discontinued medications.   No orders of the defined types were placed in this encounter.     Objective:   VITALS: Per patient if applicable, see vitals. GENERAL: Alert and in no acute distress. CARDIOPULMONARY: No increased WOB. Speaking in clear sentences.  PSYCH: Pleasant and cooperative. Speech normal rate and rhythm. Affect is appropriate. Insight and judgement are appropriate. Attention is focused, linear, and appropriate.  NEURO:  Oriented as arrived to appointment on time with no prompting.   Lab Results  Component Value Date   CREATININE 1.01 (H) 07/10/2021   BUN 10 07/10/2021   NA 143 07/10/2021   K 4.1 07/10/2021   CL 105 07/10/2021   CO2 25 07/10/2021   Lab Results  Component Value Date   ALT 10 07/10/2021   AST 16 07/10/2021   ALKPHOS 48 07/10/2021   BILITOT 0.3 07/10/2021   Lab Results  Component Value Date   HGBA1C 5.4 07/10/2021   HGBA1C 5.5 12/27/2020   HGBA1C 5.4 04/28/2019   HGBA1C 5.5 04/20/2016   Lab Results  Component Value Date   INSULIN 11.2 07/10/2021   INSULIN 9.8 12/27/2020   INSULIN 19.4 04/28/2019   Lab Results  Component Value Date   TSH 2.210 12/27/2020   Lab Results  Component Value Date   CHOL 170 04/28/2019   HDL 80 04/28/2019   LDLCALC 80 04/28/2019   TRIG 47 04/28/2019   CHOLHDL 2.3 05/08/2017   Lab Results  Component Value Date   WBC 5.3 10/11/2020   HGB 12.8 10/11/2020   HCT 40.0 10/11/2020   MCV 88.5 10/11/2020   PLT 334 10/11/2020   Lab Results  Component Value Date   IRON 82 04/28/2019   TIBC 315 04/28/2019   FERRITIN 153 (H) 04/28/2019   Lab Results  Component Value Date   VD25OH 46.8 07/10/2021   VD25OH 45.8 12/27/2020   VD25OH 22.2 (L) 04/28/2019    Attestation Statements:   Reviewed by clinician on day of visit: allergies, medications, problem list, medical history, surgical history, family history, social history, and previous encounter notes.  ***(delete if time-based billing not used) Time  spent on visit including the items listed below was *** minutes.  -preparing to see the patient (e.g., review of tests, history, previous notes) -obtaining and/or reviewing separately obtained history -counseling and educating the patient/family/caregiver -documenting clinical information in the electronic or other health record -ordering medications, tests, or procedures -independently interpreting results and communicating results to the  patient/ family/caregiver -referring and communicating with other health care professionals  -care coordination

## 2022-04-10 ENCOUNTER — Encounter (INDEPENDENT_AMBULATORY_CARE_PROVIDER_SITE_OTHER): Payer: Self-pay | Admitting: Family Medicine

## 2022-04-10 ENCOUNTER — Telehealth (INDEPENDENT_AMBULATORY_CARE_PROVIDER_SITE_OTHER): Payer: BLUE CROSS/BLUE SHIELD | Admitting: Family Medicine

## 2022-04-10 DIAGNOSIS — E88819 Insulin resistance, unspecified: Secondary | ICD-10-CM

## 2022-04-10 DIAGNOSIS — E669 Obesity, unspecified: Secondary | ICD-10-CM

## 2022-04-10 DIAGNOSIS — Z6841 Body Mass Index (BMI) 40.0 and over, adult: Secondary | ICD-10-CM

## 2022-04-10 DIAGNOSIS — F32A Depression, unspecified: Secondary | ICD-10-CM | POA: Diagnosis not present

## 2022-04-10 DIAGNOSIS — F419 Anxiety disorder, unspecified: Secondary | ICD-10-CM | POA: Diagnosis not present

## 2022-04-10 DIAGNOSIS — E559 Vitamin D deficiency, unspecified: Secondary | ICD-10-CM

## 2022-04-10 MED ORDER — METFORMIN HCL 500 MG PO TABS: 500.0000 mg | ORAL_TABLET | Freq: Two times a day (BID) | ORAL | 0 refills | Status: DC

## 2022-04-10 MED ORDER — ZEPBOUND 2.5 MG/0.5ML ~~LOC~~ SOAJ: 2.5000 mg | SUBCUTANEOUS | 0 refills | Status: DC

## 2022-04-10 MED ORDER — VITAMIN D (ERGOCALCIFEROL) 1.25 MG (50000 UNIT) PO CAPS: 50000.0000 [IU] | ORAL_CAPSULE | ORAL | 0 refills | Status: DC

## 2022-04-10 MED ORDER — ESCITALOPRAM OXALATE 20 MG PO TABS: 20.0000 mg | ORAL_TABLET | Freq: Every day | ORAL | 0 refills | Status: DC

## 2022-04-11 ENCOUNTER — Telehealth (INDEPENDENT_AMBULATORY_CARE_PROVIDER_SITE_OTHER): Payer: Self-pay | Admitting: Family Medicine

## 2022-04-11 NOTE — Telephone Encounter (Signed)
PA submitted via CoverMyMeds. Patient will be notified once a determination is made.

## 2022-04-18 ENCOUNTER — Encounter (INDEPENDENT_AMBULATORY_CARE_PROVIDER_SITE_OTHER): Payer: Self-pay | Admitting: *Deleted

## 2022-04-18 NOTE — Telephone Encounter (Signed)
PA has been denied.   RE: Denial of initial request for coverage of Zepbound 2.'5MG'$ /0.5ML [place] [facility] Dear [name] [place]: You or your doctor recently requested prescription coverage for Zepbound 2.'5MG'$ /0.5ML [place] [facility]. After careful consideration and review of your prescription plan's drug list and the information sent to [name], this request was not approved. We understand that this decision may not be what you and your doctor expected. This letter and the enclosed information will explain your options and help you decide what to do next. Why your request was denied: *Drug Not Covered/Plan Exclusion - Your request for coverage was denied because your prescription benefit plan does not cover the requested medication. This decision relates specifically to coverage provided under your prescription benefit plan and does not involve any determination of medical judgment. We reviewed all the supporting information sent to [name] and used your plan's guidelines to make our decision. If you'd like [name] to send you a free copy of the guidelines (requirements, criteria, or protocols) we used, call the number on your prescription ID card. For more information about your prescription benefit, please refer to the prescription benefit section in your benefit plan materials. We've let your doctor know about this denial, so they will be ready to work with you on your next steps or an alternative treatment plan. [facility] Enclosures cc: Dr. Jake Bathe _______________________________________________________

## 2022-05-08 NOTE — Progress Notes (Deleted)
TeleHealth Visit:  This visit was completed with telemedicine (audio/video) technology. Adriana Moon has verbally consented to this TeleHealth visit. The patient is located at home, the provider is located at home. The participants in this visit include the listed provider and patient. The visit was conducted today via MyChart video.  OBESITY Adriana Moon is here to discuss her progress with her obesity treatment plan along with follow-up of her obesity related diagnoses.   Today's visit was # 60 Starting weight: 260 Starting date: 04/28/2019 Weight at last in office visit: No weight reported Today's reported weight: *** lbs No weight reported. Total weight loss: *** lbs   Nutrition Plan: lower carbohydrate, vegetable and lean protein rich diet plan.    Current exercise:  walks 7000-10000 steps daily at work  Interim History:  ***  Assessment/Plan:  1. ***  2. ***  3. ***  Obesity: Current BMI *** Adriana Moon {CHL AMB IS/IS NOT:210130109} currently in the action stage of change. As such, her goal is to {MWMwtloss#1:210800005}.  She has agreed to {MWMwtlossportion/plan2:23431}.   Exercise goals: {MWM EXERCISE RECS:23473}  Behavioral modification strategies: {MWMwtlossdietstrategies3:23432}.  Adriana Moon has agreed to follow-up with our clinic in {NUMBER 1-10:22536} weeks.   No orders of the defined types were placed in this encounter.   There are no discontinued medications.   No orders of the defined types were placed in this encounter.     Objective:   VITALS: Per patient if applicable, see vitals. GENERAL: Alert and in no acute distress. CARDIOPULMONARY: No increased WOB. Speaking in clear sentences.  PSYCH: Pleasant and cooperative. Speech normal rate and rhythm. Affect is appropriate. Insight and judgement are appropriate. Attention is focused, linear, and appropriate.  NEURO: Oriented as arrived to appointment on time with no prompting.   Lab Results  Component  Value Date   CREATININE 1.01 (H) 07/10/2021   BUN 10 07/10/2021   NA 143 07/10/2021   K 4.1 07/10/2021   CL 105 07/10/2021   CO2 25 07/10/2021   Lab Results  Component Value Date   ALT 10 07/10/2021   AST 16 07/10/2021   ALKPHOS 48 07/10/2021   BILITOT 0.3 07/10/2021   Lab Results  Component Value Date   HGBA1C 5.4 07/10/2021   HGBA1C 5.5 12/27/2020   HGBA1C 5.4 04/28/2019   HGBA1C 5.5 04/20/2016   Lab Results  Component Value Date   INSULIN 11.2 07/10/2021   INSULIN 9.8 12/27/2020   INSULIN 19.4 04/28/2019   Lab Results  Component Value Date   TSH 2.210 12/27/2020   Lab Results  Component Value Date   CHOL 170 04/28/2019   HDL 80 04/28/2019   LDLCALC 80 04/28/2019   TRIG 47 04/28/2019   CHOLHDL 2.3 05/08/2017   Lab Results  Component Value Date   WBC 5.3 10/11/2020   HGB 12.8 10/11/2020   HCT 40.0 10/11/2020   MCV 88.5 10/11/2020   PLT 334 10/11/2020   Lab Results  Component Value Date   IRON 82 04/28/2019   TIBC 315 04/28/2019   FERRITIN 153 (H) 04/28/2019   Lab Results  Component Value Date   VD25OH 46.8 07/10/2021   VD25OH 45.8 12/27/2020   VD25OH 22.2 (L) 04/28/2019    Attestation Statements:   Reviewed by clinician on day of visit: allergies, medications, problem list, medical history, surgical history, family history, social history, and previous encounter notes.  ***(delete if time-based billing not used) Time spent on visit including the items listed below was *** minutes.  -preparing to see  the patient (e.g., review of tests, history, previous notes) -obtaining and/or reviewing separately obtained history -counseling and educating the patient/family/caregiver -documenting clinical information in the electronic or other health record -ordering medications, tests, or procedures -independently interpreting results and communicating results to the patient/ family/caregiver -referring and communicating with other health care professionals   -care coordination

## 2022-05-09 ENCOUNTER — Telehealth (INDEPENDENT_AMBULATORY_CARE_PROVIDER_SITE_OTHER): Payer: Self-pay | Admitting: Family Medicine

## 2022-05-09 ENCOUNTER — Telehealth (INDEPENDENT_AMBULATORY_CARE_PROVIDER_SITE_OTHER): Payer: BLUE CROSS/BLUE SHIELD | Admitting: Family Medicine

## 2022-05-09 NOTE — Telephone Encounter (Signed)
Patient was a no-show for virtual visit. Unable to leave VM, mailbox was full.

## 2022-05-14 ENCOUNTER — Other Ambulatory Visit (INDEPENDENT_AMBULATORY_CARE_PROVIDER_SITE_OTHER): Payer: Self-pay | Admitting: Family Medicine

## 2022-05-14 DIAGNOSIS — E88819 Insulin resistance, unspecified: Secondary | ICD-10-CM

## 2022-05-16 ENCOUNTER — Ambulatory Visit: Payer: BLUE CROSS/BLUE SHIELD | Admitting: Adult Health

## 2022-05-17 ENCOUNTER — Ambulatory Visit (INDEPENDENT_AMBULATORY_CARE_PROVIDER_SITE_OTHER): Payer: BLUE CROSS/BLUE SHIELD | Admitting: Adult Health

## 2022-05-17 ENCOUNTER — Other Ambulatory Visit (HOSPITAL_COMMUNITY)
Admission: RE | Admit: 2022-05-17 | Discharge: 2022-05-17 | Disposition: A | Discharge status: Home / Self Care | Payer: BLUE CROSS/BLUE SHIELD | Source: Ambulatory Visit | Attending: Adult Health | Admitting: Adult Health

## 2022-05-17 ENCOUNTER — Encounter: Payer: Self-pay | Admitting: Adult Health

## 2022-05-17 VITALS — BP 128/88 | HR 68 | Ht 59.0 in | Wt 283.6 lb

## 2022-05-17 DIAGNOSIS — Z124 Encounter for screening for malignant neoplasm of cervix: Secondary | ICD-10-CM | POA: Insufficient documentation

## 2022-05-17 DIAGNOSIS — N898 Other specified noninflammatory disorders of vagina: Secondary | ICD-10-CM | POA: Diagnosis not present

## 2022-05-17 DIAGNOSIS — Z9229 Personal history of other drug therapy: Secondary | ICD-10-CM

## 2022-05-17 DIAGNOSIS — Z853 Personal history of malignant neoplasm of breast: Secondary | ICD-10-CM | POA: Diagnosis not present

## 2022-05-17 DIAGNOSIS — Z3202 Encounter for pregnancy test, result negative: Secondary | ICD-10-CM | POA: Diagnosis not present

## 2022-05-17 DIAGNOSIS — N926 Irregular menstruation, unspecified: Secondary | ICD-10-CM

## 2022-05-17 LAB — POCT URINE PREGNANCY: Preg Test, Ur: NEGATIVE

## 2022-05-17 NOTE — Progress Notes (Signed)
  Subjective:     Patient ID: Adriana Moon, female   DOB: Feb 12, 1981, 42 y.o.   MRN: [id number]  HPI [name] is a 42 year old black female, divorced, [id number], in complaining of missed period, had been having periods, sp ablation and has mild vaginal itching. She is on tamoxifen sp breast cancer. She needs a pap.  PCP is Darla Lesches NP  Review of Systems +missed period Mild vaginal itching  Reviewed past medical,surgical, social and family history. Reviewed medications and allergies.     Objective:   Physical Exam BP 128/88 (BP Location: Left Arm, Patient Position: Sitting, Cuff Size: Normal)   Pulse 68   Ht '4\' 11"'$  (1.499 m)   Wt 283 lb 9.6 oz (128.6 kg)   LMP 03/27/2022   BMI 57.28 kg/m  UPT is negative Skin warm and dry.Pelvic: external genitalia is normal in appearance no lesions, vagina: scant white discharge without odor,urethra has no lesions or masses noted, cervix:smooth and bulbous,Pap with HR HPV genotyping performed, uterus: normal size, shape and contour, non tender, no masses felt, adnexa: no masses or tenderness noted. Bladder is non tender and no masses felt. Fall risk is low  Upstream - 05/17/22 1105       Pregnancy Intention Screening   Does the patient want to become pregnant in the next year? No    Does the patient's partner want to become pregnant in the next year? No    Would the patient like to discuss contraceptive options today? No      Contraception Wrap Up   Current Method No Method - Other Reason   ablation   Reason for No Current Contraceptive Method at Intake ([facility] Only) Other    End Method No Method - Other Reason   ablation, wil use phexxi   Contraception Counseling Provided Yes            Examination chaperoned by Levy Pupa LPN     Assessment:     1. Routine Papanicolaou smear Pap sent Pap in 3 years if normal Physical in 1 year Will message when pap back   2. Pregnancy examination or test, negative result  3.  Missed period UPT is negative Not unusual to skip period occasionally, keep track of period Use Phexxi  4. Vaginal itching,mild Scant discharge If any yeast or BV comes back on pap will treat then   5. History of breast cancer  6. History of tamoxifen therapy On tamoxifen almost 4 years     Plan:     Physical in 1 year

## 2022-05-22 LAB — CYTOLOGY - PAP
Adequacy: ABSENT
Comment: NEGATIVE
Diagnosis: NEGATIVE
High risk HPV: NEGATIVE

## 2022-06-04 ENCOUNTER — Other Ambulatory Visit (INDEPENDENT_AMBULATORY_CARE_PROVIDER_SITE_OTHER): Payer: Self-pay | Admitting: Family Medicine

## 2022-06-04 DIAGNOSIS — E88819 Insulin resistance, unspecified: Secondary | ICD-10-CM

## 2022-06-18 ENCOUNTER — Ambulatory Visit: Payer: BLUE CROSS/BLUE SHIELD

## 2022-06-19 ENCOUNTER — Other Ambulatory Visit (INDEPENDENT_AMBULATORY_CARE_PROVIDER_SITE_OTHER): Payer: Self-pay | Admitting: Family Medicine

## 2022-06-19 ENCOUNTER — Encounter: Payer: Self-pay | Admitting: Dietician

## 2022-06-19 ENCOUNTER — Encounter: Payer: BLUE CROSS/BLUE SHIELD | Attending: Surgery | Admitting: Dietician

## 2022-06-19 ENCOUNTER — Ambulatory Visit: Payer: Self-pay | Admitting: Surgery

## 2022-06-19 VITALS — Ht 59.0 in | Wt 289.8 lb

## 2022-06-19 DIAGNOSIS — E669 Obesity, unspecified: Secondary | ICD-10-CM | POA: Diagnosis present

## 2022-06-19 DIAGNOSIS — E88819 Insulin resistance, unspecified: Secondary | ICD-10-CM

## 2022-06-19 NOTE — H&P (View-Only) (Signed)
Adriana Moon [id number]   Referring Provider:  Self   Subjective   Chief Complaint: Pre-operative Evaluation (Return weight loss  )     History of Present Illness: She reports no major changes in her health since her last visit.  She did well with Mounjaro, but it subsequently became unavailable and then she changed jobs (now works for [employer]) and her insurance would no longer cover the medication.  Upper GI in May of last year negative for hiatal hernia or reflux, notes mild esophageal dysmotility.  Of note she did have a small hiatal hernia documented on her endoscopy in 2021.  07/2021: She returns today for follow-up regarding surgical management of morbid obesity.  At our last visit she had initiated the pathway towards Roux-en-Y gastric bypass and had nearly completed this, but decided to pause the process in order to give medical therapy a try.  She did have some good results with Mounjaro but wants to be more aggressive, and is hoping to avoid lifelong medication therapy.  She denies any other significant medical problems since her last visit and remains interested in Roux-en-Y gastric bypass.  Last visit August 2022: This is a very pleasant 42-year-old woman who presents for consultation regarding surgical management of severe obesity.  She has struggled with this for essentially her entire life, and recalls that about fourth grade was when she began to really gain weight.  She states that she has been on a diet essentially for her entire life.  This does run in her family, specifically on her father's side. She has tried numerous methods of weight loss including diet plans, phentermine (multiple courses), Victoza, follow-up with [facility] for over a year, weight watchers, another diet center in 2020, low calorie diets, and the ketogenic diet.  With each of these attempts she has lost between 45 and 68 pounds, but ultimately regained that plus more.   She has also been recommended for Qsymia but this was not affordable.  Her highest adult weight is 298 pounds.  Today she is at 277.4lb. She struggles with joint pain and arthritis in both knees, and actually had to have surgery for plantar fasciitis last year.  This does limit her activity level.  She does endorse a history of reflux, she takes omeprazole daily and does have some breakthrough symptoms.  She had an upper endoscopy in November 2021 for dysphagia with Dr. Armbruster which notes a 1 cm hiatal hernia but no other esophageal abnormality.  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  Biopsies were negative for H. pylori, but did note changes consistent with reflux esophagitis  History of breast cancer status post lumpectomy with radiation in 2009, followed by breast reduction in 2016, and subsequently recurrent with bilateral mastectomy with reconstruction in 2020, she is on tamoxifen.  Her oncologist is Dr. Gudena.  History of vitamin D deficiency being treated with 50,000 units weekly  History of insulin resistance and PCOS, on metformin as well as Victoza  Previous abdominal surgery includes C-section x2, ectopic pregnancy with salpingectomy in 2020.  In addition to the above breast surgery she is also had Planter fasciitis surgery 2021, tonsillectomy this year in June, and endometrial ablation.   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  She has 2 sons age 15 and 19    Review of Systems: A complete review of systems was obtained from the patient.    I have reviewed this information and discussed as appropriate with the patient.  See HPI as well for other ROS.   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 Very lightheaded.     Current Outpatient Medications on File Prior to Visit  Medication Sig Dispense Refill   celecoxib (CELEBREX) 100 MG capsule Take 100 mg by mouth 2 (two) times daily     cyclobenzaprine (FLEXERIL) 10 MG tablet Take 1 tablet by mouth 3 (three) times daily as needed     escitalopram oxalate (LEXAPRO) 10 MG tablet Lexapro 10 mg tablet     tamoxifen (NOLVADEX) 20 MG tablet Take 20 mg by mouth once daily     meloxicam (MOBIC) 15 MG tablet Take 1 tablet by mouth once daily (Patient not taking: Reported on 06/19/2022)     metFORMIN (GLUCOPHAGE) 500 MG tablet Take 500 mg by mouth 2 (two) times daily     No current facility-administered medications on file prior to visit.    Family History  Problem Relation Age of Onset   Diabetes Father      Social History   Tobacco Use  Smoking Status Never  Smokeless Tobacco Never     Social History   Socioeconomic History   Marital status: Divorced  Tobacco Use   Smoking status: Never   Smokeless tobacco: Never  Vaping Use   Vaping Use: Never used  Substance and Sexual Activity   Alcohol use: Defer   Drug use: Defer   Sexual activity: Defer    Objective:    Vitals:   06/19/22 0951  BP: 118/78  Pulse: 88  Temp: 36.6 C (97.8 F)  SpO2: 95%  Weight: (!) 130.5 kg (287 lb 9.6 oz)  Height: 149.9 cm (4' 11")     Body mass index is 58.09 kg/m.  Alert, well-appearing Unlabored respirations    Assessment and Plan:  Diagnoses and all orders for this visit:  Morbid obesity (CMS-HCC)    Morbid obesity refractory to  lifestyle measures as well as medical therapy.  Complicated by PCOS, stage I chronic kidney disease, joint pain/arthritis, insulin resistance, GERD, depression/anxiety, and history of breast cancer.   She remains a candidate for bariatric surgery and given her reflux with esophagitis noted on endoscopy last year I continue to recommended Roux-en-Y gastric bypass. We have previously discussed the surgery including technical aspects, the risks of bleeding, infection, pain, scarring, injury to intra-abdominal structures, staple line/anastomotic leak or abscess, chronic abdominal pain or nausea, ulcer formation, internal hernia/bowel obstruction, malnutrition/vitamin deficiency, DVT/PE, pneumonia, heart attack, stroke, death, failure to reach weight loss goals and weight regain, hernia.  Discussed the typical peri-, and postoperative course.  Discussed the importance of lifelong behavioral changes to combat the chronic and relapsing disease which is obesity.  Questions welcomed and answered to   her satisfaction.  Will plan to schedule for Roux-en-Y gastric bypass.   Adriana Moon AMANDA Jance Siek, MD  

## 2022-06-19 NOTE — H&P (Signed)
Adriana Moon [id number]   Referring Provider:  Self   Subjective   Chief Complaint: Pre-operative Evaluation (Return weight loss  )     History of Present Illness: She reports no major changes in her health since her last visit.  She did well with Mounjaro, but it subsequently became unavailable and then she changed jobs (now works for [employer]) and her insurance would no longer cover the medication.  Upper GI in May of last year negative for hiatal hernia or reflux, notes mild esophageal dysmotility.  Of note she did have a small hiatal hernia documented on her endoscopy in 2021.  07/2021: She returns today for follow-up regarding surgical management of morbid obesity.  At our last visit she had initiated the pathway towards Roux-en-Y gastric bypass and had nearly completed this, but decided to pause the process in order to give medical therapy a try.  She did have some good results with Mounjaro but wants to be more aggressive, and is hoping to avoid lifelong medication therapy.  She denies any other significant medical problems since her last visit and remains interested in Roux-en-Y gastric bypass.  Last visit August 2022: This is a very pleasant 42 year old woman who presents for consultation regarding surgical management of severe obesity.  She has struggled with this for essentially her entire life, and recalls that about fourth grade was when she began to really gain weight.  She states that she has been on a diet essentially for her entire life.  This does run in her family, specifically on her father's side. She has tried numerous methods of weight loss including diet plans, phentermine (multiple courses), Victoza, follow-up with [facility] for over a year, weight watchers, another diet center in 2020, low calorie diets, and the ketogenic diet.  With each of these attempts she has lost between 45 and 68 pounds, but ultimately regained that plus more.   She has also been recommended for Qsymia but this was not affordable.  Her highest adult weight is 298 pounds.  Today she is at 277.4lb. She struggles with joint pain and arthritis in both knees, and actually had to have surgery for plantar fasciitis last year.  This does limit her activity level.  She does endorse a history of reflux, she takes omeprazole daily and does have some breakthrough symptoms.  She had an upper endoscopy in November 2021 for dysphagia with Dr. Havery Moros which notes a 1 cm hiatal hernia but no other esophageal abnormality.  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  Biopsies were negative for H. pylori, but did note changes consistent with reflux esophagitis  History of breast cancer status post lumpectomy with radiation in 2009, followed by breast reduction in 2016, and subsequently recurrent with bilateral mastectomy with reconstruction in 2020, she is on tamoxifen.  Her oncologist is Dr. Lindi Adie.  History of vitamin D deficiency being treated with 50,000 units weekly  History of insulin resistance and PCOS, on metformin as well as Victoza  Previous abdominal surgery includes C-section x2, ectopic pregnancy with salpingectomy in 2020.  In addition to the above breast surgery she is also had Planter fasciitis surgery 2021, tonsillectomy this year in June, and endometrial ablation.   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  She has 2 sons age 63 and 66    Review of Systems: A complete review of systems was obtained from the patient.  I have reviewed this information and discussed as appropriate with the patient.  See HPI as well for other ROS.   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 Very lightheaded.     Current Outpatient Medications on File Prior to Visit  Medication Sig Dispense Refill   celecoxib (CELEBREX) 100 MG capsule Take 100 mg by mouth 2 (two) times daily     cyclobenzaprine (FLEXERIL) 10 MG tablet Take 1 tablet by mouth 3 (three) times daily as needed     escitalopram oxalate (LEXAPRO) 10 MG tablet Lexapro 10 mg tablet     tamoxifen (NOLVADEX) 20 MG tablet Take 20 mg by mouth once daily     meloxicam (MOBIC) 15 MG tablet Take 1 tablet by mouth once daily (Patient not taking: Reported on 06/19/2022)     metFORMIN (GLUCOPHAGE) 500 MG tablet Take 500 mg by mouth 2 (two) times daily     No current facility-administered medications on file prior to visit.    Family History  Problem Relation Age of Onset   Diabetes Father      Social History   Tobacco Use  Smoking Status Never  Smokeless Tobacco Never     Social History   Socioeconomic History   Marital status: Divorced  Tobacco Use   Smoking status: Never   Smokeless tobacco: Never  Vaping Use   Vaping Use: Never used  Substance and Sexual Activity   Alcohol use: Defer   Drug use: Defer   Sexual activity: Defer    Objective:    Vitals:   06/19/22 0951  BP: 118/78  Pulse: 88  Temp: 36.6 C (97.8 F)  SpO2: 95%  Weight: (!) 130.5 kg (287 lb 9.6 oz)  Height: 149.9 cm ('4\' 11"'$ )     Body mass index is 58.09 kg/m.  Alert, well-appearing Unlabored respirations    Assessment and Plan:  Diagnoses and all orders for this visit:  Morbid obesity (CMS-HCC)    Morbid obesity refractory to  lifestyle measures as well as medical therapy.  Complicated by PCOS, stage I chronic kidney disease, joint pain/arthritis, insulin resistance, GERD, depression/anxiety, and history of breast cancer.   She remains a candidate for bariatric surgery and given her reflux with esophagitis noted on endoscopy last year I continue to recommended Roux-en-Y gastric bypass. We have previously discussed the surgery including technical aspects, the risks of bleeding, infection, pain, scarring, injury to intra-abdominal structures, staple line/anastomotic leak or abscess, chronic abdominal pain or nausea, ulcer formation, internal hernia/bowel obstruction, malnutrition/vitamin deficiency, DVT/PE, pneumonia, heart attack, stroke, death, failure to reach weight loss goals and weight regain, hernia.  Discussed the typical peri-, and postoperative course.  Discussed the importance of lifelong behavioral changes to combat the chronic and relapsing disease which is obesity.  Questions welcomed and answered to  her satisfaction.  Will plan to schedule for Roux-en-Y gastric bypass.   Candis Kabel Raquel James, MD

## 2022-06-19 NOTE — Progress Notes (Signed)
Nutrition Assessment for Bariatric Surgery: Pre-Surgery Behavioral and Nutrition Intervention Program   Medical Nutrition Therapy  Appt Start Time: 10:44    End Time: 11:51  Patient was seen on 06/19/2022 for Pre-Operative Nutrition Assessment. Purpose of todays visit  enhance perioperative outcomes along with a healthy weight maintenance   Referral stated Supervised Weight Loss (SWL) visits needed: 0  Pt completed visits.   Pt has cleared nutrition requirements.   Planned surgery: RYGB Pt expectation of surgery: to lose about 140 lbs.   NUTRITION ASSESSMENT   Anthropometrics  Start weight at [facility]: 289.8 lbs (date: 06/19/2022)  Height: 59 in BMI: 58.53 kg/m2     Clinical   Pharmacotherapy: History of weight loss medication used: phentermine, Mounjaro, semeglutide    Medical hx: asthma, cancer, obesity Medications: tamoxifen, escitaloprim, metformin, Vit D Labs: see EMR Notable signs/symptoms: none noted Any previous deficiencies? 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  Pt states she has issues with her feet and knees, stating she does not get any physical activity except for work (ADLs).  Pt states she is seeing doctor Evans for her feet and knees, stating she is hoping getting weight off, she will have some relief to her feet and knees. Pt states she works at night.  Current Physical Activity Recommendations state 150 minutes per week of moderate to vigorous movement including Cardio and 1-2 days of resistance activities as well as flexibility/balance activities:  Pts current physical activity: ADLs, with 0% recommendation reached   Sleep Hygiene: duration and quality: good, 7-9 hours a day.  Pt  states sometimes she will sleep all day when she does not have to work, stating she will have a sleep day.  Pt states she works night shifts.  Current Patient Perceived Stress Level as stated by pt on a scale of 1-10:  5       Stress Management Techniques: fishing, read books  According to the Dietary Guidelines for Americans Recommendation: equivalent 1.5-2 cups fruits per day, equivalent 2-3 cups vegetables per day and at least half all grains whole  Fruit servings per day (on average): 0-1, meeting 10% recommendation  Non-starchy vegetable servings per day (on average): 2, meeting 75% recommendation  Whole Grains per day (on average): 0-1  Number of meals missed/skipped per week out of 21: 14  24-Hr Dietary Recall First Meal: up at 3-4 pm, egg rolls or 6 inch italian sub, or tyson chicken patty on a bun, or sandwich Snack: donuts or honey bun, doritos  Second Meal: sandwich Snack: donuts or honey bun, doritos Third Meal: skip or cereal Snack:  Beverages: diet soda, water, coffee, water with flavorings  Alcoholic beverages per week: 3 drinks, 4 times a year   Estimated Energy Needs Calories: 1500   NUTRITION DIAGNOSIS  Overweight/obesity ([place]-3.3) related to past poor dietary habits and physical inactivity as evidenced by patient w/ planned RYGB surgery following dietary guidelines for continued weight loss.    NUTRITION INTERVENTION  Nutrition counseling (C-1) and education (E-2) to facilitate bariatric surgery goals.  Educated pt on micronutrient deficiencies post-surgery and behavioral/dietary strategies to start in order to mitigate that risk   Behavioral and Dietary Interventions Pre-Op Goals Reviewed with the Patient Nutrition: Healthy Eating Behaviors Switch to non-caloric, non-carbonated and  non-caffeinated beverages such as  water, unsweetened tea, Crystal Light and zero calorie beverages (aim for 64 oz. per day) Cut out grazing between meals or at night  Find a  protein shake you like Eat every 3-5 hours        Eliminate distractions while eating (TV, computer, reading, driving, texting) Take 20-30 minutes to eat a meal  Decrease high sugar foods/decrease high fat/fried foods Eliminate alcoholic beverages Increase protein intake (eggs, fish, chicken, yogurt) before surgery Eat non starchy vegetables 2 times a day 7 days a week Eat complex carbohydrates such as whole grains and fruits   Behavioral Modification: Physical Activity Increase my usual daily activity (use stairs, park farther, etc.) Engage in _______________________  activity  _______ minutes ______ times per week  Other:    _________________________________________________________________     Problem Solving I will think about my usual eating patterns and how to tweak them How can my friends and family support me Barriers to starting my changes Learn and understand appetite verses hunger   Healthy Coping Allow for ___________ activities per week to help me manage stress Reframe negative thoughts I will keep a picture of someone or something that is my inspiration & look at it daily   Monitoring  Weigh myself once a week  Measure my progress by monitoring how my clothes fit Keep a food record of what I eat and drink for the next ________ (time period) Take pictures of what I eat and drink for the next ________ (time period) Use an app to count steps/day for the next_______ (time period) Measure my progress such as increased energy and more restful sleep Monitor your acid reflux and bowel habits, are they getting better?    Handouts Provided Include  Bariatric Surgery handouts (Nutrition Visits, Pre Surgery Behavioral Change Goals, Protein Shakes Brands to Choose From, Vitamins & Mineral Supplementation)  Learning Style & Readiness for Change Teaching method utilized: Visual, Auditory, and hands on  Demonstrated degree of understanding via: Teach Back  Readiness Level:  preparation Barriers to learning/adherence to lifestyle change: work schedule  RD's Notes for Next Visit     MONITORING & EVALUATION Dietary intake, weekly physical activity, body weight, and preoperative behavioral change goals   Next Steps  Pt has completed visits. No further supervised visits required/recomended  Patient is to follow up at [facility] for pre-op class, >2 week prior to scheduled surgery.

## 2022-07-02 ENCOUNTER — Encounter: Payer: BLUE CROSS/BLUE SHIELD | Attending: Surgery | Admitting: Skilled Nursing Facility1

## 2022-07-02 ENCOUNTER — Inpatient Hospital Stay: Payer: BLUE CROSS/BLUE SHIELD | Admitting: Adult Health

## 2022-07-02 ENCOUNTER — Telehealth: Payer: Self-pay | Admitting: Family Medicine

## 2022-07-02 ENCOUNTER — Telehealth: Payer: Self-pay | Admitting: Adult Health

## 2022-07-02 VITALS — Wt 296.0 lb

## 2022-07-02 DIAGNOSIS — Z6841 Body Mass Index (BMI) 40.0 and over, adult: Secondary | ICD-10-CM | POA: Insufficient documentation

## 2022-07-02 DIAGNOSIS — E88819 Insulin resistance, unspecified: Secondary | ICD-10-CM

## 2022-07-02 DIAGNOSIS — N189 Chronic kidney disease, unspecified: Secondary | ICD-10-CM | POA: Diagnosis not present

## 2022-07-02 DIAGNOSIS — Z713 Dietary counseling and surveillance: Secondary | ICD-10-CM | POA: Diagnosis not present

## 2022-07-02 DIAGNOSIS — F419 Anxiety disorder, unspecified: Secondary | ICD-10-CM

## 2022-07-02 DIAGNOSIS — E669 Obesity, unspecified: Secondary | ICD-10-CM | POA: Diagnosis present

## 2022-07-02 NOTE — Progress Notes (Signed)
Pre-Operative Nutrition Class:    Patient was seen on 07/02/2022 for Pre-Operative Bariatric Surgery Education [facility]    Surgery date:  Surgery type: RYGB Start weight at [facility]: 289.8 Weight today: 296  Samples given per MNT protocol. Patient educated on appropriate usage: Ensure protein shake: June/04/2022; [id number] Procare multivitamin: 09/26; 9932 Procare calcium: 02/04/[id number]   The following the learning objectives were met by the patient during this course: Identify Pre-Op Dietary Goals and will begin 2 weeks pre-operatively Identify appropriate sources of fluids and proteins  State protein recommendations and appropriate sources pre and post-operatively Identify Post-Operative Dietary Goals and will follow for 2 weeks post-operatively Identify appropriate multivitamin and calcium sources Describe the need for physical activity post-operatively and will follow MD recommendations State when to call healthcare provider regarding medication questions or post-operative complications When having a diagnosis of diabetes understanding hypoglycemia symptoms and the inclusion of 1 complex carbohydrate per meal  Handouts given during class include: Pre-Op Bariatric Surgery Diet Handout Protein Shake Handout Post-Op Bariatric Surgery Nutrition Handout BELT Program Information Flyer Support Group Information Flyer [facility] Bariatric Supplements Price List  Follow-Up Plan: Patient will follow-up at [facility] 2 weeks post operatively for diet advancement per MD.

## 2022-07-02 NOTE — Telephone Encounter (Signed)
Patient stated you used to prescribe her lexapro and metformin before she started going to the weight loss clinic and they took the prescriptions over. She is getting ready to have weight loss surgery and will not be going to those doctors anymore and would like for you to take over them again. She now has to have her prescriptions sent to [facility] for a 90 day supply due to the change in jobs.

## 2022-07-02 NOTE — Telephone Encounter (Signed)
Patient called to reschedule her appointment today with [facility].

## 2022-07-03 MED ORDER — ESCITALOPRAM OXALATE 20 MG PO TABS: 20.0000 mg | ORAL_TABLET | Freq: Every day | ORAL | 3 refills | Status: DC

## 2022-07-03 MED ORDER — METFORMIN HCL 500 MG PO TABS: 500.0000 mg | ORAL_TABLET | Freq: Two times a day (BID) | ORAL | 1 refills | Status: DC

## 2022-07-03 NOTE — Telephone Encounter (Signed)
Pt having weight loss surgery 07/16/22, she is aware that rx has been sent for lexapro and metformin

## 2022-07-03 NOTE — Addendum Note (Signed)
Addended by: Derrek Monaco A on: 07/03/2022 01:41 PM   Modules accepted: Orders

## 2022-07-09 ENCOUNTER — Other Ambulatory Visit: Payer: Self-pay | Admitting: Adult Health

## 2022-07-11 ENCOUNTER — Other Ambulatory Visit (INDEPENDENT_AMBULATORY_CARE_PROVIDER_SITE_OTHER): Payer: BLUE CROSS/BLUE SHIELD | Admitting: *Deleted

## 2022-07-11 ENCOUNTER — Other Ambulatory Visit (HOSPITAL_COMMUNITY)
Admission: RE | Admit: 2022-07-11 | Discharge: 2022-07-11 | Disposition: A | Discharge status: Home / Self Care | Payer: BLUE CROSS/BLUE SHIELD | Source: Ambulatory Visit | Attending: Obstetrics & Gynecology | Admitting: Obstetrics & Gynecology

## 2022-07-11 DIAGNOSIS — N898 Other specified noninflammatory disorders of vagina: Secondary | ICD-10-CM | POA: Insufficient documentation

## 2022-07-11 NOTE — Progress Notes (Signed)
   NURSE VISIT- VAGINITIS/STD/POC  SUBJECTIVE:  Adriana Moon is a 42 y.o. [id number] GYN patientfemale here for a vaginal swab for vaginitis screening.  She reports the following symptoms: burning and vulvar itching for 3days. Denies abnormal vaginal bleeding, significant pelvic pain, fever, or UTI symptoms.  OBJECTIVE:  There were no vitals taken for this visit.  Appears well, in no apparent distress  ASSESSMENT: Vaginal swab for vaginitis screening  PLAN: Self-collected vaginal probe for Gonorrhea, Chlamydia, Trichomonas, Bacterial Vaginosis, Yeast sent to lab Treatment: to be determined once results are received Follow-up as needed if symptoms persist/worsen, or new symptoms develop  Janece Canterbury  07/11/2022 8:40 AM

## 2022-07-11 NOTE — Patient Instructions (Signed)
SURGICAL WAITING ROOM VISITATION Patients having surgery or a procedure may have no more than 2 support people in the waiting area - these visitors may rotate in the visitor waiting room.   Due to an increase in RSV and influenza rates and associated hospitalizations, children ages 37 and under may not visit patients in [place]. If the patient needs to stay at the hospital during part of their recovery, the visitor guidelines for inpatient rooms apply.  PRE-OP VISITATION  Pre-op nurse will coordinate an appropriate time for 1 support person to accompany the patient in pre-op.  This support person may not rotate.  This visitor will be contacted when the time is appropriate for the visitor to come back in the pre-op area.  Please refer to the [facility] website for the visitor guidelines for Inpatients (after your surgery is over and you are in a regular room).  You are not required to quarantine at this time prior to your surgery. However, you must do this: Hand Hygiene often Do NOT share personal items Notify your provider if you are in close contact with someone who has COVID or you develop fever 100.4 or greater, new onset of sneezing, cough, sore throat, shortness of breath or body aches.  If you test positive for Covid or have been in contact with anyone that has tested positive in the last 10 days please notify you surgeon.    Your procedure is scheduled on:  Monday  July 16, 2022  Report to [facility] Main Entrance: [place] entrance where the [employer] is available.   Report to admitting at: 08:45 AM  +++++Call this number if you have any questions or problems the morning of surgery [phone]  Do not eat food after Midnight the night prior to your surgery/procedure.  After Midnight you may have the following liquids until  08:00 AM DAY OF SURGERY  Clear Liquid Diet Water Black Coffee (sugar ok, NO MILK/CREAM OR CREAMERS)  Tea (sugar ok, NO  MILK/CREAM OR CREAMERS) regular and decaf                             Plain Jell-O  with no fruit (NO RED)                                           Fruit ices (not with fruit pulp, NO RED)                                     Popsicles (NO RED)                                                                  Juice: apple, WHITE grape, WHITE cranberry Sports drinks like Gatorade or Powerade (NO RED)                     The day of surgery:  Drink ONE (1) Pre-Surgery G2 at  08:00 AM the morning of surgery. Drink in one sitting. Do not sip.  This drink was given to you during your hospital pre-op appointment visit. Nothing else to drink after completing the Pre-SurgeryG2 : No candy, chewing gum or throat lozenges.    FOLLOW BOWEL PREP AND ANY ADDITIONAL PRE OP INSTRUCTIONS YOU RECEIVED FROM YOUR SURGEON'S OFFICE!!!   Oral Hygiene is also important to reduce your risk of infection.        Remember - BRUSH YOUR TEETH THE MORNING OF SURGERY WITH YOUR REGULAR TOOTHPASTE  Do NOT smoke after Midnight the night before surgery.  Take ONLY these medicines the morning of surgery with A SIP OF WATER: Omeprazole, famotidine, escitalopram (Lexapro). You may use your Trelegy Ellipta, Albuterol inhaler and Flonase nasal spray if needed.   If You have been diagnosed with Sleep Apnea - Bring CPAP mask and tubing day of surgery. We will provide you with a CPAP machine on the day of your surgery.                   You may not have any metal on your body including hair pins, jewelry, and body piercing  Do not wear make-up, lotions, powders, perfumes or deodorant  Do not wear nail polish including gel and S&S, artificial / acrylic nails, or any other type of covering on natural nails including finger and toenails. If you have artificial nails, gel coating, etc., that needs to be removed by a nail salon, Please have this removed prior to surgery. Not doing so may mean that your surgery could be cancelled or  delayed if the Surgeon or anesthesia staff feels like they are unable to monitor you safely.   Do not shave 48 hours prior to surgery to avoid nicks in your skin which may contribute to postoperative infections.   Contacts, Hearing Aids, dentures or bridgework may not be worn into surgery. DENTURES WILL BE REMOVED PRIOR TO SURGERY PLEASE DO NOT APPLY "Poly grip" OR ADHESIVES!!!  You may bring a small overnight bag with you on the day of surgery, only pack items that are not valuable. [facility] IS NOT RESPONSIBLE   FOR VALUABLES THAT ARE LOST OR STOLEN.   Do not bring your home medications to the hospital. The Pharmacy will dispense medications listed on your medication list to you during your admission in the Hospital.  Special Instructions: Bring a copy of your healthcare power of attorney and living will documents the day of surgery, if you wish to have them scanned into your [facility] Medical Records- EPIC  Please read over the following fact sheets you were given: IF YOU HAVE QUESTIONS ABOUT YOUR [place], [place] [phone].   [place] - Preparing for Surgery Before surgery, you can play an important role.  Because skin is not sterile, your skin needs to be as free of germs as possible.  You can reduce the number of germs on your skin by washing with CHG (chlorahexidine gluconate) soap before surgery.  CHG is an antiseptic cleaner which kills germs and bonds with the skin to continue killing germs even after washing. Please DO NOT use if you have an allergy to CHG or antibacterial soaps.  If your skin becomes reddened/irritated stop using the CHG and inform your nurse when you arrive at Short Stay. Do not shave (including legs and underarms) for at least 48 hours prior to the first CHG shower.  You may shave your face/neck.  Please follow these instructions carefully:  1.  Shower with CHG Soap the night before surgery and the  morning of surgery.  2.  If you choose  to wash your hair, wash your hair first as usual with your normal  shampoo.  3.  After you shampoo, rinse your hair and body thoroughly to remove the shampoo.                             4.  Use CHG as you would any other liquid soap.  You can apply chg directly to the skin and wash.  Gently with a scrungie or clean washcloth.  5.  Apply the CHG Soap to your body ONLY FROM THE NECK DOWN.   Do not use on face/ open                           Wound or open sores. Avoid contact with eyes, ears mouth and genitals (private parts).                       Wash face,  Genitals (private parts) with your normal soap.             6.  Wash thoroughly, paying special attention to the area where your  surgery  will be performed.  7.  Thoroughly rinse your body with warm water from the neck down.  8.  DO NOT shower/wash with your normal soap after using and rinsing off the CHG Soap.            9.  Pat yourself dry with a clean towel.            10.  Wear clean pajamas.            11.  Place clean sheets on your bed the night of your first shower and do not  sleep with pets.  ON THE DAY OF SURGERY : Do not apply any lotions/deodorants the morning of surgery.  Please wear clean clothes to the hospital/surgery center.    FAILURE TO FOLLOW THESE INSTRUCTIONS MAY RESULT IN THE CANCELLATION OF YOUR SURGERY  PATIENT SIGNATURE_________________________________  NURSE SIGNATURE__________________________________  ________________________________________________________________________      Adriana Moon    An incentive spirometer is a tool that can help keep your lungs clear and active. This tool measures how well you are filling your lungs with each breath. Taking long deep breaths may help reverse or decrease the chance of developing breathing (pulmonary) problems (especially infection) following: A long period of time when you are unable to move or be active. BEFORE THE PROCEDURE  If the spirometer  includes an indicator to show your best effort, your nurse or respiratory therapist will set it to a desired goal. If possible, sit up straight or lean slightly forward. Try not to slouch. Hold the incentive spirometer in an upright position. INSTRUCTIONS FOR USE  Sit on the edge of your bed if possible, or sit up as far as you can in bed or on a chair. Hold the incentive spirometer in an upright position. Breathe out normally. Place the mouthpiece in your mouth and seal your lips tightly around it. Breathe in slowly and as deeply as possible, raising the piston or the ball toward the top of the column. Hold your breath for 3-5 seconds or for as long as possible. Allow the piston or ball to fall to the bottom of the column. Remove the mouthpiece from your mouth and breathe out normally. Rest  for a few seconds and repeat Steps 1 through 7 at least 10 times every 1-2 hours when you are awake. Take your time and take a few normal breaths between deep breaths. The spirometer may include an indicator to show your best effort. Use the indicator as a goal to work toward during each repetition. After each set of 10 deep breaths, practice coughing to be sure your lungs are clear. If you have an incision (the cut made at the time of surgery), support your incision when coughing by placing a pillow or rolled up towels firmly against it. Once you are able to get out of bed, walk around indoors and cough well. You may stop using the incentive spirometer when instructed by your caregiver.  RISKS AND COMPLICATIONS Take your time so you do not get dizzy or light-headed. If you are in pain, you may need to take or ask for pain medication before doing incentive spirometry. It is harder to take a deep breath if you are having pain. AFTER USE Rest and breathe slowly and easily. It can be helpful to keep track of a log of your progress. Your caregiver can provide you with a simple table to help with this. If you are  using the spirometer at home, follow these instructions: [place] IF:  You are having difficultly using the spirometer. You have trouble using the spirometer as often as instructed. Your pain medication is not giving enough relief while using the spirometer. You develop fever of 100.5 F (38.1 C) or higher.                                                                                                    SEEK IMMEDIATE MEDICAL CARE IF:  You cough up bloody sputum that had not been present before. You develop fever of 102 F (38.9 C) or greater. You develop worsening pain at or near the incision site. MAKE SURE YOU:  Understand these instructions. Will watch your condition. Will get help right away if you are not doing well or get worse. Document Released: 08/20/2006 Document Revised: 07/02/2011 Document Reviewed: 10/21/2006 [facility] Patient Information 2014 [place], [place].     WHAT IS A BLOOD TRANSFUSION? Blood Transfusion Information  A transfusion is the replacement of blood or some of its parts. Blood is made up of multiple cells which provide different functions. Red blood cells carry oxygen and are used for blood loss replacement. White blood cells fight against infection. Platelets control bleeding. Plasma helps clot blood. Other blood products are available for specialized needs, such as hemophilia or other clotting disorders. BEFORE THE TRANSFUSION  Who gives blood for transfusions?  Healthy volunteers who are fully evaluated to make sure their blood is safe. This is blood bank blood. Transfusion therapy is the safest it has ever been in the practice of medicine. Before blood is taken from a donor, a complete history is taken to make sure that person has no history of diseases nor engages in risky social behavior (examples are intravenous drug use or sexual activity with multiple partners). The donor's  travel history is screened to minimize risk of transmitting  infections, such as malaria. The donated blood is tested for signs of infectious diseases, such as HIV and hepatitis. The blood is then tested to be sure it is compatible with you in order to minimize the chance of a transfusion reaction. If you or a relative donates blood, this is often done in anticipation of surgery and is not appropriate for emergency situations. It takes many days to process the donated blood. RISKS AND COMPLICATIONS Although transfusion therapy is very safe and saves many lives, the main dangers of transfusion include:  Getting an infectious disease. Developing a transfusion reaction. This is an allergic reaction to something in the blood you were given. Every precaution is taken to prevent this. The decision to have a blood transfusion has been considered carefully by your caregiver before blood is given. Blood is not given unless the benefits outweigh the risks. AFTER THE TRANSFUSION Right after receiving a blood transfusion, you will usually feel much better and more energetic. This is especially true if your red blood cells have gotten low (anemic). The transfusion raises the level of the red blood cells which carry oxygen, and this usually causes an energy increase. The nurse administering the transfusion will monitor you carefully for complications. HOME CARE INSTRUCTIONS  No special instructions are needed after a transfusion. You may find your energy is better. Speak with your caregiver about any limitations on activity for underlying diseases you may have. SEEK MEDICAL CARE IF:  Your condition is not improving after your transfusion. You develop redness or irritation at the intravenous (IV) site. SEEK IMMEDIATE MEDICAL CARE IF:  Any of the following symptoms occur over the next 12 hours: Shaking chills. You have a temperature by mouth above 102 F (38.9 C), not controlled by medicine. Chest, back, or muscle pain. People around you feel you are not acting correctly  or are confused. Shortness of breath or difficulty breathing. Dizziness and fainting. You get a rash or develop hives. You have a decrease in urine output. Your urine turns a dark color or changes to pink, red, or brown. Any of the following symptoms occur over the next 10 days: You have a temperature by mouth above 102 F (38.9 C), not controlled by medicine. Shortness of breath. Weakness after normal activity. The white part of the eye turns yellow (jaundice). You have a decrease in the amount of urine or are urinating less often. Your urine turns a dark color or changes to pink, red, or brown. Document Released: 04/06/2000 Document Revised: 07/02/2011 Document Reviewed: 11/24/2007 [facility] Patient Information 2014 [place], [place].  _______________________________________________________________________

## 2022-07-11 NOTE — Progress Notes (Signed)
COVID Vaccine received:  []  No [x]  Yes Date of any COVID positive Test in last 90 days:  None  PCP - Darla Lesches, FNP at [facility].  Cardiologist - None  Chest x-ray - 07-10-2021 2v  Epic EKG -  09-12-2021  Epic Stress Test -  ECHO -  Cardiac Cath -   PCR screen: []  Ordered & Completed                      []   No Order but Needs PROFEND                      [x]   N/A for this surgery  Surgery Plan:  []  Ambulatory                            []  Outpatient in bed                            [x]  Admit  Anesthesia:    [x]  General  []  Spinal                           []   Choice []   MAC  Bowel Prep - []  No  [x]   Yes __bariatric preop diet____  Pacemaker / ICD device [x]  No []  Yes   Spinal Cord Stimulator:[x]  No []  Yes       History of Sleep Apnea? [x]  No []  Yes   CPAP used?- [x]  No []  Yes    Does the patient monitor blood sugar? []  No []  Yes  [x]  N/A    insulin resistance and PCOS only, Takes Metformin  Blood Thinner / Instructions: None Aspirin Instructions:  None  ERAS Protocol Ordered: []  No  [x]  Yes PRE-SURGERY []  ENSURE  [x]  G2  Patient is to be NPO after: 08:00  Comments: Patient has right arm restricted d/t mastectomy. She is allergic to shellfish.  Activity level: Patient is able to climb a flight of stairs without difficulty; [x]  No CP  but would have SOB d/t obesity.  Patient can perform ADLs without assistance.   Anesthesia review: asthma, migraines, CKD2, GERD, anxiety, Insulin resistance, PCOS (takes Metformin for this).  Patient denies shortness of breath, fever, cough and chest pain at PAT appointment.  Patient verbalized understanding and agreement to the Pre-Surgical Instructions that were given to them at this PAT appointment. Patient was also educated of the need to review these PAT instructions again prior to her surgery.I reviewed the appropriate phone numbers to call if they have any and questions or concerns.

## 2022-07-12 ENCOUNTER — Encounter (HOSPITAL_COMMUNITY)
Admission: RE | Admit: 2022-07-12 | Discharge: 2022-07-12 | Disposition: A | Discharge status: Home / Self Care | Payer: BLUE CROSS/BLUE SHIELD | Source: Ambulatory Visit | Attending: Surgery | Admitting: Surgery

## 2022-07-12 ENCOUNTER — Other Ambulatory Visit: Payer: Self-pay

## 2022-07-12 ENCOUNTER — Inpatient Hospital Stay: Payer: BLUE CROSS/BLUE SHIELD | Attending: Hematology and Oncology | Admitting: Adult Health

## 2022-07-12 ENCOUNTER — Encounter (HOSPITAL_COMMUNITY): Payer: Self-pay

## 2022-07-12 ENCOUNTER — Telehealth: Payer: Self-pay

## 2022-07-12 DIAGNOSIS — Z01812 Encounter for preprocedural laboratory examination: Secondary | ICD-10-CM | POA: Insufficient documentation

## 2022-07-12 LAB — COMPREHENSIVE METABOLIC PANEL
ALT: 14 U/L (ref 0–44)
AST: 18 U/L (ref 15–41)
Albumin: 3.8 g/dL (ref 3.5–5.0)
Alkaline Phosphatase: 34 U/L — ABNORMAL LOW (ref 38–126)
Anion gap: 7 (ref 5–15)
BUN: 12 mg/dL (ref 6–20)
CO2: 23 mmol/L (ref 22–32)
Calcium: 8.5 mg/dL — ABNORMAL LOW (ref 8.9–10.3)
Chloride: 107 mmol/L (ref 98–111)
Creatinine, Ser: 1.04 mg/dL — ABNORMAL HIGH (ref 0.44–1.00)
GFR, Estimated: >60 mL/min (ref >60)
Glucose, Bld: 110 mg/dL — ABNORMAL HIGH (ref 70–99)
Potassium: 3.5 mmol/L (ref 3.5–5.1)
Sodium: 137 mmol/L (ref 135–145)
Total Bilirubin: 0.7 mg/dL (ref 0.3–1.2)
Total Protein: 7.3 g/dL (ref 6.5–8.1)

## 2022-07-12 LAB — CBC WITH DIFFERENTIAL/PLATELET
Abs Immature Granulocytes: 0.01 10*3/uL (ref 0.00–0.07)
Basophils Absolute: 0 10*3/uL (ref 0.0–0.1)
Basophils Relative: 0 %
Eosinophils Absolute: 0.1 10*3/uL (ref 0.0–0.5)
Eosinophils Relative: 2 %
HCT: 36.7 % (ref 36.0–46.0)
Hemoglobin: 12.1 g/dL (ref 12.0–15.0)
Immature Granulocytes: 0 %
Lymphocytes Relative: 46 %
Lymphs Abs: 2.5 10*3/uL (ref 0.7–4.0)
MCH: 28 pg (ref 26.0–34.0)
MCHC: 33 g/dL (ref 30.0–36.0)
MCV: 85 fL (ref 80.0–100.0)
Monocytes Absolute: 0.6 10*3/uL (ref 0.1–1.0)
Monocytes Relative: 11 %
Neutro Abs: 2.2 10*3/uL (ref 1.7–7.7)
Neutrophils Relative %: 41 %
Platelets: 325 10*3/uL (ref 150–400)
RBC: 4.32 MIL/uL (ref 3.87–5.11)
RDW: 13.3 % (ref 11.5–15.5)
WBC: 5.4 10*3/uL (ref 4.0–10.5)
nRBC: 0 % (ref 0.0–0.2)

## 2022-07-12 LAB — CERVICOVAGINAL ANCILLARY ONLY
Bacterial Vaginitis (gardnerella): POSITIVE — AB
Candida Glabrata: NEGATIVE
Candida Vaginitis: POSITIVE — AB
Chlamydia: NEGATIVE
Comment: NEGATIVE
Comment: NEGATIVE
Comment: NEGATIVE
Comment: NEGATIVE
Comment: NEGATIVE
Comment: NORMAL
Neisseria Gonorrhea: NEGATIVE
Trichomonas: NEGATIVE

## 2022-07-12 MED ORDER — METRONIDAZOLE 500 MG PO TABS: 500.0000 mg | ORAL_TABLET | Freq: Two times a day (BID) | ORAL | 0 refills | Status: DC

## 2022-07-12 MED ORDER — FLUCONAZOLE 150 MG PO TABS: ORAL_TABLET | ORAL | 1 refills | Status: DC

## 2022-07-12 NOTE — Telephone Encounter (Signed)
+  BV and yeast on vaginal swab, rx sent in for flagyl and diflucan

## 2022-07-12 NOTE — Telephone Encounter (Signed)
Patient would like for someone to call her she has some questions to ask.

## 2022-07-12 NOTE — Telephone Encounter (Signed)
Returned patient's call. States vaginal itching it "really bad". Informed swab results were back and does need treatment for BV and yeast. Will send message to provider to send in medication. Advised to check back with her pharmacy later this evening.

## 2022-07-13 NOTE — Discharge Instructions (Signed)

## 2022-07-16 ENCOUNTER — Other Ambulatory Visit: Payer: Self-pay

## 2022-07-16 ENCOUNTER — Encounter (HOSPITAL_COMMUNITY): Payer: Self-pay | Admitting: Surgery

## 2022-07-16 ENCOUNTER — Inpatient Hospital Stay (HOSPITAL_COMMUNITY): Payer: BLUE CROSS/BLUE SHIELD | Admitting: Certified Registered"

## 2022-07-16 ENCOUNTER — Encounter (HOSPITAL_COMMUNITY)
Admission: RE | Disposition: A | Discharge status: Home / Self Care | Payer: Self-pay | Source: Home / Self Care | Attending: Surgery

## 2022-07-16 ENCOUNTER — Inpatient Hospital Stay (HOSPITAL_COMMUNITY)
Admission: RE | Admit: 2022-07-16 | Discharge: 2022-07-17 | DRG: 621 | Disposition: A | Discharge status: Home / Self Care | Payer: BLUE CROSS/BLUE SHIELD | Attending: Surgery | Admitting: Surgery

## 2022-07-16 DIAGNOSIS — C50919 Malignant neoplasm of unspecified site of unspecified female breast: Secondary | ICD-10-CM | POA: Diagnosis present

## 2022-07-16 DIAGNOSIS — Z9013 Acquired absence of bilateral breasts and nipples: Secondary | ICD-10-CM

## 2022-07-16 DIAGNOSIS — F32A Depression, unspecified: Secondary | ICD-10-CM | POA: Diagnosis present

## 2022-07-16 DIAGNOSIS — M170 Bilateral primary osteoarthritis of knee: Secondary | ICD-10-CM | POA: Diagnosis present

## 2022-07-16 DIAGNOSIS — E559 Vitamin D deficiency, unspecified: Secondary | ICD-10-CM | POA: Diagnosis present

## 2022-07-16 DIAGNOSIS — Z79810 Long term (current) use of selective estrogen receptor modulators (SERMs): Secondary | ICD-10-CM | POA: Diagnosis not present

## 2022-07-16 DIAGNOSIS — K2100 Gastro-esophageal reflux disease with esophagitis, without bleeding: Secondary | ICD-10-CM | POA: Diagnosis present

## 2022-07-16 DIAGNOSIS — Z923 Personal history of irradiation: Secondary | ICD-10-CM | POA: Diagnosis not present

## 2022-07-16 DIAGNOSIS — F419 Anxiety disorder, unspecified: Secondary | ICD-10-CM | POA: Diagnosis present

## 2022-07-16 DIAGNOSIS — K449 Diaphragmatic hernia without obstruction or gangrene: Secondary | ICD-10-CM | POA: Diagnosis present

## 2022-07-16 DIAGNOSIS — Z91013 Allergy to seafood: Secondary | ICD-10-CM

## 2022-07-16 DIAGNOSIS — Z885 Allergy status to narcotic agent status: Secondary | ICD-10-CM

## 2022-07-16 DIAGNOSIS — J45909 Unspecified asthma, uncomplicated: Secondary | ICD-10-CM | POA: Diagnosis present

## 2022-07-16 DIAGNOSIS — Z79899 Other long term (current) drug therapy: Secondary | ICD-10-CM

## 2022-07-16 DIAGNOSIS — E282 Polycystic ovarian syndrome: Secondary | ICD-10-CM | POA: Diagnosis present

## 2022-07-16 DIAGNOSIS — Z833 Family history of diabetes mellitus: Secondary | ICD-10-CM

## 2022-07-16 DIAGNOSIS — Z888 Allergy status to other drugs, medicaments and biological substances status: Secondary | ICD-10-CM | POA: Diagnosis not present

## 2022-07-16 DIAGNOSIS — N181 Chronic kidney disease, stage 1: Secondary | ICD-10-CM | POA: Diagnosis present

## 2022-07-16 DIAGNOSIS — Z98891 History of uterine scar from previous surgery: Secondary | ICD-10-CM | POA: Diagnosis not present

## 2022-07-16 DIAGNOSIS — Z7984 Long term (current) use of oral hypoglycemic drugs: Secondary | ICD-10-CM | POA: Diagnosis not present

## 2022-07-16 DIAGNOSIS — Z6841 Body Mass Index (BMI) 40.0 and over, adult: Secondary | ICD-10-CM | POA: Diagnosis not present

## 2022-07-16 DIAGNOSIS — E88819 Insulin resistance, unspecified: Secondary | ICD-10-CM | POA: Diagnosis present

## 2022-07-16 HISTORY — PX: GASTRIC ROUX-EN-Y: SHX5262

## 2022-07-16 LAB — TYPE AND SCREEN
ABO/RH(D): A POS
Antibody Screen: NEGATIVE

## 2022-07-16 LAB — ABO/RH: ABO/RH(D): A POS

## 2022-07-16 LAB — POCT PREGNANCY, URINE: Preg Test, Ur: NEGATIVE

## 2022-07-16 SURGERY — LAPAROSCOPIC ROUX-EN-Y GASTRIC BYPASS WITH UPPER ENDOSCOPY
Anesthesia: General

## 2022-07-16 MED ORDER — ONDANSETRON HCL 4 MG/2ML IJ SOLN
INTRAMUSCULAR | Status: DC | PRN
  Administered 2022-07-16: 4 mg via INTRAVENOUS

## 2022-07-16 MED ORDER — LACTATED RINGERS IR SOLN
Status: DC | PRN
  Administered 2022-07-16: 1000 mL

## 2022-07-16 MED ORDER — ONDANSETRON HCL 4 MG/2ML IJ SOLN
INTRAMUSCULAR | Status: AC
  Filled 2022-07-16: qty 2

## 2022-07-16 MED ORDER — BUPIVACAINE-EPINEPHRINE (PF) 0.25% -1:200000 IJ SOLN
INTRAMUSCULAR | Status: DC | PRN
  Administered 2022-07-16: 50 mL

## 2022-07-16 MED ORDER — BUPIVACAINE-EPINEPHRINE (PF) 0.25% -1:200000 IJ SOLN
INTRAMUSCULAR | Status: AC
  Filled 2022-07-16: qty 30

## 2022-07-16 MED ORDER — ROCURONIUM BROMIDE 10 MG/ML (PF) SYRINGE
PREFILLED_SYRINGE | INTRAVENOUS | Status: AC
  Filled 2022-07-16: qty 10

## 2022-07-16 MED ORDER — SUGAMMADEX SODIUM 200 MG/2ML IV SOLN
INTRAVENOUS | Status: DC | PRN
  Administered 2022-07-16: 270 mg via INTRAVENOUS

## 2022-07-16 MED ORDER — HYDROMORPHONE HCL 1 MG/ML IJ SOLN
0.2500 mg | INTRAMUSCULAR | Status: DC | PRN
  Administered 2022-07-16 (×2): 0.5 mg via INTRAVENOUS

## 2022-07-16 MED ORDER — CHLORHEXIDINE GLUCONATE 4 % EX LIQD: 60.0000 mL | Freq: Once | CUTANEOUS | Status: DC

## 2022-07-16 MED ORDER — SIMETHICONE 80 MG PO CHEW
80.0000 mg | CHEWABLE_TABLET | Freq: Four times a day (QID) | ORAL | Status: DC | PRN
  Administered 2022-07-16: 80 mg via ORAL
  Filled 2022-07-16: qty 1

## 2022-07-16 MED ORDER — GABAPENTIN 100 MG PO CAPS
200.0000 mg | ORAL_CAPSULE | Freq: Two times a day (BID) | ORAL | Status: DC
  Administered 2022-07-16 – 2022-07-17 (×2): 200 mg via ORAL
  Filled 2022-07-16 (×2): qty 2

## 2022-07-16 MED ORDER — METOPROLOL TARTRATE 5 MG/5ML IV SOLN: 5.0000 mg | Freq: Four times a day (QID) | INTRAVENOUS | Status: DC | PRN

## 2022-07-16 MED ORDER — FIBRIN SEALANT 2 ML SINGLE DOSE KIT
2.0000 mL | PACK | Freq: Once | CUTANEOUS | Status: AC
  Administered 2022-07-16: 2 mL via TOPICAL
  Filled 2022-07-16: qty 2

## 2022-07-16 MED ORDER — FENTANYL CITRATE (PF) 100 MCG/2ML IJ SOLN
INTRAMUSCULAR | Status: AC
  Filled 2022-07-16: qty 2

## 2022-07-16 MED ORDER — LIDOCAINE 2% (20 MG/ML) 5 ML SYRINGE
INTRAMUSCULAR | Status: DC | PRN
  Administered 2022-07-16: 100 mg via INTRAVENOUS

## 2022-07-16 MED ORDER — ROCURONIUM BROMIDE 10 MG/ML (PF) SYRINGE
PREFILLED_SYRINGE | INTRAVENOUS | Status: DC | PRN
  Administered 2022-07-16: 60 mg via INTRAVENOUS
  Administered 2022-07-16 (×2): 20 mg via INTRAVENOUS

## 2022-07-16 MED ORDER — HYDROMORPHONE HCL 1 MG/ML IJ SOLN: 0.5000 mg | INTRAMUSCULAR | Status: DC | PRN

## 2022-07-16 MED ORDER — SCOPOLAMINE 1 MG/3DAYS TD PT72: 1.0000 | MEDICATED_PATCH | TRANSDERMAL | Status: DC

## 2022-07-16 MED ORDER — STERILE WATER FOR IRRIGATION IR SOLN
Status: DC | PRN
  Administered 2022-07-16: 1000 mL
  Administered 2022-07-16: 500 mL

## 2022-07-16 MED ORDER — MIDAZOLAM HCL 2 MG/2ML IJ SOLN
INTRAMUSCULAR | Status: AC
  Filled 2022-07-16: qty 2

## 2022-07-16 MED ORDER — ONDANSETRON HCL 4 MG/2ML IJ SOLN: 4.0000 mg | Freq: Once | INTRAMUSCULAR | Status: DC | PRN

## 2022-07-16 MED ORDER — OXYCODONE HCL 5 MG/5ML PO SOLN
5.0000 mg | Freq: Four times a day (QID) | ORAL | Status: DC | PRN
  Administered 2022-07-16: 5 mg via ORAL
  Filled 2022-07-16: qty 5

## 2022-07-16 MED ORDER — SODIUM CHLORIDE 0.9 % IV SOLN
2.0000 g | INTRAVENOUS | Status: AC
  Administered 2022-07-16: 2 g via INTRAVENOUS
  Filled 2022-07-16: qty 2

## 2022-07-16 MED ORDER — APREPITANT 40 MG PO CAPS
40.0000 mg | ORAL_CAPSULE | ORAL | Status: AC
  Administered 2022-07-16: 40 mg via ORAL
  Filled 2022-07-16: qty 1

## 2022-07-16 MED ORDER — ORAL CARE MOUTH RINSE: 15.0000 mL | Freq: Once | OROMUCOSAL | Status: AC

## 2022-07-16 MED ORDER — SCOPOLAMINE 1 MG/3DAYS TD PT72
1.0000 | MEDICATED_PATCH | TRANSDERMAL | Status: DC
  Administered 2022-07-16: 1.5 mg via TRANSDERMAL
  Filled 2022-07-16: qty 1

## 2022-07-16 MED ORDER — TAMOXIFEN CITRATE 10 MG PO TABS
20.0000 mg | ORAL_TABLET | Freq: Every day | ORAL | Status: DC
  Administered 2022-07-17: 20 mg via ORAL
  Filled 2022-07-16 (×2): qty 2

## 2022-07-16 MED ORDER — ALBUTEROL SULFATE (2.5 MG/3ML) 0.083% IN NEBU: 2.5000 mg | INHALATION_SOLUTION | Freq: Four times a day (QID) | RESPIRATORY_TRACT | Status: DC | PRN

## 2022-07-16 MED ORDER — HYDRALAZINE HCL 20 MG/ML IJ SOLN: 10.0000 mg | INTRAMUSCULAR | Status: DC | PRN

## 2022-07-16 MED ORDER — KETAMINE HCL 10 MG/ML IJ SOLN
INTRAMUSCULAR | Status: DC | PRN
  Administered 2022-07-16 (×2): 15 mg via INTRAVENOUS

## 2022-07-16 MED ORDER — DOCUSATE SODIUM 100 MG PO CAPS
100.0000 mg | ORAL_CAPSULE | Freq: Two times a day (BID) | ORAL | Status: DC
  Administered 2022-07-16 – 2022-07-17 (×2): 100 mg via ORAL
  Filled 2022-07-16 (×3): qty 1

## 2022-07-16 MED ORDER — ONDANSETRON HCL 4 MG/2ML IJ SOLN
4.0000 mg | INTRAMUSCULAR | Status: DC | PRN
  Administered 2022-07-16: 4 mg via INTRAVENOUS
  Filled 2022-07-16: qty 2

## 2022-07-16 MED ORDER — HEPARIN SODIUM (PORCINE) 5000 UNIT/ML IJ SOLN
5000.0000 [IU] | Freq: Three times a day (TID) | INTRAMUSCULAR | Status: DC
  Administered 2022-07-16 – 2022-07-17 (×2): 5000 [IU] via SUBCUTANEOUS
  Filled 2022-07-16 (×2): qty 1

## 2022-07-16 MED ORDER — METOCLOPRAMIDE HCL 5 MG/ML IJ SOLN
10.0000 mg | Freq: Four times a day (QID) | INTRAMUSCULAR | Status: DC
  Administered 2022-07-16 – 2022-07-17 (×3): 10 mg via INTRAVENOUS
  Filled 2022-07-16 (×3): qty 2

## 2022-07-16 MED ORDER — TRAMADOL HCL 50 MG PO TABS
50.0000 mg | ORAL_TABLET | Freq: Four times a day (QID) | ORAL | Status: DC | PRN
  Filled 2022-07-16: qty 1

## 2022-07-16 MED ORDER — LIDOCAINE HCL (PF) 2 % IJ SOLN
INTRAMUSCULAR | Status: AC
  Filled 2022-07-16: qty 5

## 2022-07-16 MED ORDER — LACTATED RINGERS IV SOLN: INTRAVENOUS | Status: DC

## 2022-07-16 MED ORDER — 0.9 % SODIUM CHLORIDE (POUR BTL) OPTIME
TOPICAL | Status: DC | PRN
  Administered 2022-07-16: 1000 mL

## 2022-07-16 MED ORDER — BUPIVACAINE LIPOSOME 1.3 % IJ SUSP
INTRAMUSCULAR | Status: AC
  Filled 2022-07-16: qty 20

## 2022-07-16 MED ORDER — ACETAMINOPHEN 500 MG PO TABS
1000.0000 mg | ORAL_TABLET | ORAL | Status: AC
  Administered 2022-07-16: 1000 mg via ORAL
  Filled 2022-07-16: qty 2

## 2022-07-16 MED ORDER — PROPOFOL 10 MG/ML IV BOLUS
INTRAVENOUS | Status: DC | PRN
  Administered 2022-07-16: 200 mg via INTRAVENOUS

## 2022-07-16 MED ORDER — GABAPENTIN 300 MG PO CAPS
300.0000 mg | ORAL_CAPSULE | ORAL | Status: AC
  Administered 2022-07-16: 300 mg via ORAL
  Filled 2022-07-16: qty 1

## 2022-07-16 MED ORDER — PANTOPRAZOLE SODIUM 40 MG IV SOLR
40.0000 mg | Freq: Every day | INTRAVENOUS | Status: DC
  Administered 2022-07-16: 40 mg via INTRAVENOUS
  Filled 2022-07-16: qty 10

## 2022-07-16 MED ORDER — ALBUTEROL SULFATE HFA 108 (90 BASE) MCG/ACT IN AERS: 2.0000 | INHALATION_SPRAY | Freq: Four times a day (QID) | RESPIRATORY_TRACT | Status: DC | PRN

## 2022-07-16 MED ORDER — DEXAMETHASONE SODIUM PHOSPHATE 10 MG/ML IJ SOLN
INTRAMUSCULAR | Status: AC
  Filled 2022-07-16: qty 1

## 2022-07-16 MED ORDER — ENSURE MAX PROTEIN PO LIQD
2.0000 [oz_av] | ORAL | Status: DC
  Administered 2022-07-17 (×3): 2 [oz_av] via ORAL

## 2022-07-16 MED ORDER — MIDAZOLAM HCL 2 MG/2ML IJ SOLN
INTRAMUSCULAR | Status: DC | PRN
  Administered 2022-07-16: 2 mg via INTRAVENOUS

## 2022-07-16 MED ORDER — CHLORHEXIDINE GLUCONATE 0.12 % MT SOLN
15.0000 mL | Freq: Once | OROMUCOSAL | Status: AC
  Administered 2022-07-16: 15 mL via OROMUCOSAL

## 2022-07-16 MED ORDER — ESCITALOPRAM OXALATE 20 MG PO TABS
20.0000 mg | ORAL_TABLET | Freq: Every day | ORAL | Status: DC
  Administered 2022-07-17: 20 mg via ORAL
  Filled 2022-07-16 (×2): qty 1

## 2022-07-16 MED ORDER — ACETAMINOPHEN 500 MG PO TABS
1000.0000 mg | ORAL_TABLET | Freq: Three times a day (TID) | ORAL | Status: DC
  Administered 2022-07-16 – 2022-07-17 (×3): 1000 mg via ORAL
  Filled 2022-07-16 (×3): qty 2

## 2022-07-16 MED ORDER — BUPIVACAINE LIPOSOME 1.3 % IJ SUSP: 20.0000 mL | Freq: Once | INTRAMUSCULAR | Status: DC

## 2022-07-16 MED ORDER — SODIUM CHLORIDE 0.9 % IV SOLN: INTRAVENOUS | Status: DC

## 2022-07-16 MED ORDER — ACETAMINOPHEN 160 MG/5ML PO SOLN: 1000.0000 mg | Freq: Three times a day (TID) | ORAL | Status: DC

## 2022-07-16 MED ORDER — HYDROMORPHONE HCL 1 MG/ML IJ SOLN
INTRAMUSCULAR | Status: AC
  Filled 2022-07-16: qty 1

## 2022-07-16 MED ORDER — HEPARIN SODIUM (PORCINE) 5000 UNIT/ML IJ SOLN
5000.0000 [IU] | INTRAMUSCULAR | Status: AC
  Administered 2022-07-16: 5000 [IU] via SUBCUTANEOUS
  Filled 2022-07-16: qty 1

## 2022-07-16 MED ORDER — METHOCARBAMOL 1000 MG/10ML IJ SOLN: 500.0000 mg | Freq: Four times a day (QID) | INTRAVENOUS | Status: DC | PRN

## 2022-07-16 MED ORDER — DEXAMETHASONE SODIUM PHOSPHATE 10 MG/ML IJ SOLN
INTRAMUSCULAR | Status: DC | PRN
  Administered 2022-07-16: 4 mg via INTRAVENOUS

## 2022-07-16 MED ORDER — "VISTASEAL 4 ML SINGLE DOSE KIT "
4.0000 mL | PACK | Freq: Once | CUTANEOUS | Status: AC
  Administered 2022-07-16: 4 mL via TOPICAL
  Filled 2022-07-16: qty 4

## 2022-07-16 MED ORDER — FENTANYL CITRATE (PF) 100 MCG/2ML IJ SOLN
INTRAMUSCULAR | Status: DC | PRN
  Administered 2022-07-16 (×2): 100 ug via INTRAVENOUS

## 2022-07-16 MED ORDER — AMISULPRIDE (ANTIEMETIC) 5 MG/2ML IV SOLN: 10.0000 mg | Freq: Once | INTRAVENOUS | Status: DC | PRN

## 2022-07-16 SURGICAL SUPPLY — 86 items
ANTIFOG SOL W/FOAM PAD STRL (MISCELLANEOUS) ×1
APL LAPSCP 35 DL APL RGD (MISCELLANEOUS) ×1
APL PRP STRL LF DISP 70% ISPRP (MISCELLANEOUS) ×2
APL SKNCLS STERI-STRIP NONHPOA (GAUZE/BANDAGES/DRESSINGS) ×1
APL SWBSTK 6 STRL LF DISP (MISCELLANEOUS)
APPLICATOR COTTON TIP 6 STRL (MISCELLANEOUS) IMPLANT
APPLICATOR COTTON TIP 6IN STRL (MISCELLANEOUS)
APPLICATOR VISTASEAL 35 (MISCELLANEOUS) ×1 IMPLANT
APPLIER CLIP ROT 13.4 12 LRG (CLIP)
APR CLP LRG 13.4X12 ROT 20 MLT (CLIP)
BAG COUNTER SPONGE SURGICOUNT (BAG) IMPLANT
BAG SPNG CNTER NS LX DISP (BAG)
BENZOIN TINCTURE PRP APPL 2/3 (GAUZE/BANDAGES/DRESSINGS) ×1 IMPLANT
BLADE SURG SZ11 CARB STEEL (BLADE) ×1 IMPLANT
BNDG ADH 1X3 SHEER STRL LF (GAUZE/BANDAGES/DRESSINGS) ×6 IMPLANT
BNDG ADH THN 3X1 STRL LF (GAUZE/BANDAGES/DRESSINGS) ×6
CABLE HIGH FREQUENCY MONO STRZ (ELECTRODE) IMPLANT
CHLORAPREP W/TINT 26 (MISCELLANEOUS) ×2 IMPLANT
CLIP APPLIE ROT 13.4 12 LRG (CLIP) IMPLANT
CLIP SUT LAPRA TY ABSORB (SUTURE) ×2 IMPLANT
COVER SURGICAL LIGHT HANDLE (MISCELLANEOUS) ×1 IMPLANT
DEVICE SUT QUICK LOAD TK 5 (SUTURE) IMPLANT
DEVICE SUT TI-KNOT TK 5X26 (SUTURE) IMPLANT
DEVICE SUTURE ENDOST 10MM (ENDOMECHANICALS) ×1 IMPLANT
DRAIN PENROSE 0.25X18 (DRAIN) ×1 IMPLANT
ELECT REM PT RETURN 15FT ADLT (MISCELLANEOUS) ×1 IMPLANT
GAUZE 4X4 16PLY ~~LOC~~+RFID DBL (SPONGE) ×1 IMPLANT
GAUZE SPONGE 4X4 12PLY STRL (GAUZE/BANDAGES/DRESSINGS) IMPLANT
GLOVE BIO SURGEON STRL SZ 6 (GLOVE) ×1 IMPLANT
GLOVE INDICATOR 6.5 STRL GRN (GLOVE) ×1 IMPLANT
GOWN STRL REUS W/ TWL LRG LVL3 (GOWN DISPOSABLE) ×1 IMPLANT
GOWN STRL REUS W/ TWL XL LVL3 (GOWN DISPOSABLE) IMPLANT
GOWN STRL REUS W/TWL LRG LVL3 (GOWN DISPOSABLE) ×1
GOWN STRL REUS W/TWL XL LVL3 (GOWN DISPOSABLE)
IRRIG SUCT STRYKERFLOW 2 WTIP (MISCELLANEOUS) ×1
IRRIGATION SUCT STRKRFLW 2 WTP (MISCELLANEOUS) ×1 IMPLANT
KIT BASIN OR (CUSTOM PROCEDURE TRAY) ×1 IMPLANT
KIT GASTRIC LAVAGE 34FR ADT (SET/KITS/TRAYS/PACK) ×1 IMPLANT
KIT TURNOVER KIT A (KITS) IMPLANT
MARKER SKIN DUAL TIP RULER LAB (MISCELLANEOUS) ×1 IMPLANT
MAT PREVALON FULL STRYKER (MISCELLANEOUS) ×1 IMPLANT
NDL SPNL 22GX3.5 QUINCKE BK (NEEDLE) ×1 IMPLANT
NEEDLE SPNL 22GX3.5 QUINCKE BK (NEEDLE) ×1 IMPLANT
PACK CARDIOVASCULAR III (CUSTOM PROCEDURE TRAY) ×1 IMPLANT
PENCIL SMOKE EVACUATOR (MISCELLANEOUS) IMPLANT
RELOAD ENDO STITCH 2.0 (ENDOMECHANICALS) ×9
RELOAD STAPLE 60 2.6 WHT THN (STAPLE) ×2 IMPLANT
RELOAD STAPLE 60 3.6 BLU REG (STAPLE) ×2 IMPLANT
RELOAD STAPLE 60 3.8 GOLD REG (STAPLE) IMPLANT
RELOAD STAPLE 60 4.1 GRN THCK (STAPLE) ×1 IMPLANT
RELOAD STAPLER BLUE 60MM (STAPLE) ×5 IMPLANT
RELOAD STAPLER GOLD 60MM (STAPLE) ×1 IMPLANT
RELOAD STAPLER GREEN 60MM (STAPLE) IMPLANT
RELOAD STAPLER WHITE 60MM (STAPLE) ×2 IMPLANT
RELOAD SUT SNGL STCH ABSRB 2-0 (ENDOMECHANICALS) ×4 IMPLANT
RELOAD SUT SNGL STCH BLK 2-0 (ENDOMECHANICALS) ×4 IMPLANT
SCISSORS LAP 5X45 EPIX DISP (ENDOMECHANICALS) ×1 IMPLANT
SET TUBE SMOKE EVAC HIGH FLOW (TUBING) ×1 IMPLANT
SHEARS HARMONIC ACE PLUS 45CM (MISCELLANEOUS) ×1 IMPLANT
SLEEVE ADV FIXATION 12X100MM (TROCAR) IMPLANT
SLEEVE Z-THREAD 5X100MM (TROCAR) ×2 IMPLANT
SOLUTION ANTFG W/FOAM PAD STRL (MISCELLANEOUS) ×1 IMPLANT
STAPLER ECHELON BIOABSB 60 FLE (MISCELLANEOUS) IMPLANT
STAPLER ECHELON LONG 60 440 (INSTRUMENTS) ×1 IMPLANT
STAPLER RELOAD BLUE 60MM (STAPLE) ×5
STAPLER RELOAD GOLD 60MM (STAPLE) ×1
STAPLER RELOAD GREEN 60MM (STAPLE)
STAPLER RELOAD WHITE 60MM (STAPLE) ×2
STRIP CLOSURE SKIN 1/2X4 (GAUZE/BANDAGES/DRESSINGS) ×1 IMPLANT
SUT MNCRL AB 4-0 PS2 18 (SUTURE) ×1 IMPLANT
SUT RELOAD ENDO STITCH 2 48X1 (ENDOMECHANICALS) ×5
SUT RELOAD ENDO STITCH 2.0 (ENDOMECHANICALS) ×4
SUT SURGIDAC NAB ES-9 0 48 120 (SUTURE) IMPLANT
SUT VIC AB 2-0 SH 27 (SUTURE) ×1
SUT VIC AB 2-0 SH 27X BRD (SUTURE) ×1 IMPLANT
SUTURE RELOAD END STTCH 2 48X1 (ENDOMECHANICALS) ×5 IMPLANT
SUTURE RELOAD ENDO STITCH 2.0 (ENDOMECHANICALS) ×4 IMPLANT
SYR 20ML ECCENTRIC (SYRINGE) ×1 IMPLANT
SYR 20ML LL LF (SYRINGE) ×2 IMPLANT
TOWEL OR 17X26 10 PK STRL BLUE (TOWEL DISPOSABLE) ×1 IMPLANT
TOWEL OR NON WOVEN STRL DISP B (DISPOSABLE) ×1 IMPLANT
TRAY FOLEY MTR SLVR 16FR STAT (SET/KITS/TRAYS/PACK) ×1 IMPLANT
TROCAR ADV FIXATION 12X100MM (TROCAR) ×1 IMPLANT
TROCAR XCEL NON-BLD 5MMX100MML (ENDOMECHANICALS) ×1 IMPLANT
TROCAR Z-THREAD OPTICAL 5X100M (TROCAR) ×1 IMPLANT
TUBING CONNECTING 10 (TUBING) IMPLANT

## 2022-07-16 NOTE — Transfer of Care (Signed)
Immediate Anesthesia Transfer of Care Note  Patient: Adriana Moon  Procedure(s) Performed: LAPAROSCOPIC ROUX-EN-Y GASTRIC BYPASS WITH UPPER ENDOSCOPY  Patient Location: General  Anesthesia Type:General  Level of Consciousness: awake, alert , and oriented  Airway & Oxygen Therapy: Patient Spontanous Breathing and Patient connected to face mask oxygen  Post-op Assessment: Report given to RN, Post -op Vital signs reviewed and stable, and Patient moving all extremities X 4  Post vital signs: Reviewed and stable  Last Vitals:  Vitals Value Taken Time  BP 134/66 07/16/22 1330  Temp 36.5 C 07/16/22 1325  Pulse 79 07/16/22 1334  Resp 26 07/16/22 1334  SpO2 100 % 07/16/22 1334  Vitals shown include unvalidated device data.  Last Pain:  Vitals:   07/16/22 0932  TempSrc:   PainSc: 0-No pain         Complications: No notable events documented.

## 2022-07-16 NOTE — Anesthesia Preprocedure Evaluation (Addendum)
Anesthesia Evaluation  Patient identified by MRN, date of birth, ID band Patient awake    Reviewed: Allergy & Precautions, NPO status , Patient's Chart, lab work & pertinent test results, reviewed documented beta blocker date and time   Airway Mallampati: I       Dental no notable dental hx. (+) Teeth Intact   Pulmonary asthma    breath sounds clear to auscultation + decreased breath sounds      Cardiovascular negative cardio ROS Normal cardiovascular exam Rhythm:Regular Rate:Normal     Neuro/Psych  Headaches PSYCHIATRIC DISORDERS Anxiety Depression       GI/Hepatic ,GERD  Medicated,,  Endo/Other    Morbid obesitySuper MO Hx/o breast Ca on tamoxifen, S/P bilateral mastectomies with reconstruction  Renal/GU Renal InsufficiencyRenal disease  negative genitourinary   Musculoskeletal  (+) Arthritis , Osteoarthritis,    Abdominal  (+) + obese  Peds  Hematology negative hematology ROS (+)   Anesthesia Other Findings   Reproductive/Obstetrics                             Anesthesia Physical Anesthesia Plan  ASA: 3  Anesthesia Plan: General   Post-op Pain Management: Dilaudid IV, Ofirmev IV (intra-op)* and Precedex   Induction: Intravenous and Cricoid pressure planned  PONV Risk Score and Plan: 4 or greater and Treatment may vary due to age or medical condition, Midazolam, Scopolamine patch - Pre-op, Ondansetron and Dexamethasone  Airway Management Planned: Oral ETT  Additional Equipment: None  Intra-op Plan:   Post-operative Plan: Extubation in OR  Informed Consent: I have reviewed the patients History and Physical, chart, labs and discussed the procedure including the risks, benefits and alternatives for the proposed anesthesia with the patient or authorized representative who has indicated his/her understanding and acceptance.     Dental advisory given  Plan Discussed with: CRNA  and Anesthesiologist  Anesthesia Plan Comments:         Anesthesia Quick Evaluation

## 2022-07-16 NOTE — Op Note (Signed)
Operative Note  Adriana Rasberry  [id number]  [id number]  07/16/2022   Surgeon: Romana Juniper MD FACS   Assistant: Kaylyn Lim MD FACS   Procedure performed: laparoscopic Roux-en-Y gastric bypass (antecolic, antegastric; [id number] BP and 100cm roux) , upper endoscopy   Preop diagnosis: Morbid obesity Body mass index is 58.82 kg/m., Post-op diagnosis/intraop findings: same   Specimens: none Retained items: none  EBL: minimal cc Complications: none   Description of procedure: After obtaining informed consent and administration of prophylactic heparin in holding, the patient was taken to the operating room and placed supine on operating room table where general endotracheal anesthesia was initiated, preoperative antibiotics were administered, SCDs applied, and a formal timeout was performed. The abdomen was prepped and draped in usual sterile fashion. Peritoneal access was gained using a Visiport technique in the left upper quadrant and insufflation to 15 mmHg ensued without issue. Gross inspection revealed no evidence of injury. Under direct visualization the remaining trochars were inserted. A laparoscopic assisted bilateral taps block was performed using Exparel mixed with Marcaine.  The omentum was reflected cephalad and the ligament of Treitz identified. The small bowel was followed to a point 50 cm distal to ligament of Treitz at which location the bowel was divided with a white load linear cutting stapler. A Penrose was sutured to the Roux side of the staple line for future identification. The bowel was measured another 100 cm distal to this and and the site for the jejunojejunostomy was aligned with the end of the biliopancreatic limb. Enterotomies were made with the Harmonic scalpel and the anastomosis was created with the 60 mm white load linear cutting stapler. The common enterotomy was closed with running 3-0 Vicryl starting on either end and tying centrally. The mesenteric  defect was closed with running silk suture secured with Lapra-Ty's. The anastomosis was inspected and appeared widely patent, hemostatic with no gaps in the suture line. Vistaseal was injected over the anastomosis.  The patient was then placed in steep reverse Trendelenburg. The liver retractor was inserted through a subxiphoid incision and secured for fixed retraction of the left lobe. The Harmonic scalpel was used to enter the perigastric plane and the lesser sac at a point 5 cm distal to the GE junction on the lesser curve. The angle of His was gently bluntly dissected and the target shape of the pouch visualized to exclude any residual fundus. After confirming that all tubes have been removed from the stomach, the gastric pouch was created with serial fires of the linear cutting stapler. The Roux limb with its attached Penrose drain was then identified and brought up to meet the gastric pouch ensuring no twist in the small bowel mesentery. The staple line of the small bowel is directed to the patient's left side; the blind end ("candy cane") measures less than 3cm in length. A running 3-0 Vicryl was used to create a posterior suture line for our anastomosis between the gastric pouch and the small bowel. Gastrotomy and enterotomy was made with the Harmonic scalpel and a blue load linear cutting stapler was used to create a gastrojejunal anastomosis approximately 2.5cm wide. The common enterotomy was closed with running 3-0 Vicryl starting at either end and tying centrally. At this juncture the Ewald tube was passed through the gastrojejunal anastomosis. An anterior layer of running 3-0 Vicryl was used to complete the gastrojejunal anastomosis. The ewald tube was removed without difficulty. The [place] space was closed with a figure-of-eight silk suture.  At this point  the assistant performed an upper endoscopy with the Roux limb gently clamped with a bowel clamp. Irrigation is instilled in the upper abdomen  for a leak test. Please see his separate operative note- the anastomosis is noted to be patent and hemostatic without any leak or bubbles present. The endoscope was removed and the abdomen once again surveyed. There was a small amount of bleeding along the lateral edge of the pouch- 3 clips were placed here. Hemostasis was then confirmed. Additional vistaseal was sprayed over the gastrojejunal anastomosis. The liver retractor was removed under direct visualization. The abdomen was then desufflated and all remaining trochars removed. The skin incisions were closed with subcuticular 4-0 Monocryl; benzoin, Steri-Strips and Band-Aids were applied The patient was then awakened, extubated and taken to PACU in stable condition.     All counts were correct at the completion of the case.

## 2022-07-16 NOTE — Anesthesia Postprocedure Evaluation (Signed)
Anesthesia Post Note  Patient: Adriana Moon  Procedure(s) Performed: LAPAROSCOPIC ROUX-EN-Y GASTRIC BYPASS WITH UPPER ENDOSCOPY     Patient location during evaluation: PACU Anesthesia Type: General Level of consciousness: awake and alert and oriented Pain management: pain level controlled Vital Signs Assessment: post-procedure vital signs reviewed and stable Respiratory status: spontaneous breathing, nonlabored ventilation and respiratory function stable Cardiovascular status: blood pressure returned to baseline and stable Postop Assessment: no apparent nausea or vomiting Anesthetic complications: no   No notable events documented.  Last Vitals:  Vitals:   07/16/22 1400 07/16/22 1415  BP: 129/75 131/75  Pulse: 78 65  Resp: (!) 23 20  Temp:    SpO2: 94% 94%    Last Pain:  Vitals:   07/16/22 1415  TempSrc:   PainSc: Asleep                 Kamar Callender A.

## 2022-07-16 NOTE — Interval H&P Note (Signed)
History and Physical Interval Note:  07/16/2022 9:50 AM  Adriana Moon  has presented today for surgery, with the diagnosis of MORBID OBESITY.  The various methods of treatment have been discussed with the patient and family. After consideration of risks, benefits and other options for treatment, the patient has consented to  Procedure(s): LAPAROSCOPIC ROUX-EN-Y GASTRIC BYPASS WITH UPPER ENDOSCOPY (N/A) as a surgical intervention.  The patient's history has been reviewed, patient examined, no change in status, stable for surgery.  I have reviewed the patient's chart and labs.  Questions were answered to the patient's satisfaction.     [name] Rich Brave

## 2022-07-16 NOTE — Progress Notes (Signed)
PHARMACY CONSULT FOR:  Risk Assessment for Post-Discharge VTE Following Bariatric Surgery  Post-Discharge VTE Risk Assessment: This patient's probability of 30-day post-discharge VTE is increased due to the factors marked:  Sleeve gastrectomy   Liver disorder (transplant, cirrhosis, or nonalcoholic steatohepatitis)   Hx of VTE   Hemorrhage requiring transfusion   GI perforation, leak, or obstruction   ====================================================    Female    Age >/=60 years    BMI >/=50 kg/m2    CHF    Dyspnea at Rest    Paraplegia    Non-gastric-band surgery    Operation Time >/=3 hr    Return to OR     Length of Stay >/= 3 d   Hypercoagulable condition   Significant venous stasis      Predicted probability of 30-day post-discharge VTE: 0.27%   Recommendation for Discharge: No pharmacologic prophylaxis post-discharge   Adriana Moon is a 42 y.o. female who underwent  laparoscopic Roux-en-Y gastric bypass on 07/16/22.   Case start: 1100 Case end: 1315   Allergies  Allergen Reactions   Nsaids Hives, Swelling and Anaphylaxis   Prednisone     Lost taste in her mouth, also makes "me crazy & mean"    Dust Mite Extract Cough   Other Cough    pollen   Pollen Extract Cough   Percocet [Oxycodone-Acetaminophen] Nausea Only    Very lightheaded.   Shellfish Allergy Hives and Swelling    Patient Measurements: Height: 4\' 11"  (149.9 cm) Weight: 132.1 kg (291 lb 3.2 oz) IBW/kg (Calculated) : 43.2 Body mass index is 58.82 kg/m.  No results for input(s): "WBC", "HGB", "HCT", "PLT", "APTT", "CREATININE", "LABCREA", "CREAT24HRUR", "MG", "PHOS", "ALBUMIN", "PROT", "AST", "ALT", "ALKPHOS", "BILITOT", "BILIDIR", "IBILI" in the last 72 hours. Estimated Creatinine Clearance: 89.4 mL/min (A) (by C-G formula based on SCr of 1.04 mg/dL (H)).    Past Medical History:  Diagnosis Date   Angio-edema    Anxiety    manages naturally   Arthritis of both knees     Asthma    Bilateral swelling of feet    Breast cancer (HCC)    twice   Depression    pt. reports that she tends to get down & has had Lexapro in the past but its been taken off her list for now.    Family history of breast cancer    Family history of kidney cancer    Family history of lung cancer    Family history of prostate cancer    GERD (gastroesophageal reflux disease)    IBS (irritable bowel syndrome)    Insulin resistance    Joint pain    Kidney failure    stage 1   Lactose intolerance    Migraines    PCOS (polycystic ovarian syndrome)    Urticaria    Varicose veins of legs      Medications Prior to Admission  Medication Sig Dispense Refill Last Dose   albuterol (VENTOLIN HFA) 108 (90 Base) MCG/ACT inhaler Inhale 2 puffs into the lungs every 6 (six) hours as needed for wheezing or shortness of breath. 8 g 2 Past Week   cetirizine (ZYRTEC) 10 MG tablet Take 1-2 tablets 1-2 times a day as needed 180 tablet 3 Past Week   escitalopram (LEXAPRO) 20 MG tablet Take 1 tablet (20 mg total) by mouth daily. 90 tablet 3 07/15/2022   famotidine (PEPCID) 20 MG tablet Take 20 mg by mouth daily.   07/15/2022   fluconazole (DIFLUCAN) 150  MG tablet TAKE 1 TABLET BY MOUTH ONCE EVERY MONTH BEFORE  PERIOD 3 tablet 0 Past Week   fluticasone (FLONASE) 50 MCG/ACT nasal spray Place 2 sprays into both nostrils daily. (Patient taking differently: Place 2 sprays into both nostrils daily as needed for allergies.) 16 g 6 Past Month   Fluticasone-Umeclidin-Vilant (TRELEGY ELLIPTA) 100-62.5-25 MCG/ACT AEPB Inhale 1 puff into the lungs daily. 1 each 11 07/15/2022   MAGNESIUM PO Take 3 tablets by mouth daily.   Past Week   metFORMIN (GLUCOPHAGE) 500 MG tablet Take 1 tablet (500 mg total) by mouth 2 (two) times daily with a meal. 180 tablet 1 Past Month   metroNIDAZOLE (FLAGYL) 500 MG tablet Take 1 tablet (500 mg total) by mouth 2 (two) times daily. 14 tablet 0 07/15/2022   omeprazole (PRILOSEC) 40 MG capsule  Take 1 capsule (40 mg total) by mouth daily. 90 capsule 0 07/15/2022   tamoxifen (NOLVADEX) 20 MG tablet Take 1 tablet (20 mg total) by mouth daily. 90 tablet 3 Past Week   cyclobenzaprine (FLEXERIL) 10 MG tablet Take 1 tablet by mouth three times daily as needed for muscle spasm (Patient not taking: Reported on 07/11/2022) 90 tablet 0 Not Taking   Spacer/Aero Chamber Mouthpiece MISC 1 each by Does not apply route every 6 (six) hours as needed. 1 each 0    Vitamin D, Ergocalciferol, (DRISDOL) 1.25 MG (50000 UNIT) CAPS capsule Take 1 capsule (50,000 Units total) by mouth every 7 (seven) days. 12 capsule 0        Jonay Hitchcock Pietro Cassis 07/16/2022,4:24 PM

## 2022-07-16 NOTE — Op Note (Signed)
Adriana Moon [id number] Sep 12, 1980 07/16/2022  Preoperative diagnosis: gastric bypass in progress  Postoperative diagnosis: Same   Procedure: Upper endoscopy   Surgeon: Catalina Antigua B. Hassell Done  M.D., FACS   Anesthesia: Gen.   Indications for procedure: This patient was undergoing a gastric bypass by Dr. Kae Heller.    Description of procedure: The endoscopy was placed in the mouth and into the oropharynx and under endoscopic vision it was advanced to the esophagogastric junction.  The pouch was insufflated and was small and ~ 5 cm in length with a patent anastomosis.  No hiatal hernia was noted.   No bleeding or leaks were detected.  The scope was withdrawn without difficulty.     Matt B. Hassell Done, MD, FACS General, Bariatric, & Minimally Invasive Surgery [facility], [place]

## 2022-07-16 NOTE — Progress Notes (Signed)

## 2022-07-16 NOTE — Anesthesia Procedure Notes (Signed)
Procedure Name: Intubation Date/Time: 07/16/2022 10:40 AM  Performed by: Niel Hummer, CRNAPre-anesthesia Checklist: Patient identified, Emergency Drugs available, Suction available and Patient being monitored Patient Re-evaluated:Patient Re-evaluated prior to induction Oxygen Delivery Method: Circle system utilized Preoxygenation: Pre-oxygenation with 100% oxygen Induction Type: IV induction Ventilation: Mask ventilation without difficulty Laryngoscope Size: Mac and 4 Grade View: Grade I Tube type: Oral Tube size: 7.0 mm Number of attempts: 1 Airway Equipment and Method: Stylet Placement Confirmation: ETT inserted through vocal cords under direct vision, positive ETCO2 and breath sounds checked- equal and bilateral Secured at: 22 cm Tube secured with: Tape Dental Injury: Teeth and Oropharynx as per pre-operative assessment

## 2022-07-17 ENCOUNTER — Other Ambulatory Visit (HOSPITAL_COMMUNITY): Payer: Self-pay

## 2022-07-17 ENCOUNTER — Other Ambulatory Visit: Payer: Self-pay

## 2022-07-17 ENCOUNTER — Encounter (HOSPITAL_COMMUNITY): Payer: Self-pay | Admitting: Surgery

## 2022-07-17 LAB — COMPREHENSIVE METABOLIC PANEL
ALT: 25 U/L (ref 0–44)
AST: 40 U/L (ref 15–41)
Albumin: 3.3 g/dL — ABNORMAL LOW (ref 3.5–5.0)
Alkaline Phosphatase: 30 U/L — ABNORMAL LOW (ref 38–126)
Anion gap: 8 (ref 5–15)
BUN: 11 mg/dL (ref 6–20)
CO2: 20 mmol/L — ABNORMAL LOW (ref 22–32)
Calcium: 8.4 mg/dL — ABNORMAL LOW (ref 8.9–10.3)
Chloride: 110 mmol/L (ref 98–111)
Creatinine, Ser: 0.95 mg/dL (ref 0.44–1.00)
GFR, Estimated: >60 mL/min (ref >60)
Glucose, Bld: 98 mg/dL (ref 70–99)
Potassium: 3.9 mmol/L (ref 3.5–5.1)
Sodium: 138 mmol/L (ref 135–145)
Total Bilirubin: 0.5 mg/dL (ref 0.3–1.2)
Total Protein: 6.6 g/dL (ref 6.5–8.1)

## 2022-07-17 LAB — CBC
HCT: 33.5 % — ABNORMAL LOW (ref 36.0–46.0)
Hemoglobin: 11 g/dL — ABNORMAL LOW (ref 12.0–15.0)
MCH: 27.4 pg (ref 26.0–34.0)
MCHC: 32.8 g/dL (ref 30.0–36.0)
MCV: 83.5 fL (ref 80.0–100.0)
Platelets: 296 10*3/uL (ref 150–400)
RBC: 4.01 MIL/uL (ref 3.87–5.11)
RDW: 13.2 % (ref 11.5–15.5)
WBC: 6.5 10*3/uL (ref 4.0–10.5)
nRBC: 0 % (ref 0.0–0.2)

## 2022-07-17 LAB — MAGNESIUM: Magnesium: 2 mg/dL (ref 1.7–2.4)

## 2022-07-17 MED ORDER — DOCUSATE SODIUM 100 MG PO CAPS
100.0000 mg | ORAL_CAPSULE | Freq: Two times a day (BID) | ORAL | 0 refills | Status: AC
  Filled 2022-07-17: qty 100, 50d supply, fill #0

## 2022-07-17 MED ORDER — GABAPENTIN 100 MG PO CAPS
200.0000 mg | ORAL_CAPSULE | Freq: Two times a day (BID) | ORAL | 0 refills | Status: DC
  Filled 2022-07-17: qty 20, 5d supply, fill #0

## 2022-07-17 MED ORDER — ENOXAPARIN SODIUM 60 MG/0.6ML IJ SOSY
60.0000 mg | PREFILLED_SYRINGE | Freq: Two times a day (BID) | INTRAMUSCULAR | 0 refills | Status: DC
  Filled 2022-07-17: qty 10.8, 9d supply, fill #0
  Filled 2022-07-17: qty 6, 5d supply, fill #0
  Filled 2022-07-18: qty 10.8, 9d supply, fill #0
  Filled 2022-07-18: qty 6, 5d supply, fill #0

## 2022-07-17 MED ORDER — ENOXAPARIN SODIUM 60 MG/0.6ML IJ SOSY
60.0000 mg | PREFILLED_SYRINGE | Freq: Two times a day (BID) | INTRAMUSCULAR | Status: DC
  Administered 2022-07-17: 60 mg via SUBCUTANEOUS
  Filled 2022-07-17 (×2): qty 0.6

## 2022-07-17 MED ORDER — PANTOPRAZOLE SODIUM 40 MG PO TBEC
40.0000 mg | DELAYED_RELEASE_TABLET | Freq: Every day | ORAL | 0 refills | Status: DC
  Filled 2022-07-17: qty 30, 30d supply, fill #0
  Filled 2022-10-15: qty 30, 30d supply, fill #1

## 2022-07-17 MED ORDER — ACETAMINOPHEN 500 MG PO TABS: 1000.0000 mg | ORAL_TABLET | Freq: Three times a day (TID) | ORAL | 0 refills | Status: AC

## 2022-07-17 MED ORDER — TRAMADOL HCL 50 MG PO TABS
50.0000 mg | ORAL_TABLET | Freq: Four times a day (QID) | ORAL | 0 refills | Status: DC | PRN
  Filled 2022-07-17: qty 10, 3d supply, fill #0

## 2022-07-17 MED ORDER — ONDANSETRON 4 MG PO TBDP
4.0000 mg | ORAL_TABLET | Freq: Four times a day (QID) | ORAL | 0 refills | Status: DC | PRN
  Filled 2022-07-17: qty 20, 5d supply, fill #0

## 2022-07-17 MED ORDER — ENOXAPARIN (LOVENOX) PATIENT EDUCATION KIT
PACK | Freq: Once | Status: AC
  Filled 2022-07-17: qty 1

## 2022-07-17 NOTE — Discharge Summary (Signed)
Physician Discharge Summary  Adriana Moon [id number] DOB: 1981/04/07 DOA: 07/16/2022  PCP: Florian Buff, MD  Admit date: 07/16/2022 Discharge date: 07/17/2022  Recommendations for Outpatient Follow-up:     Follow-up Information     Clovis Riley, MD Follow up on 08/07/2022.   Specialty: General Surgery Why: Please arrive 15 minutes prior to your appointment at 11:30am Contact information: [address] [place] [address] [phone]         Clovis Riley, MD Follow up on 09/06/2022.   Specialty: General Surgery Why: Please arrive 15 minutes prior to your appointment at 11:30am Contact information: [address] [place] [address] [phone]                Discharge Diagnoses:  Principal Problem:   Morbid obesity [facility])   Surgical Procedure: Laparoscopic Roux-en-Y gastric bypass, upper endoscopy  Discharge Condition: Good Disposition: Home  Diet recommendation: Postoperative gastric bypass diet  Filed Weights   07/16/22 0920  Weight: 132.1 kg     Hospital Course:  The patient was admitted for a planned laparoscopic Roux-en-Y gastric bypass. Please see operative note. Preoperatively the patient was given 5000 units of subcutaneous heparin for DVT prophylaxis. ERAS protocol was used. Postoperative prophylactic heparin dosing was started on the evening of postoperative day 0.  The patient was started on ice chips and water on the evening of POD 0 which they tolerated. On postoperative day 1 The patient's diet was advanced to protein shakes which they also tolerated. The patient was ambulating without difficulty. Their vital signs are stable without fever or tachycardia. Their hemoglobin had remained stable. The patient had received discharge instructions and counseling. They were deemed stable for discharge.  BP 115/73 (BP Location: Left Arm)   Pulse 80   Temp 98.7 F (37.1 C) (Oral)   Resp  14   Ht 4\' 11"  (1.499 m)   Wt 132.1 kg   LMP 07/10/2022 (Exact Date)   SpO2 96%   BMI 58.82 kg/m   See rounding note  Discharge Instructions   Allergies as of 07/17/2022       Reactions   Nsaids Hives, Swelling, Anaphylaxis   Prednisone    Lost taste in her mouth, also makes "me crazy & mean"    Dust Mite Extract Cough   Other Cough   pollen   Pollen Extract Cough   Percocet [oxycodone-acetaminophen] Nausea Only   Very lightheaded.   Shellfish Allergy Hives, Swelling        Medication List     STOP taking these medications    cyclobenzaprine 10 MG tablet Commonly known as: FLEXERIL   famotidine 20 MG tablet Commonly known as: PEPCID   metFORMIN 500 MG tablet Commonly known as: GLUCOPHAGE   metroNIDAZOLE 500 MG tablet Commonly known as: FLAGYL   omeprazole 40 MG capsule Commonly known as: PRILOSEC       TAKE these medications    acetaminophen 500 MG tablet Commonly known as: TYLENOL Take 2 tablets (1,000 mg total) by mouth every 8 (eight) hours for 5 days.   albuterol 108 (90 Base) MCG/ACT inhaler Commonly known as: VENTOLIN HFA Inhale 2 puffs into the lungs every 6 (six) hours as needed for wheezing or shortness of breath.   cetirizine 10 MG tablet Commonly known as: ZYRTEC Take 1-2 tablets 1-2 times a day as needed   docusate sodium 100 MG capsule Commonly known as: Colace Take 1 capsule (100 mg total)  by mouth 2 (two) times daily. Okay to decrease to once daily or stop taking if having loose bowel movements   enoxaparin 60 MG/0.6ML injection Commonly known as: LOVENOX Inject 0.6 mLs (60 mg total) into the skin every 12 (twelve) hours for 14 days.   escitalopram 20 MG tablet Commonly known as: Lexapro Take 1 tablet (20 mg total) by mouth daily.   fluconazole 150 MG tablet Commonly known as: DIFLUCAN TAKE 1 TABLET BY MOUTH ONCE EVERY MONTH BEFORE  PERIOD   fluticasone 50 MCG/ACT nasal spray Commonly known as: FLONASE Place 2 sprays  into both nostrils daily. What changed:  when to take this reasons to take this   gabapentin 100 MG capsule Commonly known as: NEURONTIN Take 2 capsules (200 mg total) by mouth every 12 (twelve) hours.   MAGNESIUM PO Take 3 tablets by mouth daily.   ondansetron 4 MG disintegrating tablet Commonly known as: ZOFRAN-ODT Dissolve 1 tablet (4 mg total) by mouth every 6 (six) hours as needed for nausea or vomiting.   pantoprazole 40 MG tablet Commonly known as: PROTONIX Take 1 tablet (40 mg total) by mouth daily. Take this medication daily, regardless of reflux symptoms, for the first 3 months after surgery   Spacer/Aero Chamber Mouthpiece Misc 1 each by Does not apply route every 6 (six) hours as needed.   tamoxifen 20 MG tablet Commonly known as: NOLVADEX Take 1 tablet (20 mg total) by mouth daily. Notes to patient: Medication may not be effective for the next 30 days.  Consult with primary care physician if needed. Use precaution if necessary.     traMADol 50 MG tablet Commonly known as: ULTRAM Take 1 tablet (50 mg total) by mouth every 6 (six) hours as needed (pain).   Trelegy Ellipta 100-62.5-25 MCG/ACT Aepb Generic drug: Fluticasone-Umeclidin-Vilant Inhale 1 puff into the lungs daily.   Vitamin D (Ergocalciferol) 1.25 MG (50000 UNIT) Caps capsule Commonly known as: DRISDOL Take 1 capsule (50,000 Units total) by mouth every 7 (seven) days.        Follow-up Information     Clovis Riley, MD Follow up on 08/07/2022.   Specialty: General Surgery Why: Please arrive 15 minutes prior to your appointment at 11:30am Contact information: [address] [place] [address] [phone]         Clovis Riley, MD Follow up on 09/06/2022.   Specialty: General Surgery Why: Please arrive 15 minutes prior to your appointment at 11:30am Contact information: [address] [place] [address] [phone]                   The results of significant diagnostics from this hospitalization (including imaging, microbiology, ancillary and laboratory) are listed below for reference.    Significant Diagnostic Studies: No results found.  Labs: Basic Metabolic Panel: Recent Labs  Lab 07/12/22 1112 07/17/22 0449  NA 137 138  K 3.5 3.9  CL 107 110  CO2 23 20*  GLUCOSE 110* 98  BUN 12 11  CREATININE 1.04* 0.95  CALCIUM 8.5* 8.4*  MG  --  2.0   Liver Function Tests: Recent Labs  Lab 07/12/22 1112 07/17/22 0449  AST 18 40  ALT 14 25  ALKPHOS 34* 30*  BILITOT 0.7 0.5  PROT 7.3 6.6  ALBUMIN 3.8 3.3*    CBC: Recent Labs  Lab 07/12/22 1112 07/17/22 0449  WBC 5.4 6.5  NEUTROABS 2.2  --   HGB 12.1 11.0*  HCT  36.7 33.5*  MCV 85.0 83.5  PLT 325 296    CBG: No results for input(s): "GLUCAP" in the last 168 hours.  Principal Problem:   Morbid obesity ([place])     Signed:  [place] [phone] 07/17/2022, 1:57 PM

## 2022-07-17 NOTE — Progress Notes (Signed)
Patient alert and oriented, pain is controlled. Patient is tolerating fluids, advanced to protein shake today, patient is tolerating well. Reviewed Gastric sleeve discharge instructions with patient and patient is able to articulate understanding. Provided information on BELT program, Support Group and [facility] outpatient pharmacy. Communicated general update of patient status to surgeon. All questions answered. 24hr fluid recall is 480 mL per hydration protocol, bariatric nurse coordinator to make follow-up phone call within one week.    Thank you,  Calton Dach, RN, MSN Bariatric Nurse Coordinator [phone] (office)

## 2022-07-17 NOTE — TOC CM/SW Note (Signed)
Transition of Care [facility]) Screening Note  Patient Details  Name: Mykala Frieden Date of Birth: July 24, 1980  [facility] CM/SW Contact:    Sherie Don, LCSW Phone Number: 07/17/2022, 8:49 AM  Transition of Care Department [facility]) has reviewed patient and no TOC needs have been identified at this time. We will continue to monitor patient advancement through interdisciplinary progression rounds. If new patient transition needs arise, please place a TOC consult.

## 2022-07-17 NOTE — Progress Notes (Signed)
S: Had some gas pain yesterday afternoon but when she was able to walk around this dissipated.  Uneventful night.  Denies any nausea or pain currently.  She is started her protein shakes and is tolerating these without any issue.  O: Vitals, labs, intake/output, and orders reviewed at this time.  Afebrile, no tachycardia, normotensive, sats 97% on room air. PO 480; UOP 2350.  CMP unremarkable, mag normal, WBC 6.5 (5.4 preop), hemoglobin 11 (12.1), platelets 296 (325)   Gen: A&Ox3, no distress  Chest: unlabored respirations, RRR Abd: soft, nontender, nondistended, incision(s) c/d/i with Steri-Strips and Band-Aids.  No cellulitis or hematoma Ext: warm, no edema Neuro: grossly normal  Lines/tubes/drains: PIV  A/P: Postop day 1 status post laparoscopic Roux-en-Y gastric bypass, doing well -Continue clear liquids and protein shakes as tolerated -Continue aggressive pulmonary toilet, frequent ambulation, chemical and mechanical DVT prophylaxis -Will plan to continue Lovenox 60 mg q12h for 2 weeks at discharge given cancer history, uninterrupted tamoxifen use, BMI 58.8.  Will send a message to her oncologist as tamoxifen levels may need to be monitored to ensure therapeutic doses continued after gastric bypass.  She will likely be able to discharge later today if she continues to do well.   Romana Juniper, MD [facility], [place]

## 2022-07-17 NOTE — Progress Notes (Signed)
Reviewed written D/C instructions with pt and all questions answered. Pt verbalized understanding. Pt left in stable condition with all belongings.

## 2022-07-17 NOTE — Progress Notes (Signed)
Gave pt teaching on administering lovenox injections. Pt demonstrated self administration of lovenox and is proficient. Pt verbalized understanding and has no further questions.

## 2022-07-17 NOTE — Progress Notes (Addendum)
PHARMACY CONSULT FOR:  Risk Assessment for Post-Discharge VTE Following Bariatric Surgery  Post-Discharge VTE Risk Assessment: This patient's probability of 30-day post-discharge VTE is increased due to the factors marked:  Sleeve gastrectomy   Liver disorder (transplant, cirrhosis, or nonalcoholic steatohepatitis)   Hx of VTE   Hemorrhage requiring transfusion   GI perforation, leak, or obstruction   ====================================================    Female    Age >/=60 years   X BMI >/=50 kg/m2    CHF    Dyspnea at Rest    Paraplegia   X Non-gastric-band surgery    Operation Time >/=3 hr    Return to OR     Length of Stay >/= 3 d   Hypercoagulable condition   Significant venous stasis   Additional risk factor:  tamoxifen for hx of breast cancer, which typically increases VTE risk by approximately 2 to 3 fold.    Predicted probability of 30-day post-discharge VTE from [place]: 0.27%  Estimated 30-day VTE risk taking into account tamoxifen therapy for hx of breast cancer:  approximately 0.5 to 0.8%   Recommendation for Discharge: Enoxaparin 60 mg [place] q12h x 2 weeks.   Adriana Moon is a 42 y.o. female who underwent  laparoscopic Roux-en-Y gastric bypass on 07/16/22.   Case start: 1100 Case end: 1315   Allergies  Allergen Reactions   Nsaids Hives, Swelling and Anaphylaxis   Prednisone     Lost taste in her mouth, also makes "me crazy & mean"    Dust Mite Extract Cough   Other Cough    pollen   Pollen Extract Cough   Percocet [Oxycodone-Acetaminophen] Nausea Only    Very lightheaded.   Shellfish Allergy Hives and Swelling    Patient Measurements: Height: 4\' 11"  (149.9 cm) Weight: 132.1 kg (291 lb 3.2 oz) IBW/kg (Calculated) : 43.2 Body mass index is 58.82 kg/m.  Recent Labs    07/17/22 0449  WBC 6.5  HGB 11.0*  HCT 33.5*  PLT 296  CREATININE 0.95  MG 2.0  ALBUMIN 3.3*  PROT 6.6  AST 40  ALT 25  ALKPHOS 30*   BILITOT 0.5   Estimated Creatinine Clearance: 97.9 mL/min (by C-G formula based on SCr of 0.95 mg/dL).    Past Medical History:  Diagnosis Date   Angio-edema    Anxiety    manages naturally   Arthritis of both knees    Asthma    Bilateral swelling of feet    Breast cancer (HCC)    twice   Depression    pt. reports that she tends to get down & has had Lexapro in the past but its been taken off her list for now.    Family history of breast cancer    Family history of kidney cancer    Family history of lung cancer    Family history of prostate cancer    GERD (gastroesophageal reflux disease)    IBS (irritable bowel syndrome)    Insulin resistance    Joint pain    Kidney failure    stage 1   Lactose intolerance    Migraines    PCOS (polycystic ovarian syndrome)    Urticaria    Varicose veins of legs      Medications Prior to Admission  Medication Sig Dispense Refill Last Dose   albuterol (VENTOLIN HFA) 108 (90 Base) MCG/ACT inhaler Inhale 2 puffs into the lungs every 6 (six) hours as needed for wheezing or shortness of breath. 8 g  2 Past Week   cetirizine (ZYRTEC) 10 MG tablet Take 1-2 tablets 1-2 times a day as needed 180 tablet 3 Past Week   escitalopram (LEXAPRO) 20 MG tablet Take 1 tablet (20 mg total) by mouth daily. 90 tablet 3 07/15/2022   famotidine (PEPCID) 20 MG tablet Take 20 mg by mouth daily.   07/15/2022   fluconazole (DIFLUCAN) 150 MG tablet TAKE 1 TABLET BY MOUTH ONCE EVERY MONTH BEFORE  PERIOD 3 tablet 0 Past Week   fluticasone (FLONASE) 50 MCG/ACT nasal spray Place 2 sprays into both nostrils daily. (Patient taking differently: Place 2 sprays into both nostrils daily as needed for allergies.) 16 g 6 Past Month   Fluticasone-Umeclidin-Vilant (TRELEGY ELLIPTA) 100-62.5-25 MCG/ACT AEPB Inhale 1 puff into the lungs daily. 1 each 11 07/15/2022   MAGNESIUM PO Take 3 tablets by mouth daily.   Past Week   metFORMIN (GLUCOPHAGE) 500 MG tablet Take 1 tablet (500 mg  total) by mouth 2 (two) times daily with a meal. 180 tablet 1 Past Month   metroNIDAZOLE (FLAGYL) 500 MG tablet Take 1 tablet (500 mg total) by mouth 2 (two) times daily. 14 tablet 0 07/15/2022   omeprazole (PRILOSEC) 40 MG capsule Take 1 capsule (40 mg total) by mouth daily. 90 capsule 0 07/15/2022   tamoxifen (NOLVADEX) 20 MG tablet Take 1 tablet (20 mg total) by mouth daily. 90 tablet 3 Past Week   cyclobenzaprine (FLEXERIL) 10 MG tablet Take 1 tablet by mouth three times daily as needed for muscle spasm (Patient not taking: Reported on 07/11/2022) 90 tablet 0 Not Taking   Spacer/Aero Chamber Mouthpiece MISC 1 each by Does not apply route every 6 (six) hours as needed. 1 each 0    Vitamin D, Ergocalciferol, (DRISDOL) 1.25 MG (50000 UNIT) CAPS capsule Take 1 capsule (50,000 Units total) by mouth every 7 (seven) days. 12 capsule 0        Keely Drennan K 07/17/2022,8:22 AM

## 2022-07-18 ENCOUNTER — Telehealth: Payer: Self-pay

## 2022-07-18 ENCOUNTER — Other Ambulatory Visit: Payer: Self-pay

## 2022-07-18 ENCOUNTER — Other Ambulatory Visit (HOSPITAL_COMMUNITY): Payer: Self-pay

## 2022-07-18 NOTE — Transitions of Care (Post Inpatient/ED Visit) (Signed)
   07/18/2022  Name: Adriana Moon MRN: [id number] DOB: 08/06/1980  Today's TOC FU Call Status: Today's TOC FU Call Status:: Successful TOC FU Call Competed TOC FU Call Complete Date: 07/17/22  Transition Care Management Follow-up Telephone Call Date of Discharge: 07/17/22 Discharge Facility: [facility]) Type of Discharge: Inpatient Admission Primary Inpatient Discharge Diagnosis:: obesity How have you been since you were released from the hospital?: Better Any questions or concerns?: No  Items Reviewed: Did you receive and understand the discharge instructions provided?: Yes Medications obtained and verified?: Yes (Medications Reviewed) Any new allergies since your discharge?: No Dietary orders reviewed?: Yes Do you have support at home?: No  Home Care and Equipment/Supplies: [place] Ordered?: NA Any new equipment or medical supplies ordered?: NA  Functional Questionnaire: Do you need assistance with bathing/showering or dressing?: No Do you need assistance with meal preparation?: No Do you need assistance with eating?: No Do you have difficulty maintaining continence: No Do you need assistance with getting out of bed/getting out of a chair/moving?: No Do you have difficulty managing or taking your medications?: No  Follow up appointments reviewed: PCP Follow-up appointment confirmed?: [facility] Follow-up appointment confirmed?: Yes Date of Specialist follow-up appointment?: 08/07/22 Follow-Up Specialty Provider:: Dr Kae Heller Do you need transportation to your follow-up appointment?: No Do you understand care options if your condition(s) worsen?: Yes-patient verbalized understanding    [place], [place] Nurse Health Advisor Direct Dial [phone]

## 2022-07-20 ENCOUNTER — Telehealth: Payer: Self-pay | Admitting: Adult Health

## 2022-07-20 ENCOUNTER — Telehealth: Payer: Self-pay

## 2022-07-20 ENCOUNTER — Other Ambulatory Visit (HOSPITAL_COMMUNITY): Payer: Self-pay

## 2022-07-20 NOTE — Telephone Encounter (Signed)
Scheduled appointment per staff message. Unable to leave a voicemail due to the mailbox being full.

## 2022-07-20 NOTE — Telephone Encounter (Signed)
Called Pt and father regarding rescheduling missed appt on 07/12/22, both voicemails full so unable to leave messages. Spoke with sister, Phineas Real, and asked if she can have Pt call us. Latoya verbalized understanding, call back number given.

## 2022-07-23 ENCOUNTER — Telehealth (HOSPITAL_COMMUNITY): Payer: Self-pay | Admitting: *Deleted

## 2022-07-23 NOTE — Telephone Encounter (Signed)
1. Tell me about your pain and pain management?   Pt states that s/he has been experiencing some intermittent abdominal cramping while drinking that lasts for a few seconds and then "goes away". Pt can tolerate protein shakes and water. Pt instructed to call [facility] if pain worsens.     2. Let's talk about fluid intake. How much total fluid are you taking in?   Pt states that s/he is getting in at least 50 oz of fluid including protein shakes, bottled water, and popsicles   Pt encouraged to continue to work towards meeting goal. Pt instructed to assess status and suggestions daily utilizing Hydration Action Plan on discharge folder and to call [facility] if in the "red zone".     3. How much protein have you taken in the last day?    Pt states she is meeting the goal of 60g of protein each day with the protein shakes     4. Have you had nausea? Tell me about when you have experienced nausea and what you did to help?   Pt denies nausea.   5. Has the frequency or color changed with your urine?   Pt states that s/he is urinating "fine" with no changes in frequency or urgency.   6. Tell me what your incisions look like?   "Incisions look fine". Pt reported that she did have chills on 07/18/22 but that they went away. Pt states incisions are not swollen, open, or draining. Pt encouraged to call [facility] if incisions change.  Pt also encouraged to notify [facility] since she did have chills  7. Have you been passing gas? BM?   Pt states that they are having BMs.  Pt instructed to take either Miralax or MoM as instructed per "Gastric Bypass/Sleeve Discharge Home Care Instructions". Pt to call surgeon's office if not able to have BM with medication.    8. If a problem or question were to arise who would you call? Do you know contact numbers for [place], [facility], and [facility]?   Pt knows to call [facility] for surgical, [facility] for nutrition, and [facility] for non-urgent questions or concerns. Pt denies dehydration symptoms. Pt can  describe s/sx of dehydration.   9. How has the walking going?   Pt states s/he is walking around and able to be active without difficulty.   10. Are you still using your incentive spirometer? If so, how often?   Pt states that s/he had not been doing the I.S. Pt encouraged to use incentive spirometer, at least 10x every hour while awake until s/he sees the surgeon.   11. How are your vitamins and calcium going? How are you taking them?    Pt states that s/he is taking his/her supplements and vitamins without difficulty.   12. How has the anticoagulant Lovenox been going?   Patient stated she had a bad headache towards the front of her head on either 03/27 or 03/28 (she couldn't recall) and that she stopped taking the medicine for 1 day, but she resumed taking it the next day. The headache has since gone. Reinforced education about taking injections q12h and rotating injection sites.  Reminded patient that the first 30 days post-operatively are important for successful recovery.

## 2022-07-31 ENCOUNTER — Encounter: Payer: Self-pay | Admitting: Dietician

## 2022-07-31 ENCOUNTER — Encounter: Payer: BLUE CROSS/BLUE SHIELD | Attending: Surgery | Admitting: Dietician

## 2022-07-31 VITALS — Ht 60.0 in | Wt 271.3 lb

## 2022-07-31 DIAGNOSIS — N189 Chronic kidney disease, unspecified: Secondary | ICD-10-CM | POA: Insufficient documentation

## 2022-07-31 DIAGNOSIS — E669 Obesity, unspecified: Secondary | ICD-10-CM | POA: Insufficient documentation

## 2022-07-31 NOTE — Progress Notes (Signed)
2 Week Post-Operative Nutrition Class   Patient was seen on 07/31/2022 for Post-Operative Nutrition education at the [facility].    Surgery date: 07/16/2022 Surgery type: RYGB  Anthropometrics  Start weight at [facility]: 277.8 lbs (date: 02/02/2021)  Height: 60 in Weight today: 271.3 lbs BMI: 52.98 kg/m2     Clinical  Medical hx: GERD, PCOS, IBS. Chronic kidney disease  Medications: see list  Labs: 1.23, GFR 58, vtimain D 45.8 Notable signs/symptoms: increased bowel habits with anxiety  Any previous deficiencies? Yes, vitamin D, iron Bowel Habits: Every day to every other day no complaints   Body Composition Scale 07/31/2022  Current Body Weight 271.3  Total Body Fat % 49.7  Visceral Fat 21  Fat-Free Mass % 50.2   Total Body Water % 39.6  Muscle-Mass lbs 28.6  BMI 55.0  Body Fat Displacement          Torso  lbs 83.7         Left Leg  lbs 16.7         Right Leg  lbs 16.7         Left Arm  lbs 8.3         Right Arm  lbs 8.3      The following the learning objectives were met by the patient during this course: Identifies Soft Prepped Plan Advancement Guide  Identifies Soft, High Proteins (Phase 1), beginning 2 weeks post-operatively to 3 weeks post-operatively Identifies Additional Soft High Proteins, soft non-starchy vegetables, fruits and starches (Phase 2), beginning 3 weeks post-operatively to 3 months post-operatively Identifies appropriate sources of fluids, proteins, vegetables, fruits and starches Identifies appropriate fat sources and healthy verses unhealthy fat types   States protein, vegetable, fruit and starch recommendations and appropriate sources post-operatively Identifies the need for appropriate texture modifications, mastication, and bite sizes when consuming solids Identifies appropriate fat consumption and sources Identifies appropriate multivitamin and calcium sources post-operatively Describes the need for physical  activity post-operatively and will follow MD recommendations States when to call healthcare provider regarding medication questions or post-operative complications   Handouts given during class include: Soft Prepped Plan Advancement Guide   Follow-Up Plan: Patient will follow-up at [facility] in 10 weeks for 3 month post-op nutrition visit for diet advancement per MD.

## 2022-08-09 ENCOUNTER — Telehealth: Payer: Self-pay | Admitting: Skilled Nursing Facility1

## 2022-08-09 NOTE — Telephone Encounter (Signed)
RD called pt to verify fluid intake once starting soft, solid proteins 2 week post-bariatric surgery.   Daily Fluid intake:  Daily Protein intake: Bowel Habits:   Concerns/issues:    LVM 

## 2022-08-21 ENCOUNTER — Inpatient Hospital Stay: Payer: BLUE CROSS/BLUE SHIELD | Attending: Hematology and Oncology | Admitting: Adult Health

## 2022-10-02 ENCOUNTER — Telehealth: Payer: Self-pay | Admitting: Adult Health

## 2022-10-02 NOTE — Telephone Encounter (Signed)
Patient is aware of scheduled appointment times/dates

## 2022-10-23 ENCOUNTER — Inpatient Hospital Stay: Payer: BLUE CROSS/BLUE SHIELD | Attending: Hematology and Oncology | Admitting: Adult Health

## 2022-12-11 ENCOUNTER — Ambulatory Visit: Payer: BLUE CROSS/BLUE SHIELD

## 2022-12-12 ENCOUNTER — Other Ambulatory Visit (HOSPITAL_COMMUNITY)
Admission: RE | Admit: 2022-12-12 | Discharge: 2022-12-12 | Disposition: A | Discharge status: Home / Self Care | Payer: BLUE CROSS/BLUE SHIELD | Source: Ambulatory Visit | Attending: Advanced Practice Midwife | Admitting: Advanced Practice Midwife

## 2022-12-12 ENCOUNTER — Encounter: Payer: Self-pay | Admitting: Advanced Practice Midwife

## 2022-12-12 ENCOUNTER — Ambulatory Visit (INDEPENDENT_AMBULATORY_CARE_PROVIDER_SITE_OTHER): Payer: BLUE CROSS/BLUE SHIELD | Admitting: Advanced Practice Midwife

## 2022-12-12 VITALS — BP 108/75 | HR 71 | Ht 59.0 in | Wt 241.0 lb

## 2022-12-12 DIAGNOSIS — N898 Other specified noninflammatory disorders of vagina: Secondary | ICD-10-CM | POA: Insufficient documentation

## 2022-12-12 MED ORDER — FLUCONAZOLE 150 MG PO TABS: 150.0000 mg | ORAL_TABLET | ORAL | 0 refills | Status: DC

## 2022-12-12 NOTE — Progress Notes (Signed)
GYN VISIT Patient name: Adriana Moon MRN [id number]  Date of birth: 1980/05/23 Chief Complaint:   Vaginal Itching (Odor and white discharge)  History of Present Illness:   Adriana Moon is a 42 y.o. [id number] African-American female being seen today for occ vag itch; creamy white d/c; slight odor. Declines trich, GC/chlam testing.   Patient's last menstrual period was 11/12/2022 (approximate). The current method of family planning is  s/p ablation; Phexxi .  Last pap Jan 2024. Results were: NILM w/ HRHPV negative     06/19/2022   11:13 AM 02/02/2021    1:55 PM 07/10/2019   10:01 AM 06/12/2019    3:51 PM 04/28/2019   11:02 AM  Depression screen PHQ 2/9  Decreased Interest 0 0 0 1 2  Down, Depressed, Hopeless 0 0 0 1 2  PHQ - 2 Score 0 0 0 2 4  Altered sleeping     1  Tired, decreased energy     2  Change in appetite     2  Feeling bad or failure about yourself      2  Trouble concentrating     1  Moving slowly or fidgety/restless     0  Suicidal thoughts     0  PHQ-9 Score     12  Difficult doing work/chores     Not difficult at all        06/12/2019    3:50 PM 09/21/2016    3:20 PM  GAD 7 : Generalized Anxiety Score  Nervous, Anxious, on Edge 1 3  Control/stop worrying 1 3  Worry too much - different things 1 3  Trouble relaxing 0 2  Restless 0 2  Easily annoyed or irritable 0 2  Afraid - awful might happen 0 3  Total GAD 7 Score 3 18  Anxiety Difficulty  Very difficult     Review of Systems:   Pertinent items are noted in HPI Denies fever/chills, dizziness, headaches, visual disturbances, fatigue, shortness of breath, chest pain, abdominal pain, vomiting, abnormal vaginal discharge/itching/odor/irritation, problems with periods, bowel movements, urination, or intercourse unless otherwise stated above.  Pertinent History Reviewed:  Reviewed past medical,surgical, social, obstetrical and family history.  Reviewed problem list, medications  and allergies. Physical Assessment:   Vitals:   12/12/22 1039  BP: 108/75  Pulse: 71  Weight: 241 lb (109.3 kg)  Height: 4\' 11"  (1.499 m)  Body mass index is 48.68 kg/m.       Physical Examination:   General appearance: alert, well appearing, and in no distress  Mental status: alert, oriented to person, place, and time  Skin: warm & dry   Cardiovascular: normal heart rate noted  Respiratory: normal respiratory effort, no distress  Abdomen: soft, non-tender   Pelvic: normal external genitalia, vulva, vagina Extremities: no edema    No results found for this or any previous visit (from the past 24 hour(s)).  Assessment & Plan:  1) Vaginal d/c> requesting CV swab with yeast and BV only; also requests prn dosing of Diflucan for intermittent vag itching   Meds:  Meds ordered this encounter  Medications   fluconazole (DIFLUCAN) 150 MG tablet    Sig: Take 1 tablet (150 mg total) by mouth every 28 (twenty-eight) days. Take before period starts    Dispense:  4 tablet    Refill:  0    Order Specific Question:   Supervising Provider    Answer:   Milas Hock [[id number]]  No orders of the defined types were placed in this encounter.   Return for prn.  Arabella Merles CNM 12/12/2022 11:09 AM

## 2022-12-13 ENCOUNTER — Telehealth: Payer: Self-pay | Admitting: Hematology and Oncology

## 2022-12-13 NOTE — Telephone Encounter (Signed)
Scheduled appointment per patients request. Patient is aware of the made appointment. 

## 2022-12-14 LAB — CERVICOVAGINAL ANCILLARY ONLY
Bacterial Vaginitis (gardnerella): POSITIVE — AB
Candida Glabrata: NEGATIVE
Candida Vaginitis: NEGATIVE
Comment: NEGATIVE
Comment: NEGATIVE
Comment: NEGATIVE

## 2022-12-17 ENCOUNTER — Other Ambulatory Visit: Payer: Self-pay | Admitting: Advanced Practice Midwife

## 2022-12-17 ENCOUNTER — Telehealth: Payer: Self-pay

## 2022-12-17 MED ORDER — METRONIDAZOLE 500 MG PO TABS: 500.0000 mg | ORAL_TABLET | Freq: Two times a day (BID) | ORAL | 0 refills | Status: DC

## 2022-12-17 NOTE — Progress Notes (Unsigned)
f °

## 2022-12-17 NOTE — Telephone Encounter (Signed)
Patient stated that she received her test results and would like something called in.

## 2022-12-21 ENCOUNTER — Inpatient Hospital Stay: Payer: BLUE CROSS/BLUE SHIELD | Attending: Hematology and Oncology | Admitting: Adult Health

## 2022-12-21 ENCOUNTER — Encounter: Payer: Self-pay | Admitting: Adult Health

## 2022-12-21 VITALS — BP 104/63 | HR 60 | Temp 97.9°F | Resp 18 | Ht 59.0 in | Wt 242.3 lb

## 2022-12-21 DIAGNOSIS — C50411 Malignant neoplasm of upper-outer quadrant of right female breast: Secondary | ICD-10-CM | POA: Insufficient documentation

## 2022-12-21 DIAGNOSIS — Z9013 Acquired absence of bilateral breasts and nipples: Secondary | ICD-10-CM | POA: Insufficient documentation

## 2022-12-21 DIAGNOSIS — Z170 Estrogen receptor positive status [ER+]: Secondary | ICD-10-CM | POA: Insufficient documentation

## 2022-12-21 DIAGNOSIS — Z79810 Long term (current) use of selective estrogen receptor modulators (SERMs): Secondary | ICD-10-CM | POA: Diagnosis not present

## 2022-12-21 MED ORDER — TAMOXIFEN CITRATE 10 MG PO TABS: 10.0000 mg | ORAL_TABLET | Freq: Two times a day (BID) | ORAL | 3 refills | Status: DC

## 2022-12-21 NOTE — Assessment & Plan Note (Signed)
12/22/2018: Bilateral mastectomies:benign left breast, and in the right breast, invasive ductal carcinoma with DCIS, 3cm, clear margins, and one negative right axillary lymph node.    Oncotype DX score 18: Distant recurrence at 9 years: 5%   Current treatment: Adjuvant antiestrogen therapy with tamoxifen started 01/29/2019 stopped for 3 months and resumed January 2022 Tamoxifen toxicities: Increased vaginal discharge.  I recommended that she get a feminine cleanser to use daily and after intercourse help keep the pH balanced which can help prevent infections such as BV and yeast.  We recommended that she stay on the tamoxifen through 2030.  I discussed risks and benefits in doing so.   Breast cancer surveillance: 1.  Chest exam 11/2022: Benign  Health maintenance: I counseled her on her diet and activity.  We reviewed that women who exercise at 150 to 300 minutes a week have a lower rate of breast cancer recurrence.  We also discussed healthy diet and improved health outcomes in patients who are eating well and exercising regularly.  We discussed the goal that on most days that she is not working that she will get 5000 steps.   Location will return in 6 months for follow-up.  She knows to call for any questions or concerns that may arise between now and her next appointment.

## 2022-12-21 NOTE — Progress Notes (Signed)
[name] Cancer Follow up:    [name], MD [address] [place] [address]   DIAGNOSIS:  Cancer Staging  Malignant neoplasm of upper-outer quadrant of right breast in female, estrogen receptor positive (HCC) Staging form: Breast, AJCC 8th Edition - Clinical stage from 11/03/2018: Stage IA (cT1b, cN0, cM0, G2, ER+, PR+, HER2-) - Signed by Loa Socks, NP on 11/12/2018 Histologic grading system: 3 grade system - Pathologic stage from 12/31/2018: Stage IA (pT2, pN0, cM0, G2, ER+, PR+, HER2-) - Signed by Serena Croissant, MD on 12/31/2018 Histologic grading system: 3 grade system   SUMMARY OF ONCOLOGIC HISTORY: Oncology History  Breast cancer of lower-outer quadrant of right female breast (HCC)  06/17/2007 Surgery   right lumpectomy: Invasive ductal carcinoma 1.8 cm with abundant extracellular mucin 1.8 cm, grade 2, lymphovascular invasion present, margins negative, 7 lymph nodes negative, T1 cN0 stage IA   07/15/2007 - 08/27/2007 Radiation Therapy   adjuvant radiation therapy   09/25/2007 - 09/24/2009 Anti-estrogen oral therapy   Tamoxifen with Zoladex for 2 years patient stopped it because she felt foggy in the head   01/25/2015 Surgery   breast reduction surgery: Right breast atypical ductal hyperplasia microscopic focus left breast benign   02/25/2015 -  Anti-estrogen oral therapy   tamoxifen 20 mg daily restarted   11/03/2018 Relapse/Recurrence   Patient palpated right breast changes. Mammogram showed 4 masses in the right breast measuring 1.0cm at 2 o'clock, 0.8cm at 10 o'clock, and 0.7cm and 0.5cm at 9:30, with no right axillary adenopathy. Biopsy confirmed IDC, grade 2, HER-2 - (1+), ER +100%, PR+ 100%, Ki67 30%.    11/28/2018 Genetic Testing   Negative genetic testing results on the [facility] Common Hereditary Cancers panel. 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  The Common Hereditary Gene Panel  offered by [facility] includes sequencing and/or deletion duplication testing of the following 48 genes: APC, ATM, AXIN2, BARD1, BMPR1A, BRCA1, BRCA2, BRIP1, CDH1, CDK4, CDKN2A (p14ARF), CDKN2A (p16INK4a), CHEK2, CTNNA1, DICER1, EPCAM (Deletion/duplication testing only), GREM1 (promoter region deletion/duplication testing only), KIT, MEN1, MLH1, MSH2, MSH3, MSH6, MUTYH, NBN, NF1, NHTL1, PALB2, PDGFRA, PMS2, POLD1, POLE, PTEN, RAD50, RAD51C, RAD51D, RNF43, SDHB, SDHC, SDHD, SMAD4, SMARCA4. STK11, TP53, TSC1, TSC2, and VHL.  The following genes were evaluated for sequence changes only: SDHA and HOXB13 c.251G>A variant only.    12/22/2018 Surgery   Bilateral mastectomies [name]: left breast: benign; right breast: IDC with DCIS, 3cm, clear margins, and one negative right axillary lymph node.    01/23/2019 Oncotype testing   The Oncotype DX score was 18 predicting a risk of outside the breast recurrence over the next 9 years of 5% if the patient's only systemic therapy is tamoxifen for 5 years.     Malignant neoplasm of upper-outer quadrant of right breast in female, estrogen receptor positive (HCC)  11/03/2018 Cancer Staging   Staging form: Breast, AJCC 8th Edition - Clinical stage from 11/03/2018: Stage IA (cT1b, cN0, cM0, G2, ER+, PR+, HER2-) - Signed by Loa Socks, NP on 11/12/2018   11/03/2018 Initial Diagnosis   Palpable abnormality in the right breast status post right lower breast cancer in 2009 and bilateral reduction mammoplasty in 2016, ultrasound revealed 1 cm lobulated hypoechoic mass, additional masses 8 mm, 7 mm and 5 mm were noted.  Biopsy ([id number]) right breast 10 o'clock position: IDC grade 2, ER 100%, PR 5%, Ki-67 30%, HER-2 -1+: Biopsy 2 o'clock position: Fat  necrosis   11/28/2018 Genetic Testing   Negative genetic testing results on the [facility] Common Hereditary Cancers panel. 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  The  Common Hereditary Gene Panel offered by [facility] includes sequencing and/or deletion duplication testing of the following 48 genes: APC, ATM, AXIN2, BARD1, BMPR1A, BRCA1, BRCA2, BRIP1, CDH1, CDK4, CDKN2A (p14ARF), CDKN2A (p16INK4a), CHEK2, CTNNA1, DICER1, EPCAM (Deletion/duplication testing only), GREM1 (promoter region deletion/duplication testing only), KIT, MEN1, MLH1, MSH2, MSH3, MSH6, MUTYH, NBN, NF1, NHTL1, PALB2, PDGFRA, PMS2, POLD1, POLE, PTEN, RAD50, RAD51C, RAD51D, RNF43, SDHB, SDHC, SDHD, SMAD4, SMARCA4. STK11, TP53, TSC1, TSC2, and VHL.  The following genes were evaluated for sequence changes only: SDHA and HOXB13 c.251G>A variant only.    12/22/2018 Surgery   Bilateral mastectomies [name] [phone]): Benign left breast, and in the right breast, invasive ductal carcinoma with DCIS, 3cm, clear margins, and one negative right axillary lymph node.    12/31/2018 Cancer Staging   Staging form: Breast, AJCC 8th Edition - Pathologic stage from 12/31/2018: Stage IA (pT2, pN0, cM0, G2, ER+, PR+, HER2-) - Signed by Serena Croissant, MD on 12/31/2018   01/23/2019 Oncotype testing   The Oncotype DX score was 18 predicting a risk of outside the breast recurrence over the next 9 years of 5% if the patient's only systemic therapy is tamoxifen for 5 years.     01/2019 - 01/2029 Anti-estrogen oral therapy   Tamoxifen 20 mg   04/06/2019 Surgery   Reconstruction ([place])     CURRENT THERAPY:  INTERVAL HISTORY: Adriana Moon 42 y.o. female returns for follow-up of her breast cancer.  She has been out of Tamoxifen and cannot recall what happened when she ran out.  She has felt weird on it before.   She had bypass surgery and lost about 60 pounds.  She works about 6 months out of the year and when she is at work she is walking 18,000 steps a day.  On the day she is not working she does not get as many steps in.  She continues to eat healthy and is working on continued weight loss.  She  notes that her back and knees are feeling much better already.   Patient Active Problem List   Diagnosis Date Noted   Morbid obesity (HCC) 07/16/2022   Missed period 05/17/2022   History of tamoxifen therapy 05/17/2022   Encounter for other contraceptive management 04/11/2021   Fatigue 12/27/2020   Class 3 severe obesity with serious comorbidity and body mass index (BMI) of 50.0 to 59.9 in adult (HCC) 11/15/2020   Cloudy urine 04/28/2020   Vaginal dryness 04/28/2020   Menopausal vasomotor syndrome, due to Tamoxifen 06/08/2019   Anxiety and depression 06/08/2019   Vitamin D deficiency 06/08/2019   Insulin resistance 06/08/2019   S/P breast reconstruction, bilateral 05/05/2019   Gastroesophageal reflux disease without esophagitis 04/08/2019   Acquired absence of breast 01/13/2019   Breast cancer (HCC) 12/22/2018   Genetic testing 12/01/2018   Malignant neoplasm of upper-outer quadrant of right breast in female, estrogen receptor positive (HCC) 11/12/2018   Family history of breast cancer    Family history of prostate cancer    Family history of lung cancer    Family history of kidney cancer    Mass of upper inner quadrant of right breast 10/22/2018   History of breast cancer 07/09/2018   BMI 45.0-49.9, adult (HCC) 05/22/2015   Chronic kidney disease 05/22/2015   Breast cancer of  lower-outer quadrant of right female breast (HCC) 02/25/2015    is allergic to nsaids, prednisone, dust mite extract, other, pollen extract, percocet [oxycodone-acetaminophen], and shellfish allergy.  MEDICAL HISTORY: Past Medical History:  Diagnosis Date   Angio-edema    Anxiety    manages naturally   Arthritis of both knees    Asthma    Bilateral swelling of feet    Breast cancer (HCC)    twice   Depression    pt. reports that she tends to get down & has had Lexapro in the past but its been taken off her list for now.    Family history of breast cancer    Family history of kidney cancer     Family history of lung cancer    Family history of prostate cancer    GERD (gastroesophageal reflux disease)    IBS (irritable bowel syndrome)    Insulin resistance    Joint pain    Kidney failure    stage 1   Lactose intolerance    Migraines    PCOS (polycystic ovarian syndrome)    Urticaria    Varicose veins of legs     SURGICAL HISTORY: Past Surgical History:  Procedure Laterality Date   BREAST LUMPECTOMY Right 2009   BREAST RECONSTRUCTION WITH PLACEMENT OF TISSUE EXPANDER AND FLEX HD (ACELLULAR HYDRATED DERMIS) Bilateral 12/22/2018   Procedure: BILATERAL BREAST RECONSTRUCTION WITH PLACEMENT OF TISSUE EXPANDER AND FLEX HD (ACELLULAR HYDRATED DERMIS);  Surgeon: Peggye Form, DO;  Location: [facility];  Service: Plastics;  Laterality: Bilateral;   BREAST REDUCTION SURGERY Bilateral 01/25/2015   Procedure: MAMMARY REDUCTION  (BREAST);  Surgeon: Eloise Levels, MD;  Location: [facility];  Service: Plastics;  Laterality: Bilateral;   CESAREAN SECTION     x2  2002 & 2006   ECTOPIC PREGNANCY SURGERY  06/2018   R side , done at [facility]    ENDOMETRIAL ABLATION     GASTRIC ROUX-EN-Y N/A 07/16/2022   Procedure: LAPAROSCOPIC ROUX-EN-Y GASTRIC BYPASS WITH UPPER ENDOSCOPY;  Surgeon: Berna Bue, MD;  Location: [facility];  Service: General;  Laterality: N/A;   MASTECTOMY W/ SENTINEL NODE BIOPSY Bilateral 12/22/2018   with tissue expanders   MASTECTOMY W/ SENTINEL NODE BIOPSY Bilateral 12/22/2018   Procedure: BILATERAL MASTECTOMIES WITH RIGHT SENTINEL LYMPH NODE BIOPSY;  Surgeon: Abigail Miyamoto, MD;  Location: [facility];  Service: General;  Laterality: Bilateral;   PLANTAR FASCIA SURGERY Bilateral    REDUCTION MAMMAPLASTY Left    REMOVAL OF BILATERAL TISSUE EXPANDERS WITH PLACEMENT OF BILATERAL BREAST IMPLANTS Bilateral 04/06/2019   Procedure: REMOVAL OF BILATERAL TISSUE EXPANDERS WITH PLACEMENT OF BILATERAL BREAST IMPLANTS;  Surgeon: Peggye Form, DO;   Location: [facility];  Service: Plastics;  Laterality: Bilateral;   RT lumpectomy     TONSILLECTOMY AND ADENOIDECTOMY N/A 10/11/2020   Procedure: TONSILLECTOMY AND ADENOIDECTOMY;  Surgeon: Newman Pies, MD;  Location: [facility];  Service: ENT;  Laterality: N/A;    SOCIAL HISTORY: Social History   Socioeconomic History   Marital status: Divorced    Spouse name: Not on file   Number of children: 2   Years of education: Not on file   Highest education level: Not on file  Occupational History   Occupation: [employer]  Tobacco Use   Smoking status: Never   Smokeless tobacco: Never  Vaping Use   Vaping status: Never Used  Substance and Sexual Activity   Alcohol use: Yes    Alcohol/week: 0.0  standard drinks of alcohol    Comment: occ. - socially   Drug use: No   Sexual activity: Yes    Birth control/protection: Surgical    Comment: ablation  Other Topics Concern   Not on file  Social History Narrative   Not on file   Social Determinants of Health   Financial Resource Strain: Not on file  Food Insecurity: No Food Insecurity (07/16/2022)   Hunger Vital Sign    Worried About Running Out of Food in the Last Year: Never true    Ran Out of Food in the Last Year: Never true  Transportation Needs: No Transportation Needs (07/16/2022)   PRAPARE - Administrator, Civil Service (Medical): No    Lack of Transportation (Non-Medical): No  Physical Activity: Not on file  Stress: No Stress Concern Present (01/16/2019)   Received from [facility], [facility]   [facility] - Occupational Stress Questionnaire    Feeling of Stress : Not at all  Social Connections: Not on file  Intimate Partner Violence: Not At Risk (07/16/2022)   Humiliation, Afraid, Rape, and Kick questionnaire    Fear of Current or Ex-Partner: No    Emotionally Abused: No    Physically Abused: No    Sexually Abused: No    FAMILY HISTORY: Family History  Problem Relation Age of  Onset   Breast cancer Paternal Grandmother        late 79s   Diabetes Maternal Grandmother    Kidney disease Maternal Grandmother    Emphysema Maternal Grandfather    Lung cancer Maternal Grandfather        45s   Diabetes Father    Hypertension Father    Gout Father    Obesity Father    Sleep apnea Father    Colon polyps Father    Diverticulitis Father    Breast cancer Mother    Esophageal cancer Son    Diabetes Maternal Aunt    Kidney disease Maternal Aunt    Prostate cancer Maternal Uncle        late 95s   Kidney cancer Other 5       4th degree paternal relative   Colon cancer Other    Rectal cancer Neg Hx    Stomach cancer Neg Hx     Review of Systems  Constitutional:  Negative for appetite change, chills, fatigue, fever and unexpected weight change.  HENT:   Negative for hearing loss, lump/mass and trouble swallowing.   Eyes:  Negative for eye problems and icterus.  Respiratory:  Negative for chest tightness, cough and shortness of breath.   Cardiovascular:  Negative for chest pain, leg swelling and palpitations.  Gastrointestinal:  Negative for abdominal distention, abdominal pain, constipation, diarrhea, nausea and vomiting.  Endocrine: Negative for hot flashes.  Genitourinary:  Negative for difficulty urinating.   Musculoskeletal:  Negative for arthralgias.  Skin:  Negative for itching and rash.  Neurological:  Negative for dizziness, extremity weakness, headaches and numbness.  Hematological:  Negative for adenopathy. Does not bruise/bleed easily.  Psychiatric/Behavioral:  Negative for depression. The patient is not nervous/anxious.       PHYSICAL EXAMINATION    Vitals:   12/21/22 0843  BP: 104/63  Pulse: 60  Resp: 18  Temp: 97.9 F (36.6 C)  SpO2: 100%    Physical Exam Constitutional:      General: She is not in acute distress.    Appearance: Normal appearance. She is  not toxic-appearing.  HENT:     Head: Normocephalic and atraumatic.      Mouth/Throat:     Mouth: Mucous membranes are moist.     Pharynx: Oropharynx is clear. No oropharyngeal exudate or posterior oropharyngeal erythema.  Eyes:     General: No scleral icterus. Cardiovascular:     Rate and Rhythm: Normal rate and regular rhythm.     Pulses: Normal pulses.     Heart sounds: Normal heart sounds.  Pulmonary:     Effort: Pulmonary effort is normal.     Breath sounds: Normal breath sounds.  Chest:     Comments: Status post bilateral mastectomies with reconstruction.  No sign of local recurrence. Abdominal:     General: Abdomen is flat. Bowel sounds are normal. There is no distension.     Palpations: Abdomen is soft.     Tenderness: There is no abdominal tenderness.  Musculoskeletal:        General: No swelling.     Cervical back: Neck supple.  Lymphadenopathy:     Cervical: No cervical adenopathy.  Skin:    General: Skin is warm and dry.     Findings: No rash.  Neurological:     General: No focal deficit present.     Mental Status: She is alert.  Psychiatric:        Mood and Affect: Mood normal.        Behavior: Behavior normal.     LABORATORY DATA:  CBC    Component Value Date/Time   WBC 6.5 07/17/2022 0449   RBC 4.01 07/17/2022 0449   HGB 11.0 (L) 07/17/2022 0449   HGB 12.5 04/28/2019 1248   HGB 12.5 03/10/2010 0959   HCT 33.5 (L) 07/17/2022 0449   HCT 37.8 04/28/2019 1248   HCT 36.4 03/10/2010 0959   PLT 296 07/17/2022 0449   PLT 338 04/28/2019 1248   MCV 83.5 07/17/2022 0449   MCV 86 04/28/2019 1248   MCV 82.5 03/10/2010 0959   MCH 27.4 07/17/2022 0449   MCHC 32.8 07/17/2022 0449   RDW 13.2 07/17/2022 0449   RDW 13.2 04/28/2019 1248   RDW 13.1 03/10/2010 0959   LYMPHSABS 2.5 07/12/2022 1112   LYMPHSABS 2.9 04/28/2019 1248   LYMPHSABS 1.5 03/10/2010 0959   MONOABS 0.6 07/12/2022 1112   MONOABS 0.3 03/10/2010 0959   EOSABS 0.1 07/12/2022 1112   EOSABS 0.1 04/28/2019 1248   BASOSABS 0.0 07/12/2022 1112   BASOSABS 0.0  04/28/2019 1248   BASOSABS 0.0 03/10/2010 0959    CMP     Component Value Date/Time   NA 138 07/17/2022 0449   NA 143 07/10/2021 0851   K 3.9 07/17/2022 0449   CL 110 07/17/2022 0449   CO2 20 (L) 07/17/2022 0449   GLUCOSE 98 07/17/2022 0449   BUN 11 07/17/2022 0449   BUN 10 07/10/2021 0851   CREATININE 0.95 07/17/2022 0449   CALCIUM 8.4 (L) 07/17/2022 0449   PROT 6.6 07/17/2022 0449   PROT 6.8 07/10/2021 0851   ALBUMIN 3.3 (L) 07/17/2022 0449   ALBUMIN 4.1 07/10/2021 0851   AST 40 07/17/2022 0449   ALT 25 07/17/2022 0449   ALKPHOS 30 (L) 07/17/2022 0449   BILITOT 0.5 07/17/2022 0449   BILITOT 0.3 07/10/2021 0851   GFRNONAA >60 07/17/2022 0449   GFRAA 78 04/28/2019 1248      ASSESSMENT and THERAPY PLAN:   Malignant neoplasm of upper-outer quadrant of right breast in female, estrogen receptor positive (HCC) 12/22/2018: Bilateral  mastectomies:benign left breast, and in the right breast, invasive ductal carcinoma with DCIS, 3cm, clear margins, and one negative right axillary lymph node.    Oncotype DX score 18: Distant recurrence at 9 years: 5%   Current treatment: Adjuvant antiestrogen therapy with tamoxifen started 01/29/2019 stopped for 3 months and resumed January 2022 Tamoxifen toxicities: Increased vaginal discharge.  I recommended that she get a feminine cleanser to use daily and after intercourse help keep the pH balanced which can help prevent infections such as BV and yeast.  We recommended that she stay on the tamoxifen through 2030.  I discussed risks and benefits in doing so.   Breast cancer surveillance: 1.  Chest exam 11/2022: Benign  Health maintenance: I counseled her on her diet and activity.  We reviewed that women who exercise at 150 to 300 minutes a week have a lower rate of breast cancer recurrence.  We also discussed healthy diet and improved health outcomes in patients who are eating well and exercising regularly.  We discussed the goal that on most  days that she is not working that she will get 5000 steps.   Location will return in 6 months for follow-up.  She knows to call for any questions or concerns that may arise between now and her next appointment.   All questions were answered. The patient knows to call the clinic with any problems, questions or concerns. We can certainly see the patient much sooner if necessary.  Total encounter time:30 minutes*in face-to-face visit time, chart review, lab review, care coordination, order entry, and documentation of the encounter time.    Lillard Anes, NP 12/21/22 9:13 AM Medical Oncology and Hematology [facility] [address] [place], [place] [address] Tel. [phone]    Fax. [phone]  *Total Encounter Time as defined by the Centers for Medicare and Medicaid Services includes, in addition to the face-to-face time of a patient visit (documented in the note above) non-face-to-face time: obtaining and reviewing outside history, ordering and reviewing medications, tests or procedures, care coordination (communications with other health care professionals or caregivers) and documentation in the medical record.

## 2023-02-26 ENCOUNTER — Other Ambulatory Visit (HOSPITAL_COMMUNITY)
Admission: RE | Admit: 2023-02-26 | Discharge: 2023-02-26 | Disposition: A | Discharge status: Home / Self Care | Payer: BLUE CROSS/BLUE SHIELD | Source: Ambulatory Visit | Attending: Adult Health | Admitting: Adult Health

## 2023-02-26 ENCOUNTER — Ambulatory Visit (INDEPENDENT_AMBULATORY_CARE_PROVIDER_SITE_OTHER): Payer: BLUE CROSS/BLUE SHIELD | Admitting: Adult Health

## 2023-02-26 ENCOUNTER — Encounter: Payer: Self-pay | Admitting: Adult Health

## 2023-02-26 ENCOUNTER — Other Ambulatory Visit: Payer: Self-pay | Admitting: Family Medicine

## 2023-02-26 VITALS — BP 102/76 | HR 57 | Ht 60.0 in | Wt 234.5 lb

## 2023-02-26 DIAGNOSIS — Z9229 Personal history of other drug therapy: Secondary | ICD-10-CM | POA: Diagnosis not present

## 2023-02-26 DIAGNOSIS — Z113 Encounter for screening for infections with a predominantly sexual mode of transmission: Secondary | ICD-10-CM | POA: Diagnosis not present

## 2023-02-26 DIAGNOSIS — J4540 Moderate persistent asthma, uncomplicated: Secondary | ICD-10-CM

## 2023-02-26 DIAGNOSIS — J069 Acute upper respiratory infection, unspecified: Secondary | ICD-10-CM

## 2023-02-26 DIAGNOSIS — N898 Other specified noninflammatory disorders of vagina: Secondary | ICD-10-CM | POA: Insufficient documentation

## 2023-02-26 DIAGNOSIS — Z853 Personal history of malignant neoplasm of breast: Secondary | ICD-10-CM | POA: Diagnosis not present

## 2023-02-26 DIAGNOSIS — R062 Wheezing: Secondary | ICD-10-CM

## 2023-02-26 NOTE — Progress Notes (Signed)
  Subjective:     Patient ID: Adriana Moon, female   DOB: 1981/03/14, 42 y.o.   MRN: [id number]  HPI Adriana Moon is a 42 year old black female, divorced, [id number] in complaining of vaginal discharge with odor. Feels swollen after sex and itchy. She is on tamoxifen hx of breast cancer.     Component Value Date/Time   DIAGPAP  05/17/2022 1107    - Negative for intraepithelial lesion or malignancy (NILM)   DIAGPAP  12/17/2018 0000    NEGATIVE FOR INTRAEPITHELIAL LESIONS OR MALIGNANCY.   DIAGPAP  12/17/2018 0000    FUNGAL ORGANISMS PRESENT CONSISTENT WITH CANDIDA SPP.   HPVHIGH Negative 05/17/2022 1107   ADEQPAP  05/17/2022 1107    Satisfactory for evaluation; transformation zone component ABSENT.   ADEQPAP  12/17/2018 0000    Satisfactory for evaluation  endocervical/transformation zone component PRESENT.   ADEQPAP  04/04/2016 0000    Satisfactory for evaluation  endocervical/transformation zone component PRESENT.     Review of Systems +vaginal discharge with odor Feels swollen after sex and itchy Reviewed past medical,surgical, social and family history. Reviewed medications and allergies.     Objective:   Physical Exam BP 102/76 (BP Location: Left Arm, Patient Position: Sitting, Cuff Size: Normal)   Pulse (!) 57   Ht 5' (1.524 m)   Wt 234 lb 8 oz (106.4 kg)   LMP 02/10/2023 (Approximate)   BMI 45.80 kg/m     Skin warm and dry.Pelvic: external genitalia is normal in appearance no lesions, vagina: white discharge without odor,urethra has no lesions or masses noted, cervix:smooth and bulbous, uterus: normal size, shape and contour, non tender, no masses felt, adnexa: no masses or tenderness noted. Bladder is non tender and no masses felt.  [facility] - 02/26/23 0844       Pregnancy Intention Screening   Does the patient want to become pregnant in the next year? No    Does the patient's partner want to become pregnant in the next year? No    Would the patient like to  discuss contraceptive options today? No      Contraception Wrap Up   Current Method Withdrawal or Other Method   Phexxi gel   End Method Withdrawal or Other Method   Phexxi gel   Contraception Counseling Provided Yes            Examination chaperoned by Malachy Mood LPN  Assessment:     1. Vaginal odor She notices odor CV swab sent Try rephresh - Cervicovaginal ancillary only( [facility])  2. Vaginal discharge Has white creamy discharge CV swab sent  - Cervicovaginal ancillary only( [facility]) Pee before and after sex Use good lubricate  Feels swollen after sex  3. Screening examination for STD (sexually transmitted disease) CV swab sent for GC/CHL,trich,BV and yeast  - Cervicovaginal ancillary only( [facility])  4. History of breast cancer Estrogen receptor +  5. History of tamoxifen therapy    Plan:    Will talk with Dr Despina Hidden about possible vaginal estrogen if symptoms persist, she knows not first choice of therapy with hx breast cancer  Follow up prn

## 2023-02-27 ENCOUNTER — Other Ambulatory Visit: Payer: Self-pay | Admitting: Adult Health

## 2023-02-27 LAB — CERVICOVAGINAL ANCILLARY ONLY
Bacterial Vaginitis (gardnerella): POSITIVE — AB
Candida Glabrata: NEGATIVE
Candida Vaginitis: POSITIVE — AB
Chlamydia: NEGATIVE
Comment: NEGATIVE
Comment: NEGATIVE
Comment: NEGATIVE
Comment: NEGATIVE
Comment: NEGATIVE
Comment: NORMAL
Neisseria Gonorrhea: NEGATIVE
Trichomonas: NEGATIVE

## 2023-02-27 MED ORDER — FLUCONAZOLE 150 MG PO TABS: ORAL_TABLET | ORAL | 1 refills | Status: DC

## 2023-02-27 MED ORDER — METRONIDAZOLE 500 MG PO TABS: 500.0000 mg | ORAL_TABLET | Freq: Two times a day (BID) | ORAL | 0 refills | Status: DC

## 2023-02-27 NOTE — Progress Notes (Signed)
+  BV and yeast on vaginal swab will rx flagyl and diflucan, no sex or alcohol while taking meds.  

## 2023-03-01 ENCOUNTER — Encounter: Payer: BLUE CROSS/BLUE SHIELD | Attending: Surgery | Admitting: Dietician

## 2023-03-01 ENCOUNTER — Encounter: Payer: Self-pay | Admitting: Dietician

## 2023-03-01 VITALS — Ht 60.0 in | Wt 232.6 lb

## 2023-03-01 DIAGNOSIS — E669 Obesity, unspecified: Secondary | ICD-10-CM | POA: Diagnosis present

## 2023-03-01 NOTE — Progress Notes (Signed)
Bariatric Nutrition Follow-Up Visit Medical Nutrition Therapy  Appt Start Time: 8:48   End Time: 9:27  Surgery date: 07/16/2022 Surgery type: RYGB  NUTRITION ASSESSMENT  Anthropometrics  Start weight at [facility]: 277.8 lbs (date: 02/02/2021)  Height: 60 in Weight today: 232.6 lbs   Clinical  Medical hx: GERD, PCOS, IBS. Chronic kidney disease  Medications: see list  Labs: 1.23, GFR 58, vtimain D 45.8 Notable signs/symptoms: increased bowel habits with anxiety  Any previous deficiencies? Yes, vitamin D, iron Bowel Habits: Every day to every other day no complaints   Body Composition Scale 07/31/2022 03/01/2023  Current Body Weight 271.3 232.6  Total Body Fat % 49.7 45.7  Visceral Fat 21 16  Fat-Free Mass % 50.2 54.2   Total Body Water % 39.6 41.6  Muscle-Mass lbs 28.6 29.1  BMI 55.0 45.2  Body Fat Displacement           Torso  lbs 83.7 65.9         Left Leg  lbs 16.7 13.1         Right Leg  lbs 16.7 13.1         Left Arm  lbs 8.3 6.5         Right Arm  lbs 8.3 6.5     Lifestyle & Dietary Hx  Pt has not had a follow-up appointment since the post-op class. Advanced pt to the standard prep plan. Pt states she switched to a patch for her vitamins, stating the capsules made her nauseous. Pt states she wants to go back to "[facility]", stating she needs more accountability.  Estimated daily fluid intake: 64+ oz Estimated daily protein intake: pt states she is not tracking Supplements: multivitamin (patch), calcium/vit D (patch), iron (patch) Current average weekly physical activity: ADLs, nothing consistent, some here and there. Pt states she joined the gym and has done the hula hoop.   24-Hr Dietary Recall First Meal: 6 pm, tuna sub (a few bites) Snack: a few bites of the sub  Second Meal: a few bites of the sub Snack:  buttered or salted popcorn Third Meal: chicken wrap 2:30 am Snack: (not past 4 pm) bag of cheetos or reeses fast break candy or slim  jim Beverages: water, crystal light, un-sweet tea, orange juice (watered down, 4 oz juice with 36 oz of water).  Post-Op Goals/ Signs/ Symptoms Using straws: yes Drinking while eating: no Chewing/swallowing difficulties: no Changes in vision: no Changes to mood/headaches: no Hair loss/changes to skin/nails: no Difficulty focusing/concentrating: no Sweating: no Limb weakness: no Dizziness/lightheadedness: no Palpitations: no  Carbonated/caffeinated beverages: no N/V/D/C/Gas: no Abdominal pain: no Dumping syndrome: no    NUTRITION DIAGNOSIS  Overweight/obesity ([place]-3.3) related to past poor dietary habits and physical inactivity as evidenced by completed bariatric surgery and following dietary guidelines for continued weight loss and healthy nutrition status.     NUTRITION INTERVENTION Nutrition counseling (C-1) and education (E-2) to facilitate bariatric surgery goals, including: Diet advancement to the standard prep plan The importance of consuming adequate calories as well as certain nutrients daily due to the body's need for essential vitamins, minerals, and fats The importance of daily physical activity and to reach a goal of at least 150 minutes of moderate to vigorous physical activity weekly (or as directed by their physician) due to benefits such as increased musculature and improved lab values The importance of intuitive eating specifically learning hunger-satiety cues and understanding the importance of learning a new body: The importance of  mindful eating to avoid grazing behaviors  Encouraged patient to honor their body's internal hunger and fullness cues.  Throughout the day, check in mentally and rate hunger. Stop eating when satisfied not full regardless of how much food is left on the plate.  Get more if still hungry 20-30 minutes later.  The key is to honor satisfaction so throughout the meal, rate fullness factor and stop when comfortably satisfied not physically full.  The key is to honor hunger and fullness without any feelings of guilt or shame.  Pay attention to what the internal cues are, rather than any external factors. This will enhance the confidence you have in listening to your own body and following those internal cues enabling you to increase how often you eat when you are hungry not out of appetite and stop when you are satisfied not full.  Encouraged pt to continue to eat balanced meals inclusive of non starchy vegetables 2 times a day 7 days a week Encouraged pt to choose lean protein sources: limiting beef, pork, sausage, hotdogs, and lunch meat Encourage pt to choose healthy fats such as plant based limiting animal fats Encouraged pt to continue to drink a minium 64 fluid ounces with half being plain water to satisfy proper hydration    Goals Track protein; aim for 60 grams per day Increase physical activity; [facility], 45-60 minutes (spin class, zoomba... something fun) Recommend going back to recommended bariatric multivitamins and calcium tablets/chewables instead of patches.  Handouts Provided Include  Standard Prep Plan Advancement Guide  Learning Style & Readiness for Change Teaching method utilized: Visual & Auditory  Demonstrated degree of understanding via: Teach Back  Readiness Level: preparation Barriers to learning/adherence to lifestyle change: affinity for sweets  RD's Notes for Next Visit Assess adherence to pt chosen goals  MONITORING & EVALUATION Dietary intake, weekly physical activity, body weight.  Next Steps Patient is to follow-up in 3 months for 11-12 month post-op follow-up.

## 2023-03-19 ENCOUNTER — Emergency Department (HOSPITAL_COMMUNITY)
Admission: EM | Admit: 2023-03-19 | Discharge: 2023-03-19 | Disposition: A | Discharge status: Home / Self Care | Payer: BLUE CROSS/BLUE SHIELD | Attending: Emergency Medicine | Admitting: Emergency Medicine

## 2023-03-19 ENCOUNTER — Other Ambulatory Visit: Payer: Self-pay

## 2023-03-19 ENCOUNTER — Emergency Department (HOSPITAL_COMMUNITY): Payer: BLUE CROSS/BLUE SHIELD

## 2023-03-19 DIAGNOSIS — K429 Umbilical hernia without obstruction or gangrene: Secondary | ICD-10-CM | POA: Diagnosis not present

## 2023-03-19 DIAGNOSIS — R1013 Epigastric pain: Secondary | ICD-10-CM

## 2023-03-19 DIAGNOSIS — Z853 Personal history of malignant neoplasm of breast: Secondary | ICD-10-CM | POA: Insufficient documentation

## 2023-03-19 LAB — COMPREHENSIVE METABOLIC PANEL
ALT: 15 U/L (ref 0–44)
AST: 20 U/L (ref 15–41)
Albumin: 4 g/dL (ref 3.5–5.0)
Alkaline Phosphatase: 47 U/L (ref 38–126)
Anion gap: 12 (ref 5–15)
BUN: 7 mg/dL (ref 6–20)
CO2: 22 mmol/L (ref 22–32)
Calcium: 9.1 mg/dL (ref 8.9–10.3)
Chloride: 105 mmol/L (ref 98–111)
Creatinine, Ser: 0.97 mg/dL (ref 0.44–1.00)
GFR, Estimated: >60 mL/min (ref >60)
Glucose, Bld: 86 mg/dL (ref 70–99)
Potassium: 3.9 mmol/L (ref 3.5–5.1)
Sodium: 139 mmol/L (ref 135–145)
Total Bilirubin: 0.6 mg/dL (ref <1.2)
Total Protein: 7.7 g/dL (ref 6.5–8.1)

## 2023-03-19 LAB — CBC WITH DIFFERENTIAL/PLATELET
Abs Immature Granulocytes: 0.02 10*3/uL (ref 0.00–0.07)
Basophils Absolute: 0 10*3/uL (ref 0.0–0.1)
Basophils Relative: 0 %
Eosinophils Absolute: 0.2 10*3/uL (ref 0.0–0.5)
Eosinophils Relative: 3 %
HCT: 36.3 % (ref 36.0–46.0)
Hemoglobin: 12 g/dL (ref 12.0–15.0)
Immature Granulocytes: 0 %
Lymphocytes Relative: 32 %
Lymphs Abs: 1.8 10*3/uL (ref 0.7–4.0)
MCH: 28.3 pg (ref 26.0–34.0)
MCHC: 33.1 g/dL (ref 30.0–36.0)
MCV: 85.6 fL (ref 80.0–100.0)
Monocytes Absolute: 0.7 10*3/uL (ref 0.1–1.0)
Monocytes Relative: 11 %
Neutro Abs: 3.1 10*3/uL (ref 1.7–7.7)
Neutrophils Relative %: 54 %
Platelets: 278 10*3/uL (ref 150–400)
RBC: 4.24 MIL/uL (ref 3.87–5.11)
RDW: 13.2 % (ref 11.5–15.5)
WBC: 5.8 10*3/uL (ref 4.0–10.5)
nRBC: 0 % (ref 0.0–0.2)

## 2023-03-19 LAB — HCG, SERUM, QUALITATIVE: Preg, Serum: NEGATIVE

## 2023-03-19 LAB — URINALYSIS, ROUTINE W REFLEX MICROSCOPIC
Bacteria, UA: NONE SEEN
Bilirubin Urine: NEGATIVE
Glucose, UA: NEGATIVE mg/dL
Ketones, ur: 5 mg/dL — AB
Leukocytes,Ua: NEGATIVE
Nitrite: NEGATIVE
Protein, ur: NEGATIVE mg/dL
Specific Gravity, Urine: 1.013 (ref 1.005–1.030)
pH: 5 (ref 5.0–8.0)

## 2023-03-19 LAB — LIPASE, BLOOD: Lipase: 27 U/L (ref 11–51)

## 2023-03-19 MED ORDER — ALUM & MAG HYDROXIDE-SIMETH 200-200-20 MG/5ML PO SUSP
30.0000 mL | Freq: Once | ORAL | Status: AC
  Administered 2023-03-19: 30 mL via ORAL
  Filled 2023-03-19: qty 30

## 2023-03-19 MED ORDER — DICYCLOMINE HCL 20 MG PO TABS: 20.0000 mg | ORAL_TABLET | Freq: Two times a day (BID) | ORAL | 0 refills | Status: AC

## 2023-03-19 MED ORDER — ONDANSETRON 4 MG PO TBDP: 4.0000 mg | ORAL_TABLET | Freq: Three times a day (TID) | ORAL | 0 refills | Status: DC | PRN

## 2023-03-19 MED ORDER — ACETAMINOPHEN 325 MG PO TABS
650.0000 mg | ORAL_TABLET | Freq: Once | ORAL | Status: AC
  Administered 2023-03-19: 650 mg via ORAL
  Filled 2023-03-19: qty 2

## 2023-03-19 MED ORDER — MORPHINE SULFATE (PF) 4 MG/ML IV SOLN
4.0000 mg | Freq: Once | INTRAVENOUS | Status: DC
  Filled 2023-03-19: qty 1

## 2023-03-19 MED ORDER — OMEPRAZOLE 20 MG PO CPDR: 20.0000 mg | DELAYED_RELEASE_CAPSULE | Freq: Every day | ORAL | 0 refills | Status: DC

## 2023-03-19 MED ORDER — IOHEXOL 300 MG/ML  SOLN
100.0000 mL | Freq: Once | INTRAMUSCULAR | Status: AC | PRN
  Administered 2023-03-19: 100 mL via INTRAVENOUS

## 2023-03-19 MED ORDER — DICYCLOMINE HCL 10 MG PO CAPS
10.0000 mg | ORAL_CAPSULE | Freq: Once | ORAL | Status: AC
  Administered 2023-03-19: 10 mg via ORAL
  Filled 2023-03-19: qty 1

## 2023-03-19 MED ORDER — ONDANSETRON HCL 4 MG/2ML IJ SOLN
4.0000 mg | Freq: Once | INTRAMUSCULAR | Status: AC
  Administered 2023-03-19: 4 mg via INTRAVENOUS
  Filled 2023-03-19: qty 2

## 2023-03-19 NOTE — Discharge Instructions (Addendum)
As we discussed, your workup in the ER today was reassuring for acute findings.  Laboratory evaluation and CT imaging did not reveal any emergent cause of your symptoms.  It did show that you have a hernia just above your bellybutton, however this is not the location of your pain.  I have given you prescription for Bentyl for you to take as prescribed as needed for stomach cramps.  I have given you Prilosec to take if your symptoms are related to acid reflux.  Please take this first thing in the morning before you have eaten anything.  I have also given you a prescription for Zofran which is a nausea medication for you to take as prescribed as needed.  Please follow-up closely with your primary doctor.   Return if development of any new or worsening symptoms.

## 2023-03-19 NOTE — ED Provider Notes (Signed)
[place] EMERGENCY DEPARTMENT AT [facility] Provider Note   CSN: [id number] Arrival date & time: 03/19/23  4401     History  Chief Complaint  Patient presents with   Abdominal Pain    Adriana Moon is a 42 y.o. female.  Patient with history of Roux-en-Y gastric bypass, breast cancer status post mastectomy in remission presents today with complaints of abdominal pain.  She states the same is epigastric in nature and does not radiate.  She denies any nausea or vomiting.  Denies any history of similar symptoms previously.  States that she has not had any complications from her gastric bypass which she had in March 2024.  Denies diarrhea.  She originally thought that she might be constipated, took MiraLAX and did have a small bowel movement.  Denies fevers or chills.  No chest pain or shortness of breath.  No urinary symptoms or vaginal discharge.  The history is provided by the patient. No language interpreter was used.  Abdominal Pain      Home Medications Prior to Admission medications   Medication Sig Start Date End Date Taking? Authorizing Provider  fluconazole (DIFLUCAN) 150 MG tablet Take 1 now and repeat 1 in 3 days 02/27/23   Adline Potter, NP  metroNIDAZOLE (FLAGYL) 500 MG tablet Take 1 tablet (500 mg total) by mouth 2 (two) times daily. 02/27/23   Adline Potter, NP  albuterol (VENTOLIN HFA) 108 (90 Base) MCG/ACT inhaler Inhale 2 puffs into the lungs every 6 (six) hours as needed for wheezing or shortness of breath. 01/03/22   Sonny Masters, FNP  cetirizine (ZYRTEC) 10 MG tablet Take 1-2 tablets 1-2 times a day as needed 06/25/17   Kozlow, Alvira Philips, MD  escitalopram (LEXAPRO) 20 MG tablet Take 1 tablet (20 mg total) by mouth daily. 07/03/22   Adline Potter, NP  fluconazole (DIFLUCAN) 150 MG tablet Take 1 tablet (150 mg total) by mouth every 28 (twenty-eight) days. Take before period starts 12/12/22   Arabella Merles, CNM  fluticasone  [facility]) 50 MCG/ACT nasal spray Place 2 sprays into both nostrils daily. Patient taking differently: Place 2 sprays into both nostrils daily as needed for allergies. 07/26/16   Johna Sheriff, MD  Fluticasone-Umeclidin-Vilant (TRELEGY ELLIPTA) 100-62.5-25 MCG/ACT AEPB Inhale 1 puff into the lungs daily. 01/03/22   Sonny Masters, FNP  Lactic Ac-Citric Ac-Pot Bitart (PHEXXI) 1.8-1-0.4 % GEL Place vaginally.    [provider]  OMEPRAZOLE PO Take by mouth.    [provider]  Spacer/Aero Chamber Mouthpiece MISC 1 each by Does not apply route every 6 (six) hours as needed. 02/21/17   Johna Sheriff, MD  tamoxifen (NOLVADEX) 10 MG tablet Take 1 tablet (10 mg total) by mouth 2 (two) times daily. 12/21/22   Loa Socks, NP  UNABLE TO FIND Magnesium spray-daily    [provider]  Vitamin D, Ergocalciferol, (DRISDOL) 1.25 MG (50000 UNIT) CAPS capsule Take 1 capsule (50,000 Units total) by mouth every 7 (seven) days. 04/10/22   Whitmire, Dawn, FNP      Allergies    Nsaids, Prednisone, Dust mite extract, Other, Pollen extract, Percocet [oxycodone-acetaminophen], and Shellfish allergy    Review of Systems   Review of Systems  Gastrointestinal:  Positive for abdominal pain.  All other systems reviewed and are negative.   Physical Exam Updated Vital Signs BP 119/73   Pulse 70   Temp 98.9 F (37.2 C) (Oral)   Resp 18  Ht 5' (1.524 m)   Wt 105 kg   LMP 02/10/2023 (Approximate)   SpO2 96%   BMI 45.21 kg/m  Physical Exam Vitals and nursing note reviewed.  Constitutional:      General: She is not in acute distress.    Appearance: Normal appearance. She is normal weight. She is not ill-appearing, toxic-appearing or diaphoretic.  HENT:     Head: Normocephalic and atraumatic.  Cardiovascular:     Rate and Rhythm: Normal rate.  Pulmonary:     Effort: Pulmonary effort is normal. No respiratory distress.  Abdominal:     General: Abdomen is flat.      Palpations: Abdomen is soft.     Tenderness: There is abdominal tenderness in the epigastric area. There is no guarding or rebound.  Musculoskeletal:        General: Normal range of motion.     Cervical back: Normal range of motion.  Skin:    General: Skin is warm and dry.  Neurological:     General: No focal deficit present.     Mental Status: She is alert.  Psychiatric:        Mood and Affect: Mood normal.        Behavior: Behavior normal.     ED Results / Procedures / Treatments   Labs (all labs ordered are listed, but only abnormal results are displayed) Labs Reviewed  URINALYSIS, ROUTINE W REFLEX MICROSCOPIC - Abnormal; Notable for the following components:      Result Value   Hgb urine dipstick MODERATE (*)    Ketones, ur 5 (*)    All other components within normal limits  COMPREHENSIVE METABOLIC PANEL  LIPASE, BLOOD  CBC WITH DIFFERENTIAL/PLATELET  HCG, SERUM, QUALITATIVE    EKG None  Radiology CT ABDOMEN PELVIS W CONTRAST  Result Date: 03/19/2023 CLINICAL DATA:  Acute generalized abdominal pain for 3 days. EXAM: CT ABDOMEN AND PELVIS WITH CONTRAST TECHNIQUE: Multidetector CT imaging of the abdomen and pelvis was performed using the standard protocol following bolus administration of intravenous contrast. RADIATION DOSE REDUCTION: This exam was performed according to the departmental dose-optimization program which includes automated exposure control, adjustment of the mA and/or kV according to patient size and/or use of iterative reconstruction technique. CONTRAST:  OMNIPAQUE IOHEXOL 300 MG/ML  SOLN COMPARISON:  None Available. FINDINGS: Lower chest: No acute abnormality. Hepatobiliary: No focal liver abnormality is seen. No gallstones, gallbladder wall thickening, or biliary dilatation. Pancreas: Unremarkable. No pancreatic ductal dilatation or surrounding inflammatory changes. Spleen: Normal in size without focal abnormality. Adrenals/Urinary Tract: Adrenal  glands are unremarkable. Kidneys are normal, without renal calculi, focal lesion, or hydronephrosis. Bladder is unremarkable. Stomach/Bowel: Status post gastric bypass. The appendix appears normal. There is no evidence of bowel obstruction or inflammation. Vascular/Lymphatic: No significant vascular findings are present. No enlarged abdominal or pelvic lymph nodes. Reproductive: Uterus and bilateral adnexa are unremarkable. Other: Small fluid-filled periumbilical hernia is noted. No ascites is noted. Musculoskeletal: No acute or significant osseous findings. IMPRESSION: Small fluid-filled periumbilical hernia. No other acute abnormality seen in the abdomen or pelvis. Electronically Signed   By: Lupita Raider M.D.   On: 03/19/2023 13:26    Procedures Procedures    Medications Ordered in ED Medications  ondansetron (ZOFRAN) injection 4 mg (4 mg Intravenous Given 03/19/23 1050)  acetaminophen (TYLENOL) tablet 650 mg (650 mg Oral Given 03/19/23 1112)  iohexol (OMNIPAQUE) 300 MG/ML solution 100 mL (100 mLs Intravenous Contrast Given 03/19/23 1151)    ED  Course/ Medical Decision Making/ A&P                                 Medical Decision Making Amount and/or Complexity of Data Reviewed Labs: ordered. Radiology: ordered.  Risk OTC drugs. Prescription drug management.   This patient is a 42 y.o. female who presents to the ED for concern of abdominal pain, this involves an extensive number of treatment options, and is a complaint that carries with it a high risk of complications and morbidity. The emergent differential diagnosis prior to evaluation includes, but is not limited to, AAA, gastroenteritis, appendicitis, Bowel obstruction, Bowel perforation. Gastroparesis, DKA, Hernia, Inflammatory bowel disease, mesenteric ischemia, pancreatitis, peritonitis SBP, volvulus.   This is not an exhaustive differential.   Past Medical History / Co-morbidities / Social History:  has a past medical  history of Angio-edema, Anxiety, Arthritis of both knees, Asthma, Bilateral swelling of feet, Breast cancer (HCC), Depression, Family history of breast cancer, Family history of kidney cancer, Family history of lung cancer, Family history of prostate cancer, GERD (gastroesophageal reflux disease), IBS (irritable bowel syndrome), Insulin resistance, Joint pain, Kidney failure, Lactose intolerance, Migraines, PCOS (polycystic ovarian syndrome), Urticaria, and Varicose veins of legs.  Additional history: Chart reviewed.  Physical Exam: Physical exam performed. The pertinent findings include: epigastric abdominal TTP  Lab Tests: I ordered, and personally interpreted labs.  The pertinent results include:  ketonuria. No other acute laboratory abnormalities   Imaging Studies: I ordered imaging studies including CT abdomen/pelvis. I independently visualized and interpreted imaging which showed   Small fluid-filled periumbilical hernia.   No other acute abnormality seen in the abdomen or pelvis.  I agree with the radiologist interpretation.   Medications: I ordered medication including tylenol, GI cocktail, bentyl, zofran  for pain, nausea. Reevaluation of the patient after these medicines showed that the patient improved. I have reviewed the patients home medicines and have made adjustments as needed.  Disposition: After consideration of the diagnostic results and the patients response to treatment, I feel that emergency department workup does not suggest an emergent condition requiring admission or immediate intervention beyond what has been performed at this time. The plan is: discharge with close outpatient follow-up and return precautions.  Patient's labs and imaging is benign.  He does have a periumbilical hernia, however this is not her area of tenderness.  I did discuss this finding with the patient is understanding and in agreement.  Symptoms potentially due to acid reflux.  She did have some  improvement with a GI cocktail and Bentyl.  Send prescription for Bentyl and Prilosec.  Recommend close PCP follow-up. Evaluation and diagnostic testing in the emergency department does not suggest an emergent condition requiring admission or immediate intervention beyond what has been performed at this time.  Plan for discharge with close PCP follow-up.  Patient is understanding and amenable with plan, educated on red flag symptoms that would prompt immediate return.  Patient discharged in stable condition.  Final Clinical Impression(s) / ED Diagnoses Final diagnoses:  Epigastric pain  Periumbilical hernia    Rx / DC Orders ED Discharge Orders          Ordered    dicyclomine (BENTYL) 20 MG tablet  2 times daily        03/19/23 1505    ondansetron (ZOFRAN-ODT) 4 MG disintegrating tablet  Every 8 hours PRN        03/19/23 1505  omeprazole (PRILOSEC) 20 MG capsule  Daily        03/19/23 1505          An After Visit Summary was printed and given to the patient.     Vear Clock 03/19/23 1507    Terald Sleeper, MD 03/20/23 [phone]

## 2023-03-19 NOTE — ED Triage Notes (Signed)
Pt arrived reporting abdominal pain x3 days. Also reports lower back pain. Denies vomiting. States took Miralax yesterday, had a few BM since. Denies blood in stool

## 2023-06-03 ENCOUNTER — Ambulatory Visit: Payer: BLUE CROSS/BLUE SHIELD | Admitting: Dietician

## 2023-06-17 ENCOUNTER — Other Ambulatory Visit (HOSPITAL_COMMUNITY)
Admission: RE | Admit: 2023-06-17 | Discharge: 2023-06-17 | Disposition: A | Discharge status: Home / Self Care | Payer: BLUE CROSS/BLUE SHIELD | Source: Ambulatory Visit | Attending: Obstetrics & Gynecology | Admitting: Obstetrics & Gynecology

## 2023-06-17 ENCOUNTER — Other Ambulatory Visit: Payer: BLUE CROSS/BLUE SHIELD

## 2023-06-17 DIAGNOSIS — N898 Other specified noninflammatory disorders of vagina: Secondary | ICD-10-CM

## 2023-06-17 NOTE — Progress Notes (Signed)
   NURSE VISIT- VAGINITIS  SUBJECTIVE:  Adriana Moon is a 43 y.o. [id number] GYN patientfemale here for a vaginal swab for vaginitis screening.  She reports the following symptoms: discharge described as milky and vulvar itching for 2 days. Denies abnormal vaginal bleeding, significant pelvic pain, fever, or UTI symptoms.  OBJECTIVE:  There were no vitals taken for this visit.  Appears well, in no apparent distress  ASSESSMENT: Vaginal swab for vaginitis screening  PLAN: Self-collected vaginal probe for Gonorrhea, Chlamydia, Trichomonas, Bacterial Vaginosis, Yeast sent to lab Treatment: to be determined once results are received Follow-up as needed if symptoms persist/worsen, or new symptoms develop  Jobe Marker  06/17/2023 5:03 PM

## 2023-06-19 LAB — CERVICOVAGINAL ANCILLARY ONLY
Bacterial Vaginitis (gardnerella): NEGATIVE
Candida Glabrata: NEGATIVE
Candida Vaginitis: POSITIVE — AB
Chlamydia: NEGATIVE
Comment: NEGATIVE
Comment: NEGATIVE
Comment: NEGATIVE
Comment: NEGATIVE
Comment: NEGATIVE
Comment: NORMAL
Neisseria Gonorrhea: NEGATIVE
Trichomonas: NEGATIVE

## 2023-06-20 ENCOUNTER — Encounter: Payer: Self-pay | Admitting: Obstetrics & Gynecology

## 2023-08-14 ENCOUNTER — Other Ambulatory Visit: Payer: Self-pay | Admitting: Adult Health

## 2023-08-14 MED ORDER — FLUCONAZOLE 150 MG PO TABS: ORAL_TABLET | ORAL | 1 refills | Status: DC

## 2023-08-14 NOTE — Progress Notes (Signed)
 Rx sent for diflucan

## 2023-09-04 ENCOUNTER — Encounter: Payer: Self-pay | Admitting: Podiatry

## 2023-09-04 ENCOUNTER — Ambulatory Visit (INDEPENDENT_AMBULATORY_CARE_PROVIDER_SITE_OTHER): Admitting: Podiatry

## 2023-09-04 ENCOUNTER — Ambulatory Visit (INDEPENDENT_AMBULATORY_CARE_PROVIDER_SITE_OTHER)

## 2023-09-04 DIAGNOSIS — M7751 Other enthesopathy of right foot: Secondary | ICD-10-CM

## 2023-09-04 DIAGNOSIS — M722 Plantar fascial fibromatosis: Secondary | ICD-10-CM | POA: Diagnosis not present

## 2023-09-04 DIAGNOSIS — M7752 Other enthesopathy of left foot: Secondary | ICD-10-CM | POA: Diagnosis not present

## 2023-09-04 NOTE — Progress Notes (Signed)
 Chief Complaint  Patient presents with   Foot Pain    RM# Bilateral foot pain    HPI: 43 y.o. female presenting today for follow-up evaluation of chronic bilateral foot and ankle pain.  Overall the patient is doing significantly better.  She longer has any pain to the heels on a daily basis.  She says that it is very manageable.  She has lost a significant of weight with bariatric surgery and she believes that weight loss helped significantly as well.  She wears her orthotics on a daily basis and she says that helped significantly  Past Medical History:  Diagnosis Date   Angio-edema    Anxiety    manages naturally   Arthritis of both knees    Asthma    Bilateral swelling of feet    Breast cancer (HCC)    twice   Depression    pt. reports that she tends to get down & has had Lexapro  in the past but its been taken off her list for now.    Family history of breast cancer    Family history of kidney cancer    Family history of lung cancer    Family history of prostate cancer    GERD (gastroesophageal reflux disease)    IBS (irritable bowel syndrome)    Insulin  resistance    Joint pain    Kidney failure    stage 1   Lactose intolerance    Migraines    PCOS (polycystic ovarian syndrome)    Urticaria    Varicose veins of legs     Past Surgical History:  Procedure Laterality Date   BREAST LUMPECTOMY Right 2009   BREAST RECONSTRUCTION WITH PLACEMENT OF TISSUE EXPANDER AND FLEX HD (ACELLULAR HYDRATED DERMIS) Bilateral 12/22/2018   Procedure: BILATERAL BREAST RECONSTRUCTION WITH PLACEMENT OF TISSUE EXPANDER AND FLEX HD (ACELLULAR HYDRATED DERMIS);  Surgeon: Thornell Flirt, DO;  Location: [facility];  Service: Plastics;  Laterality: Bilateral;   BREAST REDUCTION SURGERY Bilateral 01/25/2015   Procedure: MAMMARY REDUCTION  (BREAST);  Surgeon: Tommye Franc, MD;  Location: [facility];  Service: Plastics;  Laterality: Bilateral;   CESAREAN SECTION     x2  2002  & 2006   ECTOPIC PREGNANCY SURGERY  06/2018   R side , done at [facility]    ENDOMETRIAL ABLATION     GASTRIC ROUX-EN-Y N/A 07/16/2022   Procedure: LAPAROSCOPIC ROUX-EN-Y GASTRIC BYPASS WITH UPPER ENDOSCOPY;  Surgeon: Adalberto Acton, MD;  Location: [facility];  Service: General;  Laterality: N/A;   MASTECTOMY W/ SENTINEL NODE BIOPSY Bilateral 12/22/2018   with tissue expanders   MASTECTOMY W/ SENTINEL NODE BIOPSY Bilateral 12/22/2018   Procedure: BILATERAL MASTECTOMIES WITH RIGHT SENTINEL LYMPH NODE BIOPSY;  Surgeon: Oza Blumenthal, MD;  Location: [facility];  Service: General;  Laterality: Bilateral;   PLANTAR FASCIA SURGERY Bilateral    REDUCTION MAMMAPLASTY Left    REMOVAL OF BILATERAL TISSUE EXPANDERS WITH PLACEMENT OF BILATERAL BREAST IMPLANTS Bilateral 04/06/2019   Procedure: REMOVAL OF BILATERAL TISSUE EXPANDERS WITH PLACEMENT OF BILATERAL BREAST IMPLANTS;  Surgeon: Thornell Flirt, DO;  Location: [facility];  Service: Plastics;  Laterality: Bilateral;   RT lumpectomy     TONSILLECTOMY AND ADENOIDECTOMY N/A 10/11/2020   Procedure: TONSILLECTOMY AND ADENOIDECTOMY;  Surgeon: Reynold Caves, MD;  Location: [facility];  Service: ENT;  Laterality: N/A;    Allergies  Allergen Reactions   Nsaids Hives, Swelling and Anaphylaxis   Prednisone   Lost taste in her mouth, also makes "me crazy & mean"    Dust Mite Extract Cough   Other Cough    pollen   Pollen Extract Cough   Percocet [Oxycodone -Acetaminophen ] Nausea Only    Very lightheaded.   Shellfish Allergy Hives and Swelling     Physical Exam: General: The patient is alert and oriented x3 in no acute distress.  Dermatology: Skin is warm, dry and supple bilateral lower extremities. Negative for open lesions or macerations.  Vascular: Palpable pedal pulses bilaterally. Capillary refill within normal limits.  Negative for any significant edema or erythema  Neurological: Grossly intact via light touch  Musculoskeletal Exam: Generalized  tenderness throughout the ankles bilateral.  Minimal tenderness to palpation to the plantar heels  MRI RT foot wo contrast 09/06/2020: IMPRESSION: 1. No acute abnormality. 2. Mild tibiotalar osteoarthritis. 3. Chronic postsurgical changes of the proximal plantar fascia without tear or acute inflammation.   MRI LT foot wo contrast 09/06/2020: IMPRESSION: 1. No acute abnormality. 2. Chronic postsurgical changes of the proximal plantar fascia without tear or acute inflammation  Radiographic exam B/L feet 09/04/2023: Normal osseous mineralization.  Joint spaces preserved.  Impression: Negative  Assessment: 1. Chronic bilateral foot pain/plantar fasciitis bilateral 2. H/o EPF B/L.  DOS: 10/22/2019   Plan of Care:  -Patient evaluated -Continue custom orthotics that the patient is currently wearing with good supportive tennis shoes -Appointment for new custom molded orthotics to support the medial longitudinal arch of the foot and alleviate the patient's chronic foot and heel pain -Return to clinic with me as needed     Dot Gazella, DPM [facility]  Dr. Dot Gazella, DPM    [address] [place], [place] [address]                Office [phone]  Fax [phone]

## 2023-09-11 ENCOUNTER — Telehealth: Payer: Self-pay

## 2023-09-11 NOTE — Telephone Encounter (Signed)
 LVM to resched 6/20 appt// provider out of office

## 2023-10-11 ENCOUNTER — Other Ambulatory Visit

## 2023-12-26 ENCOUNTER — Other Ambulatory Visit (HOSPITAL_COMMUNITY): Payer: Self-pay

## 2023-12-26 ENCOUNTER — Ambulatory Visit: Admitting: Family Medicine

## 2023-12-26 ENCOUNTER — Telehealth: Payer: Self-pay | Admitting: Pharmacy Technician

## 2023-12-26 VITALS — BP 109/75 | HR 52 | Resp 16 | Ht 60.0 in | Wt 220.0 lb

## 2023-12-26 DIAGNOSIS — F32A Depression, unspecified: Secondary | ICD-10-CM

## 2023-12-26 DIAGNOSIS — Z0001 Encounter for general adult medical examination with abnormal findings: Secondary | ICD-10-CM | POA: Diagnosis not present

## 2023-12-26 DIAGNOSIS — Z1159 Encounter for screening for other viral diseases: Secondary | ICD-10-CM

## 2023-12-26 DIAGNOSIS — E559 Vitamin D deficiency, unspecified: Secondary | ICD-10-CM

## 2023-12-26 DIAGNOSIS — E038 Other specified hypothyroidism: Secondary | ICD-10-CM

## 2023-12-26 DIAGNOSIS — J4540 Moderate persistent asthma, uncomplicated: Secondary | ICD-10-CM

## 2023-12-26 DIAGNOSIS — F419 Anxiety disorder, unspecified: Secondary | ICD-10-CM | POA: Diagnosis not present

## 2023-12-26 DIAGNOSIS — R7301 Impaired fasting glucose: Secondary | ICD-10-CM

## 2023-12-26 DIAGNOSIS — R062 Wheezing: Secondary | ICD-10-CM

## 2023-12-26 DIAGNOSIS — D509 Iron deficiency anemia, unspecified: Secondary | ICD-10-CM

## 2023-12-26 DIAGNOSIS — J069 Acute upper respiratory infection, unspecified: Secondary | ICD-10-CM

## 2023-12-26 DIAGNOSIS — E782 Mixed hyperlipidemia: Secondary | ICD-10-CM

## 2023-12-26 MED ORDER — ESCITALOPRAM OXALATE 10 MG PO TABS: 10.0000 mg | ORAL_TABLET | Freq: Every day | ORAL | 1 refills | Status: AC

## 2023-12-26 MED ORDER — TRELEGY ELLIPTA 100-62.5-25 MCG/ACT IN AEPB: 1.0000 | INHALATION_SPRAY | Freq: Every day | RESPIRATORY_TRACT | 3 refills | Status: AC

## 2023-12-26 MED ORDER — VITAMIN D (ERGOCALCIFEROL) 1.25 MG (50000 UNIT) PO CAPS: 50000.0000 [IU] | ORAL_CAPSULE | ORAL | 2 refills | Status: AC

## 2023-12-26 MED ORDER — ALBUTEROL SULFATE HFA 108 (90 BASE) MCG/ACT IN AERS: 2.0000 | INHALATION_SPRAY | Freq: Four times a day (QID) | RESPIRATORY_TRACT | 2 refills | Status: AC | PRN

## 2023-12-26 NOTE — Addendum Note (Signed)
 Addended by: TERRY WILHELMENA LLOYD HILARIO on: 12/26/2023 09:08 AM   Modules accepted: Orders

## 2023-12-26 NOTE — Assessment & Plan Note (Signed)

## 2023-12-26 NOTE — Progress Notes (Signed)
 Complete physical exam  Patient: Adriana Moon   DOB: 1980/09/29   43 y.o. Female  MRN: [id number]  Subjective:    Chief Complaint  Patient presents with   Establish Care    Trinity Haun is a 43 y.o. female who presents today for a complete physical exam. She reports consuming a general diet. The patient does not participate in regular exercise at present. She generally feels okay sometimes anxious. She reports sleeping well. She does have additional problems to discuss today.    Most recent fall risk assessment:    12/26/2023    8:11 AM  Fall Risk   Falls in the past year? 0  Number falls in past yr: 0  Injury with Fall? 0  Follow up Falls evaluation completed     Most recent depression screenings:    12/26/2023    8:11 AM 06/19/2022   11:13 AM  PHQ 2/9 Scores  PHQ - 2 Score 4 0  PHQ- 9 Score 14     Vision:Within last year and Dental: No current dental problems and Last dental visit: 20024  Patient Care Team: Del Wilhelmena Lloyd Sola, FNP as PCP - General (Family Medicine) Shaaron, Lamar HERO, MD as Consulting Physician (Gastroenterology) Odean Potts, MD as Consulting Physician (Hematology and Oncology) Dillingham, Estefana RAMAN, DO as Attending Physician (Plastic Surgery) Vernetta Berg, MD as Consulting Physician (General Surgery) Shannon Agent, MD as Consulting Physician (Radiation Oncology)   Outpatient Medications Prior to Visit  Medication Sig   tamoxifen  (NOLVADEX ) 10 MG tablet Take 1 tablet (10 mg total) by mouth 2 (two) times daily.   [DISCONTINUED] albuterol  (VENTOLIN  HFA) 108 (90 Base) MCG/ACT inhaler Inhale 2 puffs into the lungs every 6 (six) hours as needed for wheezing or shortness of breath.   [DISCONTINUED] escitalopram  (LEXAPRO ) 20 MG tablet Take 1 tablet (20 mg total) by mouth daily.   [DISCONTINUED] Fluticasone -Umeclidin-Vilant (TRELEGY ELLIPTA ) 100-62.5-25 MCG/ACT AEPB Inhale 1 puff into the lungs daily.   [DISCONTINUED]  omeprazole  (PRILOSEC) 20 MG capsule Take 1 capsule (20 mg total) by mouth daily for 14 days.   cetirizine  (ZYRTEC ) 10 MG tablet Take 1-2 tablets 1-2 times a day as needed   dicyclomine  (BENTYL ) 20 MG tablet Take 1 tablet (20 mg total) by mouth 2 (two) times daily.   fluticasone  (FLONASE ) 50 MCG/ACT nasal spray Place 2 sprays into both nostrils daily. (Patient taking differently: Place 2 sprays into both nostrils daily as needed for allergies.)   Spacer/Aero Chamber Mouthpiece MISC 1 each by Does not apply route every 6 (six) hours as needed.   [DISCONTINUED] fluconazole  (DIFLUCAN ) 150 MG tablet Take 1 now and repeat 1 in 3 days   [DISCONTINUED] Lactic Ac-Citric Ac-Pot Bitart (PHEXXI ) 1.8-1-0.4 % GEL Place vaginally.   [DISCONTINUED] metroNIDAZOLE  (FLAGYL ) 500 MG tablet Take 1 tablet (500 mg total) by mouth 2 (two) times daily.   [DISCONTINUED] ondansetron  (ZOFRAN -ODT) 4 MG disintegrating tablet Take 1 tablet (4 mg total) by mouth every 8 (eight) hours as needed for nausea or vomiting.   [DISCONTINUED] UNABLE TO FIND Magnesium spray-daily   [DISCONTINUED] Vitamin D , Ergocalciferol , (DRISDOL ) 1.25 MG (50000 UNIT) CAPS capsule Take 1 capsule (50,000 Units total) by mouth every 7 (seven) days.   No facility-administered medications prior to visit.    Review of Systems  Constitutional:  Negative for chills and fever.  HENT:  Negative for ear pain.   Eyes:  Negative for blurred vision.  Cardiovascular:  Negative for chest pain.  Gastrointestinal:  Positive for abdominal pain.  Genitourinary:  Negative for dysuria.  Musculoskeletal:  Positive for myalgias.  Neurological:  Negative for dizziness and headaches.  Psychiatric/Behavioral:  The patient is nervous/anxious.        Objective:    BP 109/75   Pulse (!) 52   Resp 16   Ht 5' (1.524 m)   Wt 220 lb (99.8 kg)   SpO2 97%   BMI 42.97 kg/m  BP Readings from Last 3 Encounters:  12/26/23 109/75  03/19/23 127/83  02/26/23 102/76       Physical Exam Vitals reviewed.  Constitutional:      General: She is not in acute distress.    Appearance: Normal appearance. She is not ill-appearing, toxic-appearing or diaphoretic.  HENT:     Head: Normocephalic.     Right Ear: Tympanic membrane normal.     Left Ear: Tympanic membrane normal.     Nose: Nose normal.     Mouth/Throat:     Mouth: Mucous membranes are moist.  Eyes:     General:        Right eye: No discharge.        Left eye: No discharge.     Conjunctiva/sclera: Conjunctivae normal.     Pupils: Pupils are equal, round, and reactive to light.  Cardiovascular:     Rate and Rhythm: Normal rate.     Pulses: Normal pulses.     Heart sounds: Normal heart sounds.  Pulmonary:     Effort: Pulmonary effort is normal. No respiratory distress.     Breath sounds: Normal breath sounds.  Abdominal:     General: Bowel sounds are normal.     Palpations: Abdomen is soft.     Tenderness: There is no abdominal tenderness. There is no right CVA tenderness, left CVA tenderness or guarding.  Musculoskeletal:        General: Normal range of motion.     Cervical back: Normal range of motion.  Skin:    General: Skin is warm and dry.  Neurological:     Mental Status: She is alert.     Coordination: Coordination normal.     Gait: Gait normal.  Psychiatric:        Mood and Affect: Mood normal.        Behavior: Behavior normal.      No results found for any visits on 12/26/23.    Assessment & Plan:    Routine Health Maintenance and Physical Exam  Immunization History  Administered Date(s) Administered   Hep B, Unspecified 09/12/2007, 11/03/2007, 12/23/2008   Hepatitis A 09/12/2007, 11/03/2007, 12/23/2008   Hepatitis A, Adult 09/12/2007, 11/03/2007, 12/23/2008   Hepatitis B 09/12/2007, 11/03/2007, 12/23/2008   Influenza Inj Mdck Quad Pf 01/21/2019   Influenza Inj Mdck Quad With Preservative 01/21/2019   Influenza,inj,Quad PF,6+ Mos 05/19/2015, 04/20/2016, 06/25/2017    Influenza,inj,quad, With Preservative 05/24/2017   MMR 11/03/2007, 09/29/2009   Td 04/15/2004   Tdap 09/10/2007    Health Maintenance  Topic Date Due   COVID-19 Vaccine (1) Never done   Hepatitis C Screening  Never done   Pneumococcal Vaccine (1 of 2 - PCV) Never done   HPV VACCINES (1 - Risk 3-dose SCDM series) Never done   DTaP/Tdap/Td (3 - Td or Tdap) 09/09/2017   INFLUENZA VACCINE  11/22/2023   Cervical Cancer Screening (HPV/Pap Cotest)  05/18/2027   HIV Screening  Completed   Meningococcal B Vaccine  Aged Out   Hepatitis B Vaccines 19-59 Average  Risk  Discontinued    Discussed health benefits of physical activity, and encouraged her to engage in regular exercise appropriate for her age and condition.  Vitamin D  deficiency -     Vitamin D  (Ergocalciferol ); Take 1 capsule (50,000 Units total) by mouth every 7 (seven) days.  Dispense: 12 capsule; Refill: 2 -     VITAMIN D  25 Hydroxy (Vit-D Deficiency, Fractures)  Anxiety and depression Assessment & Plan: Flowsheet Row Office Visit from 12/26/2023 in [facility]  PHQ-9 Total Score 14    Restart Lexapro  10 mg once daily We discussed several non-pharmacological approaches to managing anxiety and depression, including:  Establishing a consistent daily routine: This helps create structure and stability. Practicing mindfulness and relaxation techniques: Incorporating meditation, deep breathing exercises, or yoga to manage stress and improve emotional well-being. Engaging in regular physical activity: Aim for at least 30 minutes of exercise most days to boost mood and energy levels. Spending time outdoors: Exposure to natural light and fresh air can improve mental health. Building a support network: Encouraging social connections with friends, family, or support groups to reduce feelings of isolation. Prioritizing a balanced diet: Eating nutrient-rich foods while avoiding excessive amounts of processed foods,  sugar, and unhealthy fats. Follow-up is recommended in 4-8 weeks to assess progress, with a referral to behavioral health for further support if needed.  Patient verbally consented to [facility] services about presenting concerns and psychiatric consultation as appropriate. The services will be billed as appropriate for the patient.     Orders: -     Escitalopram  Oxalate; Take 1 tablet (10 mg total) by mouth daily.  Dispense: 90 tablet; Refill: 1 -     Ambulatory referral to Behavioral Health  Moderate persistent asthma without complication -     Albuterol  Sulfate HFA; Inhale 2 puffs into the lungs every 6 (six) hours as needed for wheezing or shortness of breath.  Dispense: 3 each; Refill: 2 -     Trelegy Ellipta ; Inhale 1 puff into the lungs daily.  Dispense: 60 each; Refill: 3  URI with cough and congestion -     Albuterol  Sulfate HFA; Inhale 2 puffs into the lungs every 6 (six) hours as needed for wheezing or shortness of breath.  Dispense: 3 each; Refill: 2 -     Trelegy Ellipta ; Inhale 1 puff into the lungs daily.  Dispense: 60 each; Refill: 3  Wheezing -     Albuterol  Sulfate HFA; Inhale 2 puffs into the lungs every 6 (six) hours as needed for wheezing or shortness of breath.  Dispense: 3 each; Refill: 2 -     Trelegy Ellipta ; Inhale 1 puff into the lungs daily.  Dispense: 60 each; Refill: 3  IFG (impaired fasting glucose) -     Hemoglobin A1c  TSH (thyroid -stimulating hormone deficiency) -     TSH + free T4  Mixed hyperlipidemia -     Lipid panel -     CMP14+EGFR -     CBC with Differential/Platelet  Need for hepatitis C screening test -     Hepatitis C antibody  Iron deficiency anemia, unspecified iron deficiency anemia type -     Iron, TIBC and Ferritin Panel  Encounter for routine adult physical exam with abnormal findings Assessment & Plan: A comprehensive physical examination was completed, and necessary labs were ordered. Screening and  health maintenance recommendations have been updated. The patient received counseling on exercise and nutrition. BMI was assessed and discussed  Advise for heart health, focus on: Eat more fruits and vegetables: Aim for a variety of colors. Choose whole grains: Brown rice, oats, and whole-wheat bread. Limit unhealthy fats: Avoid trans fats; use olive or avocado oil instead. Include lean proteins: Opt for fish, chicken, beans, and legumes. Reduce sodium: Limit processed foods and add less salt. Stay hydrated: Drink plenty of water . Exercise regularly: Aim for at least 30 minutes of moderate exercise, like walking or cycling, 5 days a week.       Return in about 6 months (around 06/24/2024), or if symptoms worsen or fail to improve, for Follow up.     Hilario Kidd Wilhelmena Falter, FNP

## 2023-12-26 NOTE — Patient Instructions (Signed)

## 2023-12-26 NOTE — Progress Notes (Deleted)
 New Patient Office Visit   Subjective   Patient ID: Adriana Moon, female    DOB: 1981-03-09  Age: 43 y.o. MRN: [id number]  CC:  Chief Complaint  Patient presents with   Establish Care    HPI Adriana Moon presents to establish care. She  has a past medical history of Allergy, Angio-edema, Anxiety, Arthritis of both knees, Asthma, Bilateral swelling of feet, Breast cancer (HCC), Depression, Family history of breast cancer, Family history of kidney cancer, Family history of lung cancer, Family history of prostate cancer, GERD (gastroesophageal reflux disease), IBS (irritable bowel syndrome), Insulin  resistance, Joint pain, Kidney failure, Lactose intolerance, Migraines, PCOS (polycystic ovarian syndrome), Urticaria, and Varicose veins of legs.  HPI    Outpatient Encounter Medications as of 12/26/2023  Medication Sig   albuterol  (VENTOLIN  HFA) 108 (90 Base) MCG/ACT inhaler Inhale 2 puffs into the lungs every 6 (six) hours as needed for wheezing or shortness of breath.   escitalopram  (LEXAPRO ) 20 MG tablet Take 1 tablet (20 mg total) by mouth daily.   Fluticasone -Umeclidin-Vilant (TRELEGY ELLIPTA ) 100-62.5-25 MCG/ACT AEPB Inhale 1 puff into the lungs daily.   omeprazole  (PRILOSEC) 20 MG capsule Take 1 capsule (20 mg total) by mouth daily for 14 days.   tamoxifen  (NOLVADEX ) 10 MG tablet Take 1 tablet (10 mg total) by mouth 2 (two) times daily.   cetirizine  (ZYRTEC ) 10 MG tablet Take 1-2 tablets 1-2 times a day as needed   dicyclomine  (BENTYL ) 20 MG tablet Take 1 tablet (20 mg total) by mouth 2 (two) times daily.   fluticasone  (FLONASE ) 50 MCG/ACT nasal spray Place 2 sprays into both nostrils daily. (Patient taking differently: Place 2 sprays into both nostrils daily as needed for allergies.)   Lactic Ac-Citric Ac-Pot Bitart (PHEXXI ) 1.8-1-0.4 % GEL Place vaginally.   Spacer/Aero Chamber Mouthpiece MISC 1 each by Does not apply route every 6 (six) hours as  needed.   UNABLE TO FIND Magnesium spray-daily   Vitamin D , Ergocalciferol , (DRISDOL ) 1.25 MG (50000 UNIT) CAPS capsule Take 1 capsule (50,000 Units total) by mouth every 7 (seven) days.   [DISCONTINUED] fluconazole  (DIFLUCAN ) 150 MG tablet Take 1 now and repeat 1 in 3 days   [DISCONTINUED] metroNIDAZOLE  (FLAGYL ) 500 MG tablet Take 1 tablet (500 mg total) by mouth 2 (two) times daily.   [DISCONTINUED] ondansetron  (ZOFRAN -ODT) 4 MG disintegrating tablet Take 1 tablet (4 mg total) by mouth every 8 (eight) hours as needed for nausea or vomiting.   No facility-administered encounter medications on file as of 12/26/2023.    Past Surgical History:  Procedure Laterality Date   BREAST LUMPECTOMY Right 2009   BREAST RECONSTRUCTION WITH PLACEMENT OF TISSUE EXPANDER AND FLEX HD (ACELLULAR HYDRATED DERMIS) Bilateral 12/22/2018   Procedure: BILATERAL BREAST RECONSTRUCTION WITH PLACEMENT OF TISSUE EXPANDER AND FLEX HD (ACELLULAR HYDRATED DERMIS);  Surgeon: Lowery Estefana RAMAN, DO;  Location: [facility];  Service: Plastics;  Laterality: Bilateral;   BREAST REDUCTION SURGERY Bilateral 01/25/2015   Procedure: MAMMARY REDUCTION  (BREAST);  Surgeon: Ronal Jenkins Mage, MD;  Location: [facility];  Service: Plastics;  Laterality: Bilateral;   CESAREAN SECTION     x2  2002 & 2006   COSMETIC SURGERY     ECTOPIC PREGNANCY SURGERY  06/2018   R side , done at [facility]    ENDOMETRIAL ABLATION     GASTRIC ROUX-EN-Y N/A 07/16/2022   Procedure: LAPAROSCOPIC ROUX-EN-Y GASTRIC BYPASS WITH UPPER ENDOSCOPY;  Surgeon: Signe Mitzie LABOR, MD;  Location: [facility];  Service: General;  Laterality: N/A;   MASTECTOMY W/ SENTINEL NODE BIOPSY Bilateral 12/22/2018   with tissue expanders   MASTECTOMY W/ SENTINEL NODE BIOPSY Bilateral 12/22/2018   Procedure: BILATERAL MASTECTOMIES WITH RIGHT SENTINEL LYMPH NODE BIOPSY;  Surgeon: Vernetta Berg, MD;  Location: [facility];  Service: General;  Laterality: Bilateral;    PLANTAR FASCIA SURGERY Bilateral    REDUCTION MAMMAPLASTY Left    REMOVAL OF BILATERAL TISSUE EXPANDERS WITH PLACEMENT OF BILATERAL BREAST IMPLANTS Bilateral 04/06/2019   Procedure: REMOVAL OF BILATERAL TISSUE EXPANDERS WITH PLACEMENT OF BILATERAL BREAST IMPLANTS;  Surgeon: Lowery Estefana RAMAN, DO;  Location: [facility];  Service: Plastics;  Laterality: Bilateral;   RT lumpectomy     TONSILLECTOMY AND ADENOIDECTOMY N/A 10/11/2020   Procedure: TONSILLECTOMY AND ADENOIDECTOMY;  Surgeon: Karis Clunes, MD;  Location: [facility];  Service: ENT;  Laterality: N/A;    ROS    Objective    BP 109/75   Pulse (!) 52   Resp 16   Ht 5' (1.524 m)   Wt 220 lb (99.8 kg)   SpO2 97%   BMI 42.97 kg/m   Physical Exam    Assessment & Plan:  Vitamin D  deficiency  Anxiety and depression  Moderate persistent asthma without complication  URI with cough and congestion  Wheezing    No follow-ups on file.   Hilario Kidd Wilhelmena Falter, FNP

## 2023-12-26 NOTE — Telephone Encounter (Signed)
 Pharmacy Patient Advocate Encounter   Received notification from CoverMyMeds that prior authorization for Albuterol  Sulfate HFA 108 (90 Base)MCG/ACT aerosol is required/requested.   Insurance verification completed.   The patient is insured through [facility] .   Per test claim: The current 30 day co-pay is, $24.87.  No PA needed at this time. This test claim was processed through [facility]- copay amounts may vary at other pharmacies due to pharmacy/plan contracts, or as the patient moves through the different stages of their insurance plan.

## 2023-12-26 NOTE — Assessment & Plan Note (Signed)
 Flowsheet Row Office Visit from 12/26/2023 in [facility]  PHQ-9 Total Score 14    Restart Lexapro  10 mg once daily We discussed several non-pharmacological approaches to managing anxiety and depression, including:  Establishing a consistent daily routine: This helps create structure and stability. Practicing mindfulness and relaxation techniques: Incorporating meditation, deep breathing exercises, or yoga to manage stress and improve emotional well-being. Engaging in regular physical activity: Aim for at least 30 minutes of exercise most days to boost mood and energy levels. Spending time outdoors: Exposure to natural light and fresh air can improve mental health. Building a support network: Encouraging social connections with friends, family, or support groups to reduce feelings of isolation. Prioritizing a balanced diet: Eating nutrient-rich foods while avoiding excessive amounts of processed foods, sugar, and unhealthy fats. Follow-up is recommended in 4-8 weeks to assess progress, with a referral to behavioral health for further support if needed.  Patient verbally consented to [facility] services about presenting concerns and psychiatric consultation as appropriate. The services will be billed as appropriate for the patient.

## 2023-12-27 ENCOUNTER — Ambulatory Visit: Payer: Self-pay | Admitting: Family Medicine

## 2023-12-27 LAB — LIPID PANEL
Chol/HDL Ratio: 2.1 ratio (ref 0.0–4.4)
Cholesterol, Total: 118 mg/dL (ref 100–199)
HDL: 57 mg/dL (ref >39)
LDL Chol Calc (NIH): 49 mg/dL (ref 0–99)
Triglycerides: 52 mg/dL (ref 0–149)
VLDL Cholesterol Cal: 12 mg/dL (ref 5–40)

## 2023-12-27 LAB — CBC WITH DIFFERENTIAL/PLATELET
Basophils Absolute: 0 x10E3/uL (ref 0.0–0.2)
Basos: 1 %
EOS (ABSOLUTE): 0.1 x10E3/uL (ref 0.0–0.4)
Eos: 4 %
Hematocrit: 39.3 % (ref 34.0–46.6)
Hemoglobin: 12.5 g/dL (ref 11.1–15.9)
Immature Grans (Abs): 0 x10E3/uL (ref 0.0–0.1)
Immature Granulocytes: 0 %
Lymphocytes Absolute: 2.3 x10E3/uL (ref 0.7–3.1)
Lymphs: 57 %
MCH: 28.2 pg (ref 26.6–33.0)
MCHC: 31.8 g/dL (ref 31.5–35.7)
MCV: 89 fL (ref 79–97)
Monocytes Absolute: 0.4 x10E3/uL (ref 0.1–0.9)
Monocytes: 11 %
Neutrophils Absolute: 1.1 x10E3/uL — ABNORMAL LOW (ref 1.4–7.0)
Neutrophils: 27 %
Platelets: 289 x10E3/uL (ref 150–450)
RBC: 4.43 x10E6/uL (ref 3.77–5.28)
RDW: 12 % (ref 11.7–15.4)
WBC: 3.9 x10E3/uL (ref 3.4–10.8)

## 2023-12-27 LAB — CMP14+EGFR
ALT: 13 IU/L (ref 0–32)
AST: 19 IU/L (ref 0–40)
Albumin: 4.1 g/dL (ref 3.9–4.9)
Alkaline Phosphatase: 56 IU/L (ref 44–121)
BUN/Creatinine Ratio: 8 — ABNORMAL LOW (ref 9–23)
BUN: 8 mg/dL (ref 6–24)
Bilirubin Total: 0.6 mg/dL (ref 0.0–1.2)
CO2: 21 mmol/L (ref 20–29)
Calcium: 9 mg/dL (ref 8.7–10.2)
Chloride: 106 mmol/L (ref 96–106)
Creatinine, Ser: 0.98 mg/dL (ref 0.57–1.00)
Globulin, Total: 2.6 g/dL (ref 1.5–4.5)
Glucose: 88 mg/dL (ref 70–99)
Potassium: 4.6 mmol/L (ref 3.5–5.2)
Sodium: 140 mmol/L (ref 134–144)
Total Protein: 6.7 g/dL (ref 6.0–8.5)
eGFR: 74 mL/min/1.73 (ref >59)

## 2023-12-27 LAB — TSH+FREE T4
Free T4: 1.3 ng/dL (ref 0.82–1.77)
TSH: 2.9 u[IU]/mL (ref 0.450–4.500)

## 2023-12-27 LAB — HEMOGLOBIN A1C
Est. average glucose Bld gHb Est-mCnc: 108 mg/dL
Hgb A1c MFr Bld: 5.4 % (ref 4.8–5.6)

## 2023-12-27 LAB — HEPATITIS C ANTIBODY: Hep C Virus Ab: NONREACTIVE

## 2023-12-27 LAB — VITAMIN D 25 HYDROXY (VIT D DEFICIENCY, FRACTURES): Vit D, 25-Hydroxy: 32.6 ng/mL (ref 30.0–100.0)

## 2023-12-27 NOTE — Addendum Note (Signed)
 Addended by: TERRY WILHELMENA LLOYD HILARIO on: 12/27/2023 02:21 PM   Modules accepted: Orders

## 2024-01-08 NOTE — Telephone Encounter (Signed)
 Please inform the patient that their neutrophil level of 1.1 is borderline and not concerning at this time

## 2024-01-09 ENCOUNTER — Inpatient Hospital Stay: Attending: Hematology and Oncology | Admitting: Hematology and Oncology

## 2024-01-09 VITALS — BP 106/60 | HR 64 | Temp 97.8°F | Resp 18 | Ht 60.0 in | Wt 209.3 lb

## 2024-01-09 DIAGNOSIS — Z79810 Long term (current) use of selective estrogen receptor modulators (SERMs): Secondary | ICD-10-CM | POA: Diagnosis not present

## 2024-01-09 DIAGNOSIS — Z17411 Hormone receptor positive with human epidermal growth factor receptor 2 negative status: Secondary | ICD-10-CM | POA: Insufficient documentation

## 2024-01-09 DIAGNOSIS — Z170 Estrogen receptor positive status [ER+]: Secondary | ICD-10-CM

## 2024-01-09 DIAGNOSIS — C50511 Malignant neoplasm of lower-outer quadrant of right female breast: Secondary | ICD-10-CM

## 2024-01-09 DIAGNOSIS — Z79899 Other long term (current) drug therapy: Secondary | ICD-10-CM | POA: Insufficient documentation

## 2024-01-09 DIAGNOSIS — C50411 Malignant neoplasm of upper-outer quadrant of right female breast: Secondary | ICD-10-CM | POA: Insufficient documentation

## 2024-01-09 NOTE — Assessment & Plan Note (Signed)
 12/22/2018: Bilateral mastectomies:benign left breast, and in the right breast, invasive ductal carcinoma with DCIS, 3cm, clear margins, and one negative right axillary lymph node.    Oncotype DX score 18: Distant recurrence at 9 years: 5%   Current treatment: Adjuvant antiestrogen therapy with tamoxifen  started 01/29/2019 stopped for 3 months and resumed January 2022 Tamoxifen  toxicities: Occasional hot flashes and night sweats but tolerating it well I encouraged her to stay compliant on tamoxifen .  Unfortunately she takes a lot of medications and therefore she gets tired of taking medicines.   Breast cancer surveillance: 1.  Chest exam 01/09/2023: Benign 2. no role of imaging since she had bilateral mastectomies   Obesity: Patient lost 45 pounds on [place]   Return to clinic in 1 year for follow-up

## 2024-01-09 NOTE — Progress Notes (Signed)
 Patient Care Team: Del Wilhelmena Falter, Hilario, FNP as PCP - General (Family Medicine) Shaaron, Lamar HERO, MD as Consulting Physician (Gastroenterology) Odean Potts, MD as Consulting Physician (Hematology and Oncology) Dillingham, Estefana RAMAN, DO as Attending Physician (Plastic Surgery) Vernetta Berg, MD as Consulting Physician (General Surgery) Shannon Agent, MD as Consulting Physician (Radiation Oncology)  DIAGNOSIS:  Encounter Diagnosis  Name Primary?   Malignant neoplasm of lower-outer quadrant of right breast of female, estrogen receptor positive (HCC) Yes    SUMMARY OF ONCOLOGIC HISTORY: Oncology History  Breast cancer of lower-outer quadrant of right female breast (HCC)  06/17/2007 Surgery   right lumpectomy: Invasive ductal carcinoma 1.8 cm with abundant extracellular mucin 1.8 cm, grade 2, lymphovascular invasion present, margins negative, 7 lymph nodes negative, T1 cN0 stage IA   07/15/2007 - 08/27/2007 Radiation Therapy   adjuvant radiation therapy   09/25/2007 - 09/24/2009 Anti-estrogen oral therapy   Tamoxifen  with Zoladex for 2 years patient stopped it because she felt foggy in the head   01/25/2015 Surgery   breast reduction surgery: Right breast atypical ductal hyperplasia microscopic focus left breast benign   02/25/2015 -  Anti-estrogen oral therapy   tamoxifen  20 mg daily restarted   11/03/2018 Relapse/Recurrence   Patient palpated right breast changes. Mammogram showed 4 masses in the right breast measuring 1.0cm at 2 o'clock, 0.8cm at 10 o'clock, and 0.7cm and 0.5cm at 9:30, with no right axillary adenopathy. Biopsy confirmed IDC, grade 2, HER-2 - (1+), ER +100%, PR+ 100%, Ki67 30%.    11/28/2018 Genetic Testing   Negative genetic testing results on the [facility] Common Hereditary Cancers panel. 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  The Common Hereditary Gene Panel offered by [facility] includes sequencing and/or deletion  duplication testing of the following 48 genes: APC, ATM, AXIN2, BARD1, BMPR1A, BRCA1, BRCA2, BRIP1, CDH1, CDK4, CDKN2A (p14ARF), CDKN2A (p16INK4a), CHEK2, CTNNA1, DICER1, EPCAM (Deletion/duplication testing only), GREM1 (promoter region deletion/duplication testing only), KIT, MEN1, MLH1, MSH2, MSH3, MSH6, MUTYH, NBN, NF1, NHTL1, PALB2, PDGFRA, PMS2, POLD1, POLE, PTEN, RAD50, RAD51C, RAD51D, RNF43, SDHB, SDHC, SDHD, SMAD4, SMARCA4. STK11, TP53, TSC1, TSC2, and VHL.  The following genes were evaluated for sequence changes only: SDHA and HOXB13 c.251G>A variant only.    12/22/2018 Surgery   Bilateral mastectomies Jan): left breast: benign; right breast: IDC with DCIS, 3cm, clear margins, and one negative right axillary lymph node.    01/23/2019 Oncotype testing   The Oncotype DX score was 18 predicting a risk of outside the breast recurrence over the next 9 years of 5% if the patient's only systemic therapy is tamoxifen  for 5 years.     Malignant neoplasm of upper-outer quadrant of right breast in female, estrogen receptor positive (HCC)  11/03/2018 Cancer Staging   Staging form: Breast, AJCC 8th Edition - Clinical stage from 11/03/2018: Stage IA (cT1b, cN0, cM0, G2, ER+, PR+, HER2-) - Signed by Crawford Morna Pickle, NP on 11/12/2018   11/03/2018 Initial Diagnosis   Palpable abnormality in the right breast status post right lower breast cancer in 2009 and bilateral reduction mammoplasty in 2016, ultrasound revealed 1 cm lobulated hypoechoic mass, additional masses 8 mm, 7 mm and 5 mm were noted.  Biopsy ([id number]) right breast 10 o'clock position: IDC grade 2, ER 100%, PR 5%, Ki-67 30%, HER-2 -1+: Biopsy 2 o'clock position: Fat necrosis   11/28/2018 Genetic Testing   Negative genetic testing results on the [facility] Common Hereditary Cancers panel. A variant of uncertain  significance (VUS) was identified in the NBN gene (c.2198A>G).  The Common Hereditary Gene Panel offered by [facility] includes  sequencing and/or deletion duplication testing of the following 48 genes: APC, ATM, AXIN2, BARD1, BMPR1A, BRCA1, BRCA2, BRIP1, CDH1, CDK4, CDKN2A (p14ARF), CDKN2A (p16INK4a), CHEK2, CTNNA1, DICER1, EPCAM (Deletion/duplication testing only), GREM1 (promoter region deletion/duplication testing only), KIT, MEN1, MLH1, MSH2, MSH3, MSH6, MUTYH, NBN, NF1, NHTL1, PALB2, PDGFRA, PMS2, POLD1, POLE, PTEN, RAD50, RAD51C, RAD51D, RNF43, SDHB, SDHC, SDHD, SMAD4, SMARCA4. STK11, TP53, TSC1, TSC2, and VHL.  The following genes were evaluated for sequence changes only: SDHA and HOXB13 c.251G>A variant only.    12/22/2018 Surgery   Bilateral mastectomies ANCEL Poli) [phone]): Benign left breast, and in the right breast, invasive ductal carcinoma with DCIS, 3cm, clear margins, and one negative right axillary lymph node.    12/31/2018 Cancer Staging   Staging form: Breast, AJCC 8th Edition - Pathologic stage from 12/31/2018: Stage IA (pT2, pN0, cM0, G2, ER+, PR+, HER2-) - Signed by Odean Potts, MD on 12/31/2018   01/23/2019 Oncotype testing   The Oncotype DX score was 18 predicting a risk of outside the breast recurrence over the next 9 years of 5% if the patient's only systemic therapy is tamoxifen  for 5 years.     01/2019 - 01/2029 Anti-estrogen oral therapy   Tamoxifen  20 mg   04/06/2019 Surgery   Reconstruction ([place])     CHIEF COMPLIANT: Follow-up on tamoxifen  therapy  HISTORY OF PRESENT ILLNESS: History of Present Illness Adriana Moon is a 43 year old female who presents for follow-up after significant weight loss and to discuss medication adherence.  She has lost a significant amount of weight, from 298 pounds to 209 pounds, and feels healthier. She has a history of breast cancer and has undergone a double mastectomy. She experiences dyspnea without exertion, which is a new symptom.  She is currently taking tamoxifen , Lexapro , inhalers, and vitamins. She has been inconsistent  with tamoxifen  due to side effects, describing it as making her feel 'out of sorts' and 'weird.' She resumed her medications about a week ago after a period of noncompliance. She also takes Zyrtec , Revonto, and Nexium as needed. Lexapro  has been used intermittently over the past five years.     ALLERGIES:  is allergic to nsaids, prednisone , dust mite extract, other, pollen extract, percocet [oxycodone -acetaminophen ], and shellfish allergy.  MEDICATIONS:  Current Outpatient Medications  Medication Sig Dispense Refill   albuterol  (VENTOLIN  HFA) 108 (90 Base) MCG/ACT inhaler Inhale 2 puffs into the lungs every 6 (six) hours as needed for wheezing or shortness of breath. 3 each 2   cetirizine  (ZYRTEC ) 10 MG tablet Take 1-2 tablets 1-2 times a day as needed 180 tablet 3   dicyclomine  (BENTYL ) 20 MG tablet Take 1 tablet (20 mg total) by mouth 2 (two) times daily. 20 tablet 0   escitalopram  (LEXAPRO ) 10 MG tablet Take 1 tablet (10 mg total) by mouth daily. 90 tablet 1   esomeprazole (NEXIUM) 40 MG capsule Take 40 mg by mouth.     fluticasone  (FLONASE ) 50 MCG/ACT nasal spray Place 2 sprays into both nostrils daily. 16 g 6   Fluticasone -Umeclidin-Vilant (TRELEGY ELLIPTA ) 100-62.5-25 MCG/ACT AEPB Inhale 1 puff into the lungs daily. 60 each 3   Spacer/Aero Chamber Mouthpiece MISC 1 each by Does not apply route every 6 (six) hours as needed. 1 each 0   sucralfate (CARAFATE) 1 g tablet Take 1 g by mouth.     tamoxifen  (NOLVADEX ) 10 MG tablet  Take 1 tablet (10 mg total) by mouth 2 (two) times daily. 180 tablet 3   Vitamin D , Ergocalciferol , (DRISDOL ) 1.25 MG (50000 UNIT) CAPS capsule Take 1 capsule (50,000 Units total) by mouth every 7 (seven) days. 12 capsule 2   No current facility-administered medications for this visit.    PHYSICAL EXAMINATION: ECOG PERFORMANCE STATUS: 1 - Symptomatic but completely ambulatory  Vitals:   01/09/24 1513  BP: 106/60  Pulse: 64  Resp: 18  Temp: 97.8 F (36.6 C)   SpO2: 97%   Filed Weights   01/09/24 1513  Weight: 209 lb 4.8 oz (94.9 kg)    Physical Exam MEASUREMENTS: Weight- 209.  (exam performed in the presence of a chaperone)  LABORATORY DATA:  I have reviewed the data as listed    Latest Ref Rng & Units 12/26/2023    9:08 AM 03/19/2023    9:44 AM 07/17/2022    4:49 AM  CMP  Glucose 70 - 99 mg/dL 88  86  98   BUN 6 - 24 mg/dL 8  7  11    Creatinine 0.57 - 1.00 mg/dL 9.01  9.02  9.04   Sodium 134 - 144 mmol/L 140  139  138   Potassium 3.5 - 5.2 mmol/L 4.6  3.9  3.9   Chloride 96 - 106 mmol/L 106  105  110   CO2 20 - 29 mmol/L 21  22  20    Calcium 8.7 - 10.2 mg/dL 9.0  9.1  8.4   Total Protein 6.0 - 8.5 g/dL 6.7  7.7  6.6   Total Bilirubin 0.0 - 1.2 mg/dL 0.6  0.6  0.5   Alkaline Phos 44 - 121 IU/L 56  47  30   AST 0 - 40 IU/L 19  20  40   ALT 0 - 32 IU/L 13  15  25      Lab Results  Component Value Date   WBC 3.9 12/26/2023   HGB 12.5 12/26/2023   HCT 39.3 12/26/2023   MCV 89 12/26/2023   PLT 289 12/26/2023   NEUTROABS 1.1 (L) 12/26/2023    ASSESSMENT & PLAN:  Breast cancer of lower-outer quadrant of right female breast (HCC) 12/22/2018: Bilateral mastectomies:benign left breast, and in the right breast, invasive ductal carcinoma with DCIS, 3cm, clear margins, and one negative right axillary lymph node.    Oncotype DX score 18: Distant recurrence at 9 years: 5%   Current treatment: Adjuvant antiestrogen therapy with tamoxifen  started 01/29/2019 stopped for 3 months and resumed January 2022 Tamoxifen  toxicities: Occasional hot flashes and night sweats  She does not like taking tamoxifen  even the 10 mg dose.  So I encouraged her to take a 5 mg dose.   Breast cancer surveillance: 1.  Chest exam 01/09/2023: Benign 2. no role of imaging since she had bilateral mastectomies   Obesity: Patient lost 90 pounds on [place]   Return to clinic in 1 year for follow-up   No orders of the defined types were placed in this  encounter.  The patient has a good understanding of the overall plan. she agrees with it. she will call with any problems that may develop before the next visit here. Total time spent: 30 mins including face to face time and time spent for planning, charting and co-ordination of care   Naomi MARLA Chad, MD 01/09/24

## 2024-01-10 ENCOUNTER — Other Ambulatory Visit (HOSPITAL_COMMUNITY): Payer: Self-pay

## 2024-01-10 ENCOUNTER — Telehealth: Payer: Self-pay | Admitting: Pharmacy Technician

## 2024-01-10 NOTE — Telephone Encounter (Signed)
 Pharmacy Patient Advocate Encounter   Received notification from CoverMyMeds that prior authorization for Albuterol  Sulfate HFA 108 (90 Base)MCG/ACT aerosol is required/requested.   Insurance verification completed.   The patient is insured through [facility] .   Per test claim: Refill too soon. PA is not needed at this time. Medication was filled 01/10/2024. Next eligible fill date is 02/29/2024.

## 2024-01-17 ENCOUNTER — Ambulatory Visit

## 2024-01-17 NOTE — Progress Notes (Signed)
 Orthotics   Patient was present and evaluated for Custom molded foot orthotics. Patient will benefit from CFO's to provide total contact to BIL MLA's helping to balance and distribute body weight more evenly across BIL feet helping to reduce plantar pressure and pain. Orthotic will also encourage FF / RF alignment  Patient was scanned today and will return for fitting upon receipt  Patient has $3300 ded with 2297 still owing patient is aware of oop amount   Lolita Schultze CPed, CFo, CFm

## 2024-01-30 ENCOUNTER — Telehealth: Payer: Self-pay

## 2024-01-30 NOTE — Telephone Encounter (Signed)
 Orthotics are here Charges not entered Financial form signed and on file Appt needed

## 2024-02-03 ENCOUNTER — Other Ambulatory Visit: Payer: Self-pay | Admitting: Adult Health

## 2024-02-03 DIAGNOSIS — Z170 Estrogen receptor positive status [ER+]: Secondary | ICD-10-CM

## 2024-02-14 ENCOUNTER — Other Ambulatory Visit

## 2024-02-18 ENCOUNTER — Encounter (HOSPITAL_COMMUNITY): Payer: Self-pay | Admitting: *Deleted

## 2024-02-28 ENCOUNTER — Other Ambulatory Visit: Payer: Self-pay

## 2024-03-12 ENCOUNTER — Encounter: Payer: Self-pay | Admitting: Family Medicine

## 2024-03-18 ENCOUNTER — Telehealth: Payer: Self-pay | Admitting: Family Medicine

## 2024-03-18 ENCOUNTER — Telehealth: Payer: Self-pay

## 2024-03-18 ENCOUNTER — Other Ambulatory Visit: Payer: Self-pay

## 2024-03-18 DIAGNOSIS — F32A Depression, unspecified: Secondary | ICD-10-CM

## 2024-03-18 NOTE — Telephone Encounter (Signed)
 No noted indicating this increase approval, sent to on call provider

## 2024-03-18 NOTE — Telephone Encounter (Signed)
 Copied from CRM #[id number]. Topic: Clinical - Prescription Issue >> Mar 18, 2024 12:33 PM Selinda RAMAN wrote: Reason for CRM: The patient called in stating she cannot get her refill of her escitalopram  (LEXAPRO ) 10 MG tablet until 11/28 and has had to take 20MG  a day or 2 pills for awhile. She has already talked with her provider about this and her provider stated if she needs a refill on the 20MG  to call back. Please assist patient further as she doesn't know what else to do.  [id number] - [place], [place] - [address]  Phone: [phone] Fax: [phone]   Please assist patient further and please contact her if this can be done before the end of the day.

## 2024-03-18 NOTE — Telephone Encounter (Signed)
 Copied from CRM #[id number]. Topic: Referral - Question >> Mar 18, 2024 12:37 PM Selinda RAMAN wrote: Reason for CRM: The patient was referred to [facility] but she said it is just too expensive. She said they require a $200 something copay initially unless she has met her deductible. She cannot afford that and wonders if there is somewhere else in network that does not require that. Please assist patient further

## 2024-03-18 NOTE — Telephone Encounter (Signed)
 Referral placed.

## 2024-03-18 NOTE — Telephone Encounter (Signed)
 Yes  please place another referral.  Thank You!

## 2024-03-25 ENCOUNTER — Other Ambulatory Visit: Payer: Self-pay | Admitting: Adult Health

## 2024-03-25 DIAGNOSIS — C50411 Malignant neoplasm of upper-outer quadrant of right female breast: Secondary | ICD-10-CM

## 2024-04-18 ENCOUNTER — Other Ambulatory Visit: Payer: Self-pay | Admitting: Adult Health

## 2024-04-21 ENCOUNTER — Ambulatory Visit: Payer: Self-pay

## 2024-04-21 NOTE — Telephone Encounter (Signed)
 FYI Only or Action Required?: Action required by provider: request for appointment.  Patient was last seen in primary care on 12/26/2023 by Terry Wilhelmena Lloyd Hilario, FNP.  Called Nurse Triage reporting Cough.  Symptoms began a week ago.  Interventions attempted: OTC medications: DayQuil, Theraflu and Sudafed and Prescription medications: Trelegy and albuterol  inhaler.  Symptoms are: stable.  Triage Disposition: See Physician Within 24 Hours  Patient/caregiver understands and will follow disposition?: No, refuses disposition                                  1. ONSET: When did the cough begin?      About a week ago 2. SEVERITY: How bad is the cough today?      Coughing spells are worse in the morning 3. SPUTUM: Describe the color of your sputum (e.g., none, dry cough; clear, white, yellow, green)     Cough feels productive, but reports she has not been able to get any mucous up 4. HEMOPTYSIS: Are you coughing up any blood? If Yes, ask: How much? (e.g., flecks, streaks, tablespoons, etc.)     Denies 5. DIFFICULTY BREATHING: Are you having difficulty breathing? If Yes, ask: How bad is it? (e.g., mild, moderate, severe)      Denies SOB, but states chest congestion makes it feel like it is hard to get a deep breath, denies difficulty breathing at rest, patient able to speak in clear and complete sentences while on phone with this RN + no labored breathing detected Has access to Trelegy and albuterol  inhalers 6. FEVER: Do you have a fever? If Yes, ask: What is your temperature, how was it measured, and when did it start?     Reports sweats, but states she does not feel feverish  7. CARDIAC HISTORY: Do you have any history of heart disease? (e.g., heart attack, congestive heart failure)      Denies 8. LUNG HISTORY: Do you have any history of lung disease?  (e.g., pulmonary embolus, asthma, emphysema)     Asthma 10. OTHER SYMPTOMS: Do you  have any other symptoms? (e.g., runny nose, wheezing, chest pain)     Describes cough as deep and barky, nasal congestion Fever and chills have subsided  Denies chest pain/pressure, denies wheezing, denies pain when taking a deep breath    This RN advised in-person evaluation within 24 hours. No availability with PCP office this week. This RN advised UC. Patient declined and stated she wanted be be seen at her PCP office. Please advise.   Copied from CRM [id number]. Topic: Clinical - Red Word Triage >> Apr 21, 2024  4:17 PM Shanda MATSU wrote: Red Word that prompted transfer to Nurse Triage: Patient is reporting cough, congestion to the point where she is stating to have difficulty breathing, stated she has been having these symptoms since 04/15/24.  Reason for Disposition  SEVERE coughing spells (e.g., whooping sound after coughing, vomiting after coughing)  Protocols used: Cough - Acute Productive-A-AH

## 2024-04-24 ENCOUNTER — Telehealth (INDEPENDENT_AMBULATORY_CARE_PROVIDER_SITE_OTHER): Payer: Self-pay | Admitting: Family Medicine

## 2024-04-24 DIAGNOSIS — R058 Other specified cough: Secondary | ICD-10-CM | POA: Diagnosis not present

## 2024-04-24 MED ORDER — GUAIFENESIN 100 MG/5ML PO LIQD: 5.0000 mL | ORAL | 0 refills | Status: AC | PRN

## 2024-04-24 NOTE — Progress Notes (Signed)
 "  Virtual Visit via Video Note  I connected with Adriana Moon on 04/24/2024 at  8:40 AM EST by a video enabled telemedicine application and verified that I am speaking with the correct person using two identifiers.  Patient Location: Home Provider Location: Home Office  I discussed the limitations, risks, security, and privacy concerns of performing an evaluation and management service by video and the availability of in person appointments. I also discussed with the patient that there may be a patient responsible charge related to this service. The patient expressed understanding and agreed to proceed.  Subjective: PCP: Terry Wilhelmena Lloyd Hilario, FNP  Chief Complaint  Patient presents with   Cough    Persistent cough    HPI  The patient reports that she recently recovered from the flu and has since been experiencing a persistent dry cough accompanied by chest congestion. She denies any other associated symptoms. She has been taking Theraflu, Sudafed, and DayQuil for symptom relief. She notes that the cough is worst in the morning.  ROS: Per HPI Current Medications[1]  Observations/Objective: There were no vitals filed for this visit. Physical Exam Patient is well-developed, well-nourished in no acute distress.  Resting comfortably at home.  Head is normocephalic, atraumatic.  No labored breathing.  Speech is clear and coherent with logical content.  Patient is alert and oriented at baseline.   Assessment and Plan: Post-viral cough syndrome Assessment & Plan: Encouraged increased hydration and use of a humidifier. Prescribed Robitussin for cough and to help loosen phlegm. Advised to follow up if symptoms worsen or fail to improve.   Orders: -     guaiFENesin; Take 5 mLs by mouth every 4 (four) hours as needed for cough or to loosen phlegm.  Dispense: 120 mL; Refill: 0    Follow Up Instructions: No follow-ups on file.   I discussed the assessment and  treatment plan with the patient. The patient was provided an opportunity to ask questions, and all were answered. The patient agreed with the plan and demonstrated an understanding of the instructions.   The patient was advised to call back or seek an in-person evaluation if the symptoms worsen or if the condition fails to improve as anticipated.  The above assessment and management plan was discussed with the patient. The patient verbalized understanding of and has agreed to the management plan.   Jaquese Irving  Z Bacchus, FNP     [1]  Current Outpatient Medications:    guaiFENesin (ROBITUSSIN) 100 MG/5ML liquid, Take 5 mLs by mouth every 4 (four) hours as needed for cough or to loosen phlegm., Disp: 120 mL, Rfl: 0   albuterol  (VENTOLIN  HFA) 108 (90 Base) MCG/ACT inhaler, Inhale 2 puffs into the lungs every 6 (six) hours as needed for wheezing or shortness of breath., Disp: 3 each, Rfl: 2   cetirizine  (ZYRTEC ) 10 MG tablet, Take 1-2 tablets 1-2 times a day as needed, Disp: 180 tablet, Rfl: 3   dicyclomine  (BENTYL ) 20 MG tablet, Take 1 tablet (20 mg total) by mouth 2 (two) times daily., Disp: 20 tablet, Rfl: 0   escitalopram  (LEXAPRO ) 10 MG tablet, Take 1 tablet (10 mg total) by mouth daily., Disp: 90 tablet, Rfl: 1   esomeprazole (NEXIUM) 40 MG capsule, Take 40 mg by mouth., Disp: , Rfl:    fluconazole  (DIFLUCAN ) 150 MG tablet, TAKE 1 TABLET BY MOUTH NOW AND REPEAT 1 TAB IN 3 DAYS., Disp: 2 tablet, Rfl: 0   fluticasone  (FLONASE ) 50 MCG/ACT nasal spray,  Place 2 sprays into both nostrils daily., Disp: 16 g, Rfl: 6   Fluticasone -Umeclidin-Vilant (TRELEGY ELLIPTA ) 100-62.5-25 MCG/ACT AEPB, Inhale 1 puff into the lungs daily., Disp: 60 each, Rfl: 3   Spacer/Aero Chamber Mouthpiece MISC, 1 each by Does not apply route every 6 (six) hours as needed., Disp: 1 each, Rfl: 0   sucralfate (CARAFATE) 1 g tablet, Take 1 g by mouth., Disp: , Rfl:    tamoxifen  (NOLVADEX ) 10 MG tablet, Take 1 tablet by mouth twice  daily, Disp: 60 tablet, Rfl: 0   Vitamin D , Ergocalciferol , (DRISDOL ) 1.25 MG (50000 UNIT) CAPS capsule, Take 1 capsule (50,000 Units total) by mouth every 7 (seven) days., Disp: 12 capsule, Rfl: 2  "

## 2024-04-24 NOTE — Assessment & Plan Note (Signed)
 Encouraged increased hydration and use of a humidifier. Prescribed Robitussin for cough and to help loosen phlegm. Advised to follow up if symptoms worsen or fail to improve.

## 2024-05-06 ENCOUNTER — Other Ambulatory Visit (HOSPITAL_COMMUNITY)
Admission: RE | Admit: 2024-05-06 | Discharge: 2024-05-06 | Disposition: A | Discharge status: Home / Self Care | Source: Ambulatory Visit | Attending: Obstetrics & Gynecology | Admitting: Obstetrics & Gynecology

## 2024-05-06 ENCOUNTER — Ambulatory Visit

## 2024-05-06 DIAGNOSIS — N898 Other specified noninflammatory disorders of vagina: Secondary | ICD-10-CM | POA: Diagnosis present

## 2024-05-06 NOTE — Progress Notes (Signed)
" ° °  NURSE VISIT- VAGINITIS/STD  SUBJECTIVE:  Adriana Moon is a 44 y.o. [id number] GYN patientfemale here for a vaginal swab for vaginitis screening.  She reports the following symptoms: burning, discharge described as white and curd-like, and vulvar itching fo. Denies abnormal vaginal bleeding, significant pelvic pain, fever, or UTI symptoms.  OBJECTIVE:  There were no vitals taken for this visit.  Appears well, in no apparent distress  ASSESSMENT: Vaginal swab for vaginitis screening  PLAN: Self-collected vaginal probe for Gonorrhea, Chlamydia, Trichomonas, Bacterial Vaginosis, Yeast sent to lab Treatment: to be determined once results are received Follow-up as needed if symptoms persist/worsen, or new symptoms develop  Aleck FORBES Blase  05/06/2024 4:29 PM  "

## 2024-05-08 ENCOUNTER — Ambulatory Visit: Payer: Self-pay | Admitting: Adult Health

## 2024-05-08 LAB — CERVICOVAGINAL ANCILLARY ONLY
Bacterial Vaginitis (gardnerella): POSITIVE — AB
Candida Glabrata: NEGATIVE
Candida Vaginitis: POSITIVE — AB
Chlamydia: NEGATIVE
Comment: NEGATIVE
Comment: NEGATIVE
Comment: NEGATIVE
Comment: NEGATIVE
Comment: NEGATIVE
Comment: NORMAL
Neisseria Gonorrhea: NEGATIVE
Trichomonas: NEGATIVE

## 2024-05-08 MED ORDER — METRONIDAZOLE 500 MG PO TABS: 500.0000 mg | ORAL_TABLET | Freq: Two times a day (BID) | ORAL | 0 refills | Status: AC

## 2024-05-08 MED ORDER — FLUCONAZOLE 150 MG PO TABS: ORAL_TABLET | ORAL | 0 refills | Status: AC

## 2024-05-19 ENCOUNTER — Telehealth: Payer: Self-pay | Admitting: Podiatry

## 2024-05-19 NOTE — Telephone Encounter (Signed)
 Patient called requesting a handicap placard for 6 months. Please advise.

## 2024-05-22 ENCOUNTER — Other Ambulatory Visit

## 2024-09-30 ENCOUNTER — Ambulatory Visit: Payer: Self-pay | Admitting: Nurse Practitioner

## 2025-01-07 ENCOUNTER — Ambulatory Visit: Admitting: Hematology and Oncology
